# Patient Record
Sex: Female | Born: 1965 | Race: White | Hispanic: No | Marital: Married | Smoking: Current every day smoker
Health system: Southern US, Community
[De-identification: ages and names within clinical notes are randomized; demographics above are authoritative.]

## PROBLEM LIST (undated history)

## (undated) DIAGNOSIS — J45909 Unspecified asthma, uncomplicated: Secondary | ICD-10-CM

## (undated) DIAGNOSIS — M542 Cervicalgia: Secondary | ICD-10-CM

## (undated) DIAGNOSIS — Z8616 Personal history of COVID-19: Secondary | ICD-10-CM

## (undated) DIAGNOSIS — I10 Essential (primary) hypertension: Secondary | ICD-10-CM

## (undated) DIAGNOSIS — G4730 Sleep apnea, unspecified: Secondary | ICD-10-CM

## (undated) DIAGNOSIS — I509 Heart failure, unspecified: Secondary | ICD-10-CM

## (undated) DIAGNOSIS — G8929 Other chronic pain: Secondary | ICD-10-CM

## (undated) DIAGNOSIS — R519 Headache, unspecified: Secondary | ICD-10-CM

## (undated) DIAGNOSIS — K219 Gastro-esophageal reflux disease without esophagitis: Secondary | ICD-10-CM

## (undated) DIAGNOSIS — Z9981 Dependence on supplemental oxygen: Secondary | ICD-10-CM

## (undated) DIAGNOSIS — S71002A Unspecified open wound, left hip, initial encounter: Secondary | ICD-10-CM

## (undated) DIAGNOSIS — J449 Chronic obstructive pulmonary disease, unspecified: Secondary | ICD-10-CM

## (undated) DIAGNOSIS — R0602 Shortness of breath: Secondary | ICD-10-CM

## (undated) HISTORY — PX: COLONOSCOPY: SHX174

## (undated) HISTORY — DX: Headache, unspecified: R51.9

## (undated) HISTORY — DX: Personal history of COVID-19: Z86.16

## (undated) HISTORY — DX: Other chronic pain: G89.29

## (undated) HISTORY — DX: Cervicalgia: M54.2

---

## 1993-09-09 HISTORY — PX: TUBAL LIGATION: SHX77

## 2000-12-28 ENCOUNTER — Emergency Department (HOSPITAL_COMMUNITY): Admission: EM | Admit: 2000-12-28 | Discharge: 2000-12-28 | Payer: Self-pay | Admitting: Emergency Medicine

## 2000-12-28 ENCOUNTER — Encounter: Payer: Self-pay | Admitting: Emergency Medicine

## 2001-01-01 ENCOUNTER — Encounter: Payer: Self-pay | Admitting: Emergency Medicine

## 2001-01-01 ENCOUNTER — Emergency Department (HOSPITAL_COMMUNITY): Admission: EM | Admit: 2001-01-01 | Discharge: 2001-01-01 | Payer: Self-pay | Admitting: Emergency Medicine

## 2001-04-15 ENCOUNTER — Other Ambulatory Visit: Admission: RE | Admit: 2001-04-15 | Discharge: 2001-04-15 | Payer: Self-pay | Admitting: Obstetrics and Gynecology

## 2001-04-30 ENCOUNTER — Encounter: Payer: Self-pay | Admitting: General Surgery

## 2001-04-30 ENCOUNTER — Encounter: Admission: RE | Admit: 2001-04-30 | Discharge: 2001-04-30 | Payer: Self-pay | Admitting: General Surgery

## 2002-04-26 ENCOUNTER — Other Ambulatory Visit: Admission: RE | Admit: 2002-04-26 | Discharge: 2002-04-26 | Payer: Self-pay | Admitting: Obstetrics and Gynecology

## 2002-04-30 ENCOUNTER — Encounter: Payer: Self-pay | Admitting: Obstetrics and Gynecology

## 2002-04-30 ENCOUNTER — Encounter: Admission: RE | Admit: 2002-04-30 | Discharge: 2002-04-30 | Payer: Self-pay | Admitting: Obstetrics and Gynecology

## 2002-05-31 ENCOUNTER — Encounter (INDEPENDENT_AMBULATORY_CARE_PROVIDER_SITE_OTHER): Payer: Self-pay | Admitting: Specialist

## 2002-05-31 ENCOUNTER — Inpatient Hospital Stay (HOSPITAL_COMMUNITY): Admission: RE | Admit: 2002-05-31 | Discharge: 2002-06-02 | Payer: Self-pay | Admitting: Obstetrics and Gynecology

## 2002-06-09 HISTORY — PX: TOTAL ABDOMINAL HYSTERECTOMY: SHX209

## 2003-01-27 ENCOUNTER — Encounter: Admission: RE | Admit: 2003-01-27 | Discharge: 2003-01-27 | Payer: Self-pay | Admitting: Obstetrics and Gynecology

## 2003-01-27 ENCOUNTER — Encounter: Payer: Self-pay | Admitting: Obstetrics and Gynecology

## 2004-05-10 HISTORY — PX: NECK SURGERY: SHX720

## 2005-04-18 ENCOUNTER — Inpatient Hospital Stay (HOSPITAL_COMMUNITY): Admission: RE | Admit: 2005-04-18 | Discharge: 2005-04-20 | Payer: Self-pay | Admitting: Specialist

## 2005-07-18 ENCOUNTER — Encounter: Admission: RE | Admit: 2005-07-18 | Discharge: 2005-07-18 | Payer: Self-pay | Admitting: Specialist

## 2005-08-29 IMAGING — RF DG CERVICAL SPINE 2 OR 3 VIEWS
1 series · 3 of 3 positions shown · non-contrast
Comparison: none

CLINICAL DATA: Neck pain.
 CERVICAL [id number] VIEWS:
 Intraoperative lateral films reveal plate and screw fixation from C5 through C7.  Alignment appears satisfactory.  There is no gross adverse feature.  Recommend formal cervical spine series postoperatively.

[Series 1: run · 3 of 3 slices shown]
[im 1/3]
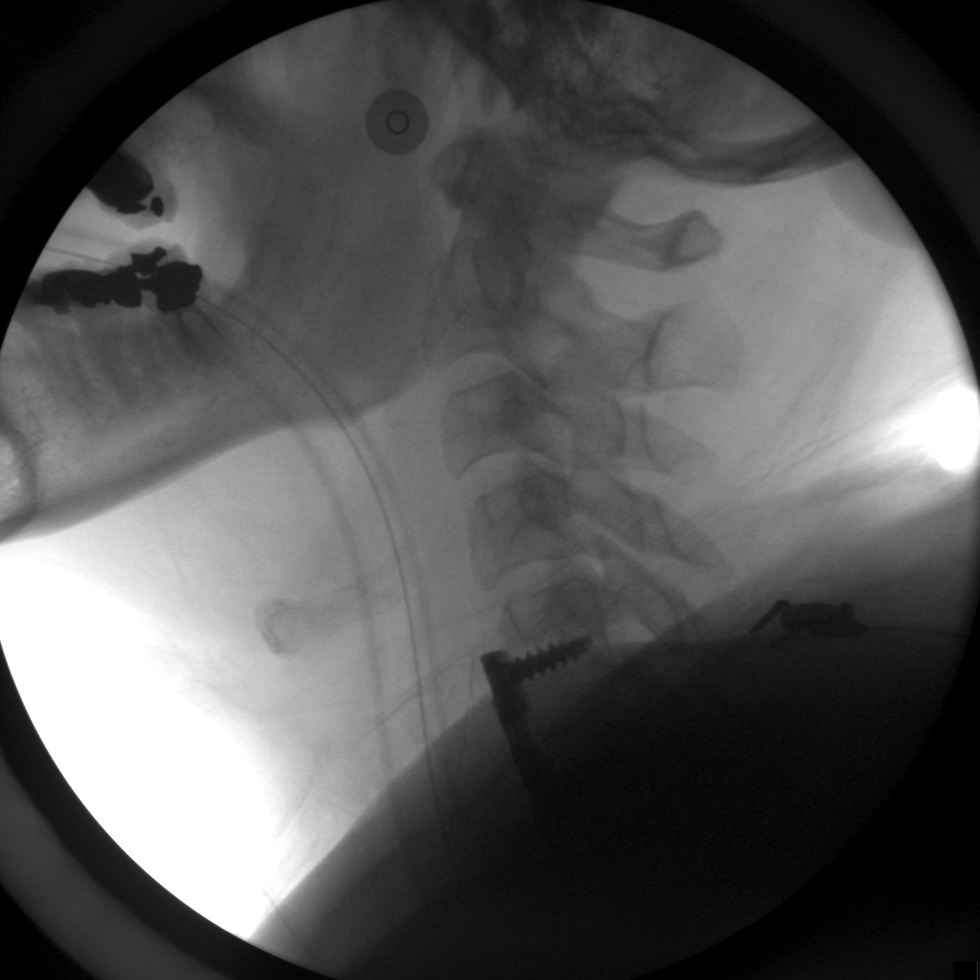
[im 2/3]
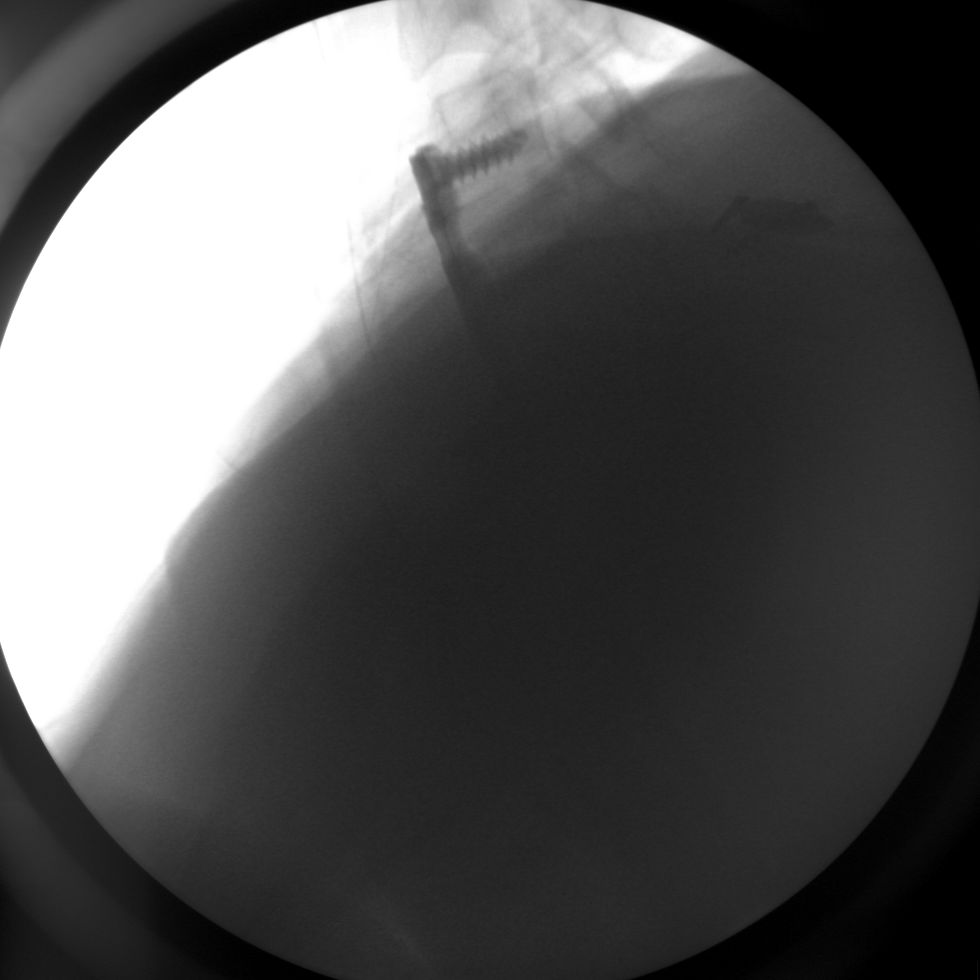
[im 3/3]
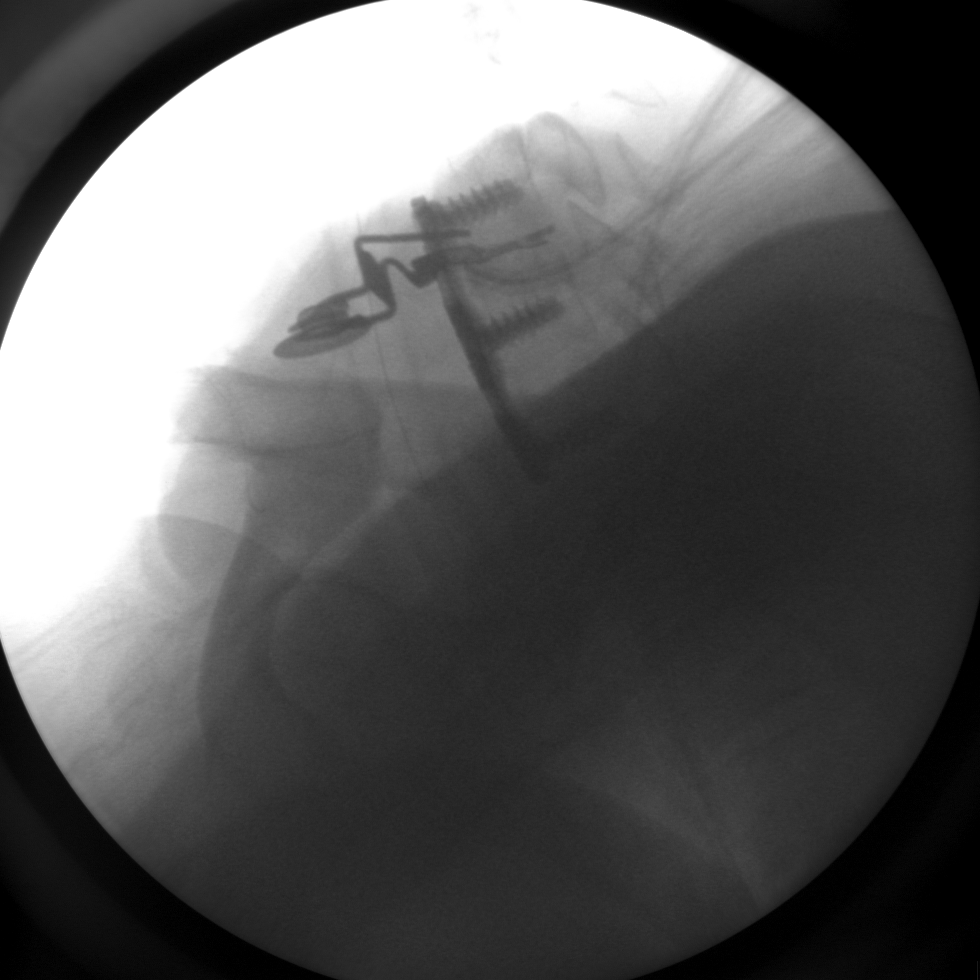

[3 of 3 positions shown; findings below may reference images not displayed]

IMPRESSION: C5-C7 fusion.

## 2006-06-06 ENCOUNTER — Encounter: Admission: RE | Admit: 2006-06-06 | Discharge: 2006-06-06 | Payer: Self-pay | Admitting: Family Medicine

## 2007-10-07 ENCOUNTER — Emergency Department (HOSPITAL_COMMUNITY): Admission: EM | Admit: 2007-10-07 | Discharge: 2007-10-07 | Payer: Self-pay | Admitting: Family Medicine

## 2010-04-30 ENCOUNTER — Encounter: Payer: Self-pay | Admitting: Pulmonary Disease

## 2010-05-09 ENCOUNTER — Ambulatory Visit: Payer: Self-pay | Admitting: Pulmonary Disease

## 2010-05-09 DIAGNOSIS — R0989 Other specified symptoms and signs involving the circulatory and respiratory systems: Secondary | ICD-10-CM

## 2010-05-09 DIAGNOSIS — R0609 Other forms of dyspnea: Secondary | ICD-10-CM

## 2010-05-09 DIAGNOSIS — J309 Allergic rhinitis, unspecified: Secondary | ICD-10-CM | POA: Insufficient documentation

## 2010-05-31 ENCOUNTER — Encounter: Payer: Self-pay | Admitting: Pulmonary Disease

## 2010-05-31 ENCOUNTER — Ambulatory Visit: Payer: Self-pay | Admitting: Pulmonary Disease

## 2010-05-31 DIAGNOSIS — J441 Chronic obstructive pulmonary disease with (acute) exacerbation: Secondary | ICD-10-CM

## 2010-10-09 NOTE — Miscellaneous (Signed)
Summary: Orders Update pft charges  Clinical Lists Changes  Orders: Added new Service order of Carbon Monoxide diffusing w/capacity (94720) - Signed Added new Service order of Lung Volumes (94240) - Signed Added new Service order of Spirometry (Pre & Post) (94060) - Signed 

## 2010-10-09 NOTE — Assessment & Plan Note (Signed)
Summary: f/u for emphysema   Copy to:  Antony Haste Primary Provider/Referring Provider:  Antony Haste  CC:  Pt here to discuss pft results and follow up on breathing. Pt states her breathing is the same. pt states she is till giving out of breath. pt c/o of productive cough in the mornings with clear phlem. Pt states she is currently smoking 1ppd. .  History of Present Illness: the pt comes in today for review of her pfts.  She was found to have moderate to severe airflow obstruction, but did have a 16%improvement with bronchodilators.  She had definite airtrapping, no restriction, and a moderate reduction in DLCO.  I have reviewed the results with her in detail, and answered all of her questions.  Unfortunately, she is still smoking.  Preventive Screening-Counseling & Management  Alcohol-Tobacco     Smoking Status: current     Smoking Cessation Counseling: yes     Packs/Day: 1.0     Tobacco Counseling: to quit use of tobacco products  Current Medications (verified): 1)  Hydrocodone-Acetaminophen 7.5-750 Mg Tabs (Hydrocodone-Acetaminophen) .Marland Kitchen.. 1 Tablet Every 4-6 Hrs As Needed 2)  Amoxicillin 500 Mg Caps (Amoxicillin) .... One Tablet Three Times A Day X7 Days  Allergies (verified): 1)  ! * Avelox 2)  ! Erythromycin  Review of Systems       The patient complains of shortness of breath with activity, productive cough, non-productive cough, acid heartburn, indigestion, and nasal congestion/difficulty breathing through nose.  The patient denies shortness of breath at rest, coughing up blood, chest pain, irregular heartbeats, loss of appetite, weight change, abdominal pain, difficulty swallowing, sore throat, tooth/dental problems, headaches, sneezing, itching, ear ache, anxiety, depression, hand/feet swelling, joint stiffness or pain, rash, change in color of mucus, and fever.    Vital Signs:  Patient profile:   45 year old female Height:      63 inches Weight:      202.38  pounds O2 Sat:      95 % on Room air Temp:     98.1 degrees F oral Pulse rate:   91 / minute BP sitting:   116 / 72  (left arm) Cuff size:   large  Vitals Entered By: Carver Fila (May 31, 2010 2:15 PM)  O2 Flow:  Room air CC: Pt here to discuss pft results and follow up on breathing. Pt states her breathing is the same. pt states she is till giving out of breath. pt c/o of productive cough in the mornings with clear phlem. Pt states she is currently smoking 1ppd.  Comments meds and allergies updated Phone number updated Carver Fila  May 31, 2010 2:17 PM    Physical Exam  General:  obese female in nad Lungs:  mildly decreased bs , no wheezing or rhonchi Heart:  rrr,no mrg Extremities:  no edema or cyanosis Neurologic:  alert and oriented, moves all 4.   Impression & Recommendations:  Problem # 1:  EMPHYSEMA (ICD-492.8) the pt has moderate to severe airflow obstruction by pfts, with a positive response to bronchodilators.  I suspect she has at least moderate emphysema, but would see significant improvement with smoking cessaton and a bronchodilator regimen.  I have had a long discussion with her about emphysema, and given her young age would like to check AAT level to be complete.  Will start with symbicort for treatment given her ongoing smoking and probable airway inflammation.  We have also discussed smoking cessation, and some of the various behavioral and  pharmacological treatments available.    Medications Added to Medication List This Visit: 1)  Hydrocodone-acetaminophen 7.5-750 Mg Tabs (Hydrocodone-acetaminophen) .Marland Kitchen.. 1 tablet every 4-6 hrs as needed 2)  Amoxicillin 500 Mg Caps (Amoxicillin) .... One tablet three times a day x7 days 3)  Symbicort 160-4.5 Mcg/act Aero (Budesonide-formoterol fumarate) .... Two puffs twice daily 4)  Proair Hfa 108 (90 Base) Mcg/act Aers (Albuterol sulfate) .... 2 puffs every 4-6 hours as needed  Other Orders: Est. Patient Level III  (54270) T- * Misc. Laboratory test [id number]) Tobacco use cessation intermediate 3-10 minutes (28315)  Patient Instructions: 1)  stop smoking!! 2)  start symbicort 160/4.5  2 inhalations am and pm...rinse mouth well. 3)  albuterol inhaler 2 inhalations up to every 6hrs if needed for worsening breathing...just use for rescue. 4)  will check bloodwork today for inherited form of emphysema. 5)  followup with me in 3 mos  Prescriptions: PROAIR HFA 108 (90 BASE) MCG/ACT  AERS (ALBUTEROL SULFATE) 2 puffs every 4-6 hours as needed  #1 x 6   Entered and Authorized by:   Barbaraann Share MD   Signed by:   Barbaraann Share MD on 05/31/2010   Method used:   Print then Give to Patient   RxID:   [id number] SYMBICORT 160-4.5 MCG/ACT  AERO (BUDESONIDE-FORMOTEROL FUMARATE) Two puffs twice daily  #1 x 6   Entered and Authorized by:   Barbaraann Share MD   Signed by:   Barbaraann Share MD on 05/31/2010   Method used:   Print then Give to Patient   RxID:   [id number]

## 2010-10-09 NOTE — Assessment & Plan Note (Signed)
Summary: consult for [name]   Copy to:  Antony Haste Primary Provider/Referring Provider:  Antony Haste  CC:  Pulmonary Consult.  History of Present Illness: The pt is a 45y/o female who I have been asked to see for dyspnea.  The pt c/o [name] for over a year, but feels that it has been stable and not progressive.  She will get winded walking up a flight of stairs, and also notes [name] at one block at moderate pace on flat ground.  She will also get winded bringing groceries in from the car, and doing light housework.  She has no issues with cough or congestion unless she gets "sick", but did recently have a URI for which she received abx and prednisone.  Unfortunately, she is a lifelong smoker, and currently smokes one ppd.  She does have the typical early am smoker's cough.  She also has gained 35 pounds over the last 2 years.  She has no known cardiac disease.  Preventive Screening-Counseling & Management  Alcohol-Tobacco     Smoking Status: current     Smoking Cessation Counseling: yes     Packs/Day: 1.0     Tobacco Counseling: to quit use of tobacco products  Comments: pt has tried patches, chantix (couldn't afford).  Current Medications (verified): 1)  Hydromet 5-1.5 Mg/27ml Syrp (Hydrocodone-Homatropine) .... Take By Mouth As Needed For Cough  Allergies (verified): 1)  ! * Avelox 2)  ! Erythromycin  Past History:  Past Medical History:   ALLERGIC RHINITIS (ICD-477.9)    Past Surgical History: neck surgery Sept 2005 hysterectomy Oct 2003 tubal ligation approx 1995   Family History: Reviewed history and no changes required. emphysema: father (COPD) mother (COPD) rheumatism: father cancer: father (lung)  Social History: Reviewed history and no changes required. Patient is a current smoker.  started at age 69.  1 ppd.  pt is married and lives with husband, dennis. pt has children. pt is a housewife.  Smoking Status:  current Packs/Day:  1.0  Review of Systems       The patient complains of shortness of breath with activity, productive cough, and non-productive cough.  The patient denies shortness of breath at rest, coughing up blood, chest pain, irregular heartbeats, acid heartburn, indigestion, loss of appetite, weight change, abdominal pain, difficulty swallowing, sore throat, tooth/dental problems, headaches, nasal congestion/difficulty breathing through nose, sneezing, itching, ear ache, anxiety, depression, hand/feet swelling, joint stiffness or pain, rash, change in color of mucus, and fever.    Vital Signs:  Patient profile:   45 year old female Height:      63 inches Weight:      202.50 pounds BMI:     36.00 O2 Sat:      95 % on Room air Temp:     98.3 degrees F oral Pulse rate:   97 / minute BP sitting:   114 / 80  (left arm) Cuff size:   regular  Vitals Entered By: Arman Filter LPN (May 09, 2010 11:14 AM)  O2 Flow:  Room air CC: Pulmonary Consult Comments Medications reviewed with patient Arman Filter LPN  May 09, 2010 11:14 AM    Physical Exam  General:  obese female in nad Eyes:  PERRLA and EOMI.   Nose:  patent without discharge Mouth:  clear, elongation of soft palate and uvula Neck:  no jvd, tmg, LN Lungs:  mildly decreased bs, no wheezing or rhonchi Heart:  rrr, no mrg Abdomen:  soft and nontender, bs+ Extremities:  no edema noted, no cyanosis  pulses intact distally Neurologic:  alert and oriented, moves all 4.   Impression & Recommendations:  Problem # 1:  DYSPNEA ON EXERTION (ICD-786.09) the pt is having [name] that I suspect is multifactorial.  She is obese and deconditioned, and has gained 35 pounds in the last 2 years.  She also has a long h/o tobacco abuse, and the question is raised whether she may have chronic obstructive disease that may benefit from treatment with bronchodilators.  I suspect she has ongoing airway inflammation related to her tobacco abuse, and not chronic obstructive disease.  But,   will not know until pfts are done.  I have counseled her on smoking cessation, and will see her back after her pfts.  I have asked her to consider chantix again, and reminded her that it is priced at exactly the cost of one pack a day of cigarettes.    Medications Added to Medication List This Visit: 1)  Hydromet 5-1.5 Mg/21ml Syrp (Hydrocodone-homatropine) .... Take by mouth as needed for cough  Other Orders: Consultation Level IV (16109) Pulmonary Referral (Pulmonary) Tobacco use cessation intermediate 3-10 minutes (60454)  Patient Instructions: 1)  stop smoking 2)  will schedule for breathing studies, and see me the same day to review.

## 2011-01-25 NOTE — Op Note (Signed)
NAME:  [name], [name]                          ACCOUNT NO.:  [id number]   MEDICAL RECORD NO.:  [id number]                   PATIENT TYPE:  INP   LOCATION:  9303                                 FACILITY:  [facility]   PHYSICIAN:  Malva Limes, MD                   DATE OF BIRTH:  [birth year]   DATE OF PROCEDURE:  05/31/2002  DATE OF DISCHARGE:                                 OPERATIVE REPORT   PREOPERATIVE DIAGNOSES:  1. Menometrorrhagia.  2. Dysmenorrhea.  3. Dyspareunia.  4. Pelvic pain.   POSTOPERATIVE DIAGNOSES:  1. Menometrorrhagia.  2. Dysmenorrhea.  3. Dyspareunia.  4. Pelvic pain.   PROCEDURE:  1. Diagnostic laparoscopy.  2. Total vaginal hysterectomy.   SURGEON:  Malva Limes, MD   ASSISTANT:  Luvenia Redden, M.D.   ANESTHESIA:  General endotracheal.   ANTIBIOTICS:  Ancef 1 g.   COMPLICATIONS:  None.   SPECIMENS:  Cervix and uterus sent to pathology.   ESTIMATED BLOOD LOSS:  150 cc.   DRAINS:  Foley to bed side drainage.   FINDINGS:  The patient had a normal appearing pelvis.  There was no evidence  of any endometriosis or pelvic adhesions.  The patient did have evidence of  past tubal ligation with the Falope ring.  Ovaries normal bilaterally.  There was one small adhesion to the left ovary which was taken down with  blunt dissection.   PROCEDURE:  The patient was taken to the operating room where she was placed  in the dorsal supine position.  A general anesthetic was administered  without complications.  She was then placed in the dorsal lithotomy  position.  She was prepped with Hibiclens and her bladder was drained with a  red rubber catheter.  She was then draped in the usual fashion for this  procedure.  A Hulka tenaculum was applied to the anterior cervical lip.  The  umbilicus was then injected with 0.25% Marcaine.  A vertical skin incision  was made.  This was carried down to the fascia.  The fascia was grasped with  Allis clamps, incised with  the scissors.  Parietoperitoneum was entered with  blunt dissection.  Sutures were placed in the fascia and the Hasson placed  in the abdominal cavity.  Carbon dioxide 3 L was insufflated.  The patient  was then placed in Trendelenburg.  A 5 mm port was placed in the suprapubic  region under direct visualization.  Examination of the abdominopelvic  contents appeared to have all normal findings.  Once the laparoscopy was  concluded the pneumoperitoneum was released and instruments removed.  Attention was then turned to the vagina where a weighted speculum was placed  in the vagina.  The cervix was injected with 1% lidocaine with epinephrine.  The posterior cul-de-sac was entered with sharp dissection.  The uterosacral  ligaments were bilaterally clamped, cut, and ligated with 2-0  Monocryl  suture.  The cervix was then circumscribed.  The bladder pillars were  bilaterally clamped, cut, and ligated with 0 Monocryl suture.  The cardinal  ligaments were bilaterally clamped, cut, and ligated with 0 Monocryl suture.  The anterior cul-de-sac was entered with sharp dissection.  The uterine  vessels were then bilaterally clamped, cut, and ligated with 0 Monocryl  suture.  Once the level of the round ligament was reached the uterus was  inverted and the triple pedicle which included the ovarian ligament,  fallopian tube, and round ligament were bilaterally clamped, cut, and the  specimen excised.  This pedicle was doubly ligated with 0 Monocryl suture.  Next, all pedicles were checked and felt to be hemostatic.  Following this  the uterosacral ligaments were reapproximated in the midline using 0  Monocryl suture.  The vaginal cuff and parietoperitoneum were then closed  using 2-0 Vicryl suture in the vertical running locking fashion.  Following  this, the abdomen was insufflated again with carbon dioxide.  The scope was  placed and pedicles all checked and felt to be hemostatic.  At this point  the  instruments were removed, pneumoperitoneum released.  The fascia was  closed with 0 Vicryl suture.  Skin was closed with 4-0 Vicryl suture.  The  patient tolerated procedure well.  She was taken to recovery room in stable  condition.  Instrument and lap counts were correct x2.                                               Malva Limes, MD    MA/MEDQ  D:  05/31/2002  T:  05/31/2002  Job:  [id number]

## 2011-01-25 NOTE — Op Note (Signed)
[name], [name] NO.:  [id number]   MEDICAL RECORD NO.:  [id number]          PATIENT TYPE:  INP   LOCATION:  2854                         FACILITY:  [facility]   PHYSICIAN:  Kerrin Champagne, M.D.   DATE OF BIRTH:  14-Jun-1966   DATE OF PROCEDURE:  04/18/2005  DATE OF DISCHARGE:                                 OPERATIVE REPORT   PREOPERATIVE DIAGNOSIS:  Herniated nucleus pulposus, central C5-6 and  herniated nucleus pulposus central left-sided C6-7.   POSTOPERATIVE DIAGNOSIS:  Degenerative disk disease C5-6 and C6-7,  spondylitic spur left side C6-7 with spurring off posterior lateral corner  of the disk space causing some left-sided C7 nerve root compression. At the  C5-6 level central disk protrusion. There was also evidence of left C6 nerve  root compression secondary to combination of disk material cartilage within  the left C6 neural foramen.   PROCEDURE:  Anterior cervical diskectomy and fusion of the C5-6 and C6-7  level using right iliac crest bone graft harvested through a separate  incision. 45 mm two level DePuy locking plate with 13 and 14 mm screws.   SURGEON:  Dr. Vira Browns.   ASSISTANT:  Maud Deed, PAC.   ANESTHESIA:  GOT Dr. Diamantina Monks.   ESTIMATED BLOOD LOSS:  50 mL.   COMPLICATIONS:  None.   DRAINS:  TLS drain anterior neck 10-French, Foley catheter straight drain  during the case was removed prior to the patient being discharged to the  recovery room.  The patient was returned to recovery room in good condition.   INTRAOPERATIVE FINDINGS:  As above.   BRIEF CLINICAL HISTORY:  This patient is a 45 year old female who has been  experiencing neck pain with radiation left arm since February of this year.  The pain is along the neck and the jaw area, then into the left shoulder.  She underwent initial attempts at conservative management eventually  underwent MRI studies which demonstrated a herniated nucleus pulposus left  side at  C6-7 and central at the C5-6 level. The cord was felt to be  performed of both levels.  Duriong attempts at management using McKenzie  exercise program, cervical traction over a period of nearly four months the  patient has persisted with arm pain, numbness, weakness in her left arm. She  is brought to the operating room to undergo a anterior diskectomy and fusion  at both C5-6 and C6-7. The patient is continuing to require narcotic  medications even as long as four months following her surgery.   DESCRIPTION OF PROCEDURE:  After adequate general anesthesia, the patient in  a supine position, small bump between the shoulder blades, neck in slight  extension with a five pounds cervical halter traction, the head on the  [place] horseshoe well padded, a bump under the right buttock, the right  iliac crest and anterior neck prepped with DuraPrep solution and draped in  the usual manner, iodine Vi-drape was used. All pressure points were well-  padded. The shoulders and arms held on the table with the skids provided  padding the arms appropriately.  The legs with TED stockings try and prevent  deep venous thrombosis. Also keeping the heels off the bed. Following  draping in the usual manner, the incision along the left side of the neck  about two fingerbreadths above the sternum in line with the patient's skin  creases were marked prior to prep with surgical marking pen. Incision  approximately 4.5 to 5 cm in length in line with the patient's skin creases  through skin and subcutaneous layers down to the platysma layer which was  incised in line with the skin incision. Small subcu vein medially was  preserved. Interval between the trachea and esophagus medially and carotid  sheath laterally was then developed. Omohyoid muscle identified was not  resected, instead retracted superiorly during procedure. Blunt dissection in  the interval between the trachea esophagus medially, carotid sheath  lateral  then developed to the anterior aspect of cervical spine. Handheld Cloward  was inserted.  All venous bleeder cauterized over the medial aspect of the  incision. The prevertebral fascia teased across the midline after incising  with electrocautery. Palpated carotid tuberosity then a spinal needle then  placed at the expected C5-6 level and below the C6-7 level. Intraoperative  plain radiograph demonstrating the C5-6 level with the needle in place C6-7  level directly beneath this felt to be well identified. Under direct  visualization using handheld Clowards then the small portion of disk was  excised from the anterior aspect of disk space at the C5-6 level and then  continuing distally removing the needle at C6-7 small portion of the disk  was excised here using a 15 blade scalpel pituitary. Handheld Cloward then  used carefully retract esophagus trachea. The medial border longus colli  muscle was carefully elevated off of the anterior aspect of cervical spine  anterior longitudinal ligament to the spine carefully debrided at the C5, C6  and C7 levels. A Boss McCullough retractor then inserted with the blades  beneath the medial border longus colli muscle at the C6-7 level. 14 mm screw  posts were then inserted the vertebral body of C6 and C7. Distraction  obtained across disk space. 15 blade scalpel used to further incise the  anterior annulus and pituitaries were then used debride disk material along  with two #24 straight micro curettes. End plates were debrided of  cartilaginous endplate material as well as degenerative disk material back  to the posterior annulus. Operating room microscope draped brought into  field, under direct visualization posterior annular fibers were resected as  was posterior longitudinal ligament.  Osteophytes noted over the left  posterior margin of the disk extending into the neural foramen affecting left C7 nerve were resected until the nerve root  was observed to be exiting  proceeding anteriorly. It was felt to be well decompressed. Endplates were  then carefully further decorticated using high-speed bur. Height of the  intervertebral disk space measured using a bone wax coated cottonoid string  at about 8 mm. The depth of Cloward depth gauge to 15 mm. Incision was made  over the right iliac crest while the radiographs were being obtained for  localization of the disk at the beginning of procedure.  Approximately 6 cm  incision was made directly over the right iliac crest about two  fingerbreadths back from the anterior superior iliac spine.  Incision  through skin and subcu layers directly down to the anterior lateral aspect  iliac crest. This was incised into the periosteum. Periosteal elevation  carried superiorly and  over the medial aspect of the crest and laterally as  well. Bleeders controlled using electrocautery. With Cobb over the medial  aspect of the crest and the [employer] laterally then 8 mm dual oscillating  saw was used to incise the crest biting its base with a curved quarter inch  osteotome, removing a horseshoe like tricortical graft. Graft 8 mm in  height, depth of nearly 13-14 mm was carefully smoothed and trimmed and  carefully curved to key into the disk space appropriately. The graft was  tapered dimensions intervertebral disk space.  When it was well shaped then  the intervertebral disk space hemostased using thrombin-soaked Gelfoam. The  spinal canal felt to be well decompressed both centrally and to the left  side right side. No further bone material or disk material remaining within  disk space could be retropulsed with graft insertion.  The graft was then  inserted impacted into place without difficulty subsetting it 1 mm beneath  the anterior cortex. The screw posts was then removed at C7 level. Bone wax  applied to bleeding screw post hole here. The [employer] retractor was  completely removed and  then replaced at C5-6 level superiorly the foot of  the blade beneath the medial border of longus colli muscle and care taken to  ensure that was not impinging on the esophagus medially. Distraction  obtained here. The screw post removed from C7 was then replaced at the C5  level using parallel guide provided. Distraction obtained across the disk  space and 15 blade scalpel used to incise the anterior annular fibers.  Disk  was then debrided  disk material as well as cartilaginous endplates inferior  aspect of C5 superior aspect of C6 using a #2 micro curette as well as  pituitary rongeurs.   The disk space was debrided back to the posterior longitudinal ligament.  This was resected using 1 and 2 mm Kerrisons debriding posterior lip  osteophytes continuing out to the left neural foramen at C6 where disk  material was noted be extruding into the neural foramen affecting the left C6 nerve root. This had a consistency of both cartilage and disk material.  It was excised using 2-3 mm Kerrisons to the left C6 nerve root was felt to  be completely free then the neural foramen. With this central portion of the  canal well debrided, of any disk material remaining the posterior  longitudinal ligament was resected to ensure that all disk material had been  completely removed. Height of the intervertebral disk space again measuring  about 80 mm, the depth of 15 mm. Additional graft was then obtained for the  right iliac crest carefully protecting medial soft tissue structures.  This  graft was obtained more posterior on the crest using a dual oscillating saw  to 8 mm. Cut across its base. Graft was then carefully tapered to dimensions  of intervertebral disk space height of 8 mm depth of approximately 13-14 mm.  Graft carefully contoured to the dimensions intervertebral disk space.  Irrigation obtained over the disk space and careful inspection demonstrated  no soft tissue remaining within the disk  space could be retropulsed with  insertion of the graft.  The endplates were carefully further decorticated  using high-speed bur and irrigation performed. Hemostasis with thrombin  soaked Gelfoam. Graft was then inserted in place and packed into place  without difficulty. Subset at 1 mm beneath the anterior cortex of the disk  space. Screw posts were then removed at  both the C5 and C6 level. Carefully  the anterior lip osteophytes at both C5-6 and C6-7 were carefully debrided  using a high-speed bur. The expected length of plate was carefully obtained  using bone wax coated cottonoid string measured at approximately 45-47 mm.  47 mm plate felt to be too long so 45 was chosen. A 5 mm longitudinal  traction was released.  Plate then carefully positioned over the anterior  left side of the cervical spine and pinned into place superiorly using  retaining pin. This was placed well beneath the expected C4-5 disk space  superiorly so that no impingement would be present. The plate was carefully  aligned. 13 mm screws were then placed at the C6 level initially, fixing the  plate on the left side using a 14 mm drill to drill hole and then place a 13  mm screw.  Two 14 mm screws were then placed at the C7 level drilling first  left side placing screw and then the right side. These obtained excellent  purchase. The 13 mm screw was then placed at the C5-C6 level on the right  side. This was done by first drilling 14 mm screw hole and then placing the  13 mm screw without difficulty. Retaining pin was then removed at the C5  level. And then 13 mm screws were placed at the right side and left side C5  level. Each of the locking screws were then carefully turned after first  ensuring that all screws were well seated within the screw holes within the  plate. Turning the screws locking the screws to the plate without difficulty each of the screws returned least 90 degrees. Intraoperative C-arm fluoro  then  used to ascertain position alignment plate and screws. No evidence  retropulsion of graft. Screws appeared to be correct length, no impingement  on the spinal canal. Overall position alignment of plates and screws and  fusion appeared to be quite good. Irrigation was then performed the incision  in the anterior neck. Careful inspection of the esophagus demonstrated no  abnormalities. There was no active bleeding present. A 10-French TLS drain  was then placed in the depth of the incision exiting over the anterior  aspect of cervical spine, right side of the incision.  Platysma layer  reapproximated with interrupted 3-0 Vicryl sutures.  Deep subcu layers  approximated interrupted 3-0 Vicryl sutures and skin closed with a running  subcu stitch of 4-0 Vicryl. TLS drain was sewn in place with a 4-0 nylon  suture.  TLS drain was then charged.  Right iliac crest bone graft harvest  site carefully hemostased using bone wax and Gelfoam. No bleeding was  apparent.  Then the periosteum reapproximated over the iliac crest #1 Vicryl  suture.  Deep subcu layers approximated with interrupted #1-0 Vicryl  sutures, more subcu layers of interrupted 3-0 Vicryl sutures and skin closed  with a running subcu stitch of 4-0 Vicryl.  Tincture benzoin Steri-Strips  applied, 4x4s fixed to the skin over the right iliac crest as well as over  the anterior neck, Hypafix tape to the skin. A Philadelphia collar applied.  The patient was then returned her bed following removal of Foley catheter  that had been placed the beginning of the case. The patient was then  reactivated, extubated, returned recovery room in satisfactory condition.  The C-arm images were obtained and were kept in the patient's chart. The  patient was then returned recovery room in satisfactory condition. Also  sponge counts were correct.      Kerrin Champagne, M.D.  Electronically Signed    JEN/MEDQ  D:  04/18/2005  T:  04/19/2005  Job:  [id number]

## 2011-01-25 NOTE — Discharge Summary (Signed)
NAMEMELLODY, MASRI NO.:  [id number]   MEDICAL RECORD NO.:  [id number]          PATIENT TYPE:  INP   LOCATION:  3031                         FACILITY:  [facility]   PHYSICIAN:  Kerrin Champagne, M.D.   DATE OF BIRTH:  [birth year]   DATE OF ADMISSION:  04/18/2005  DATE OF DISCHARGE:  04/20/2005                                 DISCHARGE SUMMARY   ADMISSION DIAGNOSES:  1.  Herniated nucleus pulposus cervical vertebrae-5-6 central and herniated      nucleus pulposus left-sided and central cervical vertebrae-6-7.  2.  Tobacco abuse.   DISCHARGE DIAGNOSIS:  1.  Degenerative disk disease cervical vertebrae-5-6 and cervical vertebrae-      6-7, spondylitic spur left side cervical vertebrae-6-7 with spurring off      posterolateral corner of the disk space causing left-sided cervical      vertebrae-7 nerve root compression. At the cervical vertebrae-5-6 level      central disk protrusion. Also evidence of left cervical vertebrae-6      nerve root compression secondary to combination of disk material and      cartilage within the left cervical vertebrae-6 neural foramen.  2.  Tobacco abuse.   PROCEDURE:  On April 18, 2005, the patient underwent anterior cervical  diskectomy and fusion C5-6 and C6-7 using right iliac crest bone graft  harvested through a separate fascial incision performed by Dr. Otelia Sergeant  assisted by Maud Deed P.A.C. under general anesthesia.   COMPLICATIONS:  None.   CONSULTATIONS:  None.   BRIEF HISTORY:  The patient is a 45 year old female with neck pain and left  arm pain for several months. Conservative management has not relieve her  discomfort. MRI studies demonstrated HNP left C6-7 and central at C5-6  level. It was felt she would require surgical intervention for definitive  treatment and was admitted for the procedure as stated above.   BRIEF HOSPITAL COURSE:  The patient tolerated the procedure under general  anesthesia without  complications. Postoperatively, neurovascular motor  function of the upper extremities was noted to be intact. The patient  required IV analgesics, weaning to p.o. analgesics for discomfort. Drain was  removed on the first postoperative day and dressing changes daily thereafter  showed no signs of infection. The patient was afebrile and vital signs were  stable through the hospital stay. She received physical therapy for  ambulation and gait training as well as occupational therapy for ADLs. Aspen  collar was used at all times with instructions to use Philadelphia collar  for showering. The patient was voiding well, had positive flatus and no  nausea or vomiting. She was discharged on April 20, 2005 under stable  condition.   PERTINENT LABORATORY VALUES:  On admission, CBC within normal limits.  Hemoglobin 15.4, hematocrit 45.1 on admission. Chemistry studies normal on  admission. Coagulation studies normal on admission. Urinalysis negative for  urinary tract infection on admission. No chest x-ray or EKG on the chart at  the time of this dictation.   PLAN:  The patient was discharged to her home. She was given prescriptions  for Percocet one to two every four to six hours as needed for pain; Robaxin  500 milligrams one every eight hours as needed for spasm; Protonix 40  milligrams once daily; Ambien 10 milligrams one p.o. q.h.s. p.r.n. sleep.  She was instructed to avoid aspirin, Advil or ibuprofen-type products. She  was also instructed to use over-the-counter stool softeners or laxatives as  needed. No overhead use of her arms. No driving or lifting. She was advised  to bathe five days from the day of surgery if there was no wound drainage.  She will utilize Production manager for showers and Aspen collar at all  other times. The patient was encouraged in smoking cessation. She will  follow up with Dr. Otelia Sergeant two weeks from the date of her discharge. She will  continue on a soft diet  until she has no sore throat and then advance diet  as tolerated. All questions encouraged and answered.   CONDITION ON DISCHARGE:  Stable.      Wende Neighbors, P.A.      Kerrin Champagne, M.D.  Electronically Signed    SMV/MEDQ  D:  07/31/2005  T:  07/31/2005  Job:  [id number]

## 2011-01-25 NOTE — Discharge Summary (Signed)
   NAME:  [name], [name]                          ACCOUNT NO.:  [id number]   MEDICAL RECORD NO.:  [id number]                   PATIENT TYPE:  INP   LOCATION:  9303                                 FACILITY:  [facility]   PHYSICIAN:  Malva Limes, M.D.                 DATE OF BIRTH:  1965-10-10   DATE OF ADMISSION:  05/31/2002  DATE OF DISCHARGE:  06/02/2002                                 DISCHARGE SUMMARY   PRINCIPAL DISCHARGE DIAGNOSES:  1. Menometrorrhagia.  2. Dyspareunia.  3. Pelvic pain.  4. Dysmenorrhea.   PRINCIPAL PROCEDURES:  1. Diagnostic laparoscopy.  2. Total vaginal hysterectomy.   HISTORY OF PRESENT ILLNESS:  The patient is a 45 year old white female G2,  P2 who presented to [facility] on May 31, 2002 for diagnostic  laparoscopy and total vaginal hysterectomy secondary to a several year  history of dysmenorrhea, menometrorrhagia, dyspareunia, and pelvic pain.  Complete description of the events which led up to this hospitalization can  be found in the dictated history and physical.  The patient underwent a  diagnostic laparoscopy followed by a total vaginal hysterectomy May 31, 2002.  The patient had no complications during the surgery.  A complete  description of the procedure can be found in the dictated operative note.  The patient's preoperative hemoglobin was 16.7, postoperative 13.7.  The  patient's pathology was pending at the time of this dictation.  The  patient's postoperative course was uncomplicated.  The patient remained  afebrile.  At the time of discharge she was ambulating without difficulty  __________  regular diet.  The patient had passed flatus prior to discharge.  The patient will be discharged to home.  She will be instructed to follow up  in the office in four weeks.  She was sent home with Tylox to take p.r.n.  She was also instructed to continue taking her home medications.                                               Malva Limes, M.D.    MA/MEDQ  D:  06/02/2002  T:  06/02/2002  Job:  [id number]

## 2011-01-25 NOTE — H&P (Signed)
NAME:  [name], [name]                          ACCOUNT NO.:  [id number]   MEDICAL RECORD NO.:  [id number]                   PATIENT TYPE:  INP   LOCATION:  9399                                 FACILITY:  [facility]   PHYSICIAN:  Malva Limes, MD                   DATE OF BIRTH:  01/28/1966   DATE OF ADMISSION:  05/31/2002  DATE OF DISCHARGE:                                HISTORY & PHYSICAL   HISTORY OF PRESENT ILLNESS:  The patient is a 45 year old white female G2 P2  who presents today for diagnostic laparoscopy followed by total vaginal  hysterectomy.  The patient is having this procedure secondary to a several-  year history of dysmenorrhea, dyspareunia, pelvic pain, and  menometrorrhagia.  The patient states that this has been worsening over the  last six months.  The patient has been unable to take oral contraceptive  pills in an effort to treat this problem secondary to her heavy smoking  history and age.  The patient has taken  Depo-Provera but refuses to take it secondary to side effects.  The patient  has also had an extensive GI workup for pelvic pain.  She has had CT scan,  ultrasound of her liver and gallbladder and pancreas, and colonoscopy by Dr.  Corinda Gubler; this has all been normal.  The patient was given the option of  laparoscopy with cryoablation of the uterus.  She asked for more definitive  therapy and therefore she will undergo a hysterectomy today.   PAST MEDICAL HISTORY:  The patient has had two vaginal births without  complications.  She has also had bilateral tubal ligation.   ALLERGIES:  She has an allergic reaction to ERYTHROMYCIN.   CURRENT MEDICATIONS:  1. Wellbutrin 150 mg p.o. b.i.d.  2. Trazodone 50 mg q.h.s. p.r.n.  3. Lexapro 10 mg q.d.  4. Dynex 90 mg.  5. Guaifenesin p.r.n.   SOCIAL HISTORY:  The patient smokes two packs a day.  She drinks alcohol  occasionally.  She denies any drug use.   FAMILY HISTORY:  Significant only for diabetes.   PHYSICAL EXAMINATION:  GENERAL:  The patient is an overweight white female  in no apparent distress.  HEENT:  Within normal limits.  LUNGS:  Clear to auscultation.  CARDIOVASCULAR:  Reveals a regular rate and rhythm without murmur.  LUNGS:  Reveal scattered wheezes.  BREASTS:  Without masses or tenderness.  There is no lymphadenopathy.  ABDOMEN:  Soft, nontender, nondistended.  There is no organomegaly.  She has  no rebound or guarding.  PELVIC:  Reveals normal external genitalia.  The vagina is without lesions  or discharge.  The cervix is parous.  The uterus is normal size and shape.  There are no adnexal masses.  EXTREMITIES:  Within normal limits.   IMPRESSION:  1. Menometrorrhagia.  2. Dysmenorrhea.  3. Pelvic pain.  4. Dyspareunia.   PLAN:  The patient is to undergo diagnostic laparoscopy followed by total  vaginal hysterectomy.                                               Malva Limes, MD    MA/MEDQ  D:  05/31/2002  T:  05/31/2002  Job:  [id number]

## 2011-03-21 ENCOUNTER — Encounter: Payer: Self-pay | Admitting: Pulmonary Disease

## 2011-03-22 ENCOUNTER — Ambulatory Visit (INDEPENDENT_AMBULATORY_CARE_PROVIDER_SITE_OTHER): Payer: BC Managed Care – PPO | Admitting: Pulmonary Disease

## 2011-03-22 ENCOUNTER — Encounter: Payer: Self-pay | Admitting: Pulmonary Disease

## 2011-03-22 VITALS — BP 140/90 | HR 106 | Temp 98.5°F | Ht 63.0 in | Wt 197.6 lb

## 2011-03-22 DIAGNOSIS — J449 Chronic obstructive pulmonary disease, unspecified: Secondary | ICD-10-CM

## 2011-03-22 MED ORDER — BUDESONIDE-FORMOTEROL FUMARATE 160-4.5 MCG/ACT IN AERO: 2.0000 | INHALATION_SPRAY | Freq: Two times a day (BID) | RESPIRATORY_TRACT | Status: DC

## 2011-03-22 MED ORDER — ALBUTEROL SULFATE HFA 108 (90 BASE) MCG/ACT IN AERS: 2.0000 | INHALATION_SPRAY | Freq: Four times a day (QID) | RESPIRATORY_TRACT | Status: DC | PRN

## 2011-03-22 MED ORDER — TIOTROPIUM BROMIDE MONOHYDRATE 18 MCG IN CAPS: 18.0000 ug | ORAL_CAPSULE | Freq: Every day | RESPIRATORY_TRACT | Status: DC

## 2011-03-22 NOTE — Progress Notes (Signed)
  Subjective:    Patient ID: [name], female    DOB: Dec 19, 1965, 45 y.o.   MRN: [id number]  HPI The pt comes in today for f/u of her known emphysema.  She has continued to have breathing issues, and has required rounds of abx and pred since her last visit.  She is continuing to smoke, and I have told her she will not improve until she stops. She has not seen a big improvement since starting on symbicort.     Review of Systems  Constitutional: Negative for fever and unexpected weight change.  HENT: Positive for postnasal drip. Negative for ear pain, nosebleeds, congestion, sore throat, rhinorrhea, sneezing, trouble swallowing, dental problem and sinus pressure.   Eyes: Negative for redness and itching.  Respiratory: Positive for cough, chest tightness, shortness of breath and wheezing.   Cardiovascular: Negative for palpitations and leg swelling.  Gastrointestinal: Negative for nausea and vomiting.  Genitourinary: Negative for dysuria.  Musculoskeletal: Negative for joint swelling.  Skin: Negative for rash.  Neurological: Positive for headaches.  Hematological: Does not bruise/bleed easily.  Psychiatric/Behavioral: Negative for dysphoric mood. The patient is not nervous/anxious.        Objective:   Physical Exam Obese female in nad Nares without discharge or purulence Chest with decreased bs, no wheezing Cor with rrr LE without edema, no cyanosis Alert, oriented, moves all 4        Assessment & Plan:

## 2011-03-22 NOTE — Patient Instructions (Signed)
Stay on symbicort Will add spiriva one inhalation each am for the next one month.  If you do not see a difference, can discontinue You must stop smoking if you want to get better. Work on weight reduction followup with me in 6mos, but call if having issues.

## 2011-03-26 ENCOUNTER — Encounter: Payer: Self-pay | Admitting: Pulmonary Disease

## 2011-03-26 NOTE — Assessment & Plan Note (Addendum)
The pt has known severe airflow obstruction, and unfortunately continues to smoke.  Will add spiriva to her symbicort, but explained to her again she will not see significant improvement until she stops smoking.  There is nothing more to do for her medically at this point, and her fate is truly in her own hands.

## 2011-07-15 ENCOUNTER — Other Ambulatory Visit: Payer: Self-pay | Admitting: Pulmonary Disease

## 2011-09-23 ENCOUNTER — Ambulatory Visit (INDEPENDENT_AMBULATORY_CARE_PROVIDER_SITE_OTHER): Payer: BC Managed Care – PPO | Admitting: Pulmonary Disease

## 2011-09-23 ENCOUNTER — Encounter: Payer: Self-pay | Admitting: Pulmonary Disease

## 2011-09-23 VITALS — BP 124/82 | HR 112 | Temp 98.2°F | Ht 63.0 in | Wt 207.6 lb

## 2011-09-23 DIAGNOSIS — J4489 Other specified chronic obstructive pulmonary disease: Secondary | ICD-10-CM

## 2011-09-23 DIAGNOSIS — J449 Chronic obstructive pulmonary disease, unspecified: Secondary | ICD-10-CM

## 2011-09-23 NOTE — Assessment & Plan Note (Signed)
The patient continues to have significant dyspnea on exertion that is secondary to her COPD and obesity with deconditioning.  Unfortunately, she continues to smoke, and I have told her again she will never improve until she stops.  She is having a hard time affording her symbicort, and wants to know about going on nebulized bronchodilators.  I have told her that we can put her on albuterol and budesonide, and she would like to check on the cost of this.  She is asking for a jury excuse letter today, but I explained we typically reserve this for patients who have chronic respiratory failure with chronic oxygen use and resting dyspnea.  She is to let me know what she decides about her bronchodilator regimen, and will followup with me in 6 months.

## 2011-09-23 NOTE — Patient Instructions (Signed)
Stay on symbicort for now, but can consider changing to albuterol and budesonide in neb treatments four times a day You need to quit smoking.  This is the key to everything for you.  There is no medicine that will do the same thing as smoking cessation  followup with me in 6mos

## 2011-09-23 NOTE — Progress Notes (Signed)
  Subjective:    Patient ID: [name], female    DOB: February 18, 1966, 46 y.o.   MRN: [id number]  HPI The patient comes in today for followup of her known COPD.  She was tried on Spiriva at the last visit, but feels it really made no difference to her breathing.  She is currently on symbicort, but is having difficulty affording this, and would like to consider going on nebulized bronchodilators.  She recently had an acute episode, and was started on albuterol as needed at home.  She currently is back to her usual baseline, but has chronic dyspnea on exertion.  Unfortunately, she continues to smoke, and I have told her this is her primary issue at this time.   Review of Systems  Constitutional: Negative for fever and unexpected weight change.  HENT: Positive for congestion, sneezing and sinus pressure. Negative for ear pain, nosebleeds, sore throat, rhinorrhea, trouble swallowing, dental problem and postnasal drip.   Eyes: Negative for redness and itching.  Respiratory: Positive for cough and shortness of breath. Negative for chest tightness and wheezing.   Cardiovascular: Negative for palpitations and leg swelling.  Gastrointestinal: Negative for nausea and vomiting.  Genitourinary: Negative for dysuria.  Musculoskeletal: Negative for joint swelling.  Skin: Negative for rash.  Neurological: Negative for headaches.  Hematological: Does not bruise/bleed easily.  Psychiatric/Behavioral: Negative for dysphoric mood. The patient is not nervous/anxious.        Objective:   Physical Exam Obese female in no acute distress Nose without purulence or discharge noted Chest with mild decrease in breath sounds, no wheezing or rhonchi Heart exam with regular rate and rhythm Lower extremities without significant edema, no cyanosis noted Alert and oriented, moves all 4 extremities.       Assessment & Plan:

## 2011-12-04 ENCOUNTER — Other Ambulatory Visit: Payer: Self-pay | Admitting: Pulmonary Disease

## 2011-12-04 MED ORDER — BUDESONIDE-FORMOTEROL FUMARATE 160-4.5 MCG/ACT IN AERO: 2.0000 | INHALATION_SPRAY | Freq: Two times a day (BID) | RESPIRATORY_TRACT | Status: AC

## 2012-02-06 ENCOUNTER — Telehealth: Payer: Self-pay | Admitting: Pulmonary Disease

## 2012-02-06 NOTE — Telephone Encounter (Signed)
KC are you okay with this? Please advise thanks

## 2012-02-06 NOTE — Telephone Encounter (Signed)
Ok with me 

## 2012-02-06 NOTE — Telephone Encounter (Signed)
ATC NA and no option to leave a msg, WCB.  

## 2012-02-07 NOTE — Telephone Encounter (Signed)
LMTCB x 1 

## 2012-02-11 NOTE — Telephone Encounter (Signed)
I spoke with pt and she is requesting to be seen by Dr. Vassie Loll since he has the first available. Please advise Dr. Vassie Loll thanks

## 2012-02-20 ENCOUNTER — Other Ambulatory Visit: Payer: Self-pay | Admitting: Specialist

## 2012-02-20 DIAGNOSIS — M549 Dorsalgia, unspecified: Secondary | ICD-10-CM

## 2012-02-24 NOTE — Telephone Encounter (Signed)
Dr. Alva please advise, thanks!  

## 2012-02-25 NOTE — Telephone Encounter (Signed)
I d/w Dr Shelle Iron - prefer if someone else sees her.

## 2012-02-25 NOTE — Telephone Encounter (Signed)
Dr. Sherene Sires, do you want to see this pt? Please advise, thanks

## 2012-02-26 NOTE — Telephone Encounter (Signed)
lmomtcb  

## 2012-02-26 NOTE — Telephone Encounter (Signed)
tammy or I can see her in meantime prn acute flare

## 2012-02-26 NOTE — Telephone Encounter (Signed)
i will be happy to see her as a second opinion and go from there.  She will need her pft's and cxr to complete the w/u and I strongly prefer we do all 3 (ov/pft's/cxr) at same ov with all meds in hand.  If not agreeable to this we should probably refer her somewhere else

## 2012-02-27 NOTE — Telephone Encounter (Signed)
LMTCB x 1 

## 2012-02-28 ENCOUNTER — Ambulatory Visit
Admission: RE | Admit: 2012-02-28 | Discharge: 2012-02-28 | Disposition: A | Discharge status: Home / Self Care | Payer: BC Managed Care – PPO | Source: Ambulatory Visit | Attending: Specialist | Admitting: Specialist

## 2012-02-28 VITALS — BP 109/66 | HR 91 | Ht 63.0 in | Wt 205.0 lb

## 2012-02-28 DIAGNOSIS — M549 Dorsalgia, unspecified: Secondary | ICD-10-CM

## 2012-02-28 MED ORDER — ONDANSETRON HCL 4 MG/2ML IJ SOLN
4.0000 mg | Freq: Once | INTRAMUSCULAR | Status: AC
  Administered 2012-02-28: 4 mg via INTRAMUSCULAR

## 2012-02-28 MED ORDER — MEPERIDINE HCL 100 MG/ML IJ SOLN
75.0000 mg | Freq: Once | INTRAMUSCULAR | Status: AC
  Administered 2012-02-28: 75 mg via INTRAMUSCULAR

## 2012-02-28 MED ORDER — DIAZEPAM 5 MG PO TABS
10.0000 mg | ORAL_TABLET | Freq: Once | ORAL | Status: AC
  Administered 2012-02-28: 5 mg via ORAL

## 2012-02-28 MED ORDER — IOHEXOL 180 MG/ML  SOLN
15.0000 mL | Freq: Once | INTRAMUSCULAR | Status: AC | PRN
  Administered 2012-02-28: 15 mL via INTRATHECAL

## 2012-02-28 NOTE — Discharge Instructions (Signed)

## 2012-02-28 NOTE — Telephone Encounter (Signed)
Lmomtcb. Per triage protocol, will sign off on msg. 

## 2012-03-06 ENCOUNTER — Telehealth: Payer: Self-pay | Admitting: Internal Medicine

## 2012-03-06 NOTE — Telephone Encounter (Signed)
Pls see phone msg from 02/06/12 for additional information -- pt had called regarding switching drs from [facility].  Dr. Vassie Loll had preferred she see someone else.  Dr. Sherene Sires ok'd with the switch.  Advised pt Dr. Sherene Sires ok with her switching to him.  Pt states she "would rather see Dr. Shelle Iron than Dr. Sherene Sires."  She would like to stay with Dr. Shelle Iron for now.    Also, pt states she needs to have lumbar/spinal surgery and will need pulmonary clearance.  She is requesting an OV as she did have one pending in July but this was cancelled as [facility] will be out of the office.  We have scheduled her to see [facility] on March 09, 2012 at 4:15 pm at which time she will discuss surgery clearance with KC.  She is aware of pending OV and verbalized understanding.

## 2012-03-09 ENCOUNTER — Ambulatory Visit (INDEPENDENT_AMBULATORY_CARE_PROVIDER_SITE_OTHER): Payer: BC Managed Care – PPO | Admitting: Pulmonary Disease

## 2012-03-09 ENCOUNTER — Encounter: Payer: Self-pay | Admitting: Pulmonary Disease

## 2012-03-09 VITALS — BP 128/80 | HR 106 | Temp 98.1°F | Ht 63.0 in | Wt 218.6 lb

## 2012-03-09 DIAGNOSIS — J449 Chronic obstructive pulmonary disease, unspecified: Secondary | ICD-10-CM

## 2012-03-09 MED ORDER — ALBUTEROL SULFATE HFA 108 (90 BASE) MCG/ACT IN AERS: 2.0000 | INHALATION_SPRAY | Freq: Four times a day (QID) | RESPIRATORY_TRACT | Status: DC | PRN

## 2012-03-09 NOTE — Progress Notes (Signed)
  Subjective:    Patient ID: [name], female    DOB: [birth year], 46 y.o.   MRN: [id number]  HPI The patient comes in today for followup of her known COPD, and also for preop pulmonary evaluation prior to back surgery.  The patient has known moderate to severe airflow obstruction by PFTs in the past, and unfortunately continues to smoke.  She is continuing on symbicort which she thinks has helped, but did not see improvement with Spiriva in the past.  She is scheduled to have back surgery upcoming.  She currently feels that her breathing is at her usual baseline, and has not had an acute exacerbation recently.  She has minimal cough, and no purulent mucus.   Review of Systems  Constitutional: Negative for fever and unexpected weight change.  HENT: Positive for postnasal drip and sinus pressure. Negative for ear pain, nosebleeds, congestion, sore throat, rhinorrhea, sneezing, trouble swallowing and dental problem.   Eyes: Negative for redness and itching.  Respiratory: Positive for cough, shortness of breath and wheezing. Negative for chest tightness.   Cardiovascular: Positive for leg swelling. Negative for palpitations.  Gastrointestinal: Negative for nausea and vomiting.  Genitourinary: Negative for dysuria.  Musculoskeletal: Negative for joint swelling.  Skin: Negative for rash.  Neurological: Negative for headaches.  Hematological: Does not bruise/bleed easily.  Psychiatric/Behavioral: Negative for dysphoric mood. The patient is not nervous/anxious.   All other systems reviewed and are negative.       Objective:   Physical Exam Obese female in no acute distress Nose without purulence or discharge noted Chest with mild decrease in breath sounds, no wheezes or rhonchi.  No crackles noted Cardiac exam with regular rate and rhythm Lower extremities without edema, no cyanosis Alert and oriented, moves all 4 extremities.        Assessment & Plan:

## 2012-03-09 NOTE — Patient Instructions (Addendum)
Continue on symbicort as you are doing  Will try tudorza one inhalation each am and pm for next 4weeks.  Can continue this is you feel it has helped you.  Quitting or decreasing smoking 2 weeks before general anesthesia greatly reduces your pulmonary risks for surgery. Get out of bed as soon as it is allowed by your surgeon, and use incentive spirometer every hour while awake.  This reduces your risk for postoperative pneumonia.  followup with me after your back surgery to make sure you are doing ok.

## 2012-03-09 NOTE — Assessment & Plan Note (Signed)
The patient has moderate to severe obstructive lung disease, but currently is near her usual baseline.  With regard to her upcoming surgery, I think that she is at moderate risk for postop pulmonary complications.  I also think her obesity is a significant risk for her as well.  I have asked her to consider stopping smoking or at least reducing her quantity 2 weeks prior to general anesthesia.  She is to stay on symbicort, but we'll add tudorza to see if this gets further improvement.  I have also discussed with her the importance of early ambulation, and the use of incentive spirometry.  She is to followup with [place] after the surgery to check and see how she is doing.

## 2012-03-17 ENCOUNTER — Telehealth: Payer: Self-pay | Admitting: Pulmonary Disease

## 2012-03-17 NOTE — Telephone Encounter (Signed)
I spoke with Cordelia Pen Dr. Barbaraann Faster nurse and they did receive the clearance. I made pt aware of this and needed nothing further

## 2012-03-17 NOTE — Telephone Encounter (Signed)
This has already been done.  Please check with ortho office to see if they have received my note.  If not, then print my last ov, fax to them, and then call to verify they received.  Thanks.

## 2012-03-17 NOTE — Telephone Encounter (Signed)
Spoke with pt. She states that [facility] cleared her for back surgery, but she is needing letter stating this sent to Dr. Otelia Sergeant. Please advise thanks!

## 2012-03-23 ENCOUNTER — Encounter (HOSPITAL_COMMUNITY): Payer: Self-pay | Admitting: Pharmacy Technician

## 2012-03-23 ENCOUNTER — Ambulatory Visit: Payer: BC Managed Care – PPO | Admitting: Pulmonary Disease

## 2012-03-31 ENCOUNTER — Encounter (HOSPITAL_COMMUNITY)
Admission: RE | Admit: 2012-03-31 | Discharge: 2012-03-31 | Disposition: A | Discharge status: Home / Self Care | Payer: BC Managed Care – PPO | Source: Ambulatory Visit | Attending: Physician Assistant | Admitting: Physician Assistant

## 2012-03-31 ENCOUNTER — Encounter (HOSPITAL_COMMUNITY): Payer: Self-pay

## 2012-03-31 ENCOUNTER — Encounter (HOSPITAL_COMMUNITY)
Admission: RE | Admit: 2012-03-31 | Discharge: 2012-03-31 | Disposition: A | Discharge status: Home / Self Care | Payer: BC Managed Care – PPO | Source: Ambulatory Visit | Attending: Specialist | Admitting: Specialist

## 2012-03-31 HISTORY — DX: Essential (primary) hypertension: I10

## 2012-03-31 HISTORY — DX: Gastro-esophageal reflux disease without esophagitis: K21.9

## 2012-03-31 HISTORY — DX: Chronic obstructive pulmonary disease, unspecified: J44.9

## 2012-03-31 HISTORY — DX: Shortness of breath: R06.02

## 2012-03-31 HISTORY — DX: Unspecified asthma, uncomplicated: J45.909

## 2012-03-31 LAB — URINALYSIS, ROUTINE W REFLEX MICROSCOPIC
Bilirubin Urine: NEGATIVE
Glucose, UA: NEGATIVE mg/dL
Hgb urine dipstick: NEGATIVE
Ketones, ur: NEGATIVE mg/dL
Leukocytes, UA: NEGATIVE
Protein, ur: NEGATIVE mg/dL
pH: 7 (ref 5.0–8.0)

## 2012-03-31 LAB — BASIC METABOLIC PANEL
BUN: 8 mg/dL (ref 6–23)
CO2: 26 mEq/L (ref 19–32)
GFR calc non Af Amer: >90 mL/min (ref >90)
Glucose, Bld: 89 mg/dL (ref 70–99)
Potassium: 3.7 mEq/L (ref 3.5–5.1)

## 2012-03-31 LAB — CBC
Hemoglobin: 16.8 g/dL — ABNORMAL HIGH (ref 12.0–15.0)
MCH: 31.4 pg (ref 26.0–34.0)
MCHC: 35.1 g/dL (ref 30.0–36.0)
MCV: 89.5 fL (ref 78.0–100.0)
RBC: 5.35 MIL/uL — ABNORMAL HIGH (ref 3.87–5.11)

## 2012-03-31 NOTE — Pre-Procedure Instructions (Signed)
19 [name]  03/31/2012   Your procedure is scheduled on:  04-06-2012  Report to [facility] at 5:30 AM.  Call this number if you have problems the morning of surgery: [phone]   Remember:   Do not eat food or drinkAfter Midnight.      Take these medicines the morning of surgery with A SIP OF WATER: inhaler as needed and instructed by MD,flonase,pain medication as needed,omeprazole(prilosec)   Do not wear jewelry, make-up or nail polish.  Do not wear lotions, powders, or perfumes. You may wear deodorant.  Do not shave 48 hours prior to surgery. Men may shave face and neck.  Do not bring valuables to the hospital.  Contacts, dentures or bridgework may not be worn into surgery.  Leave suitcase in the car. After surgery it may be brought to your room.  For patients admitted to the hospital, checkout time is 11:00 AM the day of discharge.  Marland Kitchen   Special Instructions: Incentive Spirometry - Practice and bring it with you on the day of surgery. and CHG Shower Use Special Wash: 1/2 bottle night before surgery and 1/2 bottle morning of surgery.   Please read over the following fact sheets that you were given: Pain Booklet, MRSA Information and Surgical Site Infection Prevention

## 2012-04-03 DIAGNOSIS — M48061 Spinal stenosis, lumbar region without neurogenic claudication: Secondary | ICD-10-CM | POA: Diagnosis present

## 2012-04-03 NOTE — H&P (Signed)
Patient examined and lab reviewed with Vernon PA-C. Patient was seen and examined in the preop holding area. There has been no interval  Change in this patient's exam preop  history and physical exam  Lab tests and images have been examined and reviewed.  The Risks benefits and alternative treatments have been discussed  extensively,questions answered.  The patient has elected to undergo the discussed surgical treatment.  

## 2012-04-03 NOTE — H&P (Signed)
[name] is an 46 y.o. female.   Chief Complaint: back and left leg pain HPI: no relief with conservative treatment including ESI.  Using narcotic analgesics regularly.  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    Past Medical History  Diagnosis Date  . Allergic rhinitis   . Hypertension   . COPD (chronic obstructive pulmonary disease)   . Shortness of breath   . Asthma   . GERD (gastroesophageal reflux disease)     Past Surgical History  Procedure Date  . Neck surgery Sept 2005  . Total abdominal hysterectomy Oct 2003  . Tubal ligation 1995    Family History  Problem Relation Age of Onset  . Emphysema Father   . Emphysema Mother   . Rheum arthritis Father   . Lung cancer Father    Social History:  reports that she has been smoking Cigarettes.  She has a 30 pack-year smoking history. She does not have any smokeless tobacco history on file. She reports that she drinks alcohol. She reports that she does not use illicit drugs.  Allergies:  Allergies  Allergen Reactions  . Erythromycin Rash       . Moxifloxacin Itching    Avelox     No prescriptions prior to admission    No results found for this or any previous visit (from the past 48 hour(s)). No results found.  Review of Systems  Constitutional: Negative.   Eyes: Negative.   Cardiovascular: Negative.   Gastrointestinal: Negative.   Genitourinary: Negative.   Musculoskeletal: Positive for back pain.  Skin: Negative.   Neurological: Negative.   Endo/Heme/Allergies: Negative.   Psychiatric/Behavioral: Negative.     There were no vitals taken for this visit. Physical Exam  Constitutional: She is oriented to person, place, and time. She appears well-developed and well-nourished.  HENT:  Head: Normocephalic and atraumatic.  Eyes: EOM are normal. Pupils are equal, round, and reactive to light.  Neck: Normal range of motion. Neck supple.  Cardiovascular: Normal rate and regular rhythm.     Respiratory: Effort normal and breath sounds normal.  GI: Soft. Bowel sounds are normal.  Musculoskeletal:       + SLR left.  No focal weakness left leg.  Distal pulses and sensation intact.    Neurological: She is alert and oriented to person, place, and time.  Skin: Skin is warm and dry.  Psychiatric: She has a normal mood and affect.     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

## 2012-04-05 MED ORDER — CEFAZOLIN SODIUM-DEXTROSE 2-3 GM-% IV SOLR
2.0000 g | INTRAVENOUS | Status: AC
  Administered 2012-04-06: 2 g via INTRAVENOUS
  Filled 2012-04-05: qty 50

## 2012-04-06 ENCOUNTER — Encounter (HOSPITAL_COMMUNITY): Payer: Self-pay | Admitting: Certified Registered"

## 2012-04-06 ENCOUNTER — Ambulatory Visit (HOSPITAL_COMMUNITY): Payer: BC Managed Care – PPO

## 2012-04-06 ENCOUNTER — Encounter (HOSPITAL_COMMUNITY)
Admission: RE | Disposition: A | Discharge status: Home / Self Care | Payer: Self-pay | Source: Ambulatory Visit | Attending: Specialist

## 2012-04-06 ENCOUNTER — Inpatient Hospital Stay (HOSPITAL_COMMUNITY)
Admission: RE | Admit: 2012-04-06 | Discharge: 2012-04-08 | DRG: 757 | Disposition: A | Discharge status: Home / Self Care | Payer: BC Managed Care – PPO | Source: Ambulatory Visit | Attending: Specialist | Admitting: Specialist

## 2012-04-06 ENCOUNTER — Encounter (HOSPITAL_COMMUNITY): Payer: Self-pay | Admitting: Specialist

## 2012-04-06 ENCOUNTER — Ambulatory Visit (HOSPITAL_COMMUNITY): Payer: BC Managed Care – PPO | Admitting: Certified Registered"

## 2012-04-06 DIAGNOSIS — Z01818 Encounter for other preprocedural examination: Secondary | ICD-10-CM

## 2012-04-06 DIAGNOSIS — J449 Chronic obstructive pulmonary disease, unspecified: Secondary | ICD-10-CM | POA: Diagnosis present

## 2012-04-06 DIAGNOSIS — J4489 Other specified chronic obstructive pulmonary disease: Secondary | ICD-10-CM | POA: Diagnosis present

## 2012-04-06 DIAGNOSIS — I10 Essential (primary) hypertension: Secondary | ICD-10-CM | POA: Diagnosis present

## 2012-04-06 DIAGNOSIS — M48061 Spinal stenosis, lumbar region without neurogenic claudication: Principal | ICD-10-CM | POA: Diagnosis present

## 2012-04-06 DIAGNOSIS — Z01810 Encounter for preprocedural cardiovascular examination: Secondary | ICD-10-CM

## 2012-04-06 DIAGNOSIS — F17200 Nicotine dependence, unspecified, uncomplicated: Secondary | ICD-10-CM | POA: Diagnosis present

## 2012-04-06 DIAGNOSIS — Z01812 Encounter for preprocedural laboratory examination: Secondary | ICD-10-CM

## 2012-04-06 DIAGNOSIS — K219 Gastro-esophageal reflux disease without esophagitis: Secondary | ICD-10-CM | POA: Diagnosis present

## 2012-04-06 HISTORY — PX: LUMBAR LAMINECTOMY/DECOMPRESSION MICRODISCECTOMY: SHX5026

## 2012-04-06 SURGERY — LUMBAR LAMINECTOMY/DECOMPRESSION MICRODISCECTOMY
Anesthesia: General | Site: Back | Wound class: Clean

## 2012-04-06 MED ORDER — METHOCARBAMOL 100 MG/ML IJ SOLN
500.0000 mg | Freq: Four times a day (QID) | INTRAVENOUS | Status: DC | PRN
  Administered 2012-04-06: 500 mg via INTRAVENOUS
  Filled 2012-04-06: qty 5

## 2012-04-06 MED ORDER — OXYCODONE-ACETAMINOPHEN 5-325 MG PO TABS
1.0000 | ORAL_TABLET | ORAL | Status: DC | PRN
  Administered 2012-04-06 – 2012-04-07 (×3): 2 via ORAL
  Filled 2012-04-06 (×3): qty 2

## 2012-04-06 MED ORDER — SODIUM CHLORIDE 0.9 % IV SOLN: 250.0000 mL | INTRAVENOUS | Status: DC

## 2012-04-06 MED ORDER — SORBITOL 70 % SOLN: 30.0000 mL | Freq: Every day | Status: DC | PRN

## 2012-04-06 MED ORDER — CEFAZOLIN SODIUM 1-5 GM-% IV SOLN
1.0000 g | Freq: Three times a day (TID) | INTRAVENOUS | Status: AC
  Administered 2012-04-06 (×2): 1 g via INTRAVENOUS
  Filled 2012-04-06 (×2): qty 50

## 2012-04-06 MED ORDER — PANTOPRAZOLE SODIUM 40 MG PO TBEC
40.0000 mg | DELAYED_RELEASE_TABLET | Freq: Every day | ORAL | Status: DC
  Administered 2012-04-07: 40 mg via ORAL
  Filled 2012-04-06: qty 1

## 2012-04-06 MED ORDER — FLEET ENEMA 7-19 GM/118ML RE ENEM: 1.0000 | ENEMA | Freq: Once | RECTAL | Status: AC | PRN

## 2012-04-06 MED ORDER — HYDROMORPHONE HCL PF 1 MG/ML IJ SOLN
INTRAMUSCULAR | Status: AC
  Filled 2012-04-06: qty 1

## 2012-04-06 MED ORDER — ONDANSETRON HCL 4 MG/2ML IJ SOLN
4.0000 mg | INTRAMUSCULAR | Status: DC | PRN
  Administered 2012-04-07 (×3): 4 mg via INTRAVENOUS
  Filled 2012-04-06 (×3): qty 2

## 2012-04-06 MED ORDER — LIDOCAINE HCL (CARDIAC) 20 MG/ML IV SOLN
INTRAVENOUS | Status: DC | PRN
  Administered 2012-04-06: 80 mg via INTRAVENOUS

## 2012-04-06 MED ORDER — ALBUTEROL SULFATE (5 MG/ML) 0.5% IN NEBU
2.5000 mg | INHALATION_SOLUTION | RESPIRATORY_TRACT | Status: DC
  Administered 2012-04-06 – 2012-04-07 (×7): 2.5 mg via RESPIRATORY_TRACT
  Filled 2012-04-06 (×7): qty 0.5

## 2012-04-06 MED ORDER — THROMBIN 20000 UNITS EX SOLR
CUTANEOUS | Status: AC
  Filled 2012-04-06: qty 20000

## 2012-04-06 MED ORDER — BUPIVACAINE-EPINEPHRINE (PF) 0.5% -1:200000 IJ SOLN
INTRAMUSCULAR | Status: AC
Start: 2012-04-06 — End: 2012-04-06
  Filled 2012-04-06: qty 10

## 2012-04-06 MED ORDER — LOSARTAN POTASSIUM 50 MG PO TABS
50.0000 mg | ORAL_TABLET | Freq: Every day | ORAL | Status: DC
  Administered 2012-04-07: 50 mg via ORAL
  Filled 2012-04-06 (×3): qty 1

## 2012-04-06 MED ORDER — THROMBIN 20000 UNITS EX KIT
PACK | CUTANEOUS | Status: DC | PRN
  Administered 2012-04-06: 20000 [IU] via TOPICAL

## 2012-04-06 MED ORDER — ACETAMINOPHEN 10 MG/ML IV SOLN
INTRAVENOUS | Status: DC | PRN
  Administered 2012-04-06: 1000 mg via INTRAVENOUS

## 2012-04-06 MED ORDER — ROCURONIUM BROMIDE 100 MG/10ML IV SOLN
INTRAVENOUS | Status: DC | PRN
  Administered 2012-04-06: 50 mg via INTRAVENOUS
  Administered 2012-04-06: 20 mg via INTRAVENOUS
  Administered 2012-04-06: 10 mg via INTRAVENOUS

## 2012-04-06 MED ORDER — ALBUTEROL SULFATE HFA 108 (90 BASE) MCG/ACT IN AERS
2.0000 | INHALATION_SPRAY | Freq: Four times a day (QID) | RESPIRATORY_TRACT | Status: DC | PRN
  Filled 2012-04-06: qty 6.7

## 2012-04-06 MED ORDER — HYDROMORPHONE HCL PF 1 MG/ML IJ SOLN
0.5000 mg | INTRAMUSCULAR | Status: DC | PRN
Start: 2012-04-06 — End: 2012-04-08
  Administered 2012-04-06 – 2012-04-08 (×11): 1 mg via INTRAVENOUS
  Filled 2012-04-06 (×10): qty 1

## 2012-04-06 MED ORDER — DOCUSATE SODIUM 100 MG PO CAPS
100.0000 mg | ORAL_CAPSULE | Freq: Two times a day (BID) | ORAL | Status: DC
  Administered 2012-04-06 – 2012-04-07 (×3): 100 mg via ORAL
  Filled 2012-04-06 (×5): qty 1

## 2012-04-06 MED ORDER — PROPOFOL 10 MG/ML IV EMUL
INTRAVENOUS | Status: DC | PRN
  Administered 2012-04-06: 120 mg via INTRAVENOUS

## 2012-04-06 MED ORDER — ACETAMINOPHEN 10 MG/ML IV SOLN
INTRAVENOUS | Status: AC
  Filled 2012-04-06: qty 100

## 2012-04-06 MED ORDER — FENTANYL CITRATE 0.05 MG/ML IJ SOLN
INTRAMUSCULAR | Status: DC | PRN
  Administered 2012-04-06: 50 ug via INTRAVENOUS
  Administered 2012-04-06: 150 ug via INTRAVENOUS
  Administered 2012-04-06 (×2): 50 ug via INTRAVENOUS

## 2012-04-06 MED ORDER — MIDAZOLAM HCL 5 MG/5ML IJ SOLN
INTRAMUSCULAR | Status: DC | PRN
  Administered 2012-04-06: 2 mg via INTRAVENOUS

## 2012-04-06 MED ORDER — BUDESONIDE-FORMOTEROL FUMARATE 160-4.5 MCG/ACT IN AERO
2.0000 | INHALATION_SPRAY | Freq: Two times a day (BID) | RESPIRATORY_TRACT | Status: DC
  Administered 2012-04-06 – 2012-04-08 (×4): 2 via RESPIRATORY_TRACT
  Filled 2012-04-06 (×2): qty 6

## 2012-04-06 MED ORDER — ACETAMINOPHEN 650 MG RE SUPP: 650.0000 mg | RECTAL | Status: DC | PRN

## 2012-04-06 MED ORDER — THROMBIN 20000 UNITS EX KIT
PACK | CUTANEOUS | Status: DC | PRN
  Administered 2012-04-06: 09:00:00 via TOPICAL

## 2012-04-06 MED ORDER — ONDANSETRON HCL 4 MG/2ML IJ SOLN
INTRAMUSCULAR | Status: DC | PRN
  Administered 2012-04-06: 4 mg via INTRAVENOUS

## 2012-04-06 MED ORDER — BUPIVACAINE-EPINEPHRINE 0.5% -1:200000 IJ SOLN
INTRAMUSCULAR | Status: DC | PRN
  Administered 2012-04-06: 10 mL

## 2012-04-06 MED ORDER — METHOCARBAMOL 500 MG PO TABS
500.0000 mg | ORAL_TABLET | Freq: Four times a day (QID) | ORAL | Status: DC | PRN
  Administered 2012-04-06 – 2012-04-07 (×4): 500 mg via ORAL
  Filled 2012-04-06 (×5): qty 1

## 2012-04-06 MED ORDER — HYDROMORPHONE HCL PF 1 MG/ML IJ SOLN
INTRAMUSCULAR | Status: DC | PRN
  Administered 2012-04-06: 0.5 mg via INTRAVENOUS
  Administered 2012-04-06 (×2): .25 mg via INTRAVENOUS

## 2012-04-06 MED ORDER — POLYETHYLENE GLYCOL 3350 17 G PO PACK: 17.0000 g | PACK | Freq: Every day | ORAL | Status: DC | PRN

## 2012-04-06 MED ORDER — ALUM & MAG HYDROXIDE-SIMETH 200-200-20 MG/5ML PO SUSP
30.0000 mL | Freq: Four times a day (QID) | ORAL | Status: DC | PRN
  Administered 2012-04-07: 30 mL via ORAL
  Filled 2012-04-06: qty 30

## 2012-04-06 MED ORDER — ACLIDINIUM BROMIDE 400 MCG/ACT IN AEPB: 1.0000 | INHALATION_SPRAY | Freq: Two times a day (BID) | RESPIRATORY_TRACT | Status: DC

## 2012-04-06 MED ORDER — HYDROCODONE-ACETAMINOPHEN 5-325 MG PO TABS
1.0000 | ORAL_TABLET | ORAL | Status: DC | PRN
  Administered 2012-04-07 – 2012-04-08 (×4): 2 via ORAL
  Filled 2012-04-06 (×4): qty 2

## 2012-04-06 MED ORDER — HYDROMORPHONE HCL PF 1 MG/ML IJ SOLN
0.2500 mg | INTRAMUSCULAR | Status: DC | PRN
  Administered 2012-04-06: 0.5 mg via INTRAVENOUS

## 2012-04-06 MED ORDER — SODIUM CHLORIDE 0.9 % IJ SOLN: 3.0000 mL | INTRAMUSCULAR | Status: DC | PRN

## 2012-04-06 MED ORDER — LACTATED RINGERS IV SOLN
INTRAVENOUS | Status: DC | PRN
  Administered 2012-04-06 (×2): via INTRAVENOUS

## 2012-04-06 MED ORDER — PHENYLEPHRINE HCL 10 MG/ML IJ SOLN
INTRAMUSCULAR | Status: DC | PRN
  Administered 2012-04-06 (×3): 40 ug via INTRAVENOUS

## 2012-04-06 MED ORDER — PHENOL 1.4 % MT LIQD: 1.0000 | OROMUCOSAL | Status: DC | PRN

## 2012-04-06 MED ORDER — FLUTICASONE PROPIONATE 50 MCG/ACT NA SUSP
2.0000 | Freq: Every day | NASAL | Status: DC
  Administered 2012-04-07 – 2012-04-08 (×2): 2 via NASAL
  Filled 2012-04-06: qty 16

## 2012-04-06 MED ORDER — ONDANSETRON HCL 4 MG/2ML IJ SOLN: 4.0000 mg | Freq: Once | INTRAMUSCULAR | Status: DC | PRN

## 2012-04-06 MED ORDER — MENTHOL 3 MG MT LOZG: 1.0000 | LOZENGE | OROMUCOSAL | Status: DC | PRN

## 2012-04-06 MED ORDER — NICOTINE 14 MG/24HR TD PT24
14.0000 mg | MEDICATED_PATCH | Freq: Every day | TRANSDERMAL | Status: DC | PRN
  Administered 2012-04-06: 14 mg via TRANSDERMAL
  Filled 2012-04-06 (×2): qty 1

## 2012-04-06 MED ORDER — ACETAMINOPHEN 10 MG/ML IV SOLN: 1000.0000 mg | Freq: Once | INTRAVENOUS | Status: DC

## 2012-04-06 MED ORDER — ZOLPIDEM TARTRATE 5 MG PO TABS: 5.0000 mg | ORAL_TABLET | Freq: Every evening | ORAL | Status: DC | PRN

## 2012-04-06 MED ORDER — ACETAMINOPHEN 325 MG PO TABS: 650.0000 mg | ORAL_TABLET | ORAL | Status: DC | PRN

## 2012-04-06 MED ORDER — GLYCOPYRROLATE 0.2 MG/ML IJ SOLN
INTRAMUSCULAR | Status: DC | PRN
  Administered 2012-04-06: .8 mg via INTRAVENOUS

## 2012-04-06 MED ORDER — NEOSTIGMINE METHYLSULFATE 1 MG/ML IJ SOLN
INTRAMUSCULAR | Status: DC | PRN
  Administered 2012-04-06: 5 mg via INTRAVENOUS

## 2012-04-06 MED ORDER — CHLORHEXIDINE GLUCONATE 4 % EX LIQD: 60.0000 mL | Freq: Once | CUTANEOUS | Status: DC

## 2012-04-06 MED ORDER — SODIUM CHLORIDE 0.9 % IJ SOLN
3.0000 mL | Freq: Two times a day (BID) | INTRAMUSCULAR | Status: DC
  Administered 2012-04-07: 3 mL via INTRAVENOUS

## 2012-04-06 SURGICAL SUPPLY — 57 items
ADH SKN CLS APL DERMABOND .7 (GAUZE/BANDAGES/DRESSINGS) ×1
AIRSTRIP 3X4 (GAUZE/BANDAGES/DRESSINGS) ×2 IMPLANT
BUR ROUND FLUTED 4 SOFT TCH (BURR) IMPLANT
CLOTH BEACON ORANGE TIMEOUT ST (SAFETY) ×2 IMPLANT
CORDS BIPOLAR (ELECTRODE) ×2 IMPLANT
DERMABOND ADVANCED (GAUZE/BANDAGES/DRESSINGS) ×1
DERMABOND ADVANCED .7 DNX12 (GAUZE/BANDAGES/DRESSINGS) ×1 IMPLANT
DRAPE INCISE IOBAN 66X45 STRL (DRAPES) ×1 IMPLANT
DRAPE MICROSCOPE LEICA (MISCELLANEOUS) ×2 IMPLANT
DRAPE POUCH INSTRU U-SHP 10X18 (DRAPES) ×2 IMPLANT
DRAPE PROXIMA HALF (DRAPES) ×1 IMPLANT
DRAPE SURG 17X23 STRL (DRAPES) ×8 IMPLANT
DRSG MEPILEX BORDER 4X4 (GAUZE/BANDAGES/DRESSINGS) ×1 IMPLANT
DRSG MEPILEX BORDER 4X8 (GAUZE/BANDAGES/DRESSINGS) IMPLANT
DURAPREP 26ML APPLICATOR (WOUND CARE) ×2 IMPLANT
ELECT CAUTERY BLADE 6.4 (BLADE) ×2 IMPLANT
ELECT REM PT RETURN 9FT ADLT (ELECTROSURGICAL) ×2
ELECTRODE REM PT RTRN 9FT ADLT (ELECTROSURGICAL) ×1 IMPLANT
EVACUATOR 1/8 PVC DRAIN (DRAIN) IMPLANT
GLOVE BIOGEL PI IND STRL 7.5 (GLOVE) ×1 IMPLANT
GLOVE BIOGEL PI INDICATOR 7.5 (GLOVE) ×1
GLOVE ECLIPSE 7.0 STRL STRAW (GLOVE) ×2 IMPLANT
GLOVE ECLIPSE 8.5 STRL (GLOVE) ×2 IMPLANT
GLOVE SURG 8.5 LATEX PF (GLOVE) ×2 IMPLANT
GOWN PREVENTION PLUS LG XLONG (DISPOSABLE) ×1 IMPLANT
GOWN PREVENTION PLUS XXLARGE (GOWN DISPOSABLE) ×2 IMPLANT
GOWN STRL NON-REIN LRG LVL3 (GOWN DISPOSABLE) ×4 IMPLANT
KIT BASIN OR (CUSTOM PROCEDURE TRAY) ×2 IMPLANT
KIT ROOM TURNOVER OR (KITS) ×2 IMPLANT
MANIFOLD NEPTUNE II (INSTRUMENTS) ×2 IMPLANT
NDL SPNL 18GX3.5 QUINCKE PK (NEEDLE) ×2 IMPLANT
NEEDLE 22X1 1/2 (OR ONLY) (NEEDLE) ×2 IMPLANT
NEEDLE SPNL 18GX3.5 QUINCKE PK (NEEDLE) ×4 IMPLANT
NS IRRIG 1000ML POUR BTL (IV SOLUTION) ×2 IMPLANT
PACK LAMINECTOMY ORTHO (CUSTOM PROCEDURE TRAY) ×2 IMPLANT
PAD ARMBOARD 7.5X6 YLW CONV (MISCELLANEOUS) ×4 IMPLANT
PATTIES SURGICAL .5 X.5 (GAUZE/BANDAGES/DRESSINGS) ×1 IMPLANT
PATTIES SURGICAL .75X.75 (GAUZE/BANDAGES/DRESSINGS) IMPLANT
PATTIES SURGICAL 1X1 (DISPOSABLE) IMPLANT
SET MIS RETRACTOR (KITS) ×1 IMPLANT
SPECIMEN JAR SMALL (MISCELLANEOUS) ×2 IMPLANT
SPONGE LAP 4X18 X RAY DECT (DISPOSABLE) IMPLANT
SPONGE SURGIFOAM ABS GEL 100 (HEMOSTASIS) ×1 IMPLANT
SUT VIC AB 0 CT1 27 (SUTURE) ×4
SUT VIC AB 0 CT1 27XBRD ANBCTR (SUTURE) ×2 IMPLANT
SUT VIC AB 1 CT1 27 (SUTURE) ×4
SUT VIC AB 1 CT1 27XBRD ANBCTR (SUTURE) ×2 IMPLANT
SUT VIC AB 2-0 CT1 27 (SUTURE) ×2
SUT VIC AB 2-0 CT1 TAPERPNT 27 (SUTURE) IMPLANT
SUT VICRYL 0 UR6 27IN ABS (SUTURE) IMPLANT
SUT VICRYL 4-0 PS2 18IN ABS (SUTURE) IMPLANT
SYR CONTROL 10ML LL (SYRINGE) ×2 IMPLANT
TOWEL OR 17X24 6PK STRL BLUE (TOWEL DISPOSABLE) ×2 IMPLANT
TOWEL OR 17X26 10 PK STRL BLUE (TOWEL DISPOSABLE) ×2 IMPLANT
TRAY FOLEY CATH 14FR (SET/KITS/TRAYS/PACK) ×1 IMPLANT
WATER STERILE IRR 1000ML POUR (IV SOLUTION) ×2 IMPLANT
YANKAUER SUCT BULB TIP NO VENT (SUCTIONS) ×2 IMPLANT

## 2012-04-06 NOTE — Anesthesia Preprocedure Evaluation (Addendum)
Anesthesia Evaluation  Patient identified by MRN, date of birth, ID band Patient awake    Reviewed: Allergy & Precautions, H&P , NPO status , Patient's Chart, lab work & pertinent test results  Airway Mallampati: II TM Distance: <3 FB Neck ROM: Full    Dental  (+) Teeth Intact   Pulmonary shortness of breath and with exertion, asthma , COPD COPD inhaler,  breath sounds clear to auscultation        Cardiovascular hypertension, Pt. on medications Rhythm:Regular Rate:Normal     Neuro/Psych    GI/Hepatic GERD-  Medicated and Controlled,  Endo/Other    Renal/GU      Musculoskeletal   Abdominal (+)  Abdomen: soft. Bowel sounds: normal.  Peds  Hematology   Anesthesia Other Findings   Reproductive/Obstetrics                         Anesthesia Physical Anesthesia Plan  ASA: III  Anesthesia Plan: General   Post-op Pain Management:    Induction: Intravenous  Airway Management Planned: Oral ETT  Additional Equipment:   Intra-op Plan:   Post-operative Plan: Extubation in OR  Informed Consent: I have reviewed the patients History and Physical, chart, labs and discussed the procedure including the risks, benefits and alternatives for the proposed anesthesia with the patient or authorized representative who has indicated his/her understanding and acceptance.     Plan Discussed with: CRNA and Surgeon  Anesthesia Plan Comments:         Anesthesia Quick Evaluation

## 2012-04-06 NOTE — Anesthesia Procedure Notes (Signed)
Procedure Name: Intubation Date/Time: 04/06/2012 7:14 AM Performed by: Ellin Goodie Pre-anesthesia Checklist: Patient identified, Emergency Drugs available, Suction available, Patient being monitored and Timeout performed Patient Re-evaluated:Patient Re-evaluated prior to inductionOxygen Delivery Method: Circle system utilized Preoxygenation: Pre-oxygenation with 100% oxygen Intubation Type: IV induction Ventilation: Mask ventilation without difficulty Laryngoscope Size: Mac and 4 Grade View: Grade I Tube type: Oral Tube size: 7.5 mm Number of attempts: 1 Airway Equipment and Method: Stylet Placement Confirmation: ETT inserted through vocal cords under direct vision,  positive ETCO2 and breath sounds checked- equal and bilateral Secured at: 23 cm Tube secured with: Tape Dental Injury: Teeth and Oropharynx as per pre-operative assessment

## 2012-04-06 NOTE — Brief Op Note (Addendum)
04/06/2012  9:40 AM  PATIENT:  [name]  46 y.o. 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  SURGEON:  Surgeon(s) and Role: Kerrin Champagne, MD - Primary  PHYSICIAN ASSISTANT: Maud Deed PA-C  ANESTHESIA:   local and general, Dr. Arta Bruce EBL:  Total I/O In: 1000 [I.V.:1000] Out: 200 [Urine:200]  BLOOD ADMINISTERED:none  DRAINS: none   LOCAL MEDICATIONS USED:  MARCAINE    and Amount: 10 ml  SPECIMEN:  No Specimen  DISPOSITION OF SPECIMEN:  N/A  COUNTS:  YES  TOURNIQUET:  * No tourniquets in log *  DICTATION: .Dragon Dictation  PLAN OF CARE: Admit to inpatient   PATIENT DISPOSITION:  PACU - hemodynamically stable.   Delay start of Pharmacological VTE agent (>24hrs) due to surgical blood loss or risk of bleeding: yes

## 2012-04-06 NOTE — Anesthesia Postprocedure Evaluation (Signed)
Anesthesia Post Note  Patient: [name]  Procedure(s) Performed: Procedure(s) (LRB): LUMBAR LAMINECTOMY/DECOMPRESSION MICRODISCECTOMY (N/A)  Anesthesia type: general  Patient location: PACU  Post pain: Pain level controlled  Post assessment: Patient's Cardiovascular Status Stable  Last Vitals:  Filed Vitals:   04/06/12 1045  BP: 106/73  Pulse: 89  Temp: 36.2 C  Resp: 18    Post vital signs: Reviewed and stable  Level of consciousness: sedated  Complications: No apparent anesthesia complications

## 2012-04-06 NOTE — Op Note (Addendum)
04/06/2012  9:47 AM  PATIENT:  [name]  46 y.o. 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     SURGEON:  Kerrin Champagne, MD     ASSISTANT:  Maud Deed, PA-C  (Present throughout the entire procedure and necessary for completion of procedure in a timely manner)     ANESTHESIA:  General,Dr. Arta Bruce. Supplemented with local anesthetic Marcaine 1/2% with 1-200,000 epinephrine 10 cc.  COMPLICATIONS:  None.      PROCEDURE:The patient was met in the holding area, and the appropriate left Lumbar level L3-4 and L4-5 identified and marked with "x" and my initials.The patient was then transported to OR OPERATING room #5 [facility] and was placed under general anesthesia without difficulty. The patient received appropriate preoperative antibiotic prophylaxis. Foley catheter was placed sterilely. The patient after intubation atraumatically was transferred to the operating room table, prone position, Wilson frame, sliding OR table. All pressure points were well padded. The arms in 90-90 well-padded at the elbows. Standard prep with DuraPrep solution lower dorsal spine to the mid sacral segment. Draped in the usual manner iodine Vi-Drape was used. Time-out procedure was called and correct. 2x 18-gauge spinal needle was then inserted at the expected L4 level. C-arm was draped sterilely to the field and used to identify the spinal needles positions. The needle was at the lower aspect of the lamina of L4 Skin superior to this was then infiltrated with Marcaine half percent with 1-200,000 epinephrine total of 10 cc used. An incision approximately an inch inch and a half in  length was then made through skin and subcutaneous layers in line with the left side of the expected midline just superior to the spinal needle entry point. An incision made into the left lumbosacral fascia approximately an inch in length .  Smallest dilator was then introduced into the incision site and used to carefully perform subperiosteal stripping of paralumbar muscles off of the posterior lamina of the expected L4-5 level. Successive dilators were then carried up to the 11 mm size. The depth measured off of the dilators at about 80 mm and 80 mm retractors and placed on the scaffolding for the MIS equipment and guided over dilators down to and docking on the posterior aspect of the lamina at the expected L4-5 level. This was sterilely attached to the articulating arm and upright bar which had been attached the OR table sterilely. C-arm fluoroscopy was identified the dilators and the retractors at the appropriate level L4-5. The operating room microscope sterilely draped brought into the field. Under the operating room microscope, the L4-5 interspace carefully debrided the small amount of muscle attachment here and high-speed bur used to drill the medial aspect of the inferior articular process of L4 approximately 10%. A localization lateral C-arm view was obtained with Penfield 4 in the L4-5 facet. 2 mm Kerrison then used to enter the spinal canal over the superior aspect of the L5 lamina carefully using the Kerrison to debris the attachment as a curet. Foraminotomy was then performed over the L5 nerve root. The medial 10% superior articular process of L5 and then resected using an osteotome and 2 mm Kerrison. This allowed for identification of the thecal sac. Penfield 4  was then used to carefully mobilize the thecal sac medially and the L5 nerve root identified within the lateral recess flattened over the medial aspect of the hypertrophic left L4-5 ligamentum flavum and left L4-5 facet. Carefully the lateral  aspect of the L5 nerve root was identified and a Penfield 4 was used to mobilize the nerve medially such that the herniated disc was visible with microscope.  Foraminotomies was performed over the L4 nerve root the nerve root was noted to be decompressed. The nerve root able to be retracted along the medial aspect of the L5 pedicle  at this level and bone overlying the left L5 foramen was further resected with Kerrisons..  Ligamentum flavum was further debrided superiorly to the level L4-5 disc. Had a moderate amount of further resection of the L5 lamina inferiorly was performed. With this then the disc space at L4-5 was easily visualized. Ball tip nerve probe was then able to carefully palpate the neuroforamen for L4 and L5 finding these to be well decompressed. Attention then turned to the left L3-4 level which was easily visualized with the microscope. Soft tissues debrided about the posterior aspect of the L3-4 interspace. High-speed bur and then used to carefully drill inferior 3 or 4 mm of the left side L3 inferior  lamina and on the medial aspect of the left L3 inferior articular process of 3 mm. The superior margin of the L4 lamina then carefully debrided with curette and a 2 mm kerrison then used to enter the spinal canal over the superior aspect of the L4 lamina resecting bone over the superior aspect and freeing up the attachment of ligamentum flavum here. Ligamentum flavum then debrided with the 2 mm and 3 mm Kerrisons we decompressed the L4 nerve root and the lateral recess left L3-4 decompressed using 2 and 3 mm Kerrisons sizing hypertrophic reflected ligamentum flavum extending superiorly. From was resected off the ventral aspect of the inferior margin of the L3 lamina. Hockey-stick nerve probe could then be passed out the L3 neuroforamen and the L4 neuroforamen. Bone bleeding encountered. Bone wax used to control this following this then the sac and the L5 nerve root were mobilized medially and the  L3-4 disc examined and found not to be herniated. Irrigation was carried out any bleeding controlled .  Irrigation carried careful examination demonstrated no active bleeding present. Retractors were then carefully removed  Small amount of bleeding within the soft tissue mass the laminotomy area was controlled using bipolar electrocautery. Irrigation was carried out using copious amounts of irrigant solution. All instrument were then removed. No significant active bleeding present at the time of removal. All instruments sponge counts were correct traction system was then carefully removed carefully rotating retractors with this withdrawal and only bipolar electrocautery of any small bleeders. Lumbodorsal fascia was then carefully approximated with interrupted 0 Vicryl sutures, UR 6 needle deep subcutaneous layers were approximated with interrupted 0 Vicryl sutures on UR 6 the appear subcutaneous layers approximated with interrupted 2-0 Vicryl sutures and the skin closed with a running subcutaneous stitch of 4-0 Vicryl. Dermabond was applied allowed to dry and then Mepilex bandage applied. Patient was then carefully returned to supine position on a stretcher, reactivated and extubated. He was then returned to recovery room in satisfactory condition.  Maud Deed PA-C perform the duties of assistant surgeon during this case. She was present from the beginning of the case to the end of the case assisting in transfer the patient from his stretcher to the  OR table and back to the stretcher at the end of the case. Assisted in careful retraction and suction of the laminectomy site delicate neural structures operating under the operating room microscope. She performed closure of the incision from the fascia to the skin applying the dressing.     Keondria Siever E  04/06/2012, 9:47 AM

## 2012-04-06 NOTE — Transfer of Care (Signed)
Immediate Anesthesia Transfer of Care Note  Patient: [name]  Procedure(s) Performed: Procedure(s) (LRB): LUMBAR LAMINECTOMY/DECOMPRESSION MICRODISCECTOMY (N/A)  Patient Location: PACU  Anesthesia Type: General  Level of Consciousness: awake and alert   Airway & Oxygen Therapy: Patient Spontanous Breathing  Post-op Assessment: Report given to PACU RN  Post vital signs: stable  Complications: No apparent anesthesia complications

## 2012-04-06 NOTE — H&P (Signed)
Patient was seen and examined in the preop holding area. There has been no interval  Change in this patient's exam preop  history and physical exam  Lab tests and images have been examined and reviewed.  The Risks benefits and alternative treatments have been discussed  extensively,questions answered.  The patient has elected to undergo the discussed surgical treatment. 

## 2012-04-06 NOTE — Preoperative (Signed)
Beta Blockers   Reason not to administer Beta Blockers:Not Applicable 

## 2012-04-07 ENCOUNTER — Encounter (HOSPITAL_COMMUNITY): Payer: Self-pay | Admitting: Specialist

## 2012-04-07 MED ORDER — GABAPENTIN 300 MG PO CAPS
300.0000 mg | ORAL_CAPSULE | Freq: Every day | ORAL | Status: DC
  Administered 2012-04-07: 300 mg via ORAL
  Filled 2012-04-07 (×2): qty 1

## 2012-04-07 MED ORDER — HYDROMORPHONE HCL 2 MG PO TABS
4.0000 mg | ORAL_TABLET | ORAL | Status: DC | PRN
  Administered 2012-04-07 – 2012-04-08 (×4): 4 mg via ORAL
  Filled 2012-04-07 (×4): qty 2

## 2012-04-07 MED ORDER — METHYLPREDNISOLONE 4 MG PO KIT
4.0000 mg | PACK | Freq: Three times a day (TID) | ORAL | Status: DC
  Administered 2012-04-08: 4 mg via ORAL

## 2012-04-07 MED ORDER — ALBUTEROL SULFATE (5 MG/ML) 0.5% IN NEBU
2.5000 mg | INHALATION_SOLUTION | Freq: Four times a day (QID) | RESPIRATORY_TRACT | Status: DC
  Administered 2012-04-08: 2.5 mg via RESPIRATORY_TRACT
  Filled 2012-04-07: qty 0.5

## 2012-04-07 MED ORDER — METHYLPREDNISOLONE 4 MG PO KIT: 4.0000 mg | PACK | Freq: Four times a day (QID) | ORAL | Status: DC

## 2012-04-07 MED ORDER — METHYLPREDNISOLONE 4 MG PO KIT
8.0000 mg | PACK | Freq: Every evening | ORAL | Status: AC
  Administered 2012-04-07: 8 mg via ORAL

## 2012-04-07 MED ORDER — METHYLPREDNISOLONE 4 MG PO KIT: 8.0000 mg | PACK | Freq: Every evening | ORAL | Status: DC

## 2012-04-07 MED ORDER — METHYLPREDNISOLONE 4 MG PO KIT
8.0000 mg | PACK | Freq: Every morning | ORAL | Status: AC
  Administered 2012-04-07: 8 mg via ORAL
  Filled 2012-04-07 (×2): qty 21

## 2012-04-07 MED ORDER — METHYLPREDNISOLONE 4 MG PO KIT
4.0000 mg | PACK | ORAL | Status: AC
  Administered 2012-04-07: 4 mg via ORAL

## 2012-04-07 MED ORDER — SODIUM CHLORIDE 0.9 % IV SOLN
INTRAVENOUS | Status: DC
  Administered 2012-04-07: 14:00:00 via INTRAVENOUS

## 2012-04-07 NOTE — Progress Notes (Signed)
Subjective: 1 Day Post-Op Procedure(s) (LRB): LUMBAR LAMINECTOMY/DECOMPRESSION MICRODISCECTOMY (N/A)Awake alert O x 4 c/o nausea and HAs. Wants to get out of bed.  Patient reports pain as mild.    Objective:   VITALS:  Temp:  [96.6 F (35.9 C)-99.1 F (37.3 C)] 99.1 F (37.3 C) (07/30 0531) Pulse Rate:  [86-102] 99  (07/30 0531) Resp:  [15-19] 19  (07/30 0531) BP: (92-125)/(50-97) 92/50 mmHg (07/30 0531) SpO2:  [91 %-100 %] 91 % (07/30 0531)  Neurologically intact ABD soft Sensation intact distally Intact pulses distally Dorsiflexion/Plantar flexion intact Incision: dressing C/D/I   LABS No results found for this basename: HGB:5,WBC:2,PLT:2 in the last 72 hours No results found for this basename: NA:2,K:2,CL:2,CO2:2,BUN:2,CREATININE:2,GLUCOSE:2, in the last 72 hours No results found for this basename: LABPT:2,INR:2 in the last 72 hours   Assessment/Plan: 1 Day Post-Op Procedure(s) (LRB): LUMBAR LAMINECTOMY/DECOMPRESSION MICRODISCECTOMY (N/A)  Advance diet Up with therapy Recheck later this AM after PT/OT and OOB to chair adjust narcotics, IS.  Lamarr Feenstra E 04/07/2012, 7:49 AM

## 2012-04-07 NOTE — Evaluation (Addendum)
Physical Therapy Evaluation Patient Details Name: [name] MRN: [id number] DOB: 05-May-1966 Today's Date: 04/07/2012 Time: 6578-4696 PT Time Calculation (min): 33 min  PT Assessment / Plan / Recommendation Clinical Impression  Pt is 46 y/o female admitted for s/p lumbar fusion.  Pt limited due to mild nausea this morning.  Pt will need to perform stair negotiation prior to d/c home.  Pt may need RW depending on pt progression.  Pt will benefit from acute PT services to improve overall mobility and prepare for safe d/c home with family.    PT Assessment  Patient needs continued PT services    Follow Up Recommendations  No PT follow up    Barriers to Discharge        Equipment Recommendations  None recommended by PT    Recommendations for Other Services     Frequency Min 5X/week    Precautions / Restrictions Precautions Precautions: Back Precaution Booklet Issued: Yes (comment) Precaution Comments: Reviewed 3/3 back precautions and handout given.   Pertinent Vitals/Pain 5/10 back pain      Mobility  Bed Mobility Bed Mobility: Rolling Right;Right Sidelying to Sit Rolling Right: 4: Min guard;With rail Right Sidelying to Sit: 4: Min guard;With rails Details for Bed Mobility Assistance: Minguard for safety with cues for proper technique and hand placement Transfers Transfers: Sit to Stand;Stand to Sit Sit to Stand: 4: Min guard;From bed Stand to Sit: 4: Min guard;To chair/3-in-1 Details for Transfer Assistance: Minguard for safety with cues for hand placement Ambulation/Gait Ambulation/Gait Assistance: 4: Min guard Ambulation Distance (Feet): 100 Feet Assistive device: Rolling walker Ambulation/Gait Assistance Details: Minguard for safety with cues for RW placement and upright posture Gait Pattern: Shuffle;Decreased trunk rotation Gait velocity: decrease gait speed due to pain    Exercises     PT Diagnosis: Difficulty walking;Abnormality of gait;Acute pain  PT  Problem List: Decreased activity tolerance;Decreased mobility;Decreased knowledge of use of DME;Decreased knowledge of precautions;Pain PT Treatment Interventions: DME instruction;Gait training;Stair training;Functional mobility training;Therapeutic activities;Therapeutic exercise;Patient/family education   PT Goals Acute Rehab PT Goals PT Goal Formulation: With patient Time For Goal Achievement: 04/14/12 Potential to Achieve Goals: Good Pt will Roll Supine to Right Side: with modified independence PT Goal: Rolling Supine to Right Side - Progress: Goal set today Pt will go Supine/Side to Sit: with modified independence PT Goal: Supine/Side to Sit - Progress: Goal set today Pt will go Sit to Supine/Side: with modified independence PT Goal: Sit to Supine/Side - Progress: Goal set today Pt will go Sit to Stand: with modified independence PT Goal: Sit to Stand - Progress: Goal set today Pt will go Stand to Sit: with modified independence PT Goal: Stand to Sit - Progress: Goal set today Pt will Ambulate: >150 feet;with modified independence;with least restrictive assistive device PT Goal: Ambulate - Progress: Goal set today Pt will Go Up / Down Stairs: with least restrictive assistive device;with supervision;6-9 stairs PT Goal: Up/Down Stairs - Progress: Goal set today Additional Goals Additional Goal #1: Pt able to recall and adhere to 3/3 back precautions. PT Goal: Additional Goal #1 - Progress: Goal set today  Visit Information  Last PT Received On: 04/07/12 Assistance Needed: +1    Subjective Data  Subjective: "I'm ready to get up."   Prior Functioning  Home Living Lives With: Spouse;Family Available Help at Discharge: Family Type of Home: House Home Access: Stairs to enter [employer] of Steps: 1 Entrance Stairs-Rails: Can reach both Home Layout: Two level Alternate Level Stairs-Number of Steps:  10 Alternate Level Stairs-Rails: Left Bathroom Shower/Tub: Walk-in  shower;Door [employer] Toilet: Standard Bathroom Accessibility: Yes How Accessible: Accessible via walker Home Adaptive Equipment: Grab bars in shower;Hand-held shower hose;Walker - rolling (seat in shower) Prior Function Level of Independence: Independent Able to Take Stairs?: Yes Driving: Yes Vocation: Unemployed Communication Communication: No difficulties Dominant Hand: Right    Cognition  Overall Cognitive Status: Appears within functional limits for tasks assessed/performed Arousal/Alertness: Awake/alert Orientation Level: Appears intact for tasks assessed Behavior During Session: [facility] for tasks performed    Extremity/Trunk Assessment Right Upper Extremity Assessment RUE ROM/Strength/Tone: Within functional levels Left Upper Extremity Assessment LUE ROM/Strength/Tone: Within functional levels Right Lower Extremity Assessment RLE ROM/Strength/Tone: Within functional levels RLE Sensation: WFL - Light Touch Left Lower Extremity Assessment LLE ROM/Strength/Tone: Within functional levels LLE Sensation: WFL - Light Touch   Balance    End of Session PT - End of Session Equipment Utilized During Treatment: Gait belt Activity Tolerance: Patient tolerated treatment well Patient left: in chair;with call bell/phone within reach;with family/visitor present Nurse Communication: Mobility status  GP   Mobility: Walking and Moving Around Current Status ([id number]): At least 1 percent but less than 20 percent impaired, limited or restricted Mobility: Walking and Moving Around Goal Status [phone]): 0 percent impaired, limited or restricted    Avery Eustice 04/07/2012, 8:41 AM Jake Shark, PT DPT [phone]

## 2012-04-07 NOTE — Progress Notes (Signed)
Occupational Therapy Evaluation Patient Details Name: [name] MRN: [id number] DOB: [birth year] Today's Date: 04/07/2012 Time: 5621-3086 OT Time Calculation (min): 25 min  OT Assessment / Plan / Recommendation Clinical Impression  Pt is 46 y/o female admitted for s/p lumbar fusion.  Pt limited due to mild nausea this morning. Pt. will benefit from OT services to maximize independence and safety in ADLs prior to d/c. OT to follow acutely.    OT Assessment  Patient needs continued OT Services    Follow Up Recommendations  No OT follow up    Barriers to Discharge      Equipment Recommendations  Other (comment) (TBD)    Recommendations for Other Services    Frequency  Min 2X/week    Precautions / Restrictions Precautions Precautions: Back Precaution Booklet Issued: Yes (comment) Precaution Comments: Reviewed 3/3 back precautions   Pertinent Vitals/Pain Pain 5/10 in back. Pt. Reported nausea and headache during session. Nursing notified for nausea meds.     ADL  Grooming: Performed;Wash/dry face;Set up Where Assessed - Grooming: Unsupported sitting Upper Body Bathing: Simulated;Set up Where Assessed - Upper Body Bathing: Unsupported sitting Upper Body Dressing: Simulated;Set up Where Assessed - Upper Body Dressing: Unsupported sitting Lower Body Dressing: Performed;+1 Total assistance Where Assessed - Lower Body Dressing: Unsupported sitting Toilet Transfer: Simulated;Min guard Toilet Transfer Method: Sit to stand Toilet Transfer Equipment: Regular height toilet Equipment Used: Gait belt;Rolling walker Transfers/Ambulation Related to ADLs: Min G assist for ambulation/transfers ADL Comments: Pt. reported she was nauseas and headache when sitting EOB. Pt. unable to don socks and required Total +1 assist. Pt. able to stand from bed with Min G assist. She reported pain in Rt. leg. Pt. able to wash face with Setup A sitting EOB. OT educated pt. on 3/3 back precautions and pt.  was able to verbalize them at end of session. OT demonstrated and explained AE.    OT Diagnosis: Acute pain  OT Problem List: Decreased strength;Decreased activity tolerance;Impaired balance (sitting and/or standing);Decreased knowledge of use of DME or AE;Decreased knowledge of precautions;Pain;Decreased range of motion OT Treatment Interventions: Self-care/ADL training;DME and/or AE instruction;Therapeutic activities;Patient/family education;Balance training   OT Goals Acute Rehab OT Goals OT Goal Formulation: With patient Time For Goal Achievement: 04/21/12 Potential to Achieve Goals: Good ADL Goals Pt Will Perform Grooming: with modified independence;Standing at sink ADL Goal: Grooming - Progress: Goal set today Pt Will Perform Lower Body Bathing: with modified independence;with adaptive equipment;Sit to stand from chair ADL Goal: Lower Body Bathing - Progress: Goal set today Pt Will Perform Lower Body Dressing: with modified independence;with adaptive equipment;Sit to stand from chair ADL Goal: Lower Body Dressing - Progress: Goal set today Pt Will Transfer to Toilet: with modified independence;Ambulation;Regular height toilet ADL Goal: Toilet Transfer - Progress: Goal set today Pt Will Perform Toileting - Clothing Manipulation: with modified independence;Standing ADL Goal: Toileting - Clothing Manipulation - Progress: Goal set today Pt Will Perform Toileting - Hygiene: with modified independence;Sit to stand from 3-in-1/toilet ADL Goal: Toileting - Hygiene - Progress: Goal set today Miscellaneous OT Goals Miscellaneous OT Goal #1: Pt. will verbalize and generalize 3/3 back precautions.  OT Goal: Miscellaneous Goal #1 - Progress: Goal set today  Visit Information  Last OT Received On: 04/07/12 Assistance Needed: +1    Subjective Data  Subjective: "I feel sick."   Prior Functioning  Vision/Perception  Home Living Lives With: Spouse;Family Available Help at Discharge:  Family Type of Home: House Home Access: Stairs to enter [employer] of  Steps: 1 Entrance Stairs-Rails: Can reach both Home Layout: Two level Alternate Level Stairs-Number of Steps: 10 Alternate Level Stairs-Rails: Left Bathroom Shower/Tub: Walk-in shower;Door [employer] Toilet: Standard Bathroom Accessibility: Yes How Accessible: Accessible via walker Home Adaptive Equipment: Grab bars in shower;Hand-held shower hose;Walker - rolling Prior Function Level of Independence: Independent Able to Take Stairs?: Yes Driving: Yes Vocation: Unemployed Communication Communication: No difficulties Dominant Hand: Right      Cognition  Overall Cognitive Status: Appears within functional limits for tasks assessed/performed Arousal/Alertness: Awake/alert Orientation Level: Appears intact for tasks assessed Behavior During Session: [facility] for tasks performed    Extremity/Trunk Assessment Right Upper Extremity Assessment RUE ROM/Strength/Tone: Within functional levels RUE Sensation: WFL - Light Touch Left Upper Extremity Assessment LUE ROM/Strength/Tone: Within functional levels LUE Sensation: WFL - Light Touch Right Lower Extremity Assessment RLE ROM/Strength/Tone: Within functional levels RLE Sensation: WFL - Light Touch Left Lower Extremity Assessment LLE ROM/Strength/Tone: Within functional levels LLE Sensation: WFL - Light Touch   Mobility Bed Mobility Bed Mobility: Sitting - Scoot to Edge of Bed;Sit to Sidelying Left;Left Sidelying to Sit Left Sidelying to Sit: 4: Min guard;With rails Sitting - Scoot to Edge of Bed: 4: Min guard Sit to Sidelying Left: 4: Min guard Details for Bed Mobility Assistance: Min G and vc's for proper technique Transfers Transfers: Sit to Stand;Stand to Sit Sit to Stand: 4: Min guard;From bed Stand to Sit: 4: Min guard;To bed          End of Session OT - End of Session Activity Tolerance: Patient tolerated treatment well Patient left: in  bed;with call bell/phone within reach;with family/visitor present Nurse Communication: Other (comment) (nausea meds)  GO     Jenell Milliner 04/07/2012, 10:24 AM

## 2012-04-07 NOTE — Progress Notes (Signed)
Read and agree with above.  Chino Sardo, OTR/L Pager: [phone] 04/07/2012   

## 2012-04-07 NOTE — Progress Notes (Signed)
CARE MANAGEMENT NOTE 04/07/2012  Patient:  Nichole, Morrison   Account Number:  [id number]  Date Initiated:  04/07/2012  Documentation initiated by:  Vance Peper  Subjective/Objective Assessment:   46 yr old female s/p L3-4, L4-5 lumbar laminectomy decompression     Action/Plan:   CM spoke with patient regarding DME needs,No home health therapy needs per PT/OT. Family to assist at discharge.   Anticipated DC Date:  04/08/2012   Anticipated DC Plan:  HOME/SELF CARE      DC Planning Services  CM consult      PAC Choice  DURABLE MEDICAL EQUIPMENT   Choice offered to / List presented to:     DME arranged  Levan Hurst      DME agency  [facility]        Status of service:  Completed, signed off Medicare Important Message given?   (If response is "NO", the following Medicare IM given date fields will be blank) Date Medicare IM given:   Date Additional Medicare IM given:    Discharge Disposition:  HOME/SELF CARE  Per UR Regulation:    If discussed at Long Length of Stay Meetings, dates discussed:    Comments:

## 2012-04-07 NOTE — Progress Notes (Addendum)
Patient ID: [name], female   DOB: 1965-11-18, 46 y.o.   MRN: [id number] When changed to Dilaudid patient's headache is better back pain is somewhat better. She is complaining of right leg pain side opposite her surgery. Pain that she describes of a nervelike. With standing and ambulation. Will place her on a steriod dose pak and gabapentin and provide her with decongestant. She was only able to ambulate a few steps this a.m. Physical therapy will see her again this afternoon and help [place] decide whether she'll be ready today or if she requires additional day in the hospital. Presently she is not capable of ambulating enough distance in order to be discharged and is describing discomfort at this time greater than I would feel comfortable signing out with dyspnea.

## 2012-04-08 MED ORDER — HYDROCODONE-ACETAMINOPHEN 5-325 MG PO TABS: 1.0000 | ORAL_TABLET | ORAL | Status: DC | PRN

## 2012-04-08 MED ORDER — GABAPENTIN 300 MG PO CAPS: 300.0000 mg | ORAL_CAPSULE | Freq: Every day | ORAL | Status: DC

## 2012-04-08 MED ORDER — HYDROMORPHONE HCL 4 MG PO TABS: 4.0000 mg | ORAL_TABLET | ORAL | Status: DC | PRN

## 2012-04-08 MED ORDER — HYDROMORPHONE HCL 4 MG PO TABS: 4.0000 mg | ORAL_TABLET | ORAL | Status: AC | PRN

## 2012-04-08 MED ORDER — METHOCARBAMOL 500 MG PO TABS: 500.0000 mg | ORAL_TABLET | Freq: Four times a day (QID) | ORAL | Status: AC | PRN

## 2012-04-08 MED ORDER — HYDROCODONE-ACETAMINOPHEN 5-325 MG PO TABS: 1.0000 | ORAL_TABLET | ORAL | Status: AC | PRN

## 2012-04-08 MED ORDER — METHOCARBAMOL 500 MG PO TABS: 500.0000 mg | ORAL_TABLET | Freq: Four times a day (QID) | ORAL | Status: DC | PRN

## 2012-04-08 NOTE — Progress Notes (Signed)
Occupational Therapy Treatment Patient Details Name: [name] MRN: [id number] DOB: 11-Jul-1966 Today's Date: 04/08/2012 Time: 5621-3086 OT Time Calculation (min): 17 min  OT Assessment / Plan / Recommendation Comments on Treatment Session Pt progressing very well. All education completed    Follow Up Recommendations  No OT follow up    Barriers to Discharge       Equipment Recommendations  None recommended by PT;None recommended by OT    Recommendations for Other Services    Frequency Min 2X/week   Plan Discharge plan remains appropriate    Precautions / Restrictions Precautions Precautions: Back Precaution Booklet Issued: Yes (comment) Precaution Comments: Pt able to recall 3/3 back precautions Restrictions Weight Bearing Restrictions:  (back precautions)   Pertinent Vitals/Pain Pt denies any pain at this time    ADL  Grooming: Simulated;Teeth care;Supervision/safety Where Assessed - Grooming: Unsupported standing (VC for maintain back precautions) Upper Body Bathing: Simulated;Supervision/safety Where Assessed - Upper Body Bathing: Unsupported sit to stand Lower Body Bathing: Simulated;Minimal assistance Where Assessed - Lower Body Bathing: Unsupported sit to stand Upper Body Dressing: Simulated;Set up Where Assessed - Upper Body Dressing: Unsupported sitting Lower Body Dressing: Simulated;Moderate assistance (educated pt on use of AE for LB dsg; declined practice) Where Assessed - Lower Body Dressing: Unsupported sit to stand Toilet Transfer: Performed;Modified independent Toilet Transfer Method: Sit to Barista: Materials engineer and Hygiene: Simulated;Minimal assistance ((hygiene)) Where Assessed - Engineer, mining and Hygiene: Sit on 3-in-1 or toilet Tub/Shower Transfer: Engineer, manufacturing Method: Science writer: Walk in shower;Grab  bars;Shower seat with back Equipment Used: Gait belt;Rolling walker Transfers/Ambulation Related to ADLs: Mod I with ambulation in room    OT Diagnosis:    OT Problem List:   OT Treatment Interventions:     OT Goals ADL Goals ADL Goal: Grooming - Progress: Progressing toward goals ADL Goal: Lower Body Bathing - Progress: Progressing toward goals ADL Goal: Lower Body Dressing - Progress: Progressing toward goals ADL Goal: Toilet Transfer - Progress: Met ADL Goal: Toileting - Clothing Manipulation - Progress: Progressing toward goals ADL Goal: Toileting - Hygiene - Progress: Progressing toward goals Miscellaneous OT Goals OT Goal: Miscellaneous Goal #1 - Progress: Met  Visit Information  Last OT Received On: 04/08/12 Assistance Needed: +1    Subjective Data      Prior Functioning  Home Living Lives With: Spouse;Family Available Help at Discharge: Family Type of Home: House Home Access: Stairs to enter Secretary/administrator of Steps: 1 Entrance Stairs-Rails: Can reach both Home Layout: Two level Alternate Level Stairs-Number of Steps: 10 Alternate Level Stairs-Rails: Left Bathroom Shower/Tub: Walk-in shower;Door [employer] Toilet: Standard Bathroom Accessibility: Yes How Accessible: Accessible via walker Home Adaptive Equipment: Grab bars in shower;Hand-held shower hose;Walker - rolling (seat in shower) Prior Function Level of Independence: Independent Able to Take Stairs?: Yes Driving: Yes Vocation: Unemployed Communication Communication: No difficulties Dominant Hand: Right    Cognition  Overall Cognitive Status: Appears within functional limits for tasks assessed/performed Arousal/Alertness: Awake/alert Orientation Level: Appears intact for tasks assessed Behavior During Session: [facility] for tasks performed    Mobility Bed Mobility Bed Mobility: Not assessed Rolling Right: 4: Min guard;With rail Right Sidelying to Sit: 4: Min guard;With rails Details for Bed  Mobility Assistance: Minguard for safety with cues for proper technique and hand placement Transfers Sit to Stand: 6: Modified independent (Device/Increase time);From chair/3-in-1;With armrests Stand to Sit: 6: Modified independent (Device/Increase time);To chair/3-in-1;With armrests Details for Transfer Assistance:  good hand placement   Exercises    Balance    End of Session OT - End of Session Equipment Utilized During Treatment: Gait belt Activity Tolerance: Patient tolerated treatment well Patient left: in chair;with call bell/phone within reach;with family/visitor present Nurse Communication: Mobility status  GO     Lewellyn Fultz 04/08/2012, 10:34 AM

## 2012-04-08 NOTE — Progress Notes (Signed)
Physical Therapy Progress Note  04/08/12 0826  PT Visit Information  Last PT Received On 04/08/12  Assistance Needed +1  PT Time Calculation  PT Start Time 0804  PT Stop Time 0825  PT Time Calculation (min) 21 min  Subjective Data  Subjective "I'm ready to go home today."  Patient Stated Goal To go home  Precautions  Precautions Back  Precaution Booklet Issued Yes (comment)  Precaution Comments Pt able to recall 3/3 back precautions  Cognition  Overall Cognitive Status Appears within functional limits for tasks assessed/performed  Arousal/Alertness Awake/alert  Orientation Level Appears intact for tasks assessed  Behavior During Session [facility] for tasks performed  Transfers  Transfers Sit to Stand;Stand to Sit  Sit to Stand 6: Modified independent (Device/Increase time)  Stand to Sit 6: Modified independent (Device/Increase time)  Ambulation/Gait  Ambulation/Gait Assistance 6: Modified independent (Device/Increase time)  Ambulation Distance (Feet) 150 Feet  Assistive device Rolling walker  Stairs Yes  Stairs Assistance 4: Min assist  Stairs Assistance Details (indicate cue type and reason) HHA to maintain balance   Stair Management Technique One rail Left;Forwards  Number of Stairs 10   PT - End of Session  Equipment Utilized During Treatment Gait belt  Activity Tolerance Patient tolerated treatment well  Patient left in chair;with call bell/phone within reach;with family/visitor present  Nurse Communication Mobility status  PT - Assessment/Plan  Comments on Treatment Session Pt making great progress and ready to d/c home today.  Will continue to follow pt if unable to d/c today.  Pt able to increase ambulation and perform stair negotiation.  Encouraged mobility at home and to continue to ambulate for main source of exercise.  PT Plan Discharge plan remains appropriate;Frequency remains appropriate  PT Frequency Min 5X/week  Follow Up Recommendations No PT follow up    Equipment Recommended None recommended by PT  Acute Rehab PT Goals  PT Goal Formulation With patient  Time For Goal Achievement 04/14/12  Potential to Achieve Goals Good  Pt will Roll Supine to Right Side with modified independence  PT Goal: Rolling Supine to Right Side - Progress Met  Pt will go Supine/Side to Sit with modified independence  PT Goal: Supine/Side to Sit - Progress Met  Pt will go Sit to Supine/Side with modified independence  PT Goal: Sit to Supine/Side - Progress Met  Pt will go Sit to Stand with modified independence  PT Goal: Sit to Stand - Progress Met  Pt will go Stand to Sit with modified independence  PT Goal: Stand to Sit - Progress Met  Pt will Ambulate >150 feet;with modified independence;with least restrictive assistive device  PT Goal: Ambulate - Progress Met  Pt will Go Up / Down Stairs with least restrictive assistive device;with supervision;6-9 stairs  PT Goal: Up/Down Stairs - Progress Partly met  Additional Goals  Additional Goal #1 Pt able to recall and adhere to 3/3 back precautions.  PT Goal: Additional Goal #1 - Progress Met   [place], [place] DPT [phone]

## 2012-04-08 NOTE — Progress Notes (Signed)
Patient examined and note reviewed. 

## 2012-04-08 NOTE — Progress Notes (Signed)
Subjective: 2 Days Post-Op Procedure(s) (LRB): LUMBAR LAMINECTOMY/DECOMPRESSION MICRODISCECTOMY (N/A) Patient reports pain as mild.  Leg pain and headache improved. Improved with Dilaudid for pain.  Using vicodin at home prior to surgery. Oral steroids and neurontin helpful for pain in legs. Voiding well.  Eating well.  No bm but having flatus.  Objective: Vital signs in last 24 hours: Temp:  [97.6 F (36.4 C)-98.9 F (37.2 C)] 98.3 F (36.8 C) (07/31 0529) Pulse Rate:  [88-106] 88  (07/31 0529) Resp:  [16-18] 18  (07/31 0529) BP: (96-118)/(58-75) 96/62 mmHg (07/31 0529) SpO2:  [93 %-99 %] 94 % (07/31 0829)  Intake/Output from previous day: 07/30 0701 - 07/31 0700 In: 270 [P.O.:270] Out: 1700 [Urine:1700] Intake/Output this shift:    No results found for this basename: HGB:5 in the last 72 hours No results found for this basename: WBC:2,RBC:2,HCT:2,PLT:2 in the last 72 hours No results found for this basename: NA:2,K:2,CL:2,CO2:2,BUN:2,CREATININE:2,GLUCOSE:2,CALCIUM:2 in the last 72 hours No results found for this basename: LABPT:2,INR:2 in the last 72 hours  Neurovascular intact Sensation intact distally Intact pulses distally Dorsiflexion/Plantar flexion intact Incision: no drainage  Assessment/Plan: 2 Days Post-Op Procedure(s) (LRB): LUMBAR LAMINECTOMY/DECOMPRESSION MICRODISCECTOMY (N/A) Up with therapy for ambulation and stairs. Discharge home. RX dilaudid for 2 days, then use hydrocodone.  Robaxin, Neurontin and remainder of steroid dose pak Ov 2 weeks Walk as tolerated. COD stable  Maegan Buller M 04/08/2012, 8:31 AM 2

## 2012-04-17 NOTE — Discharge Summary (Signed)
Physician Discharge Summary  Patient ID: [name] MRN: [id number] DOB/AGE: [birth year] 46 y.o.  Admit date: 04/06/2012 Discharge date: 04/17/2012  Admission Diagnoses:  Lumbar spinal stenosis and lateral recess stenosis left L3-4 and left L4-5 Discharge Diagnoses:  Principal Problem:  *Lumbar spinal stenosis Lateral recess stenosis Left L3-4 and Left L4-5  Past Medical History  Diagnosis Date  . Allergic rhinitis   . Hypertension   . COPD (chronic obstructive pulmonary disease)   . Shortness of breath   . Asthma   . GERD (gastroesophageal reflux disease)     Surgeries: Procedure(s): LUMBAR LAMINECTOMY/DECOMPRESSION MICRODISCECTOMY on 04/06/2012  Lateral recess decompression left L3-4 and left L4-5 with MIS equipment and microscope Consultants (if any):    Discharged Condition: Improved  Hospital Course: [name] is an 46 y.o. female who was admitted 04/06/2012 with a diagnosis of Lumbar spinal stenosis and went to the operating room on 04/06/2012 and underwent the above named procedures.    She was given perioperative antibiotics:  Anti-infectives     Start     Dose/Rate Route Frequency Ordered Stop   04/06/12 1600   ceFAZolin (ANCEF) IVPB 1 g/50 mL premix        1 g 100 mL/hr over 30 Minutes Intravenous Every 8 hours 04/06/12 1215 04/07/12 0001   04/05/12 1428   ceFAZolin (ANCEF) IVPB 2 g/50 mL premix        2 g 100 mL/hr over 30 Minutes Intravenous 60 min pre-op 04/05/12 1428 04/06/12 0814        .  She was given sequential compression devices, early ambulation.  She benefited maximally from the hospital stay and there were no complications.    Recent vital signs:  Filed Vitals:   04/08/12 0529  BP: 96/62  Pulse: 88  Temp: 98.3 F (36.8 C)  Resp: 18    Recent laboratory studies:  Lab Results  Component Value Date   HGB 16.8* 03/31/2012   Lab Results  Component Value Date   WBC 12.4* 03/31/2012   PLT 263 03/31/2012   No results found for  this basename: INR   Lab Results  Component Value Date   NA 137 03/31/2012   K 3.7 03/31/2012   CL 98 03/31/2012   CO2 26 03/31/2012   BUN 8 03/31/2012   CREATININE 0.66 03/31/2012   GLUCOSE 89 03/31/2012    Discharge Medications:   Medication List  As of 04/17/2012 10:59 AM   TAKE these medications         albuterol (2.5 MG/3ML) 0.083% nebulizer solution   Commonly known as: PROVENTIL   Take 2.5 mg by nebulization every 4 (four) hours as needed. For wheezing      albuterol 108 (90 BASE) MCG/ACT inhaler   Commonly known as: PROVENTIL HFA;VENTOLIN HFA   Inhale 2 puffs into the lungs every 6 (six) hours as needed.      budesonide-formoterol 160-4.5 MCG/ACT inhaler   Commonly known as: SYMBICORT   Inhale 2 puffs into the lungs 2 (two) times daily.      ergocalciferol 50000 UNITS capsule   Commonly known as: VITAMIN D2   Take 50,000 Units by mouth once a week. Wednesdays      fluticasone 50 MCG/ACT nasal spray   Commonly known as: FLONASE   Place 2 sprays into the nose daily.      gabapentin 300 MG capsule   Commonly known as: NEURONTIN   Take 1 capsule (300 mg total) by mouth at bedtime.  HYDROcodone-acetaminophen 5-325 MG per tablet   Commonly known as: NORCO/VICODIN   Take 1-2 tablets by mouth every 4 (four) hours as needed.      HYDROcodone-acetaminophen 5-325 MG per tablet   Commonly known as: NORCO/VICODIN   Take 1 tablet by mouth every 4 (four) hours as needed. For pain      HYDROmorphone 4 MG tablet   Commonly known as: DILAUDID   Take 1 tablet (4 mg total) by mouth every 4 (four) hours as needed for pain.      losartan 50 MG tablet   Commonly known as: COZAAR   Take 50 mg by mouth daily.      methocarbamol 500 MG tablet   Commonly known as: ROBAXIN   Take 1 tablet (500 mg total) by mouth every 6 (six) hours as needed.      omeprazole 20 MG capsule   Commonly known as: PRILOSEC   Take 20 mg by mouth daily.      TUDORZA PRESSAIR 400 MCG/ACT Aepb    Generic drug: Aclidinium Bromide   Inhale 1 puff into the lungs 2 (two) times daily.            Diagnostic Studies: Dg Chest 2 View  03/31/2012  *RADIOLOGY REPORT*  Clinical Data: Preoperative evaluation.  CHEST - 2 VIEW  Comparison: Chest x-ray 04/15/2005.  Findings: Lung volumes are normal.  No consolidative airspace disease.  No pleural effusions.  No pneumothorax.  No pulmonary nodule or mass noted.  Pulmonary vasculature and the cardiomediastinal silhouette are within normal limits.  Orthopedic fixation hardware in the lower cervical spine is incompletely visualized.  IMPRESSION: 1. No radiographic evidence of acute cardiopulmonary disease.  Original Report Authenticated By: Florencia Reasons, M.D.   Dg Lumbar Spine 1 View  04/06/2012  *RADIOLOGY REPORT*  Clinical Data: Decompression L3-4 and L4-5.  LUMBAR SPINE - 1 VIEW  Comparison: Myelography 02/28/2012  Findings: Single C-arm image shows tissue spreaders posteriorly with a probe between the spinous processes of L3 and L4 directed towards the L4-5 disc level.  IMPRESSION: L4-5 disc level localized.  Original Report Authenticated By: Thomasenia Sales, M.D.   Dg C-arm 1-60 Min  04/10/2012  *RADIOLOGY REPORT*  Clinical Data: Decompression L3-4 and L4-5.  LUMBAR SPINE - 1 VIEW  Comparison: Myelography 02/28/2012  Findings: Single C-arm image shows tissue spreaders posteriorly with a probe between the spinous processes of L3 and L4 directed towards the L4-5 disc level.  IMPRESSION: L4-5 disc level localized.  Original Report Authenticated By: Thomasenia Sales, M.D.    Disposition: 01-Home or Self Care  Discharge Orders    Future Orders Please Complete By Expires   Diet - low sodium heart healthy      Call MD / Call 911      Comments:   If you experience chest pain or shortness of breath, CALL 911 and be transported to the hospital emergency room.  If you develope a fever above 101 F, pus (white drainage) or increased drainage or redness at  the wound, or calf pain, call your surgeon's office.   Constipation Prevention      Comments:   Drink plenty of fluids.  Prune juice may be helpful.  You may use a stool softener, such as Colace (over the counter) 100 mg twice a day.  Use MiraLax (over the counter) for constipation as needed.   Increase activity slowly as tolerated      Discharge instructions      Comments:  Ambulate as tolerated.  Ice to back as needed.  Change dressing daily or as needed.  May shower with wound covered.   Lifting restrictions      Comments:   No lifting   Driving restrictions      Comments:   No driving      Follow-up Information    Follow up with NITKA,JAMES E, MD. Schedule an appointment as soon as possible for a visit in 2 weeks.   Contact information:   [employer] [address] [place] [address] [phone]           Signed: Wende Neighbors 04/17/2012, 10:59 AM

## 2012-04-17 NOTE — Discharge Summary (Signed)
Patient examined and d/c smmary reviewed with Dondra Spry.

## 2012-04-29 ENCOUNTER — Telehealth: Payer: Self-pay | Admitting: Pulmonary Disease

## 2012-04-29 MED ORDER — ACLIDINIUM BROMIDE 400 MCG/ACT IN AEPB: 1.0000 | INHALATION_SPRAY | Freq: Two times a day (BID) | RESPIRATORY_TRACT | Status: DC

## 2012-04-29 NOTE — Telephone Encounter (Addendum)
Per 7.1.13 patient instructions:  Patient Instructions     Continue on symbicort as you are doing  Will try tudorza one inhalation each am and pm for next 4weeks. Can continue this is you feel it has helped you.  Quitting or decreasing smoking 2 weeks before general anesthesia greatly reduces your pulmonary risks for surgery.  Get out of bed as soon as it is allowed by your surgeon, and use incentive spirometer every hour while awake. This reduces your risk for postoperative pneumonia.  followup with me after your back surgery to make sure you are doing ok.     Called spoke with patient who reports that her DOE, wheezing, and cough have improved with the tudorza and would [facility].  Asked patient if she needs a short-term rx sent to her local pharmacy, and pt declined.  Advised to please call if she decides she would a script sent.  Rx sent to [facility].

## 2012-04-29 NOTE — Telephone Encounter (Signed)
Will sign and forward to KC as FYI 

## 2012-08-05 ENCOUNTER — Other Ambulatory Visit: Payer: Self-pay | Admitting: Specialist

## 2012-08-05 DIAGNOSIS — M79604 Pain in right leg: Secondary | ICD-10-CM

## 2012-08-05 DIAGNOSIS — M545 Low back pain: Secondary | ICD-10-CM

## 2012-08-05 DIAGNOSIS — R531 Weakness: Secondary | ICD-10-CM

## 2012-08-05 DIAGNOSIS — M79605 Pain in left leg: Secondary | ICD-10-CM

## 2012-08-11 ENCOUNTER — Inpatient Hospital Stay: Admission: RE | Admit: 2012-08-11 | Payer: BC Managed Care – PPO | Source: Ambulatory Visit

## 2012-08-11 ENCOUNTER — Other Ambulatory Visit: Payer: BC Managed Care – PPO

## 2012-08-17 IMAGING — RF DG LUMBAR SPINE 1V
1 series · 1 of 1 positions shown · non-contrast
Comparison: Myelography 02/28/2012

CLINICAL DATA: Decompression L3-4 and L4-5.

LUMBAR SPINE - 1 VIEW

[Series 1: run · 1 of 1 slices shown]
[im 1/1]
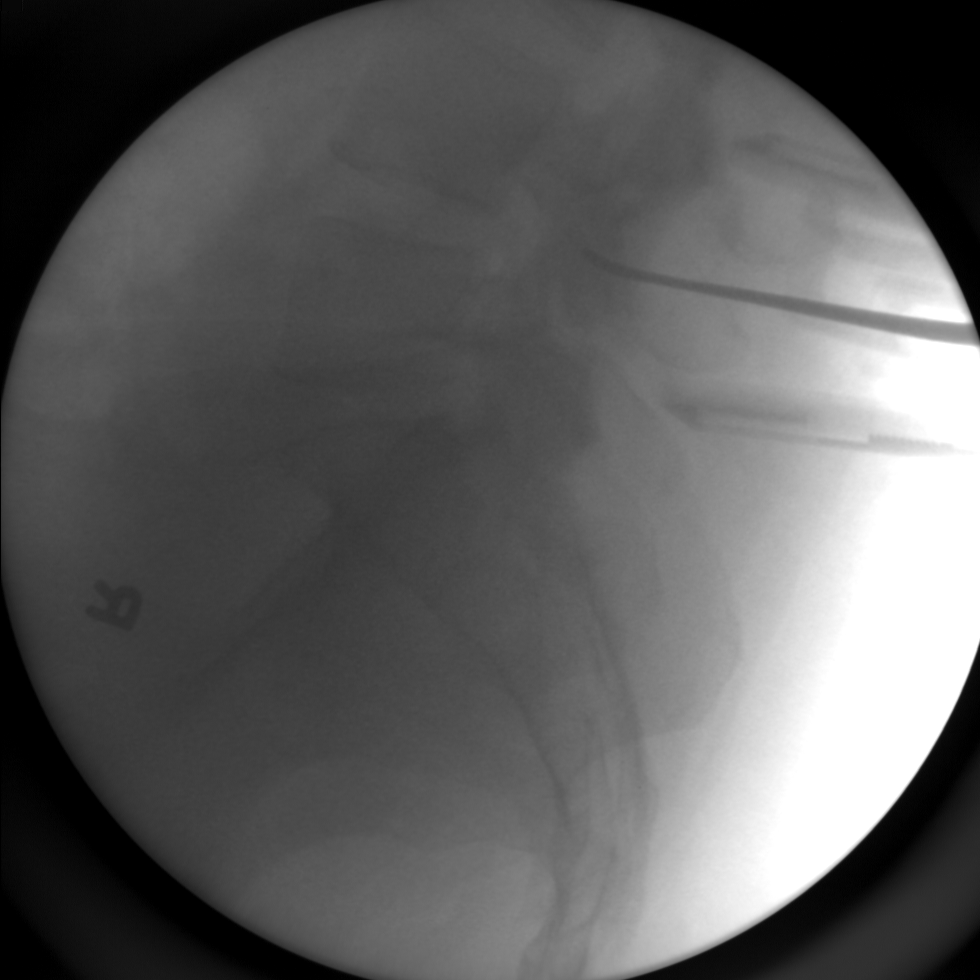

[1 of 1 positions shown; findings below may reference images not displayed]

FINDINGS: Single C-arm image shows tissue spreaders posteriorly
with a probe between the spinous processes of L3 and L4 directed
towards the L4-5 disc level.
IMPRESSION: L4-5 disc level localized.

## 2012-08-18 ENCOUNTER — Ambulatory Visit
Admission: RE | Admit: 2012-08-18 | Discharge: 2012-08-18 | Disposition: A | Discharge status: Home / Self Care | Payer: BC Managed Care – PPO | Source: Ambulatory Visit | Attending: Specialist | Admitting: Specialist

## 2012-08-18 DIAGNOSIS — M79604 Pain in right leg: Secondary | ICD-10-CM

## 2012-08-18 DIAGNOSIS — M79605 Pain in left leg: Secondary | ICD-10-CM

## 2012-08-18 DIAGNOSIS — R531 Weakness: Secondary | ICD-10-CM

## 2012-08-18 DIAGNOSIS — M545 Low back pain: Secondary | ICD-10-CM

## 2012-08-18 MED ORDER — GADOBENATE DIMEGLUMINE 529 MG/ML IV SOLN
7.0000 mL | Freq: Once | INTRAVENOUS | Status: AC | PRN
  Administered 2012-08-18: 7 mL via INTRAVENOUS

## 2013-04-30 ENCOUNTER — Other Ambulatory Visit: Payer: Self-pay | Admitting: Pulmonary Disease

## 2013-11-23 ENCOUNTER — Other Ambulatory Visit (HOSPITAL_COMMUNITY): Payer: Self-pay | Admitting: Family Medicine

## 2013-11-23 DIAGNOSIS — I10 Essential (primary) hypertension: Secondary | ICD-10-CM

## 2013-11-25 ENCOUNTER — Ambulatory Visit (HOSPITAL_COMMUNITY)
Admission: RE | Admit: 2013-11-25 | Discharge: 2013-11-25 | Disposition: A | Discharge status: Home / Self Care | Payer: BC Managed Care – PPO | Source: Ambulatory Visit | Attending: Cardiovascular Disease | Admitting: Cardiovascular Disease

## 2013-11-25 DIAGNOSIS — R0989 Other specified symptoms and signs involving the circulatory and respiratory systems: Principal | ICD-10-CM | POA: Insufficient documentation

## 2013-11-25 DIAGNOSIS — I10 Essential (primary) hypertension: Secondary | ICD-10-CM | POA: Insufficient documentation

## 2013-11-25 DIAGNOSIS — R0602 Shortness of breath: Secondary | ICD-10-CM

## 2013-11-25 DIAGNOSIS — R0609 Other forms of dyspnea: Secondary | ICD-10-CM | POA: Insufficient documentation

## 2013-11-25 NOTE — Progress Notes (Signed)
2D Echo Performed 11/25/2013    Orvilla Truett, RCS  

## 2014-02-14 ENCOUNTER — Other Ambulatory Visit: Payer: Self-pay | Admitting: Orthopedic Surgery

## 2014-07-12 ENCOUNTER — Other Ambulatory Visit: Payer: Self-pay | Admitting: Orthopedic Surgery

## 2014-08-17 ENCOUNTER — Ambulatory Visit (HOSPITAL_COMMUNITY)
Admission: RE | Admit: 2014-08-17 | Payer: BC Managed Care – PPO | Source: Ambulatory Visit | Admitting: Orthopedic Surgery

## 2014-08-17 ENCOUNTER — Encounter (HOSPITAL_COMMUNITY): Admission: RE | Payer: Self-pay | Source: Ambulatory Visit

## 2014-08-17 SURGERY — ANTERIOR CERVICAL DECOMPRESSION/DISCECTOMY FUSION 1 LEVEL
Anesthesia: General

## 2015-04-17 ENCOUNTER — Other Ambulatory Visit: Payer: Self-pay | Admitting: Pain Medicine

## 2015-04-17 DIAGNOSIS — M542 Cervicalgia: Secondary | ICD-10-CM

## 2015-04-19 ENCOUNTER — Ambulatory Visit
Admission: RE | Admit: 2015-04-19 | Discharge: 2015-04-19 | Disposition: A | Discharge status: Home / Self Care | Payer: BLUE CROSS/BLUE SHIELD | Source: Ambulatory Visit | Attending: Pain Medicine | Admitting: Pain Medicine

## 2015-04-19 ENCOUNTER — Other Ambulatory Visit: Payer: Self-pay | Admitting: Pain Medicine

## 2015-04-19 DIAGNOSIS — M545 Low back pain: Secondary | ICD-10-CM

## 2015-04-19 DIAGNOSIS — M542 Cervicalgia: Secondary | ICD-10-CM

## 2015-07-01 ENCOUNTER — Ambulatory Visit
Admission: RE | Admit: 2015-07-01 | Discharge: 2015-07-01 | Disposition: A | Discharge status: Home / Self Care | Payer: BLUE CROSS/BLUE SHIELD | Source: Ambulatory Visit | Attending: Pain Medicine | Admitting: Pain Medicine

## 2015-07-01 DIAGNOSIS — M542 Cervicalgia: Secondary | ICD-10-CM

## 2015-07-01 DIAGNOSIS — M545 Low back pain: Secondary | ICD-10-CM

## 2016-05-20 ENCOUNTER — Telehealth (HOSPITAL_COMMUNITY): Payer: Self-pay

## 2016-05-20 NOTE — Telephone Encounter (Signed)
Called patient in regards to Pulmonary Rehab referral from Dr. Cyndia BentBadger. Left message. Will follow up again.

## 2016-06-03 ENCOUNTER — Telehealth (HOSPITAL_COMMUNITY): Payer: Self-pay

## 2016-06-03 NOTE — Telephone Encounter (Signed)
LMTCB regarding Pulmonary Rehab

## 2016-06-07 ENCOUNTER — Telehealth (HOSPITAL_COMMUNITY): Payer: Self-pay

## 2016-06-07 NOTE — Telephone Encounter (Signed)
Called and left message with patient regarding Pulmonary Rehab referral. This was the third message left with no response.

## 2016-12-25 ENCOUNTER — Other Ambulatory Visit: Payer: Self-pay | Admitting: Pain Medicine

## 2016-12-25 DIAGNOSIS — M545 Low back pain: Secondary | ICD-10-CM

## 2017-01-11 ENCOUNTER — Ambulatory Visit
Admission: RE | Admit: 2017-01-11 | Discharge: 2017-01-11 | Disposition: A | Discharge status: Home / Self Care | Payer: BLUE CROSS/BLUE SHIELD | Source: Ambulatory Visit | Attending: Pain Medicine | Admitting: Pain Medicine

## 2017-01-11 DIAGNOSIS — M545 Low back pain: Secondary | ICD-10-CM

## 2017-07-20 ENCOUNTER — Encounter (HOSPITAL_COMMUNITY): Payer: Self-pay | Admitting: Anesthesiology

## 2017-07-20 ENCOUNTER — Emergency Department (HOSPITAL_COMMUNITY): Payer: BLUE CROSS/BLUE SHIELD

## 2017-07-20 ENCOUNTER — Inpatient Hospital Stay (HOSPITAL_COMMUNITY)
Admission: EM | Admit: 2017-07-20 | Discharge: 2017-07-25 | DRG: 493 | Disposition: A | Discharge status: Home Health | Payer: BLUE CROSS/BLUE SHIELD | Attending: Family Medicine | Admitting: Family Medicine

## 2017-07-20 ENCOUNTER — Encounter (HOSPITAL_COMMUNITY): Payer: Self-pay

## 2017-07-20 ENCOUNTER — Encounter (HOSPITAL_COMMUNITY)
Admission: EM | Disposition: A | Discharge status: Home Health | Payer: Self-pay | Source: Home / Self Care | Attending: Family Medicine

## 2017-07-20 DIAGNOSIS — Z881 Allergy status to other antibiotic agents status: Secondary | ICD-10-CM

## 2017-07-20 DIAGNOSIS — J449 Chronic obstructive pulmonary disease, unspecified: Secondary | ICD-10-CM

## 2017-07-20 DIAGNOSIS — W19XXXD Unspecified fall, subsequent encounter: Secondary | ICD-10-CM | POA: Diagnosis not present

## 2017-07-20 DIAGNOSIS — Z825 Family history of asthma and other chronic lower respiratory diseases: Secondary | ICD-10-CM

## 2017-07-20 DIAGNOSIS — Z7189 Other specified counseling: Secondary | ICD-10-CM | POA: Diagnosis not present

## 2017-07-20 DIAGNOSIS — R0902 Hypoxemia: Secondary | ICD-10-CM | POA: Diagnosis not present

## 2017-07-20 DIAGNOSIS — S42342A Displaced spiral fracture of shaft of humerus, left arm, initial encounter for closed fracture: Principal | ICD-10-CM | POA: Diagnosis present

## 2017-07-20 DIAGNOSIS — M25512 Pain in left shoulder: Secondary | ICD-10-CM | POA: Diagnosis not present

## 2017-07-20 DIAGNOSIS — I2510 Atherosclerotic heart disease of native coronary artery without angina pectoris: Secondary | ICD-10-CM | POA: Diagnosis present

## 2017-07-20 DIAGNOSIS — Z8261 Family history of arthritis: Secondary | ICD-10-CM | POA: Diagnosis not present

## 2017-07-20 DIAGNOSIS — Z419 Encounter for procedure for purposes other than remedying health state, unspecified: Secondary | ICD-10-CM | POA: Diagnosis not present

## 2017-07-20 DIAGNOSIS — M549 Dorsalgia, unspecified: Secondary | ICD-10-CM

## 2017-07-20 DIAGNOSIS — J441 Chronic obstructive pulmonary disease with (acute) exacerbation: Secondary | ICD-10-CM | POA: Diagnosis present

## 2017-07-20 DIAGNOSIS — Z9981 Dependence on supplemental oxygen: Secondary | ICD-10-CM

## 2017-07-20 DIAGNOSIS — S42332K Displaced oblique fracture of shaft of humerus, left arm, subsequent encounter for fracture with nonunion: Secondary | ICD-10-CM | POA: Diagnosis not present

## 2017-07-20 DIAGNOSIS — S42301K Unspecified fracture of shaft of humerus, right arm, subsequent encounter for fracture with nonunion: Secondary | ICD-10-CM

## 2017-07-20 DIAGNOSIS — Z7951 Long term (current) use of inhaled steroids: Secondary | ICD-10-CM | POA: Diagnosis not present

## 2017-07-20 DIAGNOSIS — E6609 Other obesity due to excess calories: Secondary | ICD-10-CM

## 2017-07-20 DIAGNOSIS — I10 Essential (primary) hypertension: Secondary | ICD-10-CM | POA: Diagnosis present

## 2017-07-20 DIAGNOSIS — W19XXXA Unspecified fall, initial encounter: Secondary | ICD-10-CM | POA: Diagnosis not present

## 2017-07-20 DIAGNOSIS — M48061 Spinal stenosis, lumbar region without neurogenic claudication: Secondary | ICD-10-CM | POA: Diagnosis present

## 2017-07-20 DIAGNOSIS — S42332A Displaced oblique fracture of shaft of humerus, left arm, initial encounter for closed fracture: Secondary | ICD-10-CM | POA: Diagnosis present

## 2017-07-20 DIAGNOSIS — R55 Syncope and collapse: Secondary | ICD-10-CM | POA: Diagnosis present

## 2017-07-20 DIAGNOSIS — W1830XA Fall on same level, unspecified, initial encounter: Secondary | ICD-10-CM | POA: Diagnosis present

## 2017-07-20 DIAGNOSIS — S42202A Unspecified fracture of upper end of left humerus, initial encounter for closed fracture: Secondary | ICD-10-CM | POA: Diagnosis not present

## 2017-07-20 DIAGNOSIS — F17210 Nicotine dependence, cigarettes, uncomplicated: Secondary | ICD-10-CM | POA: Diagnosis present

## 2017-07-20 DIAGNOSIS — J309 Allergic rhinitis, unspecified: Secondary | ICD-10-CM | POA: Diagnosis present

## 2017-07-20 DIAGNOSIS — Z6837 Body mass index (BMI) 37.0-37.9, adult: Secondary | ICD-10-CM

## 2017-07-20 DIAGNOSIS — K219 Gastro-esophageal reflux disease without esophagitis: Secondary | ICD-10-CM | POA: Diagnosis present

## 2017-07-20 DIAGNOSIS — G8929 Other chronic pain: Secondary | ICD-10-CM | POA: Diagnosis present

## 2017-07-20 DIAGNOSIS — G4733 Obstructive sleep apnea (adult) (pediatric): Secondary | ICD-10-CM | POA: Diagnosis present

## 2017-07-20 DIAGNOSIS — Z801 Family history of malignant neoplasm of trachea, bronchus and lung: Secondary | ICD-10-CM

## 2017-07-20 DIAGNOSIS — G894 Chronic pain syndrome: Secondary | ICD-10-CM | POA: Diagnosis not present

## 2017-07-20 LAB — COMPREHENSIVE METABOLIC PANEL
ALBUMIN: 3.9 g/dL (ref 3.5–5.0)
ALK PHOS: 75 U/L (ref 38–126)
ALT: 23 U/L (ref 14–54)
AST: 27 U/L (ref 15–41)
Anion gap: 9 (ref 5–15)
CALCIUM: 8.8 mg/dL — AB (ref 8.9–10.3)
CO2: 28 mmol/L (ref 22–32)
Chloride: 95 mmol/L — ABNORMAL LOW (ref 101–111)
Creatinine, Ser: 0.65 mg/dL (ref 0.44–1.00)
GFR calc Af Amer: >60 mL/min (ref >60)
GFR calc non Af Amer: >60 mL/min (ref >60)
GLUCOSE: 107 mg/dL — AB (ref 65–99)
POTASSIUM: 4.5 mmol/L (ref 3.5–5.1)
SODIUM: 132 mmol/L — AB (ref 135–145)
Total Bilirubin: 0.6 mg/dL (ref 0.3–1.2)
Total Protein: 6.3 g/dL — ABNORMAL LOW (ref 6.5–8.1)

## 2017-07-20 LAB — CBC WITH DIFFERENTIAL/PLATELET
BASOS ABS: 0 10*3/uL (ref 0.0–0.1)
BASOS PCT: 0 %
EOS ABS: 0 10*3/uL (ref 0.0–0.7)
Eosinophils Relative: 0 %
HEMATOCRIT: 45.8 % (ref 36.0–46.0)
HEMOGLOBIN: 15.1 g/dL — AB (ref 12.0–15.0)
Lymphocytes Relative: 5 %
Lymphs Abs: 1.2 10*3/uL (ref 0.7–4.0)
MCH: 29.8 pg (ref 26.0–34.0)
MCHC: 33 g/dL (ref 30.0–36.0)
MCV: 90.3 fL (ref 78.0–100.0)
Monocytes Absolute: 1.3 10*3/uL — ABNORMAL HIGH (ref 0.1–1.0)
Monocytes Relative: 6 %
NEUTROS PCT: 89 %
Neutro Abs: 19.1 10*3/uL — ABNORMAL HIGH (ref 1.7–7.7)
Platelets: 231 10*3/uL (ref 150–400)
RBC: 5.07 MIL/uL (ref 3.87–5.11)
RDW: 14 % (ref 11.5–15.5)
WBC: 21.7 10*3/uL — AB (ref 4.0–10.5)

## 2017-07-20 LAB — I-STAT TROPONIN, ED: Troponin i, poc: 0.01 ng/mL (ref 0.00–0.08)

## 2017-07-20 LAB — BRAIN NATRIURETIC PEPTIDE: B Natriuretic Peptide: 27.1 pg/mL (ref 0.0–100.0)

## 2017-07-20 SURGERY — INSERTION, INTRAMEDULLARY ROD, HUMERUS
Anesthesia: General | Laterality: Left

## 2017-07-20 MED ORDER — FLUTICASONE PROPIONATE 50 MCG/ACT NA SUSP
1.0000 | Freq: Every day | NASAL | Status: DC
  Administered 2017-07-21 – 2017-07-25 (×4): 1 via NASAL
  Filled 2017-07-20: qty 16

## 2017-07-20 MED ORDER — IPRATROPIUM-ALBUTEROL 0.5-2.5 (3) MG/3ML IN SOLN
3.0000 mL | Freq: Four times a day (QID) | RESPIRATORY_TRACT | Status: DC
  Administered 2017-07-20 – 2017-07-21 (×2): 3 mL via RESPIRATORY_TRACT
  Filled 2017-07-20 (×3): qty 3

## 2017-07-20 MED ORDER — AZITHROMYCIN 250 MG PO TABS
250.0000 mg | ORAL_TABLET | Freq: Every day | ORAL | Status: DC
  Administered 2017-07-21 – 2017-07-25 (×4): 250 mg via ORAL
  Filled 2017-07-20 (×5): qty 1

## 2017-07-20 MED ORDER — LORATADINE 10 MG PO TABS
10.0000 mg | ORAL_TABLET | Freq: Every day | ORAL | Status: DC
  Administered 2017-07-21 – 2017-07-25 (×5): 10 mg via ORAL
  Filled 2017-07-20 (×6): qty 1

## 2017-07-20 MED ORDER — PREDNISONE 10 MG PO TABS
10.0000 mg | ORAL_TABLET | Freq: Every day | ORAL | Status: DC
Start: 2017-07-21 — End: 2017-07-25
  Administered 2017-07-21 – 2017-07-25 (×5): 10 mg via ORAL
  Filled 2017-07-20 (×6): qty 1

## 2017-07-20 MED ORDER — PANTOPRAZOLE SODIUM 40 MG PO TBEC
40.0000 mg | DELAYED_RELEASE_TABLET | Freq: Every day | ORAL | Status: DC
  Administered 2017-07-21 – 2017-07-25 (×5): 40 mg via ORAL
  Filled 2017-07-20 (×6): qty 1

## 2017-07-20 MED ORDER — LOSARTAN POTASSIUM 50 MG PO TABS
100.0000 mg | ORAL_TABLET | Freq: Every day | ORAL | Status: DC
  Administered 2017-07-21 – 2017-07-23 (×3): 100 mg via ORAL
  Filled 2017-07-20 (×6): qty 2

## 2017-07-20 MED ORDER — ALBUTEROL SULFATE (2.5 MG/3ML) 0.083% IN NEBU
2.5000 mg | INHALATION_SOLUTION | RESPIRATORY_TRACT | Status: DC | PRN
  Administered 2017-07-21 – 2017-07-25 (×5): 2.5 mg via RESPIRATORY_TRACT
  Filled 2017-07-20 (×5): qty 3

## 2017-07-20 MED ORDER — HYDROMORPHONE HCL 1 MG/ML IJ SOLN
0.5000 mg | Freq: Once | INTRAMUSCULAR | Status: AC
  Administered 2017-07-20: 0.5 mg via INTRAVENOUS
  Filled 2017-07-20: qty 1

## 2017-07-20 MED ORDER — ONDANSETRON HCL 4 MG/2ML IJ SOLN
4.0000 mg | Freq: Once | INTRAMUSCULAR | Status: AC
  Administered 2017-07-20: 4 mg via INTRAVENOUS
  Filled 2017-07-20: qty 2

## 2017-07-20 MED ORDER — GUAIFENESIN ER 600 MG PO TB12
1200.0000 mg | ORAL_TABLET | Freq: Every day | ORAL | Status: DC | PRN
  Administered 2017-07-24: 1200 mg via ORAL
  Filled 2017-07-20: qty 2

## 2017-07-20 MED ORDER — TIOTROPIUM BROMIDE MONOHYDRATE 2.5 MCG/ACT IN AERS: 1.0000 | INHALATION_SPRAY | Freq: Every day | RESPIRATORY_TRACT | Status: DC

## 2017-07-20 MED ORDER — MOMETASONE FURO-FORMOTEROL FUM 200-5 MCG/ACT IN AERO
2.0000 | INHALATION_SPRAY | Freq: Two times a day (BID) | RESPIRATORY_TRACT | Status: DC
  Administered 2017-07-21 – 2017-07-25 (×9): 2 via RESPIRATORY_TRACT
  Filled 2017-07-20: qty 8.8

## 2017-07-20 MED ORDER — HYDROCODONE-ACETAMINOPHEN 10-325 MG PO TABS
1.0000 | ORAL_TABLET | Freq: Three times a day (TID) | ORAL | Status: DC | PRN
  Administered 2017-07-20 – 2017-07-21 (×2): 1 via ORAL
  Filled 2017-07-20 (×2): qty 1

## 2017-07-20 MED ORDER — FUROSEMIDE 40 MG PO TABS: 40.0000 mg | ORAL_TABLET | Freq: Every day | ORAL | Status: DC | PRN

## 2017-07-20 MED ORDER — MONTELUKAST SODIUM 10 MG PO TABS
10.0000 mg | ORAL_TABLET | Freq: Every day | ORAL | Status: DC
  Administered 2017-07-20 – 2017-07-24 (×5): 10 mg via ORAL
  Filled 2017-07-20 (×5): qty 1

## 2017-07-20 MED ORDER — MORPHINE SULFATE (PF) 4 MG/ML IV SOLN
2.0000 mg | INTRAVENOUS | Status: DC | PRN
  Administered 2017-07-20 – 2017-07-21 (×3): 2 mg via INTRAVENOUS
  Filled 2017-07-20 (×3): qty 1

## 2017-07-20 MED ORDER — FENTANYL CITRATE (PF) 100 MCG/2ML IJ SOLN
100.0000 ug | Freq: Once | INTRAMUSCULAR | Status: AC
  Administered 2017-07-20: 100 ug via INTRAVENOUS
  Filled 2017-07-20: qty 2

## 2017-07-20 MED ORDER — FENTANYL CITRATE (PF) 100 MCG/2ML IJ SOLN
100.0000 ug | INTRAMUSCULAR | Status: AC | PRN
  Administered 2017-07-20: 100 ug via INTRAVENOUS
  Filled 2017-07-20: qty 2

## 2017-07-20 NOTE — ED Notes (Signed)
Patient transported to X-ray 

## 2017-07-20 NOTE — Anesthesia Preprocedure Evaluation (Deleted)
Anesthesia Evaluation  Patient identified by MRN, date of birth, ID band Patient awake    Reviewed: Allergy & Precautions, H&P , NPO status , Patient's Chart, lab work & pertinent test results  Airway Mallampati: II  TM Distance: <3 FB Neck ROM: Full    Dental  (+) Teeth Intact   Pulmonary shortness of breath and with exertion, asthma , COPD,  COPD inhaler and oxygen dependent, Current Smoker,    breath sounds clear to auscultation       Cardiovascular hypertension, Pt. on medications  Rhythm:Regular Rate:Normal  TTE 2015 - grade 1 diastolic dysfx, otherwise wnl   Neuro/Psych    GI/Hepatic Neg liver ROS, GERD  Medicated and Controlled,  Endo/Other  Obesity  Renal/GU negative Renal ROS     Musculoskeletal   Abdominal (+)  Abdomen: soft. Bowel sounds: normal.  Peds  Hematology negative hematology ROS (+)   Anesthesia Other Findings   Reproductive/Obstetrics                            Anesthesia Physical  Anesthesia Plan  ASA: III  Anesthesia Plan: General   Post-op Pain Management:    Induction: Intravenous  PONV Risk Score and Plan: 3 and Treatment may vary due to age or medical condition, Dexamethasone, Ondansetron and Midazolam  Airway Management Planned: Oral ETT  Additional Equipment: None  Intra-op Plan:   Post-operative Plan: Extubation in OR  Informed Consent: I have reviewed the patients History and Physical, chart, labs and discussed the procedure including the risks, benefits and alternatives for the proposed anesthesia with the patient or authorized representative who has indicated his/her understanding and acceptance.     Plan Discussed with: CRNA and Surgeon  Anesthesia Plan Comments: (No regional block per surgeon request)       Anesthesia Quick Evaluation

## 2017-07-20 NOTE — ED Provider Notes (Signed)
Patient presented to the emergency room for evaluation of a shoulder injury.  Patient states she was walking when she thinks her knee may have given out.  She fell landing on her left shoulder.  She also complains of some soreness in her left hip.  She denies any headache or head injury.  No chest pain or shortness of breath.  On exam patient does have tenderness palpation of the left shoulder joint.  No gross deformity. Mild tenderness palpation left elbow.  Mild tenderness palpation left hip.   Will proceed with evaluation including labs and xrays.    EKG Interpretation  Date/Time:  Sunday July 20 2017 15:46:11 EST Ventricular Rate:  100 PR Interval:    QRS Duration: 79 QT Interval:  331 QTC Calculation: 427 R Axis:   70 Text Interpretation:  Sinus tachycardia Low voltage, precordial leads Since last tracing rate faster Confirmed by Linwood DibblesKnapp, Vicky Schleich [phone]([id number]) on 07/20/2017 3:55:36 PM      I saw and evaluated the patient, reviewed the resident's note and I agree with the findings and plan.   Linwood DibblesKnapp, Tobyn Osgood, MD 07/20/17 [phone]

## 2017-07-20 NOTE — H&P (Signed)
Family Medicine Teaching [facility] Admission History and Physical Service Pager: [phone]  Patient name: [name] Medical record number: [id number] Date of birth: May 03, 1966 Age: 51 y.o. Gender: female  Primary Care Provider: [name], MD Consultants: Orthopedic surgery Code Status: Full code  Chief Complaint: Fall, shoulder pain  Assessment and Plan: [name] is a 51 y.o. female presenting after a ground-level fall resulting in left displaced spiral proximal humeral fracture. PMH is significant for severe COPD on 3-4 L at home, azithromycin and prednisone, morbid obesity, hypertension, chronic pain  Ground-level fall resulting left displaced spiral proximal humeral fracture Non mechanical fall. Denied any chest pain or increased shortness of breath, lightheadedness, palpitations or auras prior to the fall. No personal or FH seizures.  EKG within normal limits, point-of-care troponin 0.01 and patient denied CP. Has history of severe COPD on 3-4 L at home, azithromycin and prednisone daily.  She follows with a pulmonologist at [facility]. No LOC. No urinary and fecal incontinence and did not bite her tongue. Was not wearing oxygen at the time of fall. Fall on shoulder and did not hit her head. Decreased ROM of LUE 2/2 pain.  Left arm in sling. Sensation and pulses intact. Grip strength 5/5 bilaterally. Evaluated by orthopedics who plan to operate. Needs medical clearance prior to surgery. Patient is comfortable and in NAD.  Fall is most likely vasovagal. -admit to med-surg under attending [place] -Continue Norco 10 325 mg 3 times daily as needed; morphine 2 mg every 4 hours as needed severe pain -Orthopedics following; plan to operate tomorrow pending medical clearance -Pulmonary consult for medical clearance -Vitals per floor protocol -Follow-up echo -orthostatic vital signs  COPD Uses 3-4 L of oxygen at home.  Home medications include Symbicort, albuterol,  azithromycin daily, Zyrtec, Flonase, Mucinex, Singulair, prednisone 10 mg.  Reports that she takes nebulizers 3-4 times daily at baseline.  No report of increased sputum production fevers or increased shortness of breath.  Poor air movement on exam minimal scattered end expiratory wheezing.  -Continue 3-4 L oxygen for O2 greater than 90% -Pulmonary consult for medical clearance for surgery -Duonebs q6hrs scheduled, albuterol every 4 hours as needed, Dulera 2 puffs 2 times daily -azithromycin 250mg  daily -prednisone 10mg  daily  HTN Normotensive upon presentation.  -continue losartan 100mg  daily  Chronic pain Patient reports chronic leg and back pain. Takes norco 10-325mg  TID PRN daily.  - continue norco - morphine 2mg  q4hrs PRN severe pain  GERD Continue protonix 40mg  daily  FEN/GI: Cardiac diet; NPO at midnight, famotidine Prophylaxis: SCDs  Disposition: admit to med-surg; ortho following  History of Present Illness:  [name] is a 51 y.o. female presenting with left shoulder pain after a ground-level fall earlier today.  She reports that she was in her usual state of health and walks to the bathroom without her home oxygen went to the bathroom and stood up and walked about 10 feet and fell.  She is not on any blood thinners at this time.  She denied any chest pain, palpitations, worsening shortness of breath, lightheadedness, dizziness or auras.  She has no known coronary artery disease or heart failure.  She has no personal history of seizures or family history.  She reports that she has had good p.o. intake over the last 48 hours and denies any recent vomiting or diarrhea.  She has no family history of sudden cardiac death.  She currently denies any chest pain, shortness of breath, nausea, vomiting or  diarrhea.   In the ED she had x-ray of the shoulder showing a displaced spiral type fracture of the proximal humeral shaft on the right side.  X-ray of the forearm showed no fractures  and x-ray of the hip showed no fractures.  She was given Dilaudid for pain control and was evaluated by orthopedic surgery.  Family medicine was consulted by orthopedic surgery for admission for workup of syncope and they plan to operate on her displaced fracture.   Review Of Systems: Per HPI   ROS  Patient Active Problem List   Diagnosis Date Noted  . Lumbar spinal stenosis 04/03/2012    Class: Diagnosis of  . COPD (chronic obstructive pulmonary disease) (HCC) 05/31/2010  . ALLERGIC RHINITIS 05/09/2010  . DYSPNEA ON EXERTION 05/09/2010    Past Medical History: Past Medical History:  Diagnosis Date  . Allergic rhinitis   . Asthma   . COPD (chronic obstructive pulmonary disease) (HCC)   . GERD (gastroesophageal reflux disease)   . Hypertension   . Shortness of breath     Past Surgical History: Past Surgical History:  Procedure Laterality Date  . NECK SURGERY  Sept 2005  . TOTAL ABDOMINAL HYSTERECTOMY  Oct 2003  . TUBAL LIGATION  1995    Social History: Social History   Tobacco Use  . Smoking status: Current Every Day Smoker    Packs/day: 1.00    Years: 30.00    Pack years: 30.00    Types: Cigarettes  . Smokeless tobacco: Never Used  . Tobacco comment: currently smoking 1/2 ppd.   Substance Use Topics  . Alcohol use: Yes    Comment: occassionally  . Drug use: No    Family History: Family History  Problem Relation Age of Onset  . Emphysema Father   . Rheum arthritis Father   . Lung cancer Father   . Emphysema Mother     Allergies and Medications: Allergies  Allergen Reactions  . Amoxicillin-Pot Clavulanate Itching    Ok with benadryl  . Erythromycin Rash    Ok with benadryl   . Levofloxacin Itching    Ok with benadryl  . Moxifloxacin Itching    Reaction to Avelox - ok with benadryl  . Sulfamethoxazole-Trimethoprim Itching    Ok with benadryl   No current facility-administered medications on file prior to encounter.    Current Outpatient  Medications on File Prior to Encounter  Medication Sig Dispense Refill  . albuterol (PROVENTIL) (2.5 MG/3ML) 0.083% nebulizer solution Take 2.5 mg every 4 (four) hours as needed by nebulization for wheezing or shortness of breath.     Marland Kitchen. azithromycin (ZITHROMAX) 250 MG tablet Take 250 mg daily by mouth. Continuous course    . budesonide-formoterol (SYMBICORT) 160-4.5 MCG/ACT inhaler Inhale 2 puffs 2 (two) times daily into the lungs.    . cetirizine (ZYRTEC) 10 MG tablet Take 10 mg daily by mouth.    . fluticasone (FLONASE) 50 MCG/ACT nasal spray Place 1 spray daily into both nostrils.     . furosemide (LASIX) 40 MG tablet Take 40 mg daily as needed by mouth for fluid or edema (ankle swelling).    . Guaifenesin (MUCINEX MAXIMUM STRENGTH) 1200 MG TB12 Take 1,200 mg daily as needed by mouth (to thin phlegm/cough).    Marland Kitchen. HYDROcodone-acetaminophen (NORCO) 10-325 MG tablet Take 1 tablet 3 (three) times daily as needed by mouth (pain).    Marland Kitchen. losartan (COZAAR) 100 MG tablet Take 100 mg daily by mouth.     . montelukast (  SINGULAIR) 10 MG tablet Take 10 mg at bedtime by mouth.    . Morphine-Naltrexone (EMBEDA) 30-1.2 MG CPCR Take 1 capsule 2 (two) times daily by mouth. Embeda    . naproxen sodium (ALEVE) 220 MG tablet Take 440 mg 2 (two) times daily as needed by mouth (back & leg pain/headache).    . nystatin-triamcinolone ointment (MYCOLOG) Apply 1 application 2 (two) times daily as needed topically (rash).   0  . omeprazole (PRILOSEC) 40 MG capsule Take 40 mg daily by mouth.     . OXYGEN Inhale 3-4 L continuous into the lungs.    . predniSONE (DELTASONE) 10 MG tablet Take 10 mg daily by mouth.    . Tiotropium Bromide Monohydrate (SPIRIVA RESPIMAT) 2.5 MCG/ACT AERS Inhale 1 puff daily into the lungs.    . Aclidinium Bromide (TUDORZA PRESSAIR) 400 MCG/ACT AEPB Inhale 1 puff into the lungs 2 (two) times daily. (Patient not taking: Reported on 07/20/2017) 3 each 3  . Morphine Sulfate ER (MORPHABOND ER) 30 MG  T12A Take 30 mg 2 (two) times daily by mouth.      Objective: BP 120/67   Pulse 93   Temp 98.6 F (37 C) (Oral)   Resp 19   Ht 5\' 3"  (1.6 m)   Wt 210 lb (95.3 kg)   SpO2 95%   BMI 37.20 kg/m  Exam: General: 51 year old obese female sitting up in bed with nasal cannula appearing uncomfortable but in no acute distress Eyes: Extraocular muscles intact, pupils equal round reactive to light bilaterally ENTM: Moist mucous membranes, oropharynx clear Neck: Supple, no lymphadenopathy Cardiovascular: Regular rate and rhythm with no apparent murmurs, distant heart sounds Respiratory: Able to speak in full sentences, normal work of breathing, poor air movement throughout with some scattered end expiratory wheezes throughout Gastrointestinal: Obese abdomen, soft, nontender nondistended no organomegaly appreciated MSK: No gross deformities or edema.  Left arm is flexed and adducted in sling and there is generalized tenderness over the left shoulder, humerus and elbow.  Derm: Warm and dry, no new rashes Neuro: Alert and oriented x3, grip strength 5 out of 5 bilaterally, radial pulse 2+ bilaterally, sensation intact throughout. Psych: Normal mood and affect  Labs and Imaging: CBC BMET  Recent Labs  Lab 07/20/17 1547  WBC 21.7*  HGB 15.1*  HCT 45.8  PLT 231   Recent Labs  Lab 07/20/17 1547  NA 132*  K 4.5  CL 95*  CO2 28  BUN <5*  CREATININE 0.65  GLUCOSE 107*  CALCIUM 8.8*     Dg Chest 1 View  Result Date: 07/20/2017 CLINICAL DATA:  Larey Seat this morning and injured left arm. EXAM: CHEST 1 VIEW COMPARISON:  Chest x-ray 03/31/2012 FINDINGS: The cardiac silhouette, mediastinal and hilar contours are within normal limits and stable. Mild tortuosity of the thoracic aorta. No acute pulmonary findings. No pleural effusion or pneumothorax. No obvious rib fractures. Proximal left humeral shaft fracture is noted. IMPRESSION: No acute cardiopulmonary findings. Electronically Signed   By: Rudie Meyer M.D.   On: 07/20/2017 17:13   Dg Forearm Left  Result Date: 07/20/2017 CLINICAL DATA:  Larey Seat today and injured left arm. EXAM: LEFT FOREARM - 2 VIEW COMPARISON:  None. FINDINGS: The wrist and elbow joints are maintained.  No forearm fractures. IMPRESSION: No acute forearm fracture. Electronically Signed   By: Rudie Meyer M.D.   On: 07/20/2017 17:07   Dg Shoulder Left  Result Date: 07/20/2017 CLINICAL DATA:  Larey Seat this morning and injured left  arm. EXAM: LEFT SHOULDER - 2+ VIEW COMPARISON:  None. FINDINGS: Spiral type fracture involving the upper humeral shaft. The humeral head and neck are intact. [facility] is intact and the glenohumeral joint is intact IMPRESSION: No shoulder fracture or dislocation. Displaced spiral type fracture of the proximal humeral shaft. Electronically Signed   By: Rudie MeyerP.  Gallerani M.D.   On: 07/20/2017 17:06   Dg Humerus Left  Result Date: 07/20/2017 CLINICAL DATA:  Larey SeatFell today and injured left arm. EXAM: LEFT HUMERUS - 2+ VIEW COMPARISON:  None. FINDINGS: There is a displaced spiral type fracture of the proximal humeral shaft with anterior displacement. No involvement of the shoulder or elbow joints. IMPRESSION: Displaced proximal humeral shaft fracture. Electronically Signed   By: Rudie MeyerP.  Gallerani M.D.   On: 07/20/2017 17:07   Dg Hip Unilat With Pelvis 2-3 Views Left  Result Date: 07/20/2017 CLINICAL DATA:  Larey SeatFell today and injured left hip. EXAM: DG HIP (WITH OR WITHOUT PELVIS) 2-3V LEFT COMPARISON:  None. FINDINGS: Both hips are normally located. Moderate degenerative changes bilaterally, left greater than right. No definite hip fracture. The pubic symphysis and SI joints are intact. No definite pelvic fractures. IMPRESSION: Left hip joint degenerative changes but no acute hip or pelvic fracture. Electronically Signed   By: Rudie MeyerP.  Gallerani M.D.   On: 07/20/2017 17:08    Renne MuscaWarden, Audrielle Vankuren L, MD 07/20/2017, 8:59 PM PGY-2, [facility] FPTS Intern  pager: [phone], text pages welcome

## 2017-07-20 NOTE — Consult Note (Signed)
Ortho Consult  Subjective: Patient complains of left arm pain after a fall today at home. ? Syncopal episode.  Does not remember why she fell.  No other complaints.    Objective: Vital signs in last 24 hours: Temp:  [98.6 F (37 C)] 98.6 F (37 C) (11/11 1519) Pulse Rate:  [80-102] 93 (11/11 2000) Resp:  [13-22] 19 (11/11 2000) BP: (114-144)/(67-85) 120/67 (11/11 2000) SpO2:  [94 %-99 %] 95 % (11/11 2000) Weight:  [95.3 kg (210 lb)] 95.3 kg (210 lb) (11/11 1516)  Intake/Output from previous day: No intake/output data recorded. Intake/Output this shift: No intake/output data recorded.  Recent Labs    07/20/17 1547  HGB 15.1*   Recent Labs    07/20/17 1547  WBC 21.7*  RBC 5.07  HCT 45.8  PLT 231   Recent Labs    07/20/17 1547  NA 132*  K 4.5  CL 95*  CO2 28  BUN <5*  CREATININE 0.65  GLUCOSE 107*  CALCIUM 8.8*   No results for input(s): LABPT, INR in the last 72 hours.  Exam:  HEENT : NCAT no bruising and no tenderness, neck non tender, left shoulder not tender, arm is tender, skin intact, NVI distally including her Axillary and Radial Nerves Right UE and bilateral LEs with normal AROM and no pain  XRAYS: AP and LAT of the shoulder and humerus show a displaced, spiral proximal 1/3 humerus fracture.   Assessment/Plan: Displaced proximal 1/3 humeral shaft fracture.  Closed and NVI.   Discussed options with the patient to include coaptation splinting and possible fracture bracing vs surgical management.  Based upon the proximal location of the shaft fracture I do not believe we will get any real control of the fracture with either splint - the sling may do just as well.  A closed IM nail will stabilize and align the fractured humerus which would allow for improved mobility and with the home O2 would be safer from a mobility and fall risk standpoint to have that hand for light ADLs and balance.  The patient has pulmonary issues which have precluded outpatient  elective back surgery.  Will need medical/pulmonary clearance prior to any surgery.  The patient understands our concerns. We will follow along with medicine.  Would recommend NPO after breakfast in case cleared for surgery tomorrow. Thank you for the consult   Ajwa Kimberley,STEVEN R 07/20/2017, 8:59 PM  [phone] cell

## 2017-07-20 NOTE — ED Provider Notes (Signed)
[name] EMERGENCY DEPARTMENT Provider Note   CSN: [id number] Arrival date & time: 07/20/17  1510  History   Chief Complaint Chief Complaint  Patient presents with  . Fall  . Shoulder Injury   HPI [name] is a 51 y.o. female with PMH of COPD and HTN who presents after a fall from standing at home. The incident happened about 4 hours ago. The patient states she walked out of the bathroom and became lightheaded and fell to the ground landing on her left hip and arm. She did not hit her head or lose consciousness. She is not currently on any blood thinners. She states family help her to her feet and she has been able to walk around since the accident but the pain in her left shoulder and arm are the reason she came into the hospital. She did not have any associated chest pain or shortness of breath.   HPI  Past Medical History:  Diagnosis Date  . Allergic rhinitis   . Asthma   . COPD (chronic obstructive pulmonary disease) (HCC)   . GERD (gastroesophageal reflux disease)   . Hypertension   . Shortness of breath     Patient Active Problem List   Diagnosis Date Noted  . Lumbar spinal stenosis 04/03/2012    Class: Diagnosis of  . COPD (chronic obstructive pulmonary disease) (HCC) 05/31/2010  . ALLERGIC RHINITIS 05/09/2010  . DYSPNEA ON EXERTION 05/09/2010   Past Surgical History:  Procedure Laterality Date  . NECK SURGERY  Sept 2005  . TOTAL ABDOMINAL HYSTERECTOMY  Oct 2003  . TUBAL LIGATION  1995   OB History    No data available     Home Medications    Prior to Admission medications   Medication Sig Start Date End Date Taking? Authorizing Provider  Aclidinium Bromide (TUDORZA PRESSAIR) 400 MCG/ACT AEPB Inhale 1 puff into the lungs 2 (two) times daily. 04/29/12   Barbaraann Share, MD  albuterol (PROAIR HFA) 108 (90 BASE) MCG/ACT inhaler Inhale 2 puffs into the lungs every 6 (six) hours as needed. 03/09/12 03/09/13  Barbaraann Share, MD  albuterol  (PROVENTIL) (2.5 MG/3ML) 0.083% nebulizer solution Take 2.5 mg by nebulization every 4 (four) hours as needed. For wheezing    [provider]  ergocalciferol (VITAMIN D2) 50000 UNITS capsule Take 50,000 Units by mouth once a week. Wednesdays    [provider]  fluticasone (FLONASE) 50 MCG/ACT nasal spray Place 2 sprays into the nose daily.     [provider]  gabapentin (NEURONTIN) 300 MG capsule Take 1 capsule (300 mg total) by mouth at bedtime. 04/08/12 04/08/13  Maud Deed, PA-C  HYDROcodone-acetaminophen (NORCO) 5-325 MG per tablet Take 1 tablet by mouth every 4 (four) hours as needed. For pain    [provider]  losartan (COZAAR) 50 MG tablet Take 50 mg by mouth daily.  12/04/11   [provider]  omeprazole (PRILOSEC) 20 MG capsule Take 20 mg by mouth daily.     [provider]   Family History Family History  Problem Relation Age of Onset  . Emphysema Father   . Rheum arthritis Father   . Lung cancer Father   . Emphysema Mother    Social History Social History   Tobacco Use  . Smoking status: Current Every Day Smoker    Packs/day: 1.00    Years: 30.00    Pack years: 30.00    Types: Cigarettes  . Smokeless tobacco:  Never Used  . Tobacco comment: currently smoking 1/2 ppd.   Substance Use Topics  . Alcohol use: Yes    Comment: occassionally  . Drug use: No   Allergies   Erythromycin and Moxifloxacin  Review of Systems Review of Systems  Constitutional: Negative for chills and fever.  HENT: Negative for ear pain and sore throat.   Eyes: Negative for pain and visual disturbance.  Respiratory: Negative for cough and shortness of breath.   Cardiovascular: Negative for chest pain and palpitations.  Gastrointestinal: Negative for abdominal pain and vomiting.  Genitourinary: Negative for dysuria and hematuria.  Musculoskeletal: Positive for arthralgias. Negative for back pain.  Skin: Negative for color change and  rash.  Neurological: Positive for light-headedness. Negative for seizures and syncope.  All other systems reviewed and are negative.  Physical Exam Updated Vital Signs Temp 98.6 F (37 C) (Oral)   Ht 5\' 3"  (1.6 m)   Wt 95.3 kg (210 lb)   SpO2 95%   BMI 37.20 kg/m   Physical Exam  Constitutional: She appears well-developed and well-nourished. No distress.  HENT:  Head: Normocephalic and atraumatic.  Eyes: Conjunctivae are normal.  Neck: Neck supple.  Cardiovascular: Normal rate and regular rhythm.  No murmur heard. Pulmonary/Chest: Effort normal and breath sounds normal. No respiratory distress.  Abdominal: Soft. There is no tenderness.  Musculoskeletal: She exhibits tenderness (over the left shoulder, humerus, and elbow). She exhibits no edema.  Neurological: She is alert.  Skin: Skin is warm and dry.  Psychiatric: She has a normal mood and affect.  Nursing note and vitals reviewed.  ED Treatments / Results  Labs (all labs ordered are listed, but only abnormal results are displayed) Labs Reviewed  CBC WITH DIFFERENTIAL/PLATELET  COMPREHENSIVE METABOLIC PANEL  I-STAT TROPONIN, ED   EKG  EKG Interpretation None      Radiology No results found.  Procedures Procedures (including critical care time)  Medications Ordered in ED Medications  ondansetron (ZOFRAN) injection 4 mg (not administered)  fentaNYL (SUBLIMAZE) injection 100 mcg (not administered)   Initial Impression / Assessment and Plan / ED Course  I have reviewed the triage vital signs and the nursing notes.  Pertinent labs & imaging results that were available during my care of the patient were reviewed by me and considered in my medical decision making (see chart for details).  [name] is a 51 y.o. female with PMH of HTN and COPD who presented with pain in her left shoulder and arm after a near syncopal episode at home earlier today.   Labs and imaging ordered  Abnormal results  above Pertinent imaging result revealed: left humerus fracutre  Likely vasovagal syncope.   Patient has no concerning significant signs or symptoms of syncope including exertional chest pain or exertional syncope. No family history of sudden cardiac death or unexplained death.   Patient had no chest pain, no concerning EKG findings. Specifically no signs of Brugada, HOCM, pre-excitation, or prolonged QT.  Patient denies tearing chest pain or back pain, therefore I doubt Aortic dissection.   No postictal period, loss of incontinence, tongue biting  or witnessed tonic clonic movements. Doubt Seizure.   Patient denies headache prior to or currently. No focal deficits found on neuro exam. Doubt SAH or ICH.   Patient denies any rectal bleeding, melena, no pallor on exam. No diplopia , dysarthria, ataxia, or vertigo prior to or after the event, doubt posterior circulation stroke.  Patient denies any alcohol or illicit drug use.  Consulted orthopedics who will saw and evaluated the patient. Will take the patient to the OR later this evening. Discussed patient's care with family medicine who will admit the patient.   Final Clinical Impressions(s) / ED Diagnoses   Final diagnoses:  Closed fracture of proximal end of left humerus, unspecified fracture morphology, initial encounter  Fall, initial encounter  Acute pain of left shoulder  Near syncope    ED Discharge Orders    None       Lamont SnowballGarza, Mirta Mally, MD 07/21/17 1455    Linwood DibblesKnapp, Jon, MD 07/21/17 Serena [id number]

## 2017-07-20 NOTE — ED Triage Notes (Signed)
Pt arrived via [facility] from home c/o near syncope with fall with left should and arm pain.  Pt arrived with sling to left arm EMS gave Fentanyl.

## 2017-07-21 ENCOUNTER — Encounter (HOSPITAL_COMMUNITY): Payer: Self-pay | Admitting: Physician Assistant

## 2017-07-21 ENCOUNTER — Inpatient Hospital Stay (HOSPITAL_COMMUNITY): Payer: BLUE CROSS/BLUE SHIELD

## 2017-07-21 ENCOUNTER — Telehealth: Payer: Self-pay | Admitting: Cardiology

## 2017-07-21 ENCOUNTER — Other Ambulatory Visit: Payer: Self-pay | Admitting: Physician Assistant

## 2017-07-21 DIAGNOSIS — W19XXXA Unspecified fall, initial encounter: Secondary | ICD-10-CM

## 2017-07-21 DIAGNOSIS — R55 Syncope and collapse: Secondary | ICD-10-CM

## 2017-07-21 DIAGNOSIS — J441 Chronic obstructive pulmonary disease with (acute) exacerbation: Secondary | ICD-10-CM

## 2017-07-21 DIAGNOSIS — M25512 Pain in left shoulder: Secondary | ICD-10-CM

## 2017-07-21 LAB — COMPREHENSIVE METABOLIC PANEL
ALT: 21 U/L (ref 14–54)
AST: 21 U/L (ref 15–41)
Albumin: 3.3 g/dL — ABNORMAL LOW (ref 3.5–5.0)
Alkaline Phosphatase: 71 U/L (ref 38–126)
Anion gap: 7 (ref 5–15)
BILIRUBIN TOTAL: 0.6 mg/dL (ref 0.3–1.2)
CO2: 35 mmol/L — ABNORMAL HIGH (ref 22–32)
CREATININE: 0.77 mg/dL (ref 0.44–1.00)
Calcium: 8.6 mg/dL — ABNORMAL LOW (ref 8.9–10.3)
Chloride: 98 mmol/L — ABNORMAL LOW (ref 101–111)
Glucose, Bld: 80 mg/dL (ref 65–99)
POTASSIUM: 4.5 mmol/L (ref 3.5–5.1)
Sodium: 140 mmol/L (ref 135–145)
TOTAL PROTEIN: 5.7 g/dL — AB (ref 6.5–8.1)

## 2017-07-21 LAB — CBC
HCT: 46.4 % — ABNORMAL HIGH (ref 36.0–46.0)
HEMOGLOBIN: 14.7 g/dL (ref 12.0–15.0)
MCH: 29.6 pg (ref 26.0–34.0)
MCHC: 31.7 g/dL (ref 30.0–36.0)
MCV: 93.5 fL (ref 78.0–100.0)
PLATELETS: 250 10*3/uL (ref 150–400)
RBC: 4.96 MIL/uL (ref 3.87–5.11)
RDW: 14.3 % (ref 11.5–15.5)
WBC: 12.3 10*3/uL — AB (ref 4.0–10.5)

## 2017-07-21 LAB — TROPONIN I: Troponin I: <0.03 ng/mL (ref <0.03)

## 2017-07-21 LAB — ECHOCARDIOGRAM COMPLETE
HEIGHTINCHES: 63 in
WEIGHTICAEL: 3360 [oz_av]

## 2017-07-21 LAB — HIV ANTIBODY (ROUTINE TESTING W REFLEX): HIV Screen 4th Generation wRfx: NONREACTIVE

## 2017-07-21 MED ORDER — SODIUM CHLORIDE 0.9 % IV SOLN: INTRAVENOUS | Status: DC

## 2017-07-21 MED ORDER — POLYETHYLENE GLYCOL 3350 17 G PO PACK
17.0000 g | PACK | Freq: Every day | ORAL | Status: DC
  Administered 2017-07-23: 17 g via ORAL
  Filled 2017-07-21 (×4): qty 1

## 2017-07-21 MED ORDER — HYDROMORPHONE HCL 1 MG/ML IJ SOLN
1.0000 mg | INTRAMUSCULAR | Status: DC | PRN
  Administered 2017-07-21 – 2017-07-25 (×30): 1 mg via INTRAVENOUS
  Filled 2017-07-21 (×32): qty 1

## 2017-07-21 MED ORDER — NICOTINE 21 MG/24HR TD PT24
21.0000 mg | MEDICATED_PATCH | TRANSDERMAL | Status: DC
  Administered 2017-07-21 – 2017-07-24 (×4): 21 mg via TRANSDERMAL
  Filled 2017-07-21 (×4): qty 1

## 2017-07-21 MED ORDER — SENNA 8.6 MG PO TABS
2.0000 | ORAL_TABLET | Freq: Every day | ORAL | Status: DC
  Administered 2017-07-22 – 2017-07-25 (×4): 17.2 mg via ORAL
  Filled 2017-07-21 (×4): qty 2

## 2017-07-21 MED ORDER — HYDROCODONE-ACETAMINOPHEN 10-325 MG PO TABS
1.0000 | ORAL_TABLET | Freq: Three times a day (TID) | ORAL | Status: DC
  Administered 2017-07-21 – 2017-07-22 (×5): 1 via ORAL
  Filled 2017-07-21 (×5): qty 1

## 2017-07-21 MED ORDER — IPRATROPIUM-ALBUTEROL 0.5-2.5 (3) MG/3ML IN SOLN
3.0000 mL | Freq: Three times a day (TID) | RESPIRATORY_TRACT | Status: DC
  Administered 2017-07-21: 3 mL via RESPIRATORY_TRACT
  Filled 2017-07-21: qty 3

## 2017-07-21 MED ORDER — IPRATROPIUM-ALBUTEROL 0.5-2.5 (3) MG/3ML IN SOLN
3.0000 mL | Freq: Four times a day (QID) | RESPIRATORY_TRACT | Status: DC
  Administered 2017-07-21 – 2017-07-22 (×5): 3 mL via RESPIRATORY_TRACT
  Filled 2017-07-21 (×5): qty 3

## 2017-07-21 NOTE — Progress Notes (Signed)
   Subjective: Left humerus fracture Still c/o moderate to severe pain Just underwent echo to work with clearing pt for surgery Continues on O2 and a productive cough Patient reports pain as severe.  Objective:   VITALS:   Vitals:   07/21/17 0241 07/21/17 0650  BP:  120/68  Pulse:  100  Resp:  18  Temp:  99.7 F (37.6 C)  SpO2: 99% 94%    Left shoulder/arm currently in a sling Not taken thru any rom Mild edema to left shoulder Nv intact distally  LABS Recent Labs    07/20/17 1547 07/21/17 0609  HGB 15.1* 14.7  HCT 45.8 46.4*  WBC 21.7* 12.3*  PLT 231 250    Recent Labs    07/20/17 1547 07/21/17 0609  NA 132* 140  K 4.5 4.5  BUN <5* <5*  CREATININE 0.65 0.77  GLUCOSE 107* 80     Assessment/Plan: Left humerus fracture awaiting surgery Keep NPO in case she is cleared from pulmonary standpoint for surgery Will try dilaudid for pain relief for now Will continue to monitor her progress    Alphonsa OverallBrad Muhsin Doris, MPAS, PA-C  07/21/2017, 10:48 AM

## 2017-07-21 NOTE — Progress Notes (Signed)
  Echocardiogram 2D Echocardiogram has been performed.  Ramisa Duman T Mahesh Sizemore 07/21/2017, 10:51 AM

## 2017-07-21 NOTE — Progress Notes (Signed)
Family Medicine Teaching Service Daily Progress Note Intern Pager: [phone]  Patient name: Nichole RanchDrinda S Soth Medical record number: [id number] Date of birth: 05-30-1966 Age: 51 y.o. Gender: female  Primary Care Provider: [name], Michael C, MD Consultants: Orthopedics surgery Code Status: Full  Pt Overview and Major Events to Date:  [name] is a 51 y.o. female presenting after a ground-level fall resulting in left displaced spiral proximal humeral fracture. PMH is significant for severe COPD on 3-4 L at home, azithromycin and prednisone, morbid obesity, hypertension, chronic pain  Assessment and Plan: left displaced spiral proximal humeral fracture Occurred after fall  -Continue Norco 10 325 mg 3 times daily as needed; morphine 2 mg every 4 hours as needed severe pain -Orthopedics following; plan to operate today pending medical clearance -Pulmonary consult appreciated for medical clearance -Vitals per floor protocol -Follow-up echo -orthostatic vital signs  Ground-level fall resulting  Fall occurred when patient was off of home O2 while using the restroom. Likely due to oxygen desaturation from severe COPD. Not likely neurologic etiology or siezure given no LOC, postictal state, AxO3, no neurologic deficits. No obvious signs of cardiac abnormality (no CP, negative troponins x2, no change in EKG). However, given significant smoking history, warrants full evaluation by cardiac eval. Echo pending. Orthostatics borderline positive orthostatic w/ 19 SBP change from 103 to 122. Orthostatic change  - appreciate cardiology consult for work up of syncopy  Chronic pain TakesNorco 10 325 mg 3 times, PO morphine-naltrexone (30-1.2) BID, Morphine Sulfate ER (30 mg) 2 pills BID.  - unclear home regimen. Per med-rec currently transitioning to morphine sulphate from morphine-naltrexone for chronic pain. Will likely need more pain management given recent fracture - continue norco 10-325 mg TID  scheduled - morphine 2 mg q4h prn - observe morphine usage over the next 24 hrs,  - consider transition to PO morphine sulphate based on morphine usage   COPD Pt reports breathing is at baseline. Uses 3-4 L of oxygen at home.  Home medications include Symbicort, albuterol, azithromycin daily, Zyrtec, Flonase, Mucinex, Singulair, prednisone 10 mg.  Reports that she takes nebulizers 3-4 times daily at baseline.  No report of increased sputum production fevers or increased shortness of breath.  -Continue 3-4 L oxygen for O2 greater than 90% -Pulmonary consult for medical clearance for surgery -Duonebs q6hrs scheduled, albuterol every 4 hours as needed, Dulera 2 puffs 2 times daily -azithromycin 250mg  daily -prednisone 10mg  daily - pulmonology consult appreciated for surgical clearance  HTN Normotensive upon presentation.  -continue losartan 100mg  daily  Chronic pain Patient reports chronic leg and back pain. Takes norco 10-325mg  TID PRN daily.  - continue norco - morphine 2mg  q4hrs PRN severe pain - bowel regimen w/ miralax and senna  GERD Continue protonix 40mg  daily  FEN/GI:  NPO, famotidine Prophylaxis: SCDs  Disposition: admit to med-surg; ortho following  Subjective:  Pt pain is 10/10. Reports her breathing is back to baseline. No other complaints.   Objective: Temp:  [98.6 F (37 C)-99.7 F (37.6 C)] 99.7 F (37.6 C) (11/12 0650) Pulse Rate:  [80-102] 100 (11/12 0650) Resp:  [13-22] 18 (11/12 0650) BP: (114-144)/(63-87) 120/68 (11/12 0650) SpO2:  [94 %-99 %] 94 % (11/12 0650) Weight:  [210 lb (95.3 kg)] 210 lb (95.3 kg) (11/11 1516) Physical Exam: General: NAD, resting in bed, obese Cardiovascular: rrr, no mrg Respiratory: satting well on 4L, no increased work of breathing, decreased air movement throughout, expiratory wheezing Abdomen: soft, nontender, nondistended Extremities: left arm in sling,  no lower extremity edema  Laboratory: Recent Labs  Lab  07/20/17 1547 07/21/17 0609  WBC 21.7* 12.3*  HGB 15.1* 14.7  HCT 45.8 46.4*  PLT 231 250   Recent Labs  Lab 07/20/17 1547 07/21/17 0609  NA 132* 140  K 4.5 4.5  CL 95* 98*  CO2 28 35*  BUN <5* <5*  CREATININE 0.65 0.77  CALCIUM 8.8* 8.6*  PROT 6.3* 5.7*  BILITOT 0.6 0.6  ALKPHOS 75 71  ALT 23 21  AST 27 21  GLUCOSE 107* 80      Imaging/Diagnostic Tests: Dg Chest 1 View  Result Date: 07/20/2017 CLINICAL DATA:  Larey SeatFell this morning and injured left arm. EXAM: CHEST 1 VIEW COMPARISON:  Chest x-ray 03/31/2012 FINDINGS: The cardiac silhouette, mediastinal and hilar contours are within normal limits and stable. Mild tortuosity of the thoracic aorta. No acute pulmonary findings. No pleural effusion or pneumothorax. No obvious rib fractures. Proximal left humeral shaft fracture is noted. IMPRESSION: No acute cardiopulmonary findings. Electronically Signed   By: Rudie MeyerP.  Gallerani M.D.   On: 07/20/2017 17:13   Dg Forearm Left  Result Date: 07/20/2017 CLINICAL DATA:  Larey SeatFell today and injured left arm. EXAM: LEFT FOREARM - 2 VIEW COMPARISON:  None. FINDINGS: The wrist and elbow joints are maintained.  No forearm fractures. IMPRESSION: No acute forearm fracture. Electronically Signed   By: Rudie MeyerP.  Gallerani M.D.   On: 07/20/2017 17:07   Dg Shoulder Left  Result Date: 07/20/2017 CLINICAL DATA:  Larey SeatFell this morning and injured left arm. EXAM: LEFT SHOULDER - 2+ VIEW COMPARISON:  None. FINDINGS: Spiral type fracture involving the upper humeral shaft. The humeral head and neck are intact. The [facility] joint is intact and the glenohumeral joint is intact IMPRESSION: No shoulder fracture or dislocation. Displaced spiral type fracture of the proximal humeral shaft. Electronically Signed   By: Rudie MeyerP.  Gallerani M.D.   On: 07/20/2017 17:06   Dg Humerus Left  Result Date: 07/20/2017 CLINICAL DATA:  Larey SeatFell today and injured left arm. EXAM: LEFT HUMERUS - 2+ VIEW COMPARISON:  None. FINDINGS: There is a displaced spiral  type fracture of the proximal humeral shaft with anterior displacement. No involvement of the shoulder or elbow joints. IMPRESSION: Displaced proximal humeral shaft fracture. Electronically Signed   By: Rudie MeyerP.  Gallerani M.D.   On: 07/20/2017 17:07   Dg Hip Unilat With Pelvis 2-3 Views Left  Result Date: 07/20/2017 CLINICAL DATA:  Larey SeatFell today and injured left hip. EXAM: DG HIP (WITH OR WITHOUT PELVIS) 2-3V LEFT COMPARISON:  None. FINDINGS: Both hips are normally located. Moderate degenerative changes bilaterally, left greater than right. No definite hip fracture. The pubic symphysis and SI joints are intact. No definite pelvic fractures. IMPRESSION: Left hip joint degenerative changes but no acute hip or pelvic fracture. Electronically Signed   By: Rudie MeyerP.  Gallerani M.D.   On: 07/20/2017 17:08    Garnette Gunnerhompson, Aaron B, MD 07/21/2017, 9:39 AM PGY-1, [facility] FPTS Intern pager: [phone], text pages welcome

## 2017-07-21 NOTE — Consult Note (Signed)
Cardiology Consultation:   Patient ID: Nichole RanchDrinda S Morrison; [id number]; 10/01/1965   Admit date: 07/20/2017 Date of Consult: 07/21/2017  Primary Care Provider: Eartha Morrison, Nichole C, MD Primary Cardiologist: new Primary Electrophysiologist:  n/a   Patient Profile:   [name] is a 51 y.o. female with a hx of tob use, COPD on home O2 and prednisone, GERD, HTN, OSA not on CPAP, chronic pain, who is being seen today for the evaluation of syncope at the request of Dr Jennette KettleNeal.  History of Present Illness:   [name] fell and broke her L arm. She needs surgery. Pulm/CCM has seen her and recommended keeping O2 sats 88-92%, limiting narcotics and using nebs and inhalers. She is at increased risk from a pulm standpoint, may be hard to get off the ventilator.  Cardiology was asked to see her for syncope.  [name] has had one episode of syncope in her life, back when she was pregnant.  About a week ago, she was coming home from the store and when she got out of the car, she had to stand still for several seconds waiting for her husband.  She became lightheaded and had to sit back down fairly suddenly.  She feels that she would have fallen if she had not been able to sit down.  She waited a minute or so and felt better, was able to go in the house with no further problems.  Yesterday, she had just been to the bathroom to urinate.  She admits that she is bad about waiting until her bladder is very full to go to the bathroom.  She also admits that she is bad about not eating until suppertime every day.  She was not wearing her oxygen.  She walked out of the bathroom and was heading toward the kitchen when she became lightheaded.  She remembers being lightheaded.  She does not remember tripping.  As she started to fall, she tried to catch herself with her hands, but was unable to.  She does not think she lost consciousness.  She remembers falling and she remembers hitting the ground.  The pain in her arm  was immediate. Her husband got her back to bed and then checked her blood pressure.  He does not remember a heart rate, but her systolic blood pressure was approximately 90, about 30 points lower than usual for her.  She never has palpitations.  Other than the 2 episodes which seemed orthostatic in nature, she has not had any presyncope or syncope in over 20 years.  She never gets chest pain.  She has chronic dyspnea on exertion.  If she is not doing much, she does not wear her oxygen.  If she is going up stairs or doing anything strenuous, leaving the house, she will wear her oxygen.  If she has her oxygen on, she can go from the laundry room up to the bedroom slowly, but without stopping.  She takes the stairs one at a time, at least partly because of back problems.  However, she does not have to stop until she gets up to the bedroom.  At that point, she does need to sit down and rest and catch her breath.  She is starting to have trouble lifting her grandchildren, who are a year and a half old and weigh about 25 pounds each (a set of twins).  This is mostly because of her back, but also because of her breathing.  She does not ambulate very much  at a time and does not ever carry anything or exercise because of her breathing.  However, she feels that her respiratory status was at baseline prior to admission.  She denies lower extremity edema, orthopnea, or PND.  However, she does sleep with O2 every night.  She states she has tried CPAP several times, but is never able to wear it.  She always feels like she is smothering.  She has tried multiple masks and pressures, but it always feels like she is smothering.   Past Medical History:  Diagnosis Date  . Allergic rhinitis   . Asthma   . COPD (chronic obstructive pulmonary disease) (HCC)    on home O2  . GERD (gastroesophageal reflux disease)   . Hypertension   . Shortness of breath    Past Surgical History:  Procedure Laterality Date  . NECK  SURGERY  Sept 2005  . TOTAL ABDOMINAL HYSTERECTOMY  Oct 2003  . TUBAL LIGATION  1995     Inpatient Medications: Scheduled Meds: . azithromycin  250 mg Oral Daily  . fluticasone  1 spray Each Nare Daily  . HYDROcodone-acetaminophen  1 tablet Oral TID  . ipratropium-albuterol  3 mL Nebulization Q6H  . loratadine  10 mg Oral Daily  . losartan  100 mg Oral Daily  . mometasone-formoterol  2 puff Inhalation BID  . montelukast  10 mg Oral QHS  . pantoprazole  40 mg Oral Daily  . polyethylene glycol  17 g Oral Daily  . predniSONE  10 mg Oral Daily  . senna  2 tablet Oral Daily   Continuous Infusions:  PRN Meds: albuterol, furosemide, guaiFENesin, HYDROmorphone (DILAUDID) injection, morphine injection Medication Sig  albuterol (PROVENTIL) (2.5 MG/3ML) 0.083% nebulizer solution Take 2.5 mg every 4 (four) hours as needed by nebulization for wheezing or shortness of breath.   azithromycin (ZITHROMAX) 250 MG tablet Take 250 mg daily by mouth. Continuous course  budesonide-formoterol (SYMBICORT) 160-4.5 MCG/ACT inhaler Inhale 2 puffs 2 (two) times daily into the lungs.  cetirizine (ZYRTEC) 10 MG tablet Take 10 mg daily by mouth.  fluticasone (FLONASE) 50 MCG/ACT nasal spray Place 1 spray daily into both nostrils.   furosemide (LASIX) 40 MG tablet Take 40 mg daily as needed by mouth for fluid or edema (ankle swelling).  Guaifenesin (MUCINEX MAXIMUM STRENGTH) 1200 MG TB12 Take 1,200 mg daily as needed by mouth (to thin phlegm/cough).  HYDROcodone-acetaminophen (NORCO) 10-325 MG tablet Take 1 tablet 3 (three) times daily as needed by mouth (pain).  losartan (COZAAR) 100 MG tablet Take 100 mg daily by mouth.   montelukast (SINGULAIR) 10 MG tablet Take 10 mg at bedtime by mouth.  Morphine-Naltrexone (EMBEDA) 30-1.2 MG CPCR Take 1 capsule 2 (two) times daily by mouth. Embeda  naproxen sodium (ALEVE) 220 MG tablet Take 440 mg 2 (two) times daily as needed by mouth (back & leg pain/headache).    nystatin-triamcinolone ointment (MYCOLOG) Apply 1 application 2 (two) times daily as needed topically (rash).   omeprazole (PRILOSEC) 40 MG capsule Take 40 mg daily by mouth.   OXYGEN Inhale 3-4 L continuous into the lungs.  predniSONE (DELTASONE) 10 MG tablet Take 10 mg daily by mouth.  Tiotropium Bromide Monohydrate (SPIRIVA RESPIMAT) 2.5 MCG/ACT AERS Inhale 1 puff daily into the lungs.  Aclidinium Bromide (TUDORZA PRESSAIR) 400 MCG/ACT AEPB Inhale 1 puff into the lungs 2 (two) times daily. Patient not taking: Reported on 07/20/2017  Morphine Sulfate ER (MORPHABOND ER) 30 MG T12A Take 30 mg 2 (two) times daily  by mouth.    Allergies:    Allergies  Allergen Reactions  . Amoxicillin-Pot Clavulanate Itching    Ok with benadryl  . Erythromycin Rash    Ok with benadryl   . Levofloxacin Itching    Ok with benadryl  . Moxifloxacin Itching    Reaction to Avelox - ok with benadryl  . Sulfamethoxazole-Trimethoprim Itching    Ok with benadryl    Social History:   Social History   Socioeconomic History  . Marital status: Married    Spouse name: Not on file  . Number of children: Y  . Years of education: Not on file  . Highest education level: Not on file  Social Needs  . Financial resource strain: Not on file  . Food insecurity - worry: Not on file  . Food insecurity - inability: Not on file  . Transportation needs - medical: Not on file  . Transportation needs - non-medical: Not on file  Occupational History  . Occupation: house wife  Tobacco Use  . Smoking status: Current Every Day Smoker    Packs/day: 1.00    Years: 30.00    Pack years: 30.00    Types: Cigarettes  . Smokeless tobacco: Never Used  . Tobacco comment: currently smoking 1/2 ppd.   Substance and Sexual Activity  . Alcohol use: Yes    Comment: occassionally  . Drug use: No  . Sexual activity: Not on file  Other Topics Concern  . Not on file  Social History Narrative   Pt lives in [place], with her  husband.    Family History:   The patient's family history includes Emphysema in her father and mother; Lung cancer in her father; Rheum arthritis in her father. Pt indicated that her mother is deceased. She indicated that her father is deceased.   ROS:  Please see the history of present illness.  All other ROS reviewed and negative.      Physical Exam/Data:   Vitals:   07/20/17 2223 07/21/17 0241 07/21/17 0650 07/21/17 1339  BP: 124/63  120/68   Pulse: 98  100   Resp: 20  18   Temp: 99.3 F (37.4 Morrison)  99.7 F (37.6 Morrison)   TempSrc: Oral  Oral   SpO2: 98% 99% 94% 94%  Weight:      Height:        Intake/Output Summary (Last 24 hours) at 07/21/2017 1423 Last data filed at 07/21/2017 0900 Gross per 24 hour  Intake 0 ml  Output -  Net 0 ml   Filed Weights   07/20/17 1516  Weight: 210 lb (95.3 kg)   Body mass index is 37.2 kg/m.  General:  Well nourished, well developed, in no acute distress on O2 HEENT: normal Lymph: no adenopathy Neck: no JVD seen, difficult to assess secondary to body habitus Endocrine:  No thryomegaly Vascular: No carotid bruits; 4/4 extremity pulses 2+   Cardiac:  normal S1, S2; RRR; no murmur  Lungs:  Diffuse wheezing bilaterally, no rhonchi, few rales  Abd: soft, nontender, no hepatomegaly  Ext: no edema Musculoskeletal:  No deformities, BUE and BLE strength normal and equal Skin: warm and dry, areas of erythema noted. Neuro:  CNs 2-12 intact, no focal abnormalities noted Psych:  Normal affect   EKG:  The EKG was personally reviewed and demonstrates: SR, no acute ischemic changes Telemetry:  Telemetry was personally reviewed and demonstrates:  n/a  Relevant CV Studies:  ECHO: 07/21/2017 - Left ventricle: The cavity size was  normal. Systolic function was   normal. The estimated ejection fraction was in the range of 60%   to 65%. Wall motion was normal; there were no regional wall   motion abnormalities. Left ventricular diastolic  function   parameters were normal. - Aortic valve: Poorly visualized. Trileaflet; normal thickness,   mildly calcified leaflets. There was mild stenosis. Mean gradient   (S): 18 mm Hg. - Pulmonary arteries: Systolic pressure could not be accurately   estimated. - Pericardium, extracardiac: A prominent pericardial fat pad was   present.  Laboratory Data:  Chemistry Recent Labs  Lab 07/20/17 1547 07/21/17 0609  NA 132* 140  K 4.5 4.5  CL 95* 98*  CO2 28 35*  GLUCOSE 107* 80  BUN <5* <5*  CREATININE 0.65 0.77  CALCIUM 8.8* 8.6*  GFRNONAA >60 >60  GFRAA >60 >60  ANIONGAP 9 7    Recent Labs  Lab 07/20/17 1547 07/21/17 0609  PROT 6.3* 5.7*  ALBUMIN 3.9 3.3*  AST 27 21  ALT 23 21  ALKPHOS 75 71  BILITOT 0.6 0.6   Hematology Recent Labs  Lab 07/20/17 1547 07/21/17 0609  WBC 21.7* 12.3*  RBC 5.07 4.96  HGB 15.1* 14.7  HCT 45.8 46.4*  MCV 90.3 93.5  MCH 29.8 29.6  MCHC 33.0 31.7  RDW 14.0 14.3  PLT 231 250   Cardiac Enzymes Recent Labs  Lab 07/21/17 0609 07/21/17 1108  TROPONINI <0.03 <0.03    Recent Labs  Lab 07/20/17 1605  TROPIPOC 0.01    BNP Recent Labs  Lab 07/20/17 2229  BNP 27.1     Radiology/Studies:  Dg Chest 1 View  Result Date: 07/20/2017 CLINICAL DATA:  Larey Seat this morning and injured left arm. EXAM: CHEST 1 VIEW COMPARISON:  Chest x-ray 03/31/2012 FINDINGS: The cardiac silhouette, mediastinal and hilar contours are within normal limits and stable. Mild tortuosity of the thoracic aorta. No acute pulmonary findings. No pleural effusion or pneumothorax. No obvious rib fractures. Proximal left humeral shaft fracture is noted. IMPRESSION: No acute cardiopulmonary findings. Electronically Signed   By: Rudie Meyer M.D.   On: 07/20/2017 17:13   Dg Forearm Left  Result Date: 07/20/2017 CLINICAL DATA:  Larey Seat today and injured left arm. EXAM: LEFT FOREARM - 2 VIEW COMPARISON:  None. FINDINGS: The wrist and elbow joints are maintained.  No  forearm fractures. IMPRESSION: No acute forearm fracture. Electronically Signed   By: Rudie Meyer M.D.   On: 07/20/2017 17:07   Dg Shoulder Left  Result Date: 07/20/2017 CLINICAL DATA:  Larey Seat this morning and injured left arm. EXAM: LEFT SHOULDER - 2+ VIEW COMPARISON:  None. FINDINGS: Spiral type fracture involving the upper humeral shaft. The humeral head and neck are intact. The [facility] joint is intact and the glenohumeral joint is intact IMPRESSION: No shoulder fracture or dislocation. Displaced spiral type fracture of the proximal humeral shaft. Electronically Signed   By: Rudie Meyer M.D.   On: 07/20/2017 17:06   Dg Humerus Left  Result Date: 07/20/2017 CLINICAL DATA:  Larey Seat today and injured left arm. EXAM: LEFT HUMERUS - 2+ VIEW COMPARISON:  None. FINDINGS: There is a displaced spiral type fracture of the proximal humeral shaft with anterior displacement. No involvement of the shoulder or elbow joints. IMPRESSION: Displaced proximal humeral shaft fracture. Electronically Signed   By: Rudie Meyer M.D.   On: 07/20/2017 17:07   Dg Hip Unilat With Pelvis 2-3 Views Left  Result Date: 07/20/2017 CLINICAL DATA:  Larey Seat today and injured  left hip. EXAM: DG HIP (WITH OR WITHOUT PELVIS) 2-3V LEFT COMPARISON:  None. FINDINGS: Both hips are normally located. Moderate degenerative changes bilaterally, left greater than right. No definite hip fracture. The pubic symphysis and SI joints are intact. No definite pelvic fractures. IMPRESSION: Left hip joint degenerative changes but no acute hip or pelvic fracture. Electronically Signed   By: Rudie Meyer M.D.   On: 07/20/2017 17:08    Assessment and Plan:   1. Near-syncope - Pt has had 2 episodes recently that were orthostatic in nature. -Orthostatic vital signs were not checked on admission.  When they were checked today, there was a 10 point drop in her systolic blood pressure going from lying or sitting to standing.  Her blood pressure improved by 3  minutes. -She had not eaten all day at that point, but her glucose was 107 when it was checked in the ER.  - She had just come back from urinating.  - She was not wearing her oxygen.  O2 sats of the house are unknown but her O2 saturation has been in the 90s here.  However, it was not checked with exertion. -Her echo is normal. -She will need an event monitor after discharge and then follow-up with cardiology. -She will be encouraged to make sure that she has scheduled p.o. intake throughout the day and does not wait until suppertime to have anything to eat or much of anything to drink. -She should be on telemetry while she is in the hospital  2.  Preoperative evaluation: -She has no history of ischemic symptoms. -Her activity level is poor, but this is largely related to back problems and respiratory issues -Her EF is normal with no wall motion abnormalities -From a cardiac standpoint, no further workup is indicated and she is at acceptable risk for the surgery. -Agree with Pulmonary that her pulmonary risk of general anesthesia is increased.  Active Problems:   COPD exacerbation (HCC)   Arm fracture, right   Fall   Signed, Leanna Battles  07/21/2017 2:23 PM  History and all data above reviewed.  Patient examined.  I agree with the findings as above.   The patient denies chest pain.  She denies any new cardiovascular symptoms.  She has chronic lung disease as above and she had a fall with questionable near syncope with trauma to her arm.  She did not have frank LOC.   She has had a negative cardiac work up to date.  Echo was normal.  Cardiac enzymes normal, EKG normal.  The patient exam reveals COR:RRR  ,  Lungs: Clear  ,  Abd: Positive bowel sounds, no rebound no guarding, Ext No edema  .  All available labs, radiology testing, previous records reviewed. Agree with documented assessment and plan. Near syncope:  Not clear if this was a mechanical fall or near syncope.  Regardless  the work up thus far demonstrates no acute cardiac abnormalities.  It would be prudent to have her on telemetry while she is here.  I would also suggest an out patient event monitor.  Preop:  Low risk.  No further cardiovascular testing.    Fayrene Fearing Dejha King  3:05 PM  07/21/2017

## 2017-07-21 NOTE — Evaluation (Signed)
Physical Therapy Evaluation Patient Details Name: Nichole RanchDrinda S Morrison MRN: [id number] DOB: 11/08/1965 Today's Date: 07/21/2017   History of Present Illness  51 y.o. female admitted on 07/20/17 for fall with left displaced proximal humeral shaft fx. Pt with significant PMH of SOB on 2-4 L O2 [place] at home at baseline, HTN, COPD, lumbar laminectomy and decompression, and neck surgery.  Clinical Impression  Pt was able to sit EOB and stand EOB with heavy reliance on railing for mobility. Focus of this eval was to prepare her for what to expect post op and to get orthostic vitals.  PT to follow acutely for deficits listed below.   Possible OR later today.     Follow Up Recommendations No PT follow up;Other (comment);Supervision for mobility/OOB(as long as she does well post op)    Equipment Recommendations  None recommended by PT    Recommendations for Other Services   NA    Precautions / Restrictions Precautions Precautions: Fall Restrictions Other Position/Activity Restrictions: presumed NWB left arm in sling      Mobility  Bed Mobility Overal bed mobility: Needs Assistance Bed Mobility: Supine to Sit;Sit to Supine     Supine to sit: Min assist;HOB elevated Sit to supine: Mod assist;HOB elevated   General bed mobility comments: Min assist to help progress bil legs to EOB.  HOB maximally elevated to get up and down.  Pt using bedrails for leverage on the right side.  Per pt report, she sleeps most nights in a recliner chair due to her breathing issues.   Transfers Overall transfer level: Needs assistance Equipment used: None Transfers: Sit to/from Stand Sit to Stand: Min guard         General transfer comment: Min guard assist for safety from her right side.  Pt using bed rail for transitions.  [facility]    Ambulation/Gait             General Gait Details: deferred due to pain until shoulder is stabilized.           Balance  Overall balance assessment: Needs assistance Sitting-balance support: Feet supported;Single extremity supported Sitting balance-Leahy Scale: Fair     Standing balance support: Single extremity supported Standing balance-Leahy Scale: Fair                               Pertinent Vitals/Pain Pain Assessment: 0-10 Pain Score: 10-Worst pain ever Pain Location: left arm Pain Descriptors / Indicators: Grimacing;Guarding Pain Intervention(s): Limited activity within patient's tolerance;Monitored during session;Repositioned;RN gave pain meds during session    Home Living Family/patient expects to be discharged to:: Private residence Living Arrangements: Spouse/significant other Available Help at Discharge: Family                  Prior Function Level of Independence: Independent               Hand Dominance   Dominant Hand: Right    Extremity/Trunk Assessment   Upper Extremity Assessment Upper Extremity Assessment: Defer to OT evaluation    Lower Extremity Assessment Lower Extremity Assessment: Overall WFL for tasks assessed    Cervical / Trunk Assessment Cervical / Trunk Assessment: Other exceptions Cervical / Trunk Exceptions: h/o low back and neck surgery  Communication   Communication: No difficulties  Cognition Arousal/Alertness: Awake/alert Behavior During Therapy: WFL for tasks assessed/performed Overall Cognitive Status: Within Functional Limits for tasks  assessed                                        General Comments General comments (skin integrity, edema, etc.): Orthostatic vitals taken.         Assessment/Plan    PT Assessment Patient needs continued PT services  PT Problem List Decreased strength;Decreased range of motion;Decreased activity tolerance;Decreased balance;Decreased mobility;Decreased knowledge of use of DME;Decreased knowledge of precautions;Pain       PT Treatment Interventions DME  instruction;Gait training;Stair training;Functional mobility training;Therapeutic activities;Therapeutic exercise;Balance training;Neuromuscular re-education;Patient/family education;Modalities    PT Goals (Current goals can be found in the Care Plan section)  Acute Rehab PT Goals Patient Stated Goal: to get her pain better PT Goal Formulation: With patient/family Time For Goal Achievement: 07/28/17 Potential to Achieve Goals: Good    Frequency Min 5X/week           AM-PAC PT "6 Clicks" Daily Activity  Outcome Measure Difficulty turning over in bed (including adjusting bedclothes, sheets and blankets)?: Unable Difficulty moving from lying on back to sitting on the side of the bed? : Unable Difficulty sitting down on and standing up from a chair with arms (e.g., wheelchair, bedside commode, etc,.)?: Unable Help needed moving to and from a bed to chair (including a wheelchair)?: A Little Help needed walking in hospital room?: A Little Help needed climbing 3-5 steps with a railing? : A Little 6 Click Score: 12    End of Session Equipment Utilized During Treatment: Other (comment)(L shoulder sling) Activity Tolerance: Patient limited by pain Patient left: in bed;with call bell/phone within reach;with family/visitor present Nurse Communication: Mobility status PT Visit Diagnosis: History of falling (Z91.81);Pain Pain - Right/Left: Left Pain - part of body: Arm    Time: [id number] PT Time Calculation (min) (ACUTE ONLY): 23 min   Charges:     Lurena Joinerebecca B. Takoda Janowiak, PT, DPT [phone]    PT Evaluation $PT Eval Moderate Complexity: 1 Mod PT Treatments $Therapeutic Activity: 8-22 mins   07/21/2017, 11:55 AM

## 2017-07-21 NOTE — Progress Notes (Signed)
Orthopedics Progress Note  Subjective: Patient complains of increased LBP and left hip pain.  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  Objective:  Vitals:   07/21/17 1339 07/21/17 1500  BP:  106/75  Pulse:  93  Resp:  16  Temp:  99 F (37.2 C)  SpO2: 94% 93%    General: Awake and alert  Musculoskeletal: left arm in sling and NVI Neurovascularly intact  Lab Results  Component Value Date   WBC 12.3 (H) 07/21/2017   HGB 14.7 07/21/2017   HCT 46.4 (H) 07/21/2017   MCV 93.5 07/21/2017   PLT 250 07/21/2017       Component Value Date/Time   NA 140 07/21/2017 0609   K 4.5 07/21/2017 0609   CL 98 (L) 07/21/2017 0609   CO2 35 (H) 07/21/2017 0609   GLUCOSE 80 07/21/2017 0609   BUN <5 (L) 07/21/2017 0609   CREATININE 0.77 07/21/2017 0609   CALCIUM 8.6 (L) 07/21/2017 0609   GFRNONAA >60 07/21/2017 0609   GFRAA >60 07/21/2017 0609    No results found for: INR, PROTIME  Assessment/Plan: Plan CLOSED INTRAMEDULLARY (IM) NAIL LEFT HUMERUS Discussed with the patient that I have reviewed her case with Dr Aundria Rudogers who will plan on surgery Wednesday. Continue optimization tomorrow with rest and diet. Pain control. Nicotine patch.  Almedia BallsSteven R. Ranell PatrickNorris, MD 07/21/2017 5:22 PM

## 2017-07-21 NOTE — Consult Note (Signed)
Name: Nichole RanchDrinda S Mcbrien MRN: [id number] DOB: 01/10/1966    ADMISSION DATE:  07/20/2017 CONSULTATION DATE:  11/12  REFERRING MD :  FPTS   CHIEF COMPLAINT: pre-op Pulmonary clearance   BRIEF PATIENT DESCRIPTION: 51 year old female active smoker with history of HTN, chronic pain, GERD, gold stage IV O2 dependent COPD [facility] with frequent exacerbations, history of OSA not on CPAP at home.  She presented 11/11 after a fall at home sustaining displaced left humeral fracture.  PCCM consulted for preop pulmonary clearance prior to orthopedic surgical repair.  SIGNIFICANT EVENTS    STUDIES:  L humerus 11/11>>> Displaced proximal humeral shaft fracture.    HISTORY OF PRESENT ILLNESS:  51 year old female active smoker with history of gold stage IV O2 dependent COPD [facility] with frequent exacerbations, history of OSA not on CPAP at home.  She presented 11/11 after a fall at home sustaining displaced left humeral fracture.  PCCM consulted for preop pulmonary clearance prior to orthopedic surgical repair.  Currently shortness of breath.  Complains of left arm pain, neck pain, chronic back pain.  At baseline her breathing is poor.  She can walk to the bathroom but no further without getting significantly short of breath.  She does not do shopping or household chores for herself.  Continues to smoke at least one pack per day.  Sounds like her fall was related to near syncope and she admits she was walking around the house without her oxygen on.  PAST MEDICAL HISTORY :   has a past medical history of Allergic rhinitis, Asthma, COPD (chronic obstructive pulmonary disease) (HCC), GERD (gastroesophageal reflux disease), Hypertension, and Shortness of breath.  has a past surgical history that includes Neck surgery (Sept 2005); Total abdominal hysterectomy (Oct 2003); Tubal ligation (1995); and LUMBAR LAMINECTOMY/DECOMPRESSION MICRODISCECTOMY (N/A,  04/06/2012). Prior to Admission medications   Medication Sig Start Date End Date Taking? Authorizing Provider  albuterol (PROVENTIL) (2.5 MG/3ML) 0.083% nebulizer solution Take 2.5 mg every 4 (four) hours as needed by nebulization for wheezing or shortness of breath.    Yes [provider]  azithromycin (ZITHROMAX) 250 MG tablet Take 250 mg daily by mouth. Continuous course   Yes [provider]  budesonide-formoterol (SYMBICORT) 160-4.5 MCG/ACT inhaler Inhale 2 puffs 2 (two) times daily into the lungs.   Yes [provider]  cetirizine (ZYRTEC) 10 MG tablet Take 10 mg daily by mouth.   Yes [provider]  fluticasone (FLONASE) 50 MCG/ACT nasal spray Place 1 spray daily into both nostrils.    Yes [provider]  furosemide (LASIX) 40 MG tablet Take 40 mg daily as needed by mouth for fluid or edema (ankle swelling).   Yes [provider]  Guaifenesin (MUCINEX MAXIMUM STRENGTH) 1200 MG TB12 Take 1,200 mg daily as needed by mouth (to thin phlegm/cough).   Yes [provider]  HYDROcodone-acetaminophen (NORCO) 10-325 MG tablet Take 1 tablet 3 (three) times daily as needed by mouth (pain).   Yes [provider]  losartan (COZAAR) 100 MG tablet Take 100 mg daily by mouth.  12/04/11  Yes [provider]  montelukast (SINGULAIR) 10 MG tablet Take 10 mg at bedtime by mouth.   Yes [provider]  Morphine-Naltrexone (EMBEDA) 30-1.2 MG CPCR Take 1 capsule 2 (two) times daily by mouth. Embeda   Yes [provider]  naproxen sodium (ALEVE) 220 MG tablet Take 440 mg 2 (two) times daily as needed by mouth (back & leg pain/headache).  Yes [provider]  nystatin-triamcinolone ointment (MYCOLOG) Apply 1 application 2 (two) times daily as needed topically (rash).  04/15/17  Yes [provider]  omeprazole (PRILOSEC) 40 MG capsule Take 40 mg daily by mouth.    Yes [provider]  OXYGEN  Inhale 3-4 L continuous into the lungs.   Yes [provider]  predniSONE (DELTASONE) 10 MG tablet Take 10 mg daily by mouth.   Yes [provider]  Tiotropium Bromide Monohydrate (SPIRIVA RESPIMAT) 2.5 MCG/ACT AERS Inhale 1 puff daily into the lungs.   Yes [provider]  Aclidinium Bromide (TUDORZA PRESSAIR) 400 MCG/ACT AEPB Inhale 1 puff into the lungs 2 (two) times daily. Patient not taking: Reported on 07/20/2017 04/29/12   Barbaraann Share, MD  Morphine Sulfate ER [facility] ER) 30 MG T12A Take 30 mg 2 (two) times daily by mouth.    [provider]   Allergies  Allergen Reactions  . Amoxicillin-Pot Clavulanate Itching    Ok with benadryl  . Erythromycin Rash    Ok with benadryl   . Levofloxacin Itching    Ok with benadryl  . Moxifloxacin Itching    Reaction to Avelox - ok with benadryl  . Sulfamethoxazole-Trimethoprim Itching    Ok with benadryl    FAMILY HISTORY:  family history includes Emphysema in her father and mother; Lung cancer in her father; Rheum arthritis in her father. SOCIAL HISTORY:  reports that she has been smoking cigarettes.  She has a 30.00 pack-year smoking history. she has never used smokeless tobacco. She reports that she drinks alcohol. She reports that she does not use drugs.  REVIEW OF SYSTEMS:   As per HPI - All other systems reviewed and were neg.    SUBJECTIVE:   VITAL SIGNS: Temp:  [98.6 F (37 C)-99.7 F (37.6 C)] 99.7 F (37.6 C) (11/12 0650) Pulse Rate:  [80-102] 100 (11/12 0650) Resp:  [13-22] 18 (11/12 0650) BP: (114-144)/(63-87) 120/68 (11/12 0650) SpO2:  [94 %-99 %] 94 % (11/12 0650) Weight:  [95.3 kg (210 lb)] 95.3 kg (210 lb) (11/11 1516)  PHYSICAL EXAMINATION: General: Obese, chronically ill-appearing female, appears greater than stated age Neuro: Awake, alert, appropriate, does appear drowsy at times HEENT: MM moist Cardiovascular: S1-S2 regular rate and rhythm Lungs: 4 L nasal cannula,  diminished throughout, no audible wheeze Abdomen: Obese, soft, nontender, positive bowel sounds Musculoskeletal: Warm and dry, scant B LE edema, left arm in sling   Recent Labs  Lab 07/20/17 1547 07/21/17 0609  NA 132* 140  K 4.5 4.5  CL 95* 98*  CO2 28 35*  BUN <5* <5*  CREATININE 0.65 0.77  GLUCOSE 107* 80   Recent Labs  Lab 07/20/17 1547 07/21/17 0609  HGB 15.1* 14.7  HCT 45.8 46.4*  WBC 21.7* 12.3*  PLT 231 250   Dg Chest 1 View  Result Date: 07/20/2017 CLINICAL DATA:  Larey Seat this morning and injured left arm. EXAM: CHEST 1 VIEW COMPARISON:  Chest x-ray 03/31/2012 FINDINGS: The cardiac silhouette, mediastinal and hilar contours are within normal limits and stable. Mild tortuosity of the thoracic aorta. No acute pulmonary findings. No pleural effusion or pneumothorax. No obvious rib fractures. Proximal left humeral shaft fracture is noted. IMPRESSION: No acute cardiopulmonary findings. Electronically Signed   By: Rudie Meyer M.D.   On: 07/20/2017 17:13   Dg Forearm Left  Result Date: 07/20/2017 CLINICAL DATA:  Larey Seat today and injured left arm. EXAM: LEFT FOREARM - 2 VIEW COMPARISON:  None.  FINDINGS: The wrist and elbow joints are maintained.  No forearm fractures. IMPRESSION: No acute forearm fracture. Electronically Signed   By: Rudie Meyer M.D.   On: 07/20/2017 17:07   Dg Shoulder Left  Result Date: 07/20/2017 CLINICAL DATA:  Larey Seat this morning and injured left arm. EXAM: LEFT SHOULDER - 2+ VIEW COMPARISON:  None. FINDINGS: Spiral type fracture involving the upper humeral shaft. The humeral head and neck are intact. The [facility] joint is intact and the glenohumeral joint is intact IMPRESSION: No shoulder fracture or dislocation. Displaced spiral type fracture of the proximal humeral shaft. Electronically Signed   By: Rudie Meyer M.D.   On: 07/20/2017 17:06   Dg Humerus Left  Result Date: 07/20/2017 CLINICAL DATA:  Larey Seat today and injured left arm. EXAM: LEFT HUMERUS - 2+  VIEW COMPARISON:  None. FINDINGS: There is a displaced spiral type fracture of the proximal humeral shaft with anterior displacement. No involvement of the shoulder or elbow joints. IMPRESSION: Displaced proximal humeral shaft fracture. Electronically Signed   By: Rudie Meyer M.D.   On: 07/20/2017 17:07   Dg Hip Unilat With Pelvis 2-3 Views Left  Result Date: 07/20/2017 CLINICAL DATA:  Larey Seat today and injured left hip. EXAM: DG HIP (WITH OR WITHOUT PELVIS) 2-3V LEFT COMPARISON:  None. FINDINGS: Both hips are normally located. Moderate degenerative changes bilaterally, left greater than right. No definite hip fracture. The pubic symphysis and SI joints are intact. No definite pelvic fractures. IMPRESSION: Left hip joint degenerative changes but no acute hip or pelvic fracture. Electronically Signed   By: Rudie Meyer M.D.   On: 07/20/2017 17:08    ASSESSMENT / PLAN:  Severe COPD-  O2 dependent at home, prednisone dependent at 10 mg/day.  Still actively smoking at least 1 PPD.  Significantly limited by her shortness of breath at baseline.  Has been refused elective back surgery in the past related to her respiratory status.  Followed by [facility] pulmonology.  Had recent PFTs but I cannot pull up the actual result values.  No signs of acute exacerbation at this time History of OSA-not on CPAP at home  [facility] -  Change duo nebs to every 6 hours Continue prednisone 10 mg/day Continuous O2 at 3-4 L or as needed to keep O2 sats 88-92% Pulmonary hygiene Mucolytic as needed Continue Singulair and Dulera Smoking cessation discussed at length Limit narcotics as able Would strongly consider nightly CPAP postop if patient is willing-discussed with her and she will think about it   She is at significantly increased risk for perioperative pulmonary complications including failure to wean from the ventilator and would not be ideal candidate for elective surgeries, but seems as though this is a necessary  procedure for her humerus fracture.  We discussed this at length and she is willing to proceed with surgery.  We will continue to follow postop and monitor her respiratory status closely.   Dirk Dress, NP 07/21/2017  11:22 AM Pager: [phone] or [phone]  STAFF NOTE: I, Rory Percy, MD FACP have personally reviewed patient's available data, including medical history, events of note, physical examination and test results as part of my evaluation. I have discussed with resident/NP and other care providers such as pharmacist, RN and RRT. In addition, I personally evaluated patient and elicited key findings of: awake alert, JVD present, sling left arm, no overt wheezing, slight coarse BS diffuse , no distress, abdo obese, no sig edema, pcxr I reviewed all from past with chronic int  changes and possible now mild edema on top, she is pred dependent at 10 mg, 4 liters home O2 and frequent exacerbations at baseline placing her at HIGH risk resp peri operative complication, I discussed this risk in detail with her including prolonged ventilation post op, trachesotomy need and major morbidity, seems her functioanl status and pain requires operation for humerous and she accepts this risk, I am unclear the reason for azithromycin, consider dc, change to more frequent nebs, continued pred , no role bolus and increased dosing preventitive and may inhibit wound healing post op if not needed, sat goal 88%, consider lasix to neg balance further to reduce any edmea components, will follow and assess plans and timign from OR, would be reasonable to keep intubated post op and allow her to to be assessed for extubation in icu, I updated pt and husband in room  Mcarthur RossettiDaniel J. Tyson AliasFeinstein, MD, FACP Pgr: [phone] [facility] 07/21/2017 1:33 PM

## 2017-07-21 NOTE — Discharge Summary (Signed)
[facility]  Patient name: Nichole RanchDrinda S Morrison Medical record number: [id number] Date of birth: September 02, 1966 Age: 51 y.o. Gender: female Date of Admission: 07/20/2017  Date of Discharge: 07/24/2017 Admitting Physician: Nichole ElandKehinde T Eniola, MD  Primary Care Provider: [name], Nichole C, MD Consultants: Orthosurgery, Pulmonology, Cardiology  Indication for Hospitalization: humeral fracture  Discharge Diagnoses/Problem List:  L displaced spiral humeral fracture, improved Chronic pain, stable COPD, stable HTN, stable GERD, stable  Disposition: Home  Discharge Condition: Improved  Discharge Exam:  General: NAD, resting in bed, obese Cardiovascular: RRR no mrg Respiratory: currently receiving scheduled duoneb, no increased work of breathing, occasional dry cough. Diffuse end expiratory wheezes.  Abdomen: soft, nontender, nondistended. Extremities: left arm in sling, some bruising noted to L elbow. No lower extremity edema.  Brief Hospital Course:  Nichole HackerDrinda S Morrison a 51 y.o.femalewith PMH significant forsevere COPD on 3-4 L at home,azithromycin and prednisone,morbid obesity, hypertension,chronic pain presenting after presyncopal episode and ground-level fall resulting inleftdisplaced spiral proximal humeral fracture. Cardiac workup largely unrevealing with Cardiology recommending event monitor outpatient to further assess etiology for fall. Pulmonology stated would likely have complicated post-surgical course due to severe COPD. Patient underwent surgical fixation of humeral fracture on 11/14 with no significant complications. Patient did well on home O2 prior to discharge and pain well controlled.  Issues for Follow Up:  1. Cardiology recommendations: Outpatient event monitor, will follow up with Cardiology outpatient. 2. Pain control regimen: 1. Dilaudid 1mg  q2h PRN for 2 days, then q6h PRN for 5 days 2. Norco 10-325mg  TID PRN 3. Ensure  continued success on home O2 of 3-4 L.  Significant Procedures: Closed intramedullary nail fixation of spiral humeral fracture  Significant Labs and Imaging:  Recent Labs  Lab 07/22/17 0544 07/23/17 0506 07/24/17 0640  WBC 14.2* 13.9* 17.5*  HGB 14.6 13.7 12.6  HCT 45.8 42.7 38.4  PLT 246 223 244   Recent Labs  Lab 07/20/17 1547 07/21/17 0609 07/22/17 0544 07/23/17 0506 07/24/17 0640  NA 132* 140 140 139 136  K 4.5 4.5 3.9 3.8 4.0  CL 95* 98* 99* 102 97*  CO2 28 35* 33* 31 30  GLUCOSE 107* 80 79 88 120*  BUN <5* <5* 5* 7 8  CREATININE 0.65 0.77 0.70 0.63 0.61  CALCIUM 8.8* 8.6* 8.9 8.5* 8.3*  ALKPHOS 75 71  --   --   --   AST 27 21  --   --   --   ALT 23 21  --   --   --   ALBUMIN 3.9 3.3*  --   --   --     Dg Humerus Left  Result Date: 07/23/2017 CLINICAL DATA:  Status post surgical internal fixation of left humeral shaft fracture. EXAM: LEFT HUMERUS - 2+ VIEW COMPARISON:  Radiographs of July 20, 2017. FINDINGS: Status post intramedullary rod fixation of oblique fracture involving proximal left humeral shaft. Good alignment of fracture components is noted. Surgical staples are noted. IMPRESSION: Status post intramedullary rod fixation of proximal left humeral shaft fracture. Electronically Signed   By: Lupita RaiderJames  Green Jr, M.D.   On: 07/23/2017 16:35   Dg Humerus Left  Result Date: 07/23/2017 CLINICAL DATA:  ORIF left humerus EXAM: DG C-ARM 61-120 MIN; LEFT HUMERUS - 2+ VIEW COMPARISON:  None FLUOROSCOPY TIME:  1 minutes 16 seconds FINDINGS: Oblique fracture of the proximal left humeral diaphysis transfixed with a intramedullary nail with multiple interlocking screws without failure or complication. Fracture is in  near anatomic alignment. IMPRESSION: Interval left humeral fracture ORIF. Electronically Signed   By: Elige KoHetal  Patel   On: 07/23/2017 15:49   Dg C-arm 61-120 Min  Result Date: 07/23/2017 CLINICAL DATA:  ORIF left humerus EXAM: DG C-ARM 61-120 MIN; LEFT  HUMERUS - 2+ VIEW COMPARISON:  None FLUOROSCOPY TIME:  1 minutes 16 seconds FINDINGS: Oblique fracture of the proximal left humeral diaphysis transfixed with a intramedullary nail with multiple interlocking screws without failure or complication. Fracture is in near anatomic alignment. IMPRESSION: Interval left humeral fracture ORIF. Electronically Signed   By: Elige KoHetal  Patel   On: 07/23/2017 15:49   Results/Tests Pending at Time of Discharge: None  Discharge Medications:  Allergies as of 07/25/2017      Reactions   Amoxicillin-pot Clavulanate Itching   Ok with benadryl   Erythromycin Rash   Ok with benadryl    Levofloxacin Itching   Ok with benadryl   Moxifloxacin Itching   Reaction to Avelox - ok with benadryl   Sulfamethoxazole-trimethoprim Itching   Ok with benadryl      Medication List    STOP taking these medications   EMBEDA 30-1.2 MG Cpcr Generic drug:  Morphine-Naltrexone   naproxen sodium 220 MG tablet Commonly known as:  ALEVE     TAKE these medications   Aclidinium Bromide 400 MCG/ACT Aepb Commonly known as:  TUDORZA PRESSAIR Inhale 1 puff into the lungs 2 (two) times daily.   albuterol (2.5 MG/3ML) 0.083% nebulizer solution Commonly known as:  PROVENTIL Take 2.5 mg every 4 (four) hours as needed by nebulization for wheezing or shortness of breath.   azithromycin 250 MG tablet Commonly known as:  ZITHROMAX Take 250 mg daily by mouth. Continuous course   budesonide-formoterol 160-4.5 MCG/ACT inhaler Commonly known as:  SYMBICORT Inhale 2 puffs 2 (two) times daily into the lungs.   cetirizine 10 MG tablet Commonly known as:  ZYRTEC Take 10 mg daily by mouth.   fluticasone 50 MCG/ACT nasal spray Commonly known as:  FLONASE Place 1 spray daily into both nostrils.   furosemide 40 MG tablet Commonly known as:  LASIX Take 40 mg daily as needed by mouth for fluid or edema (ankle swelling).   HYDROcodone-acetaminophen 10-325 MG tablet Commonly known as:   NORCO Take 1 tablet 3 (three) times daily as needed for up to 10 days by mouth (pain).   HYDROmorphone 2 MG tablet Commonly known as:  DILAUDID Take 0.5 tablets (1 mg total) every 6 (six) hours as needed for up to 7 days by mouth for severe pain.   losartan 100 MG tablet Commonly known as:  COZAAR Take 100 mg daily by mouth.   montelukast 10 MG tablet Commonly known as:  SINGULAIR Take 10 mg at bedtime by mouth.   MORPHABOND ER 30 MG T12a Generic drug:  Morphine Sulfate ER Take 30 mg 2 (two) times daily by mouth.   MUCINEX MAXIMUM STRENGTH 1200 MG Tb12 Generic drug:  Guaifenesin Take 1,200 mg daily as needed by mouth (to thin phlegm/cough).   nystatin-triamcinolone ointment Commonly known as:  MYCOLOG Apply 1 application 2 (two) times daily as needed topically (rash).   omeprazole 40 MG capsule Commonly known as:  PRILOSEC Take 40 mg daily by mouth.   OXYGEN Inhale 3-4 L continuous into the lungs.   predniSONE 10 MG tablet Commonly known as:  DELTASONE Take 10 mg daily by mouth.   SPIRIVA RESPIMAT 2.5 MCG/ACT Aers Generic drug:  Tiotropium Bromide Monohydrate Inhale 1 puff daily  into the lungs.       Discharge Instructions: Please refer to Patient Instructions section of EMR for full details.  Patient was counseled important signs and symptoms that should prompt return to medical care, changes in medications, dietary instructions, activity restrictions, and follow up appointments.   Follow-Up Appointments: Follow-up Information    Abelino Derrick, PA-C Follow up on 09/08/2017.   Specialties:  Cardiology, Radiology Why:  The office will call you regarding getting the event monitor put on.  Please arrive at 2:15 pm for a 2:30 pm appt. Contact information: [address] [place] [address] [phone]        Yolonda Kida, MD. Schedule an appointment as soon as possible for a visit in 2 weeks.   Specialty:  Orthopedic Surgery Why:  For  wound re-check, For suture removal Contact information: [address] [place] [address] [phone]           Ellwood Dense, DO 07/26/2017, 4:03 PM PGY-1, [facility]

## 2017-07-22 ENCOUNTER — Other Ambulatory Visit: Payer: Self-pay

## 2017-07-22 ENCOUNTER — Inpatient Hospital Stay (HOSPITAL_COMMUNITY): Payer: BLUE CROSS/BLUE SHIELD

## 2017-07-22 DIAGNOSIS — M549 Dorsalgia, unspecified: Secondary | ICD-10-CM

## 2017-07-22 DIAGNOSIS — R0902 Hypoxemia: Secondary | ICD-10-CM

## 2017-07-22 DIAGNOSIS — Z7189 Other specified counseling: Secondary | ICD-10-CM

## 2017-07-22 DIAGNOSIS — G8929 Other chronic pain: Secondary | ICD-10-CM

## 2017-07-22 LAB — BASIC METABOLIC PANEL
ANION GAP: 8 (ref 5–15)
BUN: 5 mg/dL — ABNORMAL LOW (ref 6–20)
CALCIUM: 8.9 mg/dL (ref 8.9–10.3)
CO2: 33 mmol/L — ABNORMAL HIGH (ref 22–32)
Chloride: 99 mmol/L — ABNORMAL LOW (ref 101–111)
Creatinine, Ser: 0.7 mg/dL (ref 0.44–1.00)
GLUCOSE: 79 mg/dL (ref 65–99)
Potassium: 3.9 mmol/L (ref 3.5–5.1)
Sodium: 140 mmol/L (ref 135–145)

## 2017-07-22 LAB — CBC
HCT: 45.8 % (ref 36.0–46.0)
Hemoglobin: 14.6 g/dL (ref 12.0–15.0)
MCH: 29.9 pg (ref 26.0–34.0)
MCHC: 31.9 g/dL (ref 30.0–36.0)
MCV: 93.9 fL (ref 78.0–100.0)
PLATELETS: 246 10*3/uL (ref 150–400)
RBC: 4.88 MIL/uL (ref 3.87–5.11)
RDW: 14.4 % (ref 11.5–15.5)
WBC: 14.2 10*3/uL — AB (ref 4.0–10.5)

## 2017-07-22 MED ORDER — CHLORHEXIDINE GLUCONATE 4 % EX LIQD: 60.0000 mL | Freq: Once | CUTANEOUS | Status: DC

## 2017-07-22 MED ORDER — CYCLOBENZAPRINE HCL 10 MG PO TABS
5.0000 mg | ORAL_TABLET | Freq: Three times a day (TID) | ORAL | Status: DC | PRN
  Administered 2017-07-22 – 2017-07-25 (×7): 5 mg via ORAL
  Filled 2017-07-22 (×7): qty 1

## 2017-07-22 MED ORDER — POVIDONE-IODINE 10 % EX SWAB: 2.0000 "application " | Freq: Once | CUTANEOUS | Status: DC

## 2017-07-22 MED ORDER — IPRATROPIUM-ALBUTEROL 0.5-2.5 (3) MG/3ML IN SOLN
3.0000 mL | Freq: Three times a day (TID) | RESPIRATORY_TRACT | Status: DC
  Administered 2017-07-23 – 2017-07-25 (×6): 3 mL via RESPIRATORY_TRACT
  Filled 2017-07-22 (×6): qty 3

## 2017-07-22 MED ORDER — METHOCARBAMOL 1000 MG/10ML IJ SOLN
500.0000 mg | Freq: Four times a day (QID) | INTRAVENOUS | Status: DC | PRN
  Administered 2017-07-22 (×2): 500 mg via INTRAVENOUS
  Filled 2017-07-22 (×3): qty 5

## 2017-07-22 MED ORDER — CEFAZOLIN SODIUM-DEXTROSE 2-4 GM/100ML-% IV SOLN
2.0000 g | INTRAVENOUS | Status: AC
  Administered 2017-07-23: 2 g via INTRAVENOUS
  Filled 2017-07-22 (×3): qty 100

## 2017-07-22 NOTE — Progress Notes (Addendum)
Name: Nichole RanchDrinda S Morrison MRN: [id number] DOB: 1965/12/28    ADMISSION DATE:  07/20/2017 CONSULTATION DATE:  11/12  REFERRING MD :  FPTS   CHIEF COMPLAINT: pre-op Pulmonary clearance   BRIEF PATIENT DESCRIPTION: 51 year old female active smoker with history of HTN, chronic pain, GERD, gold stage IV O2 dependent COPD [facility] with frequent exacerbations, history of OSA not on CPAP at home.  She presented 11/11 after a fall at home sustaining displaced left humeral fracture.  PCCM consulted for preop pulmonary clearance prior to orthopedic surgical repair.  SIGNIFICANT EVENTS    STUDIES:  L humerus 11/11>>> Displaced proximal humeral shaft fracture.    HISTORY OF PRESENT ILLNESS:  51 year old female active smoker with history of gold stage IV O2 dependent COPD [facility] with frequent exacerbations, history of OSA not on CPAP at home.  She presented 11/11 after a fall at home sustaining displaced left humeral fracture.  PCCM consulted for preop pulmonary clearance prior to orthopedic surgical repair.  Currently shortness of breath.  Complains of left arm pain, neck pain, chronic back pain.  At baseline her breathing is poor.  She can walk to the bathroom but no further without getting significantly short of breath.  She does not do shopping or household chores for herself.  Continues to smoke at least one pack per day.  Sounds like her fall was related to near syncope and she admits she was walking around the house without her oxygen on.  SUBJECTIVE: c/o diffused pain  VITAL SIGNS: Temp:  [98 F (36.7 C)-99 F (37.2 C)] 98 F (36.7 C) (11/13 0619) Pulse Rate:  [74-110] 74 (11/13 0619) Resp:  [16-18] 18 (11/13 0619) BP: (106-126)/(60-75) 126/73 (11/13 0619) SpO2:  [88 %-94 %] 94 % (11/13 0619)  PHYSICAL EXAMINATION: General: Chronically ill appearing female, NAD Neuro: Awake and interactive, moving all ext to command HEENT: [place]/AT,  PERRL, EOM-I and MMM Cardiovascular: RRR, Nl S1/S2, -M/R/G. Lungs: Decreased BS throughout Abdomen: Obese, soft, NT, ND and +BS Musculoskeletal: B lower ext edema, tenderness at fracture   Recent Labs  Lab 07/20/17 1547 07/21/17 0609 07/22/17 0544  NA 132* 140 140  K 4.5 4.5 3.9  CL 95* 98* 99*  CO2 28 35* 33*  BUN <5* <5* 5*  CREATININE 0.65 0.77 0.70  GLUCOSE 107* 80 79   Recent Labs  Lab 07/20/17 1547 07/21/17 0609 07/22/17 0544  HGB 15.1* 14.7 14.6  HCT 45.8 46.4* 45.8  WBC 21.7* 12.3* 14.2*  PLT 231 250 246   Dg Chest 1 View  Result Date: 07/20/2017 CLINICAL DATA:  Larey SeatFell this morning and injured left arm. EXAM: CHEST 1 VIEW COMPARISON:  Chest x-ray 03/31/2012 FINDINGS: The cardiac silhouette, mediastinal and hilar contours are within normal limits and stable. Mild tortuosity of the thoracic aorta. No acute pulmonary findings. No pleural effusion or pneumothorax. No obvious rib fractures. Proximal left humeral shaft fracture is noted. IMPRESSION: No acute cardiopulmonary findings. Electronically Signed   By: Rudie MeyerP.  Gallerani M.D.   On: 07/20/2017 17:13   Dg Forearm Left  Result Date: 07/20/2017 CLINICAL DATA:  Larey SeatFell today and injured left arm. EXAM: LEFT FOREARM - 2 VIEW COMPARISON:  None. FINDINGS: The wrist and elbow joints are maintained.  No forearm fractures. IMPRESSION: No acute forearm fracture. Electronically Signed   By: Rudie MeyerP.  Gallerani M.D.   On: 07/20/2017 17:07   Dg Shoulder Left  Result Date: 07/20/2017 CLINICAL DATA:  Larey SeatFell this morning and injured left arm.  EXAM: LEFT SHOULDER - 2+ VIEW COMPARISON:  None. FINDINGS: Spiral type fracture involving the upper humeral shaft. The humeral head and neck are intact. [facility] is intact and the glenohumeral joint is intact IMPRESSION: No shoulder fracture or dislocation. Displaced spiral type fracture of the proximal humeral shaft. Electronically Signed   By: Rudie MeyerP.  Gallerani M.D.   On: 07/20/2017 17:06   Dg Humerus  Left  Result Date: 07/20/2017 CLINICAL DATA:  Larey SeatFell today and injured left arm. EXAM: LEFT HUMERUS - 2+ VIEW COMPARISON:  None. FINDINGS: There is a displaced spiral type fracture of the proximal humeral shaft with anterior displacement. No involvement of the shoulder or elbow joints. IMPRESSION: Displaced proximal humeral shaft fracture. Electronically Signed   By: Rudie MeyerP.  Gallerani M.D.   On: 07/20/2017 17:07   Dg Hip Unilat With Pelvis 2-3 Views Left  Result Date: 07/20/2017 CLINICAL DATA:  Larey SeatFell today and injured left hip. EXAM: DG HIP (WITH OR WITHOUT PELVIS) 2-3V LEFT COMPARISON:  None. FINDINGS: Both hips are normally located. Moderate degenerative changes bilaterally, left greater than right. No definite hip fracture. The pubic symphysis and SI joints are intact. No definite pelvic fractures. IMPRESSION: Left hip joint degenerative changes but no acute hip or pelvic fracture. Electronically Signed   By: Rudie MeyerP.  Gallerani M.D.   On: 07/20/2017 17:08   I reviewed CXR myself, flat diaphragm but no acute disease noted.  ASSESSMENT / PLAN:  Severe COPD-  O2 dependent at home at 4L, prednisone dependent at 10 mg/day.  Still actively smoking at least 1 PPD.  Significantly limited by her shortness of breath at baseline.  Has been refused elective back surgery in the past related to her respiratory status.  [facility]  Had recent PFTs but I continue to be unable to access.  No signs of acute exacerbation at this time so no role for increasing this at this point as that will also prohibit wound healing History of OSA-not on CPAP at home  REC -  Continue duonebs to q6 Prednisone 10 mg PO daily, change to stress dose if patient remains intubated post op but no role for increase at this point Titrate O2 for sat of 88-92%, currently on 4L Pulmonary hygiene Mucolytics as needed [facility] [place] Smoking cessation discussed at length Limit narcotics as able specially if extubated  post Patient will think about CPAP, specially if narcotics are used post op this will be imparative Patient is a very high risk for general anesthesia from a pulmonary standpoint but is willing to take the risk including prolonged mechanical ventilation and tracheostomy.  Having said that the procedure is not elective at this point.  Would attempt extubation in the PACU as usual but would not be surprised ends up in the ICU for weaning.  Patient and husband want full support for now including trach until closer to make decision but does not wish for long term vent.  PCCM will see again if patient ends up in the ICU post op, surgery scheduled for AM.  Please notify PCCM if that is the case, otherwise, signing off.  Discussed with PCCM-NP.  Alyson ReedyWesam G. Olive Motyka, M.D. [facility]. Pager: [phone]. After hours pager: [phone]

## 2017-07-22 NOTE — Progress Notes (Signed)
Orthopedics Progress Note  Subjective: Complaining of more pain in the left leg and the low back today.  Objective:  Vitals:   07/22/17 0213 07/22/17 0619  BP: 112/60 126/73  Pulse: 85 74  Resp: 18 18  Temp: 98.4 F (36.9 C) 98 F (36.7 C)  SpO2: 94% 94%    General: Awake and alert  Musculoskeletal: Left UE in sling with normal ROM in the fingers  Neurovascularly intact  Lab Results  Component Value Date   WBC 14.2 (H) 07/22/2017   HGB 14.6 07/22/2017   HCT 45.8 07/22/2017   MCV 93.9 07/22/2017   PLT 246 07/22/2017       Component Value Date/Time   NA 140 07/22/2017 0544   K 3.9 07/22/2017 0544   CL 99 (L) 07/22/2017 0544   CO2 33 (H) 07/22/2017 0544   GLUCOSE 79 07/22/2017 0544   BUN 5 (L) 07/22/2017 0544   CREATININE 0.70 07/22/2017 0544   CALCIUM 8.9 07/22/2017 0544   GFRNONAA >60 07/22/2017 0544   GFRAA >60 07/22/2017 0544    No results found for: INR, PROTIME  Assessment/Plan: Plan for surgery tomorrow. Dr Junita Pushodgers to meet the patient later today CLOSED INTRAMEDULLARY (IM) NAIL LEFT HUMERUS Will check L spine films given the increase in LBP and left leg pain. She has pretty severe L hip OA and know spinal stenosis NPO after MN  Almedia BallsSteven R. Ranell PatrickNorris, MD 07/22/2017 7:44 AM

## 2017-07-22 NOTE — Evaluation (Signed)
Occupational Therapy Evaluation Patient Details Name: Nichole RanchDrinda S Morrison MRN: [id number] DOB: Mar 03, 1966 Today's Date: 07/22/2017    History of Present Illness 51 y.o. female admitted on 07/20/17 for fall with left displaced proximal humeral shaft fx. Pt with significant PMH of SOB on 2-4 L O2 [place] at home at baseline, HTN, COPD, lumbar laminectomy and decompression, and neck surgery.   Clinical Impression   Initiated sling education and  ADL compensatory techniques, handout provided. Pt scheduled for L UE surgery 07/23/2017 and OT will re assess at that time when appropriate per MD    Follow Up Recommendations  DC plan and follow up therapy as arranged by surgeon;Supervision - Intermittent    Equipment Recommendations  Other (comment)(TBD at next visit after L UE surgery)    Recommendations for Other Services       Precautions / Restrictions Precautions Precautions: Fall Required Braces or Orthoses: Sling Restrictions Other Position/Activity Restrictions: presumed NWB left arm in sling      Mobility Bed Mobility               General bed mobility comments: NT  Transfers                 General transfer comment: NT    Balance                                           ADL either performed or assessed with clinical judgement   ADL Overall ADL's : Needs assistance/impaired                                       General ADL Comments: initiated ADL compensatory techniques, handout provided. Pt scheduled for L UE surgery 07/23/2017 and OT will re assess at that time     Vision Baseline Vision/History: Wears glasses Wears Glasses: Reading only Patient Visual Report: No change from baseline       Perception     Praxis      Pertinent Vitals/Pain Pain Assessment: 0-10 Pain Score: 6  Pain Location: L UE Pain Descriptors / Indicators: Aching;Sore Pain Intervention(s): Monitored during session;Premedicated before  session;Repositioned;Ice applied     Hand Dominance Right   Extremity/Trunk Assessment Upper Extremity Assessment Upper Extremity Assessment: Generalized weakness;LUE deficits/detail LUE Deficits / Details: NWB, surgery scheduled for 11/14 LUE: Unable to fully assess due to immobilization   Lower Extremity Assessment Lower Extremity Assessment: Defer to PT evaluation   Cervical / Trunk Assessment Cervical / Trunk Assessment: Other exceptions Cervical / Trunk Exceptions: h/o low back and neck surgery   Communication Communication Communication: No difficulties   Cognition Arousal/Alertness: Awake/alert Behavior During Therapy: WFL for tasks assessed/performed Overall Cognitive Status: Within Functional Limits for tasks assessed                                     General Comments       Exercises     Shoulder Instructions      Home Living Family/patient expects to be discharged to:: Private residence Living Arrangements: Spouse/significant other Available Help at Discharge: Family Type of Home: House Home Access: Stairs to enter Secretary/administratorntrance Stairs-Number of Steps: 1 Entrance Stairs-Rails: Right;Can reach both;Left Home Layout: Two level;Full bath  on main level;1/2 bath on main level Alternate Level Stairs-Number of Steps: 10 Alternate Level Stairs-Rails: Left Bathroom Shower/Tub: Producer, television/film/videoWalk-in shower   Bathroom Toilet: Standard     Home Equipment: Environmental consultantWalker - 2 wheels;Shower seat;Grab bars - toilet;Hand held shower head          Prior Functioning/Environment Level of Independence: Independent                 OT Problem List: Decreased strength;Decreased activity tolerance;Decreased knowledge of use of DME or AE;Increased edema;Impaired UE functional use;Decreased range of motion;Impaired balance (sitting and/or standing);Decreased coordination;Pain      OT Treatment/Interventions: Self-care/ADL training;DME and/or AE instruction;Therapeutic  activities;Therapeutic exercise;Patient/family education    OT Goals(Current goals can be found in the care plan section) Acute Rehab OT Goals Patient Stated Goal: to get her pain better OT Goal Formulation: With patient/family Time For Goal Achievement: 07/29/17 Potential to Achieve Goals: Good ADL Goals Additional ADL Goal #1: Pt/family demo correct ADL tecniques Additional ADL Goal #2: Pt/family able to donn and doff slring properly Additional ADL Goal #3: Tolertae ROM exesrcies as prescribed per MD  OT Frequency: Min 2X/week   Barriers to D/C:    no barriers       Co-evaluation              AM-PAC PT "6 Clicks" Daily Activity     Outcome Measure Help from another person eating meals?: None Help from another person taking care of personal grooming?: A Little Help from another person toileting, which includes using toliet, bedpan, or urinal?: A Little Help from another person bathing (including washing, rinsing, drying)?: A Lot Help from another person to put on and taking off regular upper body clothing?: A Little Help from another person to put on and taking off regular lower body clothing?: A Lot 6 Click Score: 17   End of Session    Activity Tolerance: Patient tolerated treatment well Patient left: in bed;with family/visitor present  OT Visit Diagnosis: Muscle weakness (generalized) (M62.81);Pain;History of falling (Z91.81) Pain - Right/Left: Left Pain - part of body: Shoulder                Time: [id number] OT Time Calculation (min): 19 min Charges:  OT General Charges $OT Visit: 1 Visit OT Evaluation $OT Eval Low Complexity: 1 Low G-Codes: OT G-codes **NOT FOR INPATIENT CLASS** Functional Assessment Tool Used: AM-PAC 6 Clicks Daily Activity     [name], [name] 07/22/2017, 2:45 PM

## 2017-07-22 NOTE — Consult Note (Signed)
ORTHOPAEDIC CONSULTATION  REQUESTING PHYSICIAN: Nestor Ramp, MD  PCP:  [name], MD  Chief Complaint: Fall with left humerus fracture  HPI: [name] is a 51 y.o. female who complains of a fall prior to admission to the hospital onto the left arm.  This was a ground-level fall.  She was evaluated initially by my partner Dr. Ranell Patrick, and was unfortunately not clear for surgery due to needing cardiology as well as pulmonary medical consults.  Due to scheduling conflicts Dr. Ranell Patrick was going to have some difficulty getting her surgery done in a delayed fashion and I was asked for assistance.  [name] is a very pleasant lady who has multiple medical problems including chronic lumbar stenosis with left-sided radiculopathy, and unfortunately was not able to have surgical decompression due to poor pulmonary issues.  She currently is complaining of some back pain following a fall that is just a bit above her baseline.  She does also have morbid obesity as well as COPD hypertension, coronary artery disease, and asthma.  She currently is denying any weakness in the left arm other than the pain that she experiences, and also has a little bit of paresthesias in the median nerve distribution.  She states this could be related to her prior cervical spine radicular issues.  Past Medical History:  Diagnosis Date  . Allergic rhinitis   . Asthma   . COPD (chronic obstructive pulmonary disease) (HCC)    on home O2  . GERD (gastroesophageal reflux disease)   . Hypertension   . Shortness of breath    Past Surgical History:  Procedure Laterality Date  . NECK SURGERY  Sept 2005  . TOTAL ABDOMINAL HYSTERECTOMY  Oct 2003  . TUBAL LIGATION  1995   Social History   Socioeconomic History  . Marital status: Married    Spouse name: None  . Number of children: Y  . Years of education: None  . Highest education level: None  Social Needs  . Financial resource strain: None  . Food insecurity -  worry: None  . Food insecurity - inability: None  . Transportation needs - medical: None  . Transportation needs - non-medical: None  Occupational History  . Occupation: house wife  Tobacco Use  . Smoking status: Current Every Day Smoker    Packs/day: 1.00    Years: 30.00    Pack years: 30.00    Types: Cigarettes  . Smokeless tobacco: Never Used  . Tobacco comment: currently smoking 1/2 ppd.   Substance and Sexual Activity  . Alcohol use: Yes    Comment: occassionally  . Drug use: No  . Sexual activity: None  Other Topics Concern  . None  Social History Narrative   Pt lives in [place], with her husband.   Family History  Problem Relation Age of Onset  . Emphysema Father   . Rheum arthritis Father   . Lung cancer Father   . Emphysema Mother    Allergies  Allergen Reactions  . Amoxicillin-Pot Clavulanate Itching    Ok with benadryl  . Erythromycin Rash    Ok with benadryl   . Levofloxacin Itching    Ok with benadryl  . Moxifloxacin Itching    Reaction to Avelox - ok with benadryl  . Sulfamethoxazole-Trimethoprim Itching    Ok with benadryl   Prior to Admission medications   Medication Sig Start Date End Date Taking? Authorizing Provider  albuterol (PROVENTIL) (2.5 MG/3ML) 0.083% nebulizer solution Take 2.5 mg  every 4 (four) hours as needed by nebulization for wheezing or shortness of breath.    Yes [provider]  azithromycin (ZITHROMAX) 250 MG tablet Take 250 mg daily by mouth. Continuous course   Yes [provider]  budesonide-formoterol (SYMBICORT) 160-4.5 MCG/ACT inhaler Inhale 2 puffs 2 (two) times daily into the lungs.   Yes [provider]  cetirizine (ZYRTEC) 10 MG tablet Take 10 mg daily by mouth.   Yes [provider]  fluticasone (FLONASE) 50 MCG/ACT nasal spray Place 1 spray daily into both nostrils.    Yes [provider]  furosemide (LASIX) 40 MG tablet Take 40 mg daily as needed by mouth for fluid or  edema (ankle swelling).   Yes [provider]  Guaifenesin (MUCINEX MAXIMUM STRENGTH) 1200 MG TB12 Take 1,200 mg daily as needed by mouth (to thin phlegm/cough).   Yes [provider]  HYDROcodone-acetaminophen (NORCO) 10-325 MG tablet Take 1 tablet 3 (three) times daily as needed by mouth (pain).   Yes [provider]  losartan (COZAAR) 100 MG tablet Take 100 mg daily by mouth.  12/04/11  Yes [provider]  montelukast (SINGULAIR) 10 MG tablet Take 10 mg at bedtime by mouth.   Yes [provider]  Morphine-Naltrexone (EMBEDA) 30-1.2 MG CPCR Take 1 capsule 2 (two) times daily by mouth. Embeda   Yes [provider]  naproxen sodium (ALEVE) 220 MG tablet Take 440 mg 2 (two) times daily as needed by mouth (back & leg pain/headache).   Yes [provider]  nystatin-triamcinolone ointment (MYCOLOG) Apply 1 application 2 (two) times daily as needed topically (rash).  04/15/17  Yes [provider]  omeprazole (PRILOSEC) 40 MG capsule Take 40 mg daily by mouth.    Yes [provider]  OXYGEN Inhale 3-4 L continuous into the lungs.   Yes [provider]  predniSONE (DELTASONE) 10 MG tablet Take 10 mg daily by mouth.   Yes [provider]  Tiotropium Bromide Monohydrate (SPIRIVA RESPIMAT) 2.5 MCG/ACT AERS Inhale 1 puff daily into the lungs.   Yes [provider]  Aclidinium Bromide (TUDORZA PRESSAIR) 400 MCG/ACT AEPB Inhale 1 puff into the lungs 2 (two) times daily. Patient not taking: Reported on 07/20/2017 04/29/12   Barbaraann Share, MD  Morphine Sulfate ER [facility]) 30 MG T12A Take 30 mg 2 (two) times daily by mouth.    [provider]   Dg Chest 1 View  Result Date: 07/20/2017 CLINICAL DATA:  Larey Seat this morning and injured left arm. EXAM: CHEST 1 VIEW COMPARISON:  Chest x-ray 03/31/2012 FINDINGS: The cardiac silhouette, mediastinal and hilar contours are within normal limits and  stable. Mild tortuosity of the thoracic aorta. No acute pulmonary findings. No pleural effusion or pneumothorax. No obvious rib fractures. Proximal left humeral shaft fracture is noted. IMPRESSION: No acute cardiopulmonary findings. Electronically Signed   By: Rudie Meyer M.D.   On: 07/20/2017 17:13   Dg Lumbar Spine 2-3 Views  Result Date: 07/22/2017 CLINICAL DATA:  Low back pain after a fall. EXAM: LUMBAR SPINE - 2-3 VIEW COMPARISON:  Lumbar spine MRI 01/11/2017. FINDINGS: Limited study due to osteopenia and substantial overlying bowel gas. Degenerative changes noted at T12-L1 and L1-2, similar to prior MRI. No definite lumbar spine fracture on this two-view study. SI joints are unremarkable. IMPRESSION: Negative. Electronically Signed   By: Kennith Center M.D.   On: 07/22/2017 11:14   Dg Forearm Left  Result Date: 07/20/2017 CLINICAL DATA:  Larey SeatFell today and injured left arm. EXAM: LEFT FOREARM - 2 VIEW COMPARISON:  None. FINDINGS: The wrist and elbow joints are maintained.  No forearm fractures. IMPRESSION: No acute forearm fracture. Electronically Signed   By: Rudie MeyerP.  Gallerani M.D.   On: 07/20/2017 17:07   Dg Shoulder Left  Result Date: 07/20/2017 CLINICAL DATA:  Larey SeatFell this morning and injured left arm. EXAM: LEFT SHOULDER - 2+ VIEW COMPARISON:  None. FINDINGS: Spiral type fracture involving the upper humeral shaft. The humeral head and neck are intact. The [facility] is intact and the glenohumeral joint is intact IMPRESSION: No shoulder fracture or dislocation. Displaced spiral type fracture of the proximal humeral shaft. Electronically Signed   By: Rudie MeyerP.  Gallerani M.D.   On: 07/20/2017 17:06   Dg Humerus Left  Result Date: 07/20/2017 CLINICAL DATA:  Larey SeatFell today and injured left arm. EXAM: LEFT HUMERUS - 2+ VIEW COMPARISON:  None. FINDINGS: There is a displaced spiral type fracture of the proximal humeral shaft with anterior displacement. No involvement of the shoulder or elbow joints. IMPRESSION:  Displaced proximal humeral shaft fracture. Electronically Signed   By: Rudie MeyerP.  Gallerani M.D.   On: 07/20/2017 17:07   Dg Hip Unilat With Pelvis 2-3 Views Left  Result Date: 07/20/2017 CLINICAL DATA:  Larey SeatFell today and injured left hip. EXAM: DG HIP (WITH OR WITHOUT PELVIS) 2-3V LEFT COMPARISON:  None. FINDINGS: Both hips are normally located. Moderate degenerative changes bilaterally, left greater than right. No definite hip fracture. The pubic symphysis and SI joints are intact. No definite pelvic fractures. IMPRESSION: Left hip joint degenerative changes but no acute hip or pelvic fracture. Electronically Signed   By: Rudie MeyerP.  Gallerani M.D.   On: 07/20/2017 17:08    Positive ROS: All other systems have been reviewed and were otherwise negative with the exception of those mentioned in the HPI and as above.  Physical Exam: General: Alert, no acute distress Cardiovascular: No pedal edema Respiratory: No cyanosis, no use of accessory musculature GI: No organomegaly, abdomen is soft and non-tender Skin: No lesions in the area of chief complaint Neurologic: Sensation intact distally Psychiatric: Patient is competent for consent with normal mood and affect Lymphatic: No axillary or cervical lymphadenopathy  MUSCULOSKELETAL:  Left arm exam: She has a sling in place with some ecchymosis noted along the distal brachium.  Otherwise she endorses sensation intact light touch in the axillary nerve, muscular cutaneous nerve, median nerve, ulnar nerve, radial nerve.  She has motor intact as well in the axillary nerve and distally in the above-mentioned nerves and AIN/PIN.  She has a 2+ radial pulse.  She has tenderness along the entire course of the proximal humerus.  Assessment: Closed left proximal humeral shaft fracture.  Plan: -Due to her ongoing lumbar spine complaints we did order and review a lumbar spine x-ray this morning which has been read as negative. -At this time we need to move forward with  surgical fixation of the left humerus fracture.  My plan is for operative intervention tomorrow around lunchtime.  The patient, her husband, and I discussed this at length including the risks, benefits and indications of this procedure.  Those include but are not limited to bleeding, infection, damage to surrounding neurovascular structures, malunion, nonunion, hardware failure, need for future surgery, developing of blood clots, and the risk of anesthesia.  She did provide informed consent. -Postoperatively she will be able to range the shoulder and elbow as well as hand and wrist tolerated, but will be nonweightbearing for  6 weeks. -I have placed an n.p.o. order tonight at midnight.    Yolonda KidaJason Patrick Denaja Verhoeven, MD Cell [phone]    07/22/2017 12:07 PM

## 2017-07-22 NOTE — Progress Notes (Signed)
PT Cancellation Note  Patient Details Name: Nichole RanchDrinda S Mays MRN: [id number] DOB: [birth year]   Cancelled Treatment:    Reason Eval/Treat Not Completed: Other (comment)   Noted plans for surgical fixation tomorrow; Will plan to re-evaluate for PT post-op;   Thank you,  Van ClinesHolly Emali Heyward, PT  Acute Rehabilitation Services Pager [phone] Office [phone]    Levi AlandHolly H Ryler Laskowski 07/22/2017, 2:58 PM

## 2017-07-22 NOTE — Progress Notes (Signed)
Family Medicine Teaching Service Daily Progress Note Intern Pager: [phone]  Patient name: [name] Medical record number: [id number] Date of birth: 1966/02/15 Age: 51 y.o. Gender: female  Primary Care Provider: [name], Michael C, MD Consultants: Orthopedics surgery Code Status: Full  Pt Overview and Major Events to Date:  Nichole RanchDrinda S Kalil is a 51 y.o. female presenting after a ground-level fall resulting in left displaced spiral proximal humeral fracture. PMH is significant for severe COPD on 3-4 L at home, azithromycin and prednisone, morbid obesity, hypertension, chronic pain  11/13 - Planned CLOSED INTRAMEDULLARY (IM) NAIL LEFT HUMERUS  Assessment and Plan: left displaced spiral proximal humeral fracture Occurred after fall. Planned closed iM nail placement. Using schedule 30 norco and 3 mg dilaudid past 24 hrs.  -Continue Norco 10 325 mg 3 times daily as needed; increased to dilaudid 1 mg q2h, flexeril -Orthopedics following; plan to operate today pending medical clearance -Pulmonary consult appreciated for medical clearance, likely difficult to wean -Vitals per floor protocol  Ground-level fall resulting  Fall occurred when patient was off of home O2 while using the restroom. Likely due to oxygen desaturation from severe COPD. Not likely neurologic etiology or siezure given no LOC, postictal state, AxO3, no neurologic deficits. No obvious signs of cardiac abnormality (no CP, negative troponins x2, no change in EKG). However, given significant smoking history, warrants full evaluation by cardiac eval. Echo pending. Orthostatics borderline positive orthostatic w/ 19 SBP change from 103 to 122. Echocardiogram: EF 60% -   65%, no wall motion abnormalities - cardiology recommends event monitor and outpatient follow up at discharge  Chronic pain TakesNorco 10 325 mg 3 times, PO morphine-naltrexone (30-1.2) BID, Morphine Sulfate ER (30 mg) 2 pills BID.  unclear home regimen. Per  med-rec currently transitioning to morphine sulphate from morphine-naltrexone for chronic pain. Will likely need more pain management given recent fracture - continue norco 10-325 mg TID scheduled - dilauidid 1 mg q2h - flexeril  COPD Pt reports breathing is at baseline. Uses 3-4 L of oxygen at home.  Home medications include Symbicort, albuterol, azithromycin daily, Zyrtec, Flonase, Mucinex, Singulair, prednisone 10 mg.  Reports that she takes nebulizers 3-4 times daily at baseline.  No report of increased sputum production fevers or increased shortness of breath.  -Continue 3-4 L oxygen for O2 greater than 90% -Pulmonary consult for medical clearance for surgery -Duonebs q6hrs scheduled, albuterol every 4 hours as needed, Dulera 2 puffs 2 times daily -azithromycin 250mg  daily -prednisone 10mg  daily - pulmonology consult appreciated for surgical clearance due to medical necessity, they anticipate difficult wean  HTN Normotensive upon presentation.  -continue losartan 100mg  daily  Chronic pain Patient reports chronic leg and back pain. Takes norco 10-325mg  TID PRN daily.  - continue norco - morphine 2mg  q4hrs PRN severe pain - bowel regimen w/ miralax and senna  GERD Continue protonix 40mg  daily  FEN/GI:  NPO, famotidine Prophylaxis: SCDs  Disposition: admit to med-surg; ortho following  Subjective:  Pt in right arm is 10/10. Reports her breathing is back to baseline. No other complaints.   Objective: Temp:  [98 F (36.7 C)-99 F (37.2 C)] 98 F (36.7 C) (11/13 0619) Pulse Rate:  [74-110] 74 (11/13 0619) Resp:  [16-18] 18 (11/13 0619) BP: (106-126)/(60-75) 126/73 (11/13 0619) SpO2:  [88 %-94 %] 94 % (11/13 14780619) Physical Exam: General: NAD, resting in bed, obese Cardiovascular: rrr, no mrg Respiratory: satting well on 4L, no increased work of breathing, decreased air movement throughout, no wheezing Abdomen:  soft, nontender, nondistended Extremities: left arm in  sling, no lower extremity edema  Laboratory: Recent Labs  Lab 07/20/17 1547 07/21/17 0609 07/22/17 0544  WBC 21.7* 12.3* 14.2*  HGB 15.1* 14.7 14.6  HCT 45.8 46.4* 45.8  PLT 231 250 246   Recent Labs  Lab 07/20/17 1547 07/21/17 0609 07/22/17 0544  NA 132* 140 140  K 4.5 4.5 3.9  CL 95* 98* 99*  CO2 28 35* 33*  BUN <5* <5* 5*  CREATININE 0.65 0.77 0.70  CALCIUM 8.8* 8.6* 8.9  PROT 6.3* 5.7*  --   BILITOT 0.6 0.6  --   ALKPHOS 75 71  --   ALT 23 21  --   AST 27 21  --   GLUCOSE 107* 80 79      Imaging/Diagnostic Tests: Dg Chest 1 View  Result Date: 07/20/2017 CLINICAL DATA:  Larey SeatFell this morning and injured left arm. EXAM: CHEST 1 VIEW COMPARISON:  Chest x-ray 03/31/2012 FINDINGS: The cardiac silhouette, mediastinal and hilar contours are within normal limits and stable. Mild tortuosity of the thoracic aorta. No acute pulmonary findings. No pleural effusion or pneumothorax. No obvious rib fractures. Proximal left humeral shaft fracture is noted. IMPRESSION: No acute cardiopulmonary findings. Electronically Signed   By: Rudie MeyerP.  Gallerani M.D.   On: 07/20/2017 17:13   Dg Forearm Left  Result Date: 07/20/2017 CLINICAL DATA:  Larey SeatFell today and injured left arm. EXAM: LEFT FOREARM - 2 VIEW COMPARISON:  None. FINDINGS: The wrist and elbow joints are maintained.  No forearm fractures. IMPRESSION: No acute forearm fracture. Electronically Signed   By: Rudie MeyerP.  Gallerani M.D.   On: 07/20/2017 17:07   Dg Shoulder Left  Result Date: 07/20/2017 CLINICAL DATA:  Larey SeatFell this morning and injured left arm. EXAM: LEFT SHOULDER - 2+ VIEW COMPARISON:  None. FINDINGS: Spiral type fracture involving the upper humeral shaft. The humeral head and neck are intact. The [facility] is intact and the glenohumeral joint is intact IMPRESSION: No shoulder fracture or dislocation. Displaced spiral type fracture of the proximal humeral shaft. Electronically Signed   By: Rudie MeyerP.  Gallerani M.D.   On: 07/20/2017 17:06   Dg  Humerus Left  Result Date: 07/20/2017 CLINICAL DATA:  Larey SeatFell today and injured left arm. EXAM: LEFT HUMERUS - 2+ VIEW COMPARISON:  None. FINDINGS: There is a displaced spiral type fracture of the proximal humeral shaft with anterior displacement. No involvement of the shoulder or elbow joints. IMPRESSION: Displaced proximal humeral shaft fracture. Electronically Signed   By: Rudie MeyerP.  Gallerani M.D.   On: 07/20/2017 17:07   Dg Hip Unilat With Pelvis 2-3 Views Left  Result Date: 07/20/2017 CLINICAL DATA:  Larey SeatFell today and injured left hip. EXAM: DG HIP (WITH OR WITHOUT PELVIS) 2-3V LEFT COMPARISON:  None. FINDINGS: Both hips are normally located. Moderate degenerative changes bilaterally, left greater than right. No definite hip fracture. The pubic symphysis and SI joints are intact. No definite pelvic fractures. IMPRESSION: Left hip joint degenerative changes but no acute hip or pelvic fracture. Electronically Signed   By: Rudie MeyerP.  Gallerani M.D.   On: 07/20/2017 17:08    Garnette Gunnerhompson, Aaron B, MD 07/22/2017, 8:24 AM PGY-1, [facility] FPTS Intern pager: [phone], text pages welcome

## 2017-07-22 NOTE — Progress Notes (Signed)
Progress Note  Patient Name: [name] Date of Encounter: 07/22/2017  Primary Cardiologist: n/a  Subjective   Patient with only complaint of left arm pain. She denies dizziness or lightheadedness on standing and walking to restroom during admission. She denies shortness of breath, chest pain, or palpitations.  Inpatient Medications    Scheduled Meds: . azithromycin  250 mg Oral Daily  . fluticasone  1 spray Each Nare Daily  . HYDROcodone-acetaminophen  1 tablet Oral TID  . ipratropium-albuterol  3 mL Nebulization Q6H  . loratadine  10 mg Oral Daily  . losartan  100 mg Oral Daily  . mometasone-formoterol  2 puff Inhalation BID  . montelukast  10 mg Oral QHS  . nicotine  21 mg Transdermal Q24H  . pantoprazole  40 mg Oral Daily  . polyethylene glycol  17 g Oral Daily  . predniSONE  10 mg Oral Daily  . senna  2 tablet Oral Daily   Continuous Infusions:  PRN Meds: albuterol, furosemide, guaiFENesin, HYDROmorphone (DILAUDID) injection   Vital Signs    Vitals:   07/21/17 2013 07/22/17 0213 07/22/17 0619 07/22/17 0920  BP:  112/60 126/73   Pulse: 97 85 74   Resp: 18 18 18    Temp:  98.4 F (36.9 C) 98 F (36.7 C)   TempSrc:  Oral Oral   SpO2: 94% 94% 94% 96%  Weight:      Height:        Intake/Output Summary (Last 24 hours) at 07/22/2017 1211 Last data filed at 07/22/2017 0900 Gross per 24 hour  Intake 942 ml  Output 450 ml  Net 492 ml   Filed Weights   07/20/17 1516  Weight: 210 lb (95.3 kg)    Telemetry    Mild sinus tachycardia - Personally Reviewed  ECG    n/a  Physical Exam   GEN: No acute distress.   Neck: No JVD Cardiac: RRR, no murmurs, rubs, or gallops.  Respiratory: Clear to auscultation bilaterally, no wheezing appreciated. No increased work of breathing; on 4L [place] currently.  GI: Soft, nontender, non-distended  MS: No edema; LUE in sling Neuro:  CN 2-12 grossly intact Psych: Normal affect and mood  Labs    Chemistry Recent  Labs  Lab 07/20/17 1547 07/21/17 0609 07/22/17 0544  NA 132* 140 140  K 4.5 4.5 3.9  CL 95* 98* 99*  CO2 28 35* 33*  GLUCOSE 107* 80 79  BUN <5* <5* 5*  CREATININE 0.65 0.77 0.70  CALCIUM 8.8* 8.6* 8.9  PROT 6.3* 5.7*  --   ALBUMIN 3.9 3.3*  --   AST 27 21  --   ALT 23 21  --   ALKPHOS 75 71  --   BILITOT 0.6 0.6  --   GFRNONAA >60 >60 >60  GFRAA >60 >60 >60  ANIONGAP 9 7 8      Hematology Recent Labs  Lab 07/20/17 1547 07/21/17 0609 07/22/17 0544  WBC 21.7* 12.3* 14.2*  RBC 5.07 4.96 4.88  HGB 15.1* 14.7 14.6  HCT 45.8 46.4* 45.8  MCV 90.3 93.5 93.9  MCH 29.8 29.6 29.9  MCHC 33.0 31.7 31.9  RDW 14.0 14.3 14.4  PLT 231 250 246    Cardiac Enzymes Recent Labs  Lab 07/21/17 0609 07/21/17 1108 07/21/17 1950  TROPONINI <0.03 <0.03 <0.03    Recent Labs  Lab 07/20/17 1605  TROPIPOC 0.01     BNP Recent Labs  Lab 07/20/17 2229  BNP 27.1  DDimer No results for input(s): DDIMER in the last 168 hours.   Radiology    Dg Chest 1 View  Result Date: 07/20/2017 CLINICAL DATA:  Larey SeatFell this morning and injured left arm. EXAM: CHEST 1 VIEW COMPARISON:  Chest x-ray 03/31/2012 FINDINGS: The cardiac silhouette, mediastinal and hilar contours are within normal limits and stable. Mild tortuosity of the thoracic aorta. No acute pulmonary findings. No pleural effusion or pneumothorax. No obvious rib fractures. Proximal left humeral shaft fracture is noted. IMPRESSION: No acute cardiopulmonary findings. Electronically Signed   By: Rudie MeyerP.  Gallerani M.D.   On: 07/20/2017 17:13   Dg Lumbar Spine 2-3 Views  Result Date: 07/22/2017 CLINICAL DATA:  Low back pain after a fall. EXAM: LUMBAR SPINE - 2-3 VIEW COMPARISON:  Lumbar spine MRI 01/11/2017. FINDINGS: Limited study due to osteopenia and substantial overlying bowel gas. Degenerative changes noted at T12-L1 and L1-2, similar to prior MRI. No definite lumbar spine fracture on this two-view study. SI joints are unremarkable.  IMPRESSION: Negative. Electronically Signed   By: Kennith CenterEric  Mansell M.D.   On: 07/22/2017 11:14   Dg Forearm Left  Result Date: 07/20/2017 CLINICAL DATA:  Larey SeatFell today and injured left arm. EXAM: LEFT FOREARM - 2 VIEW COMPARISON:  None. FINDINGS: The wrist and elbow joints are maintained.  No forearm fractures. IMPRESSION: No acute forearm fracture. Electronically Signed   By: Rudie MeyerP.  Gallerani M.D.   On: 07/20/2017 17:07   Dg Shoulder Left  Result Date: 07/20/2017 CLINICAL DATA:  Larey SeatFell this morning and injured left arm. EXAM: LEFT SHOULDER - 2+ VIEW COMPARISON:  None. FINDINGS: Spiral type fracture involving the upper humeral shaft. The humeral head and neck are intact. The [facility] joint is intact and the glenohumeral joint is intact IMPRESSION: No shoulder fracture or dislocation. Displaced spiral type fracture of the proximal humeral shaft. Electronically Signed   By: Rudie MeyerP.  Gallerani M.D.   On: 07/20/2017 17:06   Dg Humerus Left  Result Date: 07/20/2017 CLINICAL DATA:  Larey SeatFell today and injured left arm. EXAM: LEFT HUMERUS - 2+ VIEW COMPARISON:  None. FINDINGS: There is a displaced spiral type fracture of the proximal humeral shaft with anterior displacement. No involvement of the shoulder or elbow joints. IMPRESSION: Displaced proximal humeral shaft fracture. Electronically Signed   By: Rudie MeyerP.  Gallerani M.D.   On: 07/20/2017 17:07   Dg Hip Unilat With Pelvis 2-3 Views Left  Result Date: 07/20/2017 CLINICAL DATA:  Larey SeatFell today and injured left hip. EXAM: DG HIP (WITH OR WITHOUT PELVIS) 2-3V LEFT COMPARISON:  None. FINDINGS: Both hips are normally located. Moderate degenerative changes bilaterally, left greater than right. No definite hip fracture. The pubic symphysis and SI joints are intact. No definite pelvic fractures. IMPRESSION: Left hip joint degenerative changes but no acute hip or pelvic fracture. Electronically Signed   By: Rudie MeyerP.  Gallerani M.D.   On: 07/20/2017 17:08    Cardiac Studies   Echo 07/21/2017: -  Left ventricle: The cavity size was normal. Systolic function was   normal. The estimated ejection fraction was in the range of 60%   to 65%. Wall motion was normal; there were no regional wall   motion abnormalities. Left ventricular diastolic function   parameters were normal. - Aortic valve: Poorly visualized. Trileaflet; normal thickness,   mildly calcified leaflets. There was mild stenosis. Mean gradient   (S): 18 mm Hg. - Pulmonary arteries: Systolic pressure could not be accurately   estimated. - Pericardium, extracardiac: A prominent pericardial fat pad was  present.  Patient Profile     [name] is a 51 y.o. female with a hx of tob use, COPD on home O2 and prednisone, GERD, HTN, OSA not on CPAP, chronic pain, who is being seen today for the evaluation of syncope at the request of Dr Jennette KettleNeal.  Assessment & Plan    Near Syncope: Pt reports lightheadedness prior to near syncopal episode which led her to this hospitalization. Negative workup thus far. She will be set up for event monitor at discharge. Will continue to monitor telemetry while inpatient.   Severe COPD: Pulm following; likelihood for prolonged weaning post op.  Plan for closed IM nail left humerus per ortho for 11/14.  For questions or updates, please contact [facility] Please consult [email or web address] for contact info under Cardiology/STEMI.   Signed, Nyra MarketGorica Svalina, MD  07/22/2017, 12:11 PM    History and all data above reviewed.  Patient examined.  I agree with the findings as above.  The patient exam reveals COR:RRR, no rub, no murmurs  ,  Lungs: Clear  ,  Abd: Positive bowel sounds, no rebound no guarding, Ext No edema  .  All available labs, radiology testing, previous records reviewed. Agree with documented assessment and plan. Questionable near syncope:  No evidence of arrhythmia.  I would keep her on tele through this admission and she would likely not need home monitoring if this is negative.  I  don't strongly suspect any arrhythmia.  We will follow the telemetry results.   Fayrene FearingJames Lavenia Stumpo  1:55 PM  07/22/2017]

## 2017-07-23 ENCOUNTER — Inpatient Hospital Stay (HOSPITAL_COMMUNITY): Payer: BLUE CROSS/BLUE SHIELD | Admitting: Critical Care Medicine

## 2017-07-23 ENCOUNTER — Encounter (HOSPITAL_COMMUNITY)
Admission: EM | Disposition: A | Discharge status: Home Health | Payer: Self-pay | Source: Home / Self Care | Attending: Family Medicine

## 2017-07-23 ENCOUNTER — Encounter (HOSPITAL_COMMUNITY): Payer: Self-pay | Admitting: *Deleted

## 2017-07-23 ENCOUNTER — Inpatient Hospital Stay (HOSPITAL_COMMUNITY): Payer: BLUE CROSS/BLUE SHIELD

## 2017-07-23 DIAGNOSIS — W19XXXD Unspecified fall, subsequent encounter: Secondary | ICD-10-CM

## 2017-07-23 DIAGNOSIS — G894 Chronic pain syndrome: Secondary | ICD-10-CM

## 2017-07-23 HISTORY — PX: HUMERUS IM NAIL: SHX1769

## 2017-07-23 LAB — CBC
HCT: 42.7 % (ref 36.0–46.0)
HEMOGLOBIN: 13.7 g/dL (ref 12.0–15.0)
MCH: 29.7 pg (ref 26.0–34.0)
MCHC: 32.1 g/dL (ref 30.0–36.0)
MCV: 92.6 fL (ref 78.0–100.0)
Platelets: 223 10*3/uL (ref 150–400)
RBC: 4.61 MIL/uL (ref 3.87–5.11)
RDW: 14.2 % (ref 11.5–15.5)
WBC: 13.9 10*3/uL — ABNORMAL HIGH (ref 4.0–10.5)

## 2017-07-23 LAB — BASIC METABOLIC PANEL
Anion gap: 6 (ref 5–15)
BUN: 7 mg/dL (ref 6–20)
CALCIUM: 8.5 mg/dL — AB (ref 8.9–10.3)
CO2: 31 mmol/L (ref 22–32)
CREATININE: 0.63 mg/dL (ref 0.44–1.00)
Chloride: 102 mmol/L (ref 101–111)
Glucose, Bld: 88 mg/dL (ref 65–99)
Potassium: 3.8 mmol/L (ref 3.5–5.1)
SODIUM: 139 mmol/L (ref 135–145)

## 2017-07-23 LAB — SURGICAL PCR SCREEN
MRSA, PCR: NEGATIVE
STAPHYLOCOCCUS AUREUS: NEGATIVE

## 2017-07-23 SURGERY — INSERTION, INTRAMEDULLARY ROD, HUMERUS
Anesthesia: General | Site: Arm Upper | Laterality: Left

## 2017-07-23 MED ORDER — HYDROMORPHONE HCL 1 MG/ML IJ SOLN: 0.2500 mg | INTRAMUSCULAR | Status: DC | PRN

## 2017-07-23 MED ORDER — DEXAMETHASONE SODIUM PHOSPHATE 10 MG/ML IJ SOLN
INTRAMUSCULAR | Status: DC | PRN
  Administered 2017-07-23: 10 mg via INTRAVENOUS

## 2017-07-23 MED ORDER — ONDANSETRON HCL 4 MG/2ML IJ SOLN
INTRAMUSCULAR | Status: DC | PRN
  Administered 2017-07-23: 4 mg via INTRAVENOUS

## 2017-07-23 MED ORDER — METOCLOPRAMIDE HCL 5 MG PO TABS: 5.0000 mg | ORAL_TABLET | Freq: Three times a day (TID) | ORAL | Status: DC | PRN

## 2017-07-23 MED ORDER — FENTANYL CITRATE (PF) 250 MCG/5ML IJ SOLN
INTRAMUSCULAR | Status: DC | PRN
  Administered 2017-07-23 (×2): 50 ug via INTRAVENOUS

## 2017-07-23 MED ORDER — ACETAMINOPHEN 325 MG PO TABS
650.0000 mg | ORAL_TABLET | Freq: Three times a day (TID) | ORAL | Status: DC
  Administered 2017-07-23 – 2017-07-25 (×5): 650 mg via ORAL
  Filled 2017-07-23 (×5): qty 2

## 2017-07-23 MED ORDER — 0.9 % SODIUM CHLORIDE (POUR BTL) OPTIME
TOPICAL | Status: DC | PRN
  Administered 2017-07-23: 1000 mL

## 2017-07-23 MED ORDER — ENOXAPARIN SODIUM 30 MG/0.3ML ~~LOC~~ SOLN
30.0000 mg | SUBCUTANEOUS | Status: DC
  Administered 2017-07-24 – 2017-07-25 (×2): 30 mg via SUBCUTANEOUS
  Filled 2017-07-23 (×2): qty 0.3

## 2017-07-23 MED ORDER — PROPOFOL 10 MG/ML IV BOLUS
INTRAVENOUS | Status: AC
  Filled 2017-07-23: qty 40

## 2017-07-23 MED ORDER — FENTANYL CITRATE (PF) 250 MCG/5ML IJ SOLN
INTRAMUSCULAR | Status: AC
  Filled 2017-07-23: qty 5

## 2017-07-23 MED ORDER — HYDROCORTISONE NA SUCCINATE PF 100 MG IJ SOLR
INTRAMUSCULAR | Status: DC | PRN
  Administered 2017-07-23: 50 mg via INTRAVENOUS

## 2017-07-23 MED ORDER — ACETAMINOPHEN 650 MG RE SUPP: 650.0000 mg | RECTAL | Status: DC | PRN

## 2017-07-23 MED ORDER — BUPIVACAINE-EPINEPHRINE (PF) 0.25% -1:200000 IJ SOLN
INTRAMUSCULAR | Status: AC
  Filled 2017-07-23: qty 30

## 2017-07-23 MED ORDER — LIDOCAINE 2% (20 MG/ML) 5 ML SYRINGE
INTRAMUSCULAR | Status: DC | PRN
  Administered 2017-07-23: 60 mg via INTRAVENOUS

## 2017-07-23 MED ORDER — ALBUTEROL SULFATE (2.5 MG/3ML) 0.083% IN NEBU
INHALATION_SOLUTION | RESPIRATORY_TRACT | Status: AC
  Administered 2017-07-23: 2.5 mg
  Filled 2017-07-23: qty 3

## 2017-07-23 MED ORDER — HYDROCODONE-ACETAMINOPHEN 10-325 MG PO TABS
1.0000 | ORAL_TABLET | Freq: Three times a day (TID) | ORAL | Status: DC | PRN
  Administered 2017-07-23 – 2017-07-25 (×5): 1 via ORAL
  Filled 2017-07-23 (×5): qty 1

## 2017-07-23 MED ORDER — MIDAZOLAM HCL 2 MG/2ML IJ SOLN
INTRAMUSCULAR | Status: AC
  Filled 2017-07-23: qty 2

## 2017-07-23 MED ORDER — ALBUTEROL SULFATE HFA 108 (90 BASE) MCG/ACT IN AERS
INHALATION_SPRAY | RESPIRATORY_TRACT | Status: DC | PRN
  Administered 2017-07-23: 2 via RESPIRATORY_TRACT

## 2017-07-23 MED ORDER — METHOCARBAMOL 1000 MG/10ML IJ SOLN: 500.0000 mg | Freq: Four times a day (QID) | INTRAVENOUS | Status: DC | PRN

## 2017-07-23 MED ORDER — ACETAMINOPHEN 325 MG PO TABS: 650.0000 mg | ORAL_TABLET | ORAL | Status: DC | PRN

## 2017-07-23 MED ORDER — MIDAZOLAM HCL 2 MG/2ML IJ SOLN
INTRAMUSCULAR | Status: AC
  Administered 2017-07-23: 2 mg
  Filled 2017-07-23: qty 2

## 2017-07-23 MED ORDER — ROCURONIUM BROMIDE 10 MG/ML (PF) SYRINGE
PREFILLED_SYRINGE | INTRAVENOUS | Status: DC | PRN
  Administered 2017-07-23: 50 mg via INTRAVENOUS

## 2017-07-23 MED ORDER — LACTATED RINGERS IV SOLN
INTRAVENOUS | Status: DC
  Administered 2017-07-23: 12:00:00 via INTRAVENOUS

## 2017-07-23 MED ORDER — METHOCARBAMOL 500 MG PO TABS
500.0000 mg | ORAL_TABLET | Freq: Four times a day (QID) | ORAL | Status: DC | PRN
  Administered 2017-07-23 – 2017-07-24 (×2): 500 mg via ORAL
  Filled 2017-07-23 (×2): qty 1

## 2017-07-23 MED ORDER — BUPIVACAINE-EPINEPHRINE (PF) 0.5% -1:200000 IJ SOLN
INTRAMUSCULAR | Status: DC | PRN
  Administered 2017-07-23: 30 mL via PERINEURAL

## 2017-07-23 MED ORDER — CEFAZOLIN SODIUM-DEXTROSE 2-4 GM/100ML-% IV SOLN
2.0000 g | Freq: Four times a day (QID) | INTRAVENOUS | Status: AC
  Administered 2017-07-23 – 2017-07-24 (×3): 2 g via INTRAVENOUS
  Filled 2017-07-23 (×3): qty 100

## 2017-07-23 MED ORDER — ACETAMINOPHEN 650 MG RE SUPP: 650.0000 mg | Freq: Three times a day (TID) | RECTAL | Status: DC

## 2017-07-23 MED ORDER — PROPOFOL 10 MG/ML IV BOLUS
INTRAVENOUS | Status: DC | PRN
  Administered 2017-07-23: 150 mg via INTRAVENOUS

## 2017-07-23 MED ORDER — BUPIVACAINE-EPINEPHRINE 0.25% -1:200000 IJ SOLN
INTRAMUSCULAR | Status: DC | PRN
  Administered 2017-07-23: 30 mL

## 2017-07-23 MED ORDER — ONDANSETRON HCL 4 MG PO TABS: 4.0000 mg | ORAL_TABLET | Freq: Four times a day (QID) | ORAL | Status: DC | PRN

## 2017-07-23 MED ORDER — SUGAMMADEX SODIUM 200 MG/2ML IV SOLN
INTRAVENOUS | Status: DC | PRN
  Administered 2017-07-23: 200 mg via INTRAVENOUS

## 2017-07-23 MED ORDER — ONDANSETRON HCL 4 MG/2ML IJ SOLN: 4.0000 mg | Freq: Four times a day (QID) | INTRAMUSCULAR | Status: DC | PRN

## 2017-07-23 MED ORDER — METOCLOPRAMIDE HCL 5 MG/ML IJ SOLN: 5.0000 mg | Freq: Three times a day (TID) | INTRAMUSCULAR | Status: DC | PRN

## 2017-07-23 MED ORDER — PHENYLEPHRINE HCL 10 MG/ML IJ SOLN
INTRAMUSCULAR | Status: DC | PRN
  Administered 2017-07-23: 20 ug/min via INTRAVENOUS

## 2017-07-23 MED ORDER — PROMETHAZINE HCL 25 MG/ML IJ SOLN: 6.2500 mg | INTRAMUSCULAR | Status: DC | PRN

## 2017-07-23 MED ORDER — MIDAZOLAM HCL 5 MG/5ML IJ SOLN
INTRAMUSCULAR | Status: DC | PRN
  Administered 2017-07-23: 1 mg via INTRAVENOUS

## 2017-07-23 MED ORDER — FENTANYL CITRATE (PF) 100 MCG/2ML IJ SOLN
INTRAMUSCULAR | Status: AC
  Administered 2017-07-23: 100 ug
  Filled 2017-07-23: qty 2

## 2017-07-23 MED ORDER — MEPERIDINE HCL 25 MG/ML IJ SOLN: 6.2500 mg | INTRAMUSCULAR | Status: DC | PRN

## 2017-07-23 SURGICAL SUPPLY — 65 items
BIT DRILL 3.8MMX270MM (BIT) ×1
BIT DRILL 3.8X270 (BIT) ×1 IMPLANT
BIT DRILL 5/64X5 DISP (BIT) ×1 IMPLANT
BIT DRILL FLUTED 3.2X145 SHORT (BIT) ×2 IMPLANT
BIT DRILL SHORT 3.2MM (DRILL) IMPLANT
CLOSURE WOUND 1/2 X4 (GAUZE/BANDAGES/DRESSINGS) ×1
COVER SURGICAL LIGHT HANDLE (MISCELLANEOUS) ×3 IMPLANT
DRAPE INCISE IOBAN 66X45 STRL (DRAPES) ×5 IMPLANT
DRAPE ORTHO SPLIT 77X108 STRL (DRAPES) ×6
DRAPE SURG ORHT 6 SPLT 77X108 (DRAPES) ×2 IMPLANT
DRAPE U-SHAPE 47X51 STRL (DRAPES) ×1 IMPLANT
DRILL SHORT 3.2MM (DRILL) ×3
DRSG ADAPTIC 3X8 NADH LF (GAUZE/BANDAGES/DRESSINGS) ×1 IMPLANT
DRSG PAD ABDOMINAL 8X10 ST (GAUZE/BANDAGES/DRESSINGS) ×3 IMPLANT
DURAPREP 26ML APPLICATOR (WOUND CARE) ×3 IMPLANT
ELECT BLADE 4.0 EZ CLEAN MEGAD (MISCELLANEOUS) ×3
ELECT REM PT RETURN 9FT ADLT (ELECTROSURGICAL) ×3
ELECTRODE BLDE 4.0 EZ CLN MEGD (MISCELLANEOUS) ×1 IMPLANT
ELECTRODE REM PT RTRN 9FT ADLT (ELECTROSURGICAL) ×1 IMPLANT
GAUZE SPONGE 4X4 12PLY STRL (GAUZE/BANDAGES/DRESSINGS) ×3 IMPLANT
GAUZE SPONGE 4X4 12PLY STRL LF (GAUZE/BANDAGES/DRESSINGS) ×2 IMPLANT
GAUZE XEROFORM 5X9 LF (GAUZE/BANDAGES/DRESSINGS) ×2 IMPLANT
GLOVE BIO SURGEON STRL SZ7.5 (GLOVE) ×3 IMPLANT
GLOVE BIOGEL PI IND STRL 8 (GLOVE) ×1 IMPLANT
GLOVE BIOGEL PI INDICATOR 8 (GLOVE) ×2
GOWN STRL REUS W/ TWL LRG LVL3 (GOWN DISPOSABLE) ×1 IMPLANT
GOWN STRL REUS W/ TWL XL LVL3 (GOWN DISPOSABLE) ×2 IMPLANT
GOWN STRL REUS W/TWL LRG LVL3 (GOWN DISPOSABLE) ×6
GOWN STRL REUS W/TWL XL LVL3 (GOWN DISPOSABLE) ×3
GUIDEROD SS 2.5MMX280MM (ROD) ×2 IMPLANT
KIT BASIN OR (CUSTOM PROCEDURE TRAY) ×3 IMPLANT
KIT BEACH CHAIR TRIMANO (MISCELLANEOUS) IMPLANT
KIT ROOM TURNOVER OR (KITS) ×3 IMPLANT
MANIFOLD NEPTUNE II (INSTRUMENTS) ×3 IMPLANT
NAIL CANN HUMERAL ML 7X240 LT (Nail) ×2 IMPLANT
NDL 1/2 CIR MAYO (NEEDLE) ×1 IMPLANT
NDL HYPO 25GX1X1/2 BEV (NEEDLE) ×1 IMPLANT
NEEDLE 1/2 CIR MAYO (NEEDLE) IMPLANT
NEEDLE HYPO 25GX1X1/2 BEV (NEEDLE) ×3 IMPLANT
NS IRRIG 1000ML POUR BTL (IV SOLUTION) ×3 IMPLANT
PACK SHOULDER (CUSTOM PROCEDURE TRAY) ×3 IMPLANT
PAD ARMBOARD 7.5X6 YLW CONV (MISCELLANEOUS) ×6 IMPLANT
ROD REAMING 2.5 (Rod) ×2 IMPLANT
SCREW LOCK 4X30 TI (Screw) ×2 IMPLANT
SCREW LOCK MULTILOC FT 4.5X30 (Screw) ×2 IMPLANT
SCREW LOCK THRD TI ML 4.5X34 (Screw) ×4 IMPLANT
SCREW LOCKING 4.0 34MM (Screw) ×2 IMPLANT
SLING ARM FOAM STRAP LRG (SOFTGOODS) IMPLANT
SLING ARM FOAM STRAP MED (SOFTGOODS) IMPLANT
SPONGE LAP 18X18 X RAY DECT (DISPOSABLE) IMPLANT
SPONGE LAP 4X18 X RAY DECT (DISPOSABLE) ×3 IMPLANT
STAPLER VISISTAT 35W (STAPLE) ×2 IMPLANT
STRIP CLOSURE SKIN 1/2X4 (GAUZE/BANDAGES/DRESSINGS) ×2 IMPLANT
SUCTION FRAZIER HANDLE 10FR (MISCELLANEOUS)
SUCTION TUBE FRAZIER 10FR DISP (MISCELLANEOUS) ×1 IMPLANT
SUT FIBERWIRE #2 38 T-5 BLUE (SUTURE) ×3
SUT MNCRL AB 4-0 PS2 18 (SUTURE) ×1 IMPLANT
SUT VIC AB 2-0 CT1 27 (SUTURE) ×18
SUT VIC AB 2-0 CT1 TAPERPNT 27 (SUTURE) ×1 IMPLANT
SUTURE FIBERWR #2 38 T-5 BLUE (SUTURE) ×2 IMPLANT
SYR CONTROL 10ML LL (SYRINGE) ×3 IMPLANT
TOWEL OR 17X24 6PK STRL BLUE (TOWEL DISPOSABLE) ×3 IMPLANT
TOWEL OR 17X26 10 PK STRL BLUE (TOWEL DISPOSABLE) ×3 IMPLANT
WATER STERILE IRR 1000ML POUR (IV SOLUTION) ×3 IMPLANT
YANKAUER SUCT BULB TIP NO VENT (SUCTIONS) ×3 IMPLANT

## 2017-07-23 NOTE — H&P (Signed)
H&P update  The surgical history has been reviewed and remains accurate without interval change.  The patient was re-examined and patient's physiologic condition has not changed significantly in the last 30 days. The condition still exists that makes this procedure necessary. The treatment plan remains the same, without new options for care.  No new pharmacological allergies or types of therapy has been initiated that would change the plan or the appropriateness of the plan.  The patient and/or family understand the potential benefits and risks.  Jason P. Aundria Rudogers, MD 07/23/2017 12:22 PM

## 2017-07-23 NOTE — Anesthesia Procedure Notes (Signed)
Procedure Name: Intubation Date/Time: 07/23/2017 12:49 PM Performed by: Wilburn Cornelia, CRNA Pre-anesthesia Checklist: Patient identified, Emergency Drugs available, Patient being monitored, Suction available and Timeout performed Patient Re-evaluated:Patient Re-evaluated prior to induction Oxygen Delivery Method: Circle system utilized Preoxygenation: Pre-oxygenation with 100% oxygen Induction Type: IV induction and Cricoid Pressure applied Ventilation: Mask ventilation without difficulty Laryngoscope Size: Mac and 3 Grade View: Grade III Tube type: Oral Tube size: 7.0 mm Number of attempts: 1 Airway Equipment and Method: Stylet Placement Confirmation: ETT inserted through vocal cords under direct vision,  positive ETCO2,  CO2 detector and breath sounds checked- equal and bilateral Secured at: 21 cm Tube secured with: Tape Dental Injury: Teeth and Oropharynx as per pre-operative assessment

## 2017-07-23 NOTE — Progress Notes (Signed)
Family Medicine Teaching Service Daily Progress Note Intern Pager: [phone]  Patient name: [name] Medical record number: [id number] Date of birth: May 05, 1966 Age: [age] y.o. Gender: female  Primary Care Provider: [name], Michael C, MD Consultants: Orthopedics surgery Code Status: Full  Pt Overview and Major Events to Date:  [name] is a 51 y.o. female presenting after a ground-level fall resulting in left displaced spiral proximal humeral fracture. PMH is significant for severe COPD on 3-4 L at home, azithromycin and prednisone, morbid obesity, hypertension, chronic pain  11/14 - Planned CLOSED INTRAMEDULLARY (IM) NAIL LEFT HUMERUS  Assessment and Plan: Left displaced spiral proximal humeral fracture Occurred after fall. Planned closed IM nail placement. Using scheduled 30 norco and 8 doses of 1 mg dilaudid past 24 hrs. Pulm consulted for medical clearance, stated likely difficult to wean, consented for prolonged intubation and tracheostomy if needed. D/ced Norco in anticipation of surgery today. - Continue dilaudid 1 mg q2h, flexeril 5mg  TID PRN - Orthopedics following; plan to operate today, then NWB for 6 wks - Vitals per floor protocol  Ground-level fall resulting  Fall occurred when patient was off of home O2 while using the restroom. Likely due to oxygen desaturation from severe COPD. Not likely neurologic etiology or siezure given no LOC, postictal state, AxO3, no neurologic deficits. No obvious signs of cardiac abnormality (no CP, negative troponins x2, no change in EKG). However, given significant smoking history, warrants full evaluation by cardiac eval. Echo with EF 60-65%, no wall motion abnormalities. Orthostatics borderline positive orthostatic w/ 19 SBP change from 103 to 122. Patient reported some lower back pain to ortho yesterday but lumbar xray negative. - may not need outpatient event monitor per cardiology if tele is unrevealing this hospitalization  Chronic  pain TakesNorco 10 325 mg 3 times, PO morphine-naltrexone (30-1.2) BID, Morphine Sulfate ER (30 mg) 2 pills BID. Unclear home regimen. Per med-rec currently transitioning to morphine sulphate from morphine-naltrexone for chronic pain. D/c Norco in anticipation of surgery today. - dilauidid 1 mg q2h - flexeril PRN  COPD Uses 3-4 L of oxygen at home.  Home medications include Symbicort, albuterol, azithromycin daily, Zyrtec, Flonase, Mucinex, Singulair, prednisone 10 mg.  Reports that she takes nebulizers 3-4 times daily at baseline. Sating 92% on home O2. - Continue 3-4 L oxygen for O2 greater than 90% - Duonebs TID scheduled, albuterol every 4 hours as needed, Dulera 2 puffs 2 times daily - azithromycin 250mg  daily (11/2 - ) - prednisone 10mg  daily - pulmonology consult appreciated for surgical clearance due to medical necessity, they anticipate difficult wean and likely prolonged intubation  HTN Normotensive upon presentation.  - continue losartan 100mg  daily  Chronic pain Patient reports chronic leg and back pain. Takes norco 10-325mg  TID PRN daily, d/c in anticipation of surgery.  - diluadid 1mg  q2hrs PRN severe pain - bowel regimen w/ miralax and senna  GERD - Continue protonix 40mg  daily  FEN/GI:  NPO, famotidine Prophylaxis: SCDs  Disposition: surgery today  Subjective:  Pt feels breathing is at baseline. L arm painful to move.  Objective: Temp:  [98.2 F (36.8 C)-98.3 F (36.8 C)] 98.3 F (36.8 C) (11/13 2022) Pulse Rate:  [94-95] 95 (11/13 2022) Resp:  [18] 18 (11/13 2022) BP: (99-100)/(61-68) 100/68 (11/13 2022) SpO2:  [92 %-96 %] 92 % (11/13 2022) Physical Exam: General: NAD, resting in bed, obese Cardiovascular: rrr, no mrg Respiratory: satting well on 3L, no increased work of breathing. Diffuse wheezes.  Abdomen: soft, nontender,  nondistended Extremities: left arm in sling, some bruising noted to L elbow. No lower extremity edema. No decrease in  sensation.  Laboratory: Recent Labs  Lab 07/21/17 0609 07/22/17 0544 07/23/17 0506  WBC 12.3* 14.2* 13.9*  HGB 14.7 14.6 13.7  HCT 46.4* 45.8 42.7  PLT 250 246 223   Recent Labs  Lab 07/20/17 1547 07/21/17 0609 07/22/17 0544 07/23/17 0506  NA 132* 140 140 139  K 4.5 4.5 3.9 3.8  CL 95* 98* 99* 102  CO2 28 35* 33* 31  BUN <5* <5* 5* 7  CREATININE 0.65 0.77 0.70 0.63  CALCIUM 8.8* 8.6* 8.9 8.5*  PROT 6.3* 5.7*  --   --   BILITOT 0.6 0.6  --   --   ALKPHOS 75 71  --   --   ALT 23 21  --   --   AST 27 21  --   --   GLUCOSE 107* 80 79 88    Imaging/Diagnostic Tests: Dg Lumbar Spine 2-3 Views  Result Date: 07/22/2017 CLINICAL DATA:  Low back pain after a fall. EXAM: LUMBAR SPINE - 2-3 VIEW COMPARISON:  Lumbar spine MRI 01/11/2017. FINDINGS: Limited study due to osteopenia and substantial overlying bowel gas. Degenerative changes noted at T12-L1 and L1-2, similar to prior MRI. No definite lumbar spine fracture on this two-view study. SI joints are unremarkable. IMPRESSION: Negative. Electronically Signed   By: Kennith CenterEric  Mansell M.D.   On: 07/22/2017 11:14   Dg Chest 1 View  Result Date: 07/20/2017 CLINICAL DATA:  Larey SeatFell this morning and injured left arm. EXAM: CHEST 1 VIEW COMPARISON:  Chest x-ray 03/31/2012 FINDINGS: The cardiac silhouette, mediastinal and hilar contours are within normal limits and stable. Mild tortuosity of the thoracic aorta. No acute pulmonary findings. No pleural effusion or pneumothorax. No obvious rib fractures. Proximal left humeral shaft fracture is noted. IMPRESSION: No acute cardiopulmonary findings. Electronically Signed   By: Rudie MeyerP.  Gallerani M.D.   On: 07/20/2017 17:13   Dg Forearm Left  Result Date: 07/20/2017 CLINICAL DATA:  Larey SeatFell today and injured left arm. EXAM: LEFT FOREARM - 2 VIEW COMPARISON:  None. FINDINGS: The wrist and elbow joints are maintained.  No forearm fractures. IMPRESSION: No acute forearm fracture. Electronically Signed   By: Rudie MeyerP.   Gallerani M.D.   On: 07/20/2017 17:07   Dg Shoulder Left  Result Date: 07/20/2017 CLINICAL DATA:  Larey SeatFell this morning and injured left arm. EXAM: LEFT SHOULDER - 2+ VIEW COMPARISON:  None. FINDINGS: Spiral type fracture involving the upper humeral shaft. The humeral head and neck are intact. The [facility] joint is intact and the glenohumeral joint is intact IMPRESSION: No shoulder fracture or dislocation. Displaced spiral type fracture of the proximal humeral shaft. Electronically Signed   By: Rudie MeyerP.  Gallerani M.D.   On: 07/20/2017 17:06   Dg Humerus Left  Result Date: 07/20/2017 CLINICAL DATA:  Larey SeatFell today and injured left arm. EXAM: LEFT HUMERUS - 2+ VIEW COMPARISON:  None. FINDINGS: There is a displaced spiral type fracture of the proximal humeral shaft with anterior displacement. No involvement of the shoulder or elbow joints. IMPRESSION: Displaced proximal humeral shaft fracture. Electronically Signed   By: Rudie MeyerP.  Gallerani M.D.   On: 07/20/2017 17:07   Dg Hip Unilat With Pelvis 2-3 Views Left  Result Date: 07/20/2017 CLINICAL DATA:  Larey SeatFell today and injured left hip. EXAM: DG HIP (WITH OR WITHOUT PELVIS) 2-3V LEFT COMPARISON:  None. FINDINGS: Both hips are normally located. Moderate degenerative changes bilaterally, left greater  than right. No definite hip fracture. The pubic symphysis and SI joints are intact. No definite pelvic fractures. IMPRESSION: Left hip joint degenerative changes but no acute hip or pelvic fracture. Electronically Signed   By: Rudie Meyer M.D.   On: 07/20/2017 17:08    Ellwood Dense, DO 07/23/2017, 7:22 AM PGY-1, [facility] Family Medicine FPTS Intern pager: [phone], text pages welcome

## 2017-07-23 NOTE — Anesthesia Preprocedure Evaluation (Addendum)
Anesthesia Evaluation  Patient identified by MRN, date of birth, ID band Patient awake    Reviewed: Allergy & Precautions, NPO status , Patient's Chart, lab work & pertinent test results  Airway Mallampati: III  TM Distance: >3 FB Neck ROM: Full    Dental no notable dental hx. (+) Teeth Intact   Pulmonary shortness of breath, with exertion, at rest and lying, asthma , COPD,  COPD inhaler and oxygen dependent, Current Smoker,    Pulmonary exam normal breath sounds clear to auscultation       Cardiovascular hypertension, Pt. on medications Normal cardiovascular exam Rhythm:Regular Rate:Normal     Neuro/Psych negative neurological ROS  negative psych ROS   GI/Hepatic Neg liver ROS, GERD  Medicated and Controlled,  Endo/Other  Morbid obesity  Renal/GU negative Renal ROS  negative genitourinary   Musculoskeletal Left humerus Fx Chronic low back pain   Abdominal (+) + obese,   Peds  Hematology  (+) anemia ,   Anesthesia Other Findings   Reproductive/Obstetrics                            Anesthesia Physical Anesthesia Plan  ASA: III  Anesthesia Plan: General   Post-op Pain Management:  Regional for Post-op pain   Induction: Intravenous  PONV Risk Score and Plan: 3 and Ondansetron, Dexamethasone, Midazolam, Metaclopromide and Treatment may vary due to age or medical condition  Airway Management Planned: Oral ETT  Additional Equipment:   Intra-op Plan:   Post-operative Plan: Extubation in OR  Informed Consent: I have reviewed the patients History and Physical, chart, labs and discussed the procedure including the risks, benefits and alternatives for the proposed anesthesia with the patient or authorized representative who has indicated his/her understanding and acceptance.   Dental advisory given  Plan Discussed with: CRNA, Anesthesiologist and Surgeon  Anesthesia Plan Comments:          Anesthesia Quick Evaluation

## 2017-07-23 NOTE — Anesthesia Procedure Notes (Signed)
Anesthesia Regional Block: Supraclavicular block   Pre-Anesthetic Checklist: ,, timeout performed, Correct Patient, Correct Site, Correct Laterality, Correct Procedure, Correct Position, site marked, Risks and benefits discussed,  Surgical consent,  Pre-op evaluation,  At surgeon's request and post-op pain management  Laterality: Left  Prep: chloraprep       Needles:  Injection technique: Single-shot  Needle Type: Echogenic Stimulator Needle     Needle Length: 9cm  Needle Gauge: 21   Needle insertion depth: 5 cm   Additional Needles:   Procedures:, nerve stimulator,,, ultrasound used (permanent image in chart),,,,  Narrative:  Start time: 07/23/2017 12:22 PM End time: 07/23/2017 12:28 PM Injection made incrementally with aspirations every 5 mL.  Performed by: Personally  Anesthesiologist: Mal AmabileFoster, Meelah Tallo, MD  Additional Notes: Timeout performed. Patient sedated. Relevant anatomy ID'd using US, Paresthesia elicited with nerve stimulator. Incremental injection 2-605ml of LA with frequent aspiration. Patient tolerated procedure well.

## 2017-07-23 NOTE — Progress Notes (Signed)
FPTS Interim Progress Note  *Late note*  S: Went to bedside to check on patient with Dr. Janee Mornhompson after her surgery today. Patient was sitting up in bed, very alert, cheerful, and excited she made it through her surgery. Pain is controlled. Admits to feeling sweaty but otherwise no complaints.   O: BP 107/69 (BP Location: Right Arm)   Pulse (!) 111   Temp 98.5 F (36.9 C) (Oral)   Resp 18   Ht 5\' 3"  (1.6 m)   Wt 210 lb (95.3 kg)   SpO2 92%   BMI 37.20 kg/m   General: In NAD, sitting up in bed talking  Cardiovascular: Tachycardic rate, regular rhythm, normal s1 and s2 Respiratory: On 4L [place], speaking in full sentences but does appear tachypneic, no wheezing or crackles but poor air movement throughout Abdomen: soft, non-tender,non-distended, +BS Extremities: bandage over left upper arm clean/dry/intact Neuro: A&O x3 Psych: cheerful mood   A/P: S/p L humeral fracture repair surgery Chronic Respiratory Failure from COPD- 3-4L O2 at baseline Patient doing well after surgery. Pain is controlled. Although she is speaking in full sentences she does appear tachypneic and has poor air movement throughout all lung fields. Is on her home 4L [place] with O2 at 92% .  - Continue supplemental O2 - Duoneb treatment now and then q6 PRN - Pain management:Resume home Norco 10 mg TID PRN. Continue Dilaudid 1 mg q2 PRN - Continue to monitor respiratory status closely overnight  Beaulah DinningGambino, Naama Sappington M, MD 07/23/2017, 11:35 PM PGY-3, [facility] pager [phone]

## 2017-07-23 NOTE — Op Note (Signed)
Date of Surgery: 07/23/2017  INDICATIONS: [name] is a 51 y.o.-year-old female with a left spiral fracture of the proximal humeral shaft.  She sustained a ground-level fall last Sunday morning and due to chronic lung and heart issues was not cleared for surgery immediately.  I was available for fixation today and assumed care from my partner Dr. Ranell PatrickNorris.  The patient and I discussed moving forward with operative management to stabilize the humerus and allow for hopefully noneventful healing well decreasing her current pain levels.  We did discuss that she is a bit high risk given her lung issues that precluded her from having an elective spine procedure, however we would recommend intramedullary nail fixation due to less blood loss, and a shorter operative duration, and adequate stabilization for this diaphyseal fracture.;  The patient did consent to the procedure after discussion of the risks and benefits.  PREOPERATIVE DIAGNOSIS: Left humeral shaft fracture, closed.  POSTOPERATIVE DIAGNOSIS: Same.  PROCEDURE: 1.  Intramedullary nail fixation of left humerus shaft fracture.  SURGEON: Maryan RuedJason P Jhamari Markowicz, M.D.  ASSIST: Hart CarwinJustin Queen, RNFA.  ANESTHESIA:  general, and single shot supraclavicular block.  IV FLUIDS AND URINE: See anesthesia.  ESTIMATED BLOOD LOSS: 100 mL.  IMPLANTS: Synthes humeral nail, multi-lock  7 mm x 240 mm 3 proximal interlocking screws 4.8 mm in diameter. 2 distal interlocking screws 4.0 mm diameter  DRAINS: None  Tourniquet: None  COMPLICATIONS: None.  DESCRIPTION OF PROCEDURE: The patient was brought to the operating room and placed supine on the operating radiolucent flat table.  The patient had been signed prior to the procedure and this was documented. The patient had the anesthesia placed by the anesthesiologist.  A time-out was performed to confirm that this was the correct patient, site, side and location. The patient did receive antibiotics prior to the  incision and was re-dosed during the procedure as needed at indicated intervals.  A tourniquet was not placed.     The patient had the operative extremity prepped and draped in the standard surgical fashion.   We began the procedure with exposure to the proximal humerus.  A anterior lateral approach was utilized.  A 4 cm incision was opened off the anterolateral corner of the acromion.  Dissection was carried down from skin through subcutaneous tissue to the superior deltoid fascia.  The raphae was noted between the anterior and mid deltoid muscle.  This was bluntly opened and in line with the deltoid muscle fibers.  The deep deltoid fascia was likewise split in line with the skin incision.  The subdeltoid space was freed bluntly with with my finger of adhesions.  Likewise the subacromial space was freed with blunt dissection.  The supraspinatus muscle was opened in line with fibers at the muscular tendinous junction.  This exposed the humeral head nicely just medial to the tuberosity.  At this point we moved to initiate our starting point.  The starting point for this nail was chosen at the apex of the humeral head on both AP and lateral radiographic views.  After that starting point was confirmed we used the opening reamer to enter the humeral canal.  Following that a guidewire was placed down the humeral shaft.  Using longitudinal traction the fracture was brought into adequate reduction in the guidewire was passed across the fracture site.  The guidewire was noted to be distal at the metaphyseal flare of the humerus on both views.  We then measured for the appropriate length nail.  This measured 240  mm at the tip of the humeral head.  We next used a hand reamer of 8.5 mm to size for the appropriate diameter.  This was an end cutting reamer and did experience appropriate chatter throughout the entire isthmus and across the fracture site.  At this juncture we elected to move forward with a 7 mm diameter final  nail of 240 mm of length.  Utilizing the outrigger handle we passed the nail into the humeral shaft with gentle malleting.  Again AP and lateral radiographs were utilized to confirm the appropriate depth of the nail and rotation.  The fracture reduction was maintained on both views throughout this process.  We next utilized the outrigger handle to place 3 proximal interlocking screws into the humeral head.  Each of these were performed under the same technique utilizing a triple trocar guide which passed through our previous incision and up against the lateral and anterior lateral cortex of the proximal humerus.  We first drilled to the appropriate depth.  We then measured off of the drill guide.  We then placed the screw with hand power.  Each of these 3 screws was placed consecutively and had good bite.  We next turned our attention to the distal interlocking screws.  We placed the arm on the Mayo stand and moved to the fluoroscopy machine into place to obtain a perfect circle radiography.  Once the perfect circles were obtained we magnified that x2.  We next made a small anterior skin incision overlying the distal interlocking screw holes.  We then placed 2 distal interlocking screws in the same fashion.  After perfect circles were confirmed again we drilled through the near and far cortex and through the nail again confirming this on AP and lateral radiography.  We did this consecutively and measured with the sleeve measuring device down to the anterior cortex.  We then placed 2 distal interlocking screws with hand power that both had good purchase in length.  Final AP and lateral radiographs were taken at the proximal and distal aspect of the humerus.  These demonstrated adequate fixation with the intramedullary nail in adequate reduction.  We next moved to closure.  We copiously irrigated the proximal and distal wounds with normal saline.  We then closed to the supraspinatus split with a #2 FiberWire  with 2 interrupted figure-of-eight stitches.  We next closed the deep deltoid fascia with buried inverted figure-of-eight 0 Vicryl.  The subcutaneous tissues were closed with 2-0 Vicryl at the proximal and distal incisions.  And the skin was closed with staples.  The arm was cleaned and dried one final time and a sterile dressing was applied.   All counts were correct x2.  There were no immediate complications.  The patient was awakened from general anesthetic without complications.  She was transferred to PACU in stable condition.   POSTOPERATIVE PLAN: Ashby DawesDrinda S Morrison will remain in her sling as needed but may remove that as tolerated.  She will be nonweightbearing to left upper extremity except for activities of daily living.  She can do elbow shoulder hand and wrist range of motion as tolerated.  She will be on aspirin for DVT prophylaxis for 6 weeks.  She will return to the medicine floor for continuation of her inpatient care.  Her postoperative dressings can remain in place until her postoperative follow-up appointment.

## 2017-07-23 NOTE — Brief Op Note (Signed)
07/23/2017  2:51 PM  PATIENT:  [name]  51 y.o. female  PRE-OPERATIVE DIAGNOSIS:  LEFT HUMERUS FRACTURE  POST-OPERATIVE DIAGNOSIS:  LEFT HUMERUS FRACTURE  PROCEDURE:  Procedure(s): INTRAMEDULLARY (IM) NAIL HUMERAL (Left)  SURGEON:  Surgeon(s) and Role:    * Yolonda Kidaogers, Katai Marsico Patrick, MD - Primary  PHYSICIAN ASSISTANT:   ASSISTANTS: Hart CarwinJustin Queen, RNFA   ANESTHESIA:   regional and general  EBL:  200 mL   BLOOD ADMINISTERED:none  DRAINS: none   LOCAL MEDICATIONS USED:  NONE  SPECIMEN:  No Specimen  DISPOSITION OF SPECIMEN:  N/A  COUNTS:  YES  TOURNIQUET:  * No tourniquets in log *  DICTATION: .Note written in EPIC  PLAN OF CARE: Admit to inpatient   PATIENT DISPOSITION:  PACU - hemodynamically stable.   Delay start of Pharmacological VTE agent (>24hrs) due to surgical blood loss or risk of bleeding: not applicable

## 2017-07-23 NOTE — Transfer of Care (Signed)
Immediate Anesthesia Transfer of Care Note  Patient: [name]  Procedure(s) Performed: INTRAMEDULLARY (IM) NAIL HUMERAL (Left Arm Upper)  Patient Location: PACU  Anesthesia Type:GA combined with regional for post-op pain  Level of Consciousness: awake and alert   Airway & Oxygen Therapy: Patient Spontanous Breathing and Patient connected to face mask oxygen  Post-op Assessment: Report given to RN and Post -op Vital signs reviewed and stable  Post vital signs: Reviewed and stable  Last Vitals:  Vitals:   07/23/17 1230 07/23/17 1235  BP: 124/69 (!) 110/50  Pulse: (!) 112 (!) 125  Resp: (!) 24 (!) 29  Temp:    SpO2: (!) 88% (!) 67%   HR 120, BP 115/72, RR 18, sats 100% Last Pain:  Vitals:   07/23/17 1235  TempSrc:   PainSc: 0-No pain      Patients Stated Pain Goal: 1 (07/23/17 0815)  Complications: No apparent anesthesia complications

## 2017-07-23 NOTE — Anesthesia Postprocedure Evaluation (Signed)
Anesthesia Post Note  Patient: [name]  Procedure(s) Performed: INTRAMEDULLARY (IM) NAIL HUMERAL (Left Arm Upper)     Patient location during evaluation: PACU Anesthesia Type: General Level of consciousness: sedated Pain management: pain level controlled Vital Signs Assessment: post-procedure vital signs reviewed and stable Respiratory status: spontaneous breathing and respiratory function stable Cardiovascular status: stable Postop Assessment: no apparent nausea or vomiting Anesthetic complications: no                 Bettejane Leavens DANIEL

## 2017-07-24 DIAGNOSIS — S42332K Displaced oblique fracture of shaft of humerus, left arm, subsequent encounter for fracture with nonunion: Secondary | ICD-10-CM

## 2017-07-24 DIAGNOSIS — Z419 Encounter for procedure for purposes other than remedying health state, unspecified: Secondary | ICD-10-CM

## 2017-07-24 LAB — CBC
HCT: 38.4 % (ref 36.0–46.0)
HEMOGLOBIN: 12.6 g/dL (ref 12.0–15.0)
MCH: 29.7 pg (ref 26.0–34.0)
MCHC: 32.8 g/dL (ref 30.0–36.0)
MCV: 90.6 fL (ref 78.0–100.0)
Platelets: 244 10*3/uL (ref 150–400)
RBC: 4.24 MIL/uL (ref 3.87–5.11)
RDW: 13.5 % (ref 11.5–15.5)
WBC: 17.5 10*3/uL — AB (ref 4.0–10.5)

## 2017-07-24 LAB — BASIC METABOLIC PANEL
Anion gap: 9 (ref 5–15)
BUN: 8 mg/dL (ref 6–20)
CHLORIDE: 97 mmol/L — AB (ref 101–111)
CO2: 30 mmol/L (ref 22–32)
Calcium: 8.3 mg/dL — ABNORMAL LOW (ref 8.9–10.3)
Creatinine, Ser: 0.61 mg/dL (ref 0.44–1.00)
GFR calc Af Amer: >60 mL/min (ref >60)
GFR calc non Af Amer: >60 mL/min (ref >60)
GLUCOSE: 120 mg/dL — AB (ref 65–99)
POTASSIUM: 4 mmol/L (ref 3.5–5.1)
Sodium: 136 mmol/L (ref 135–145)

## 2017-07-24 NOTE — Progress Notes (Signed)
   Subjective:  Patient reports pain as mild.  Well controlled with oral meds.  Denies CP/N/V, her SOB is at baseline and with exertion.  Objective:   VITALS:   Vitals:   07/24/17 1341 07/24/17 1450 07/24/17 1958 07/24/17 2001  BP: 99/60     Pulse: 94     Resp: 16     Temp: 98.3 F (36.8 C)     TempSrc: Oral     SpO2: 92% 93% 95% 94%  Weight:      Height:        Intact pulses distally Incision: dressing C/D/I No cellulitis present Compartment soft Distally motor is intact ax/MC/ain/pin/med/rad/uln SILT rad/uln/med/ax, however some paresthesias in ulnar and rad nerve 2+rad pulse  Lab Results  Component Value Date   WBC 17.5 (H) 07/24/2017   HGB 12.6 07/24/2017   HCT 38.4 07/24/2017   MCV 90.6 07/24/2017   PLT 244 07/24/2017   BMET    Component Value Date/Time   NA 136 07/24/2017 0640   K 4.0 07/24/2017 0640   CL 97 (L) 07/24/2017 0640   CO2 30 07/24/2017 0640   GLUCOSE 120 (H) 07/24/2017 0640   BUN 8 07/24/2017 0640   CREATININE 0.61 07/24/2017 0640   CALCIUM 8.3 (L) 07/24/2017 0640   GFRNONAA >60 07/24/2017 0640   GFRAA >60 07/24/2017 0640     Assessment/Plan: 1 Day Post-Op   Active Problems:   COPD exacerbation (HCC)   Closed displaced oblique fracture of shaft of left humerus   Fall   Near syncope   Acute pain of left shoulder   Back pain   Surgery, elective  - NWB to LUE - sling prn for comfort, and ok for full AROM and PROM at the elbow, wrist, shoulder and hand on the left - I suspect her paresthesias are related to the block and concomitant cervical radicular dx, and should resolve with time - maintain post op dressing until follow up appointment - asa daily for DVT ppx per Ortho for 6 weeks.   Yolonda KidaJason Patrick Jamille Fisher 07/24/2017, 8:56 PM   Maryan RuedJason P Kellin Bartling, MD [phone]

## 2017-07-24 NOTE — Discharge Instructions (Signed)
Family Medicine Instructions: - Can use dilaudid tablets only as needed for pain. 7 day duration given.  - Continue to use home oxygen and COPD meds as before. - Follow up with your primary doctor within a week.  Ortho Instructions: - Maintain post op bandages until follow up appointment, unless they become saturated and then remove and replace with a clean and dry dressing. - no weight bearing to the left arm, ok to use arm for activities of daily living - wear sling to left - ok to move the shoulder and elbow as tolerated  - return to see surgeon in 2 weeks

## 2017-07-24 NOTE — Progress Notes (Signed)
Re-Evaluation Patient Details Name: Nichole RanchDrinda S Morrison MRN: [id number] DOB: 03-08-1966 Today's Date: 07/24/2017    History of Present Illness 51 y.o. female admitted on 07/20/17 for fall with left displaced proximal humeral shaft fx s/p ORIF on 07/23/17 and is NWB in sling post op. Pt with significant PMH of SOB on 2-4 L O2 [place] at home at baseline, HTN, COPD, lumbar laminectomy and decompression, and neck surgery.    PT Comments    Pt is POD #1 from L shoulder ORIF and is doing well, supervision overall and demonstrated the ability to do stairs simulating home entry with supervision as well.  She is a bit shaky on her feet, but really has not walked much during her admission and I think she will be fine to return home with her husband's supervision. PT will follow acutely, but I do not believe she needs f/u therapy (yet) at discharge.  She may eventually need OP for her L shoulder, but we will leave that up to her physician f/u visits.    Follow Up Recommendations  No PT follow up;Supervision for mobility/OOB     Equipment Recommendations  None recommended by PT    Recommendations for Other Services   NA     Precautions / Restrictions Precautions Precautions: Shoulder Required Braces or Orthoses: Sling Restrictions Weight Bearing Restrictions: Yes LUE Weight Bearing: Non weight bearing    Mobility  Bed Mobility Overal bed mobility: Needs Assistance Bed Mobility: Supine to Sit     Supine to sit: Supervision Sit to supine: HOB elevated   General bed mobility comments: HOB maximally elevated and pt using railing for leverage, pt came out of the right side of the bed to avoid left shoulder. she normally sleeps in a recliner chair at home due to her breathing issues.   Transfers Overall transfer level: Needs assistance Equipment used: None Transfers: Sit to/from Stand Sit to Stand: Supervision         General transfer comment: supervision for safety pt using bed rail and BSC  handle for transitions up and down.   Ambulation/Gait Ambulation/Gait assistance: Supervision Ambulation Distance (Feet): 25 Feet Assistive device: None Gait Pattern/deviations: Step-through pattern     General Gait Details: slow guarded gait pattern, reaching, when she can for support from her free, right hand.  Generally steady, just being cautious. Increased DOE to 3/4 with gait to the bathroom and back to the bed (short distance) with 4 L O2.  Pt report she normally gets this winded with activity.    Stairs Stairs: Yes   Stair Management: One rail Right;Step to pattern;Forwards Number of Stairs: 5 General stair comments: supervision for safety. Pt reports PTA she did one step at a time.          Balance Overall balance assessment: Needs assistance Sitting-balance support: Feet supported;No upper extremity supported Sitting balance-Leahy Scale: Good     Standing balance support: Single extremity supported Standing balance-Leahy Scale: Good                              Cognition Arousal/Alertness: Awake/alert Behavior During Therapy: WFL for tasks assessed/performed Overall Cognitive Status: Within Functional Limits for tasks assessed  Pertinent Vitals/Pain Pain Assessment: 0-10 Pain Score: 8  Pain Location: L UE Pain Descriptors / Indicators: Aching;Sore Pain Intervention(s): Limited activity within patient's tolerance;Monitored during session;Repositioned;RN gave pain meds during session;Ice applied           PT Goals (current goals can now be found in the care plan section) Acute Rehab PT Goals Patient Stated Goal: to go home today or tomorrow. PT Goal Formulation: With patient/family Time For Goal Achievement: 07/31/17 Potential to Achieve Goals: Good Progress towards PT goals: Progressing toward goals    Frequency    Min 5X/week      PT Plan Current plan remains  appropriate       AM-PAC PT "6 Clicks" Daily Activity  Outcome Measure  Difficulty turning over in bed (including adjusting bedclothes, sheets and blankets)?: A Little Difficulty moving from lying on back to sitting on the side of the bed? : A Little Difficulty sitting down on and standing up from a chair with arms (e.g., wheelchair, bedside commode, etc,.)?: None Help needed moving to and from a bed to chair (including a wheelchair)?: None Help needed walking in hospital room?: None Help needed climbing 3-5 steps with a railing? : None 6 Click Score: 22    End of Session Equipment Utilized During Treatment: Other (comment);Oxygen(L arm sling, 4 L O2 ) Activity Tolerance: Patient limited by pain Patient left: in chair;with call bell/phone within reach;with family/visitor present   PT Visit Diagnosis: History of falling (Z91.81);Pain Pain - Right/Left: Left Pain - part of body: Arm     Time: [id number] PT Time Calculation (min) (ACUTE ONLY): 35 min  Charges:  $Gait Training: 8-22 mins          Jacklynn Dehaas B. Natividad Halls, PT, DPT [phone]            07/24/2017, 12:34 PM

## 2017-07-24 NOTE — Progress Notes (Signed)
Family Medicine Teaching Service Daily Progress Note Intern Pager: [phone]  Patient name: Nichole RanchDrinda S Taketa Medical record number: [id number] Date of birth: Dec 09, 1965 Age: 51 y.o. Gender: female  Primary Care Provider: [name], Michael C, MD Consultants: Orthopedics surgery Code Status: Full  Pt Overview and Major Events to Date:  11/11 - admitted for presyncope, L displaced proximal humeral fracture 11/14 - closed IM nail fixation of L humeral fracture  Assessment and Plan: [name] is a 51 y.o. female presenting after a ground-level fall resulting in left displaced spiral proximal humeral fracture. PMH is significant for severe COPD on 3-4 L at home, azithromycin and prednisone, morbid obesity, hypertension, chronic pain.  Left displaced spiral proximal humeral fracture Occurred after ground level fall. S/p closed IM nail placement 11/14 with no significant complications. Cardiac workup unrevealing. Cardiology states may not need outpatient monitor on discharge if tele is unrevealing. Using scheduled tylenol, 2 doses PRN Norco, and 5 doses of 1 mg dilaudid since surgery.  - Continue dilaudid 1 mg q2h, Norco TID PRN, flexeril 5mg  TID PRN - Orthopedics following, NWB for 6 wks - remain on ASA for prophylaxis for 6 wks - Vitals per floor protocol  Chronic pain TakesNorco 10 325 mg TID, PO morphine-naltrexone (30-1.2) BID, Morphine Sulfate ER (30 mg) 2 pills BID at home. Per med-rec currently transitioning to morphine sulphate from morphine-naltrexone for chronic pain. Feels pain is well controlled on current regimen listed below. - scheduled tylenol TID - robaxin 500mg  q6h PRN - 1 dose last 24 hrs - flexeril 5mg  TID PRN - 1 dose last 24 hrs - norco TID PRN - 1 dose last 24 hrs - dilaudid 1 mg q2h PRN - 4 doses last 24 hrs  COPD Uses 3-4 L of oxygen at home.  Home medications include Symbicort, albuterol, azithromycin daily, Zyrtec, Flonase, Mucinex, Singulair, prednisone 10 mg.   Reports that she takes nebulizers 3-4 times daily at baseline. Had some desats to 67-88% yesterday afternoon after surgery, subsequently received breathing treatments. Currently receiving scheduled duoneb. - Continue 3-4 L oxygen for O2 greater than 90% - Duonebs TID scheduled, albuterol every 4 hours PRN, Dulera 2 puffs 2 times daily, singulair 10mg  daily, prednisone 10mg  daily - azithromycin 250mg  daily (11/12 - ) - pulmonology following  FEN/GI:  NPO, famotidine Prophylaxis: SCDs  Disposition: pending PT/OT eval, pain control, breathing at baseline  Subjective:  Pt feels breathing is at baseline, endorsing dry cough. Feels pain is well controlled on current regimen.  Objective: Temp:  [97 F (36.1 C)-98.8 F (37.1 C)] 98.3 F (36.8 C) (11/15 0407) Pulse Rate:  [91-125] 91 (11/15 0440) Resp:  [17-29] 18 (11/15 0440) BP: (105-130)/(50-77) 105/65 (11/15 0407) SpO2:  [67 %-97 %] 93 % (11/15 0838) Weight:  [210 lb (95.3 kg)] 210 lb (95.3 kg) (11/14 1159)  Physical Exam: General: NAD, resting in bed, obese Cardiovascular: RRR no mrg Respiratory: currently receiving scheduled duoneb, no increased work of breathing, occasional dry cough. Diffuse end expiratory wheezes.  Abdomen: soft, nontender, nondistended. Extremities: left arm in sling, some bruising noted to L elbow. No lower extremity edema.   Laboratory: Recent Labs  Lab 07/22/17 0544 07/23/17 0506 07/24/17 0640  WBC 14.2* 13.9* 17.5*  HGB 14.6 13.7 12.6  HCT 45.8 42.7 38.4  PLT 246 223 244   Recent Labs  Lab 07/20/17 1547 07/21/17 0609 07/22/17 0544 07/23/17 0506 07/24/17 0640  NA 132* 140 140 139 136  K 4.5 4.5 3.9 3.8 4.0  CL 95*  98* 99* 102 97*  CO2 28 35* 33* 31 30  BUN <5* <5* 5* 7 8  CREATININE 0.65 0.77 0.70 0.63 0.61  CALCIUM 8.8* 8.6* 8.9 8.5* 8.3*  PROT 6.3* 5.7*  --   --   --   BILITOT 0.6 0.6  --   --   --   ALKPHOS 75 71  --   --   --   ALT 23 21  --   --   --   AST 27 21  --   --   --    GLUCOSE 107* 80 79 88 120*    Imaging/Diagnostic Tests: Dg Humerus Left  Result Date: 07/23/2017 CLINICAL DATA:  Status post surgical internal fixation of left humeral shaft fracture. EXAM: LEFT HUMERUS - 2+ VIEW COMPARISON:  Radiographs of July 20, 2017. FINDINGS: Status post intramedullary rod fixation of oblique fracture involving proximal left humeral shaft. Good alignment of fracture components is noted. Surgical staples are noted. IMPRESSION: Status post intramedullary rod fixation of proximal left humeral shaft fracture. Electronically Signed   By: Lupita RaiderJames  Green Jr, M.D.   On: 07/23/2017 16:35   Dg Humerus Left  Result Date: 07/23/2017 CLINICAL DATA:  ORIF left humerus EXAM: DG C-ARM 61-120 MIN; LEFT HUMERUS - 2+ VIEW COMPARISON:  None FLUOROSCOPY TIME:  1 minutes 16 seconds FINDINGS: Oblique fracture of the proximal left humeral diaphysis transfixed with a intramedullary nail with multiple interlocking screws without failure or complication. Fracture is in near anatomic alignment. IMPRESSION: Interval left humeral fracture ORIF. Electronically Signed   By: Elige KoHetal  Patel   On: 07/23/2017 15:49   Dg C-arm 61-120 Min  Result Date: 07/23/2017 CLINICAL DATA:  ORIF left humerus EXAM: DG C-ARM 61-120 MIN; LEFT HUMERUS - 2+ VIEW COMPARISON:  None FLUOROSCOPY TIME:  1 minutes 16 seconds FINDINGS: Oblique fracture of the proximal left humeral diaphysis transfixed with a intramedullary nail with multiple interlocking screws without failure or complication. Fracture is in near anatomic alignment. IMPRESSION: Interval left humeral fracture ORIF. Electronically Signed   By: Elige KoHetal  Patel   On: 07/23/2017 15:49   Dg Chest 1 View  Result Date: 07/20/2017 CLINICAL DATA:  Larey SeatFell this morning and injured left arm. EXAM: CHEST 1 VIEW COMPARISON:  Chest x-ray 03/31/2012 FINDINGS: The cardiac silhouette, mediastinal and hilar contours are within normal limits and stable. Mild tortuosity of the thoracic  aorta. No acute pulmonary findings. No pleural effusion or pneumothorax. No obvious rib fractures. Proximal left humeral shaft fracture is noted. IMPRESSION: No acute cardiopulmonary findings. Electronically Signed   By: Rudie MeyerP.  Gallerani M.D.   On: 07/20/2017 17:13   Dg Forearm Left  Result Date: 07/20/2017 CLINICAL DATA:  Larey SeatFell today and injured left arm. EXAM: LEFT FOREARM - 2 VIEW COMPARISON:  None. FINDINGS: The wrist and elbow joints are maintained.  No forearm fractures. IMPRESSION: No acute forearm fracture. Electronically Signed   By: Rudie MeyerP.  Gallerani M.D.   On: 07/20/2017 17:07   Dg Shoulder Left  Result Date: 07/20/2017 CLINICAL DATA:  Larey SeatFell this morning and injured left arm. EXAM: LEFT SHOULDER - 2+ VIEW COMPARISON:  None. FINDINGS: Spiral type fracture involving the upper humeral shaft. The humeral head and neck are intact. The [facility] joint is intact and the glenohumeral joint is intact IMPRESSION: No shoulder fracture or dislocation. Displaced spiral type fracture of the proximal humeral shaft. Electronically Signed   By: Rudie MeyerP.  Gallerani M.D.   On: 07/20/2017 17:06   Dg Humerus Left  Result Date: 07/20/2017  CLINICAL DATA:  Larey Seat today and injured left arm. EXAM: LEFT HUMERUS - 2+ VIEW COMPARISON:  None. FINDINGS: There is a displaced spiral type fracture of the proximal humeral shaft with anterior displacement. No involvement of the shoulder or elbow joints. IMPRESSION: Displaced proximal humeral shaft fracture. Electronically Signed   By: Rudie Meyer M.D.   On: 07/20/2017 17:07   Dg Hip Unilat With Pelvis 2-3 Views Left  Result Date: 07/20/2017 CLINICAL DATA:  Larey Seat today and injured left hip. EXAM: DG HIP (WITH OR WITHOUT PELVIS) 2-3V LEFT COMPARISON:  None. FINDINGS: Both hips are normally located. Moderate degenerative changes bilaterally, left greater than right. No definite hip fracture. The pubic symphysis and SI joints are intact. No definite pelvic fractures. IMPRESSION: Left hip joint  degenerative changes but no acute hip or pelvic fracture. Electronically Signed   By: Rudie Meyer M.D.   On: 07/20/2017 17:08    Ellwood Dense, DO 07/24/2017, 9:47 AM PGY-1, [facility] FPTS Intern pager: [phone], text pages welcome

## 2017-07-24 NOTE — Progress Notes (Signed)
Occupational Therapy Treatment Patient Details Name: [name] MRN: [id number] DOB: 09/24/1965 Today's Date: 07/24/2017    History of present illness 51 y.o. female admitted on 07/20/17 for fall with left displaced proximal humeral shaft fx s/p ORIF on 07/23/17 and is NWB in sling post op. Pt with significant PMH of SOB on 2-4 L O2 [place] at home at baseline, HTN, COPD, lumbar laminectomy and decompression, and neck surgery.   OT comments  Pt is now s/p ORIF LUE. Began education on safety/compensatory techniques for completing UB ADLs with Pt and Pt's spouse. Pt currently requires MaxA for UB ADLs secondary to LUE functional limitations, reports needing assist for LB ADLs at baseline which spouse is able to provide. Pt will benefit from continued OT services while in acute setting to maximize pt's safety and independence with ADLs and mobility prior to return home.    Follow Up Recommendations  DC plan and follow up therapy as arranged by surgeon;Supervision - Intermittent    Equipment Recommendations  None recommended by OT          Precautions / Restrictions Precautions Precautions: Shoulder Required Braces or Orthoses: Sling Restrictions Weight Bearing Restrictions: Yes LUE Weight Bearing: Non weight bearing       Mobility Bed Mobility Overal bed mobility: Needs Assistance Bed Mobility: Supine to Sit     Supine to sit: Supervision Sit to supine: HOB elevated   General bed mobility comments: OOB upon arrival   Transfers Overall transfer level: Needs assistance Equipment used: None Transfers: Sit to/from Stand Sit to Stand: Supervision         General transfer comment: supervision for safety pt using bed rail and BSC handle for transitions up and down.     Balance Overall balance assessment: Needs assistance Sitting-balance support: Feet supported;No upper extremity supported Sitting balance-Leahy Scale: Good     Standing balance support: Single extremity  supported Standing balance-Leahy Scale: Good                             ADL either performed or assessed with clinical judgement   ADL Overall ADL's : Needs assistance/impaired Eating/Feeding: Set up;Sitting   Grooming: Set up;Sitting   Upper Body Bathing: Minimal assistance;Sitting Upper Body Bathing Details (indicate cue type and reason): educated on technique for bathing under LUE      Upper Body Dressing : Maximal assistance;Sitting Upper Body Dressing Details (indicate cue type and reason): educated on dressing techniques; maxA to doff/don slight with spouse present and observing  Lower Body Dressing: Moderate assistance;Sit to/from Water engineerstand             Tub/Shower Transfer Details (indicate cue type and reason): educated on completing bathing in sitting and with spouse assist for increased safety/energy conservation    General ADL Comments: educated/reviewed compensatory techniques for completing ADLs with spouse present for education as well; will benefit from additional practice/review prior to d/c tomorrow; education provided on edema reduction techniques                        Cognition Arousal/Alertness: Awake/alert Behavior During Therapy: WFL for tasks assessed/performed Overall Cognitive Status: Within Functional Limits for tasks assessed                                          Exercises Shoulder  Exercises Elbow Flexion: AAROM;5 reps;Left;Seated Elbow Extension: AAROM;5 reps;Seated Wrist Flexion: AROM;Left;5 reps;Seated Wrist Extension: AROM;Left;5 reps;Seated Digit Composite Flexion: AROM;Left;Seated;10 reps Composite Extension: AROM;10 reps;Left Hand Exercises Forearm Supination: AAROM;5 reps;Seated;Left Forearm Pronation: AAROM;5 reps;Left;Seated                Pertinent Vitals/ Pain       Pain Assessment: Faces Pain Score: 8  Faces Pain Scale: Hurts even more Pain Location: L UE Pain Descriptors /  Indicators: Aching;Sore Pain Intervention(s): Limited activity within patient's tolerance;Monitored during session;Repositioned;Ice applied                                                          Frequency  Min 2X/week        Progress Toward Goals  OT Goals(current goals can now be found in the care plan section)  Progress towards OT goals: Progressing toward goals  Acute Rehab OT Goals Patient Stated Goal: to go home today or tomorrow. OT Goal Formulation: With patient/family Time For Goal Achievement: 07/29/17 Potential to Achieve Goals: Good  Plan Discharge plan remains appropriate                     AM-PAC PT "6 Clicks" Daily Activity     Outcome Measure   Help from another person eating meals?: None Help from another person taking care of personal grooming?: A Little Help from another person toileting, which includes using toliet, bedpan, or urinal?: A Little Help from another person bathing (including washing, rinsing, drying)?: A Lot Help from another person to put on and taking off regular upper body clothing?: A Lot Help from another person to put on and taking off regular lower body clothing?: A Lot 6 Click Score: 16    End of Session Equipment Utilized During Treatment: Other (comment)(sling )  OT Visit Diagnosis: Muscle weakness (generalized) (M62.81);Pain;History of falling (Z91.81) Pain - Right/Left: Left Pain - part of body: Shoulder   Activity Tolerance Patient tolerated treatment well   Patient Left in chair;with call bell/phone within reach;with family/visitor present   Nurse Communication Mobility status        Time: [id number] OT Time Calculation (min): 24 min  Charges: OT General Charges $OT Visit: 1 Visit OT Treatments $Self Care/Home Management : 8-22 mins  Marcy SirenBreanna Bryttany Tortorelli, OT Pager [phone] 07/24/2017    Orlando PennerBreanna L Jett Kulzer 07/24/2017, 2:07 PM

## 2017-07-25 ENCOUNTER — Encounter (HOSPITAL_COMMUNITY): Payer: Self-pay | Admitting: Orthopedic Surgery

## 2017-07-25 DIAGNOSIS — E6609 Other obesity due to excess calories: Secondary | ICD-10-CM

## 2017-07-25 DIAGNOSIS — S42202A Unspecified fracture of upper end of left humerus, initial encounter for closed fracture: Secondary | ICD-10-CM

## 2017-07-25 MED ORDER — HYDROCODONE-ACETAMINOPHEN 10-325 MG PO TABS: 1.0000 | ORAL_TABLET | Freq: Three times a day (TID) | ORAL | 0 refills | Status: DC | PRN

## 2017-07-25 MED ORDER — HYDROMORPHONE HCL 2 MG PO TABS: 1.0000 mg | ORAL_TABLET | Freq: Four times a day (QID) | ORAL | 0 refills | Status: AC | PRN

## 2017-07-25 MED ORDER — HYDROCODONE-ACETAMINOPHEN 10-325 MG PO TABS: 1.0000 | ORAL_TABLET | Freq: Three times a day (TID) | ORAL | 0 refills | Status: AC | PRN

## 2017-07-25 NOTE — Progress Notes (Signed)
Family Medicine Teaching Service Daily Progress Note Intern Pager: [phone]  Patient name: [name] [place]ill Medical record number: [id number] Date of birth: [birth year] Age: 51 y.o. Gender: female  Primary Care Provider: Eartha Morrison, Michael C, MD Consultants: Orthopedics surgery Code Status: Full  Pt Overview and Major Events to Date:  11/11 - admitted for presyncope, L displaced proximal humeral fracture 11/14 - closed IM nail fixation of L humeral fracture  Assessment and Plan: [name] is a 51 y.o. female presenting after a ground-level fall resulting in left displaced spiral proximal humeral fracture. PMH is significant for severe COPD on 3-4 L at home, azithromycin and prednisone, morbid obesity, hypertension, chronic pain.  Left displaced spiral proximal humeral fracture Occurred after ground level fall. Morrison/p closed IM nail placement 11/14 with no significant complications. Cardiac workup unrevealing. Cardiology recommends outpatient event monitor with follow up. Using scheduled tylenol, 3 doses PRN Norco, 2 doses flexeril and 5 doses of 1 mg dilaudid in the last 24 hours.  - Continue dilaudid 1 mg q2h PRN, Norco TID PRN, flexeril 5mg  TID PRN, scheduled tylenol - Orthopedics following, NWB for 6 wks but can have AROM and PROM - remain on ASA for prophylaxis for 6 wks - Vitals per floor protocol  Chronic pain TakesNorco 10 325 mg TID, PO morphine-naltrexone (30-1.2) BID, Morphine Sulfate ER (30 mg) 2 pills BID at home. Per med-rec currently transitioning to morphine sulphate from morphine-naltrexone for chronic pain. Feels pain is well controlled on current regimen listed below. - scheduled tylenol TID - flexeril 5mg  TID PRN - 2 doses last 24 hrs - norco TID PRN - 3 dose last 24 hrs - dilaudid 1 mg q2h PRN - 5 doses last 24 hrs. Will d/c with 7 day tapered course.  COPD Uses 3-4 L of oxygen at home.  Home medications include Symbicort, albuterol, azithromycin daily, Zyrtec,  Flonase, Mucinex, Singulair, prednisone 10 mg.  Reports that she takes nebulizers 3-4 times daily at baseline. Vitals stable on home O2. - Continue 3-4 L oxygen for O2 greater than 90% - Duonebs TID scheduled, albuterol every 4 hours PRN, Dulera 2 puffs 2 times daily, singulair 10mg  daily, prednisone 10mg  daily - azithromycin 250mg  daily (11/12 - ) - pulmonology following  FEN/GI:  NPO, famotidine Prophylaxis: SCDs  Disposition: likely home today  Subjective:  Patient feeling ok today. Anxious about going home and being in pain, moved up appointment with outpatient pain specialist - will f/u Tuesday. Feels ok to go home today.  Objective: Temp:  [97.8 F (36.6 C)-99.1 F (37.3 C)] 97.8 F (36.6 C) (11/16 0527) Pulse Rate:  [94-102] 100 (11/16 0537) Resp:  [16-18] 18 (11/16 0537) BP: (92-134)/(50-78) 134/78 (11/16 0527) SpO2:  [90 %-95 %] 94 % (11/16 0537)  Physical Exam: General: NAD, resting in bed, obese Cardiovascular: RRR no mrg Respiratory: CTA bilaterally, diffuse end expiratory coarse wheezes. Sating well on home 3L. No increased WOB. Abdomen: soft, nontender, nondistended. Extremities: left arm in sling, L elbow dressing in place. No lower extremity edema.   Laboratory: Recent Labs  Lab 07/22/17 0544 07/23/17 0506 07/24/17 0640  WBC 14.2* 13.9* 17.5*  HGB 14.6 13.7 12.6  HCT 45.8 42.7 38.4  PLT 246 223 244   Recent Labs  Lab 07/20/17 1547 07/21/17 0609 07/22/17 0544 07/23/17 0506 07/24/17 0640  NA 132* 140 140 139 136  K 4.5 4.5 3.9 3.8 4.0  CL 95* 98* 99* 102 97*  CO2 28 35* 33* 31 30  BUN <5* <  5* 5* 7 8  CREATININE 0.65 0.77 0.70 0.63 0.61  CALCIUM 8.8* 8.6* 8.9 8.5* 8.3*  PROT 6.3* 5.7*  --   --   --   BILITOT 0.6 0.6  --   --   --   ALKPHOS 75 71  --   --   --   ALT 23 21  --   --   --   AST 27 21  --   --   --   GLUCOSE 107* 80 79 88 120*    Imaging/Diagnostic Tests: No results found. Dg Chest 1 View  Result Date: 07/20/2017 CLINICAL  DATA:  Larey SeatFell this morning and injured left arm. EXAM: CHEST 1 VIEW COMPARISON:  Chest x-ray 03/31/2012 FINDINGS: The cardiac silhouette, mediastinal and hilar contours are within normal limits and stable. Mild tortuosity of the thoracic aorta. No acute pulmonary findings. No pleural effusion or pneumothorax. No obvious rib fractures. Proximal left humeral shaft fracture is noted. IMPRESSION: No acute cardiopulmonary findings. Electronically Signed   By: Rudie MeyerP.  Gallerani M.D.   On: 07/20/2017 17:13   Dg Forearm Left  Result Date: 07/20/2017 CLINICAL DATA:  Larey SeatFell today and injured left arm. EXAM: LEFT FOREARM - 2 VIEW COMPARISON:  None. FINDINGS: The wrist and elbow joints are maintained.  No forearm fractures. IMPRESSION: No acute forearm fracture. Electronically Signed   By: Rudie MeyerP.  Gallerani M.D.   On: 07/20/2017 17:07   Dg Shoulder Left  Result Date: 07/20/2017 CLINICAL DATA:  Larey SeatFell this morning and injured left arm. EXAM: LEFT SHOULDER - 2+ VIEW COMPARISON:  None. FINDINGS: Spiral type fracture involving the upper humeral shaft. The humeral head and neck are intact. The [facility] joint is intact and the glenohumeral joint is intact IMPRESSION: No shoulder fracture or dislocation. Displaced spiral type fracture of the proximal humeral shaft. Electronically Signed   By: Rudie MeyerP.  Gallerani M.D.   On: 07/20/2017 17:06   Dg Humerus Left  Result Date: 07/20/2017 CLINICAL DATA:  Larey SeatFell today and injured left arm. EXAM: LEFT HUMERUS - 2+ VIEW COMPARISON:  None. FINDINGS: There is a displaced spiral type fracture of the proximal humeral shaft with anterior displacement. No involvement of the shoulder or elbow joints. IMPRESSION: Displaced proximal humeral shaft fracture. Electronically Signed   By: Rudie MeyerP.  Gallerani M.D.   On: 07/20/2017 17:07   Dg Hip Unilat With Pelvis 2-3 Views Left  Result Date: 07/20/2017 CLINICAL DATA:  Larey SeatFell today and injured left hip. EXAM: DG HIP (WITH OR WITHOUT PELVIS) 2-3V LEFT COMPARISON:  None.  FINDINGS: Both hips are normally located. Moderate degenerative changes bilaterally, left greater than right. No definite hip fracture. The pubic symphysis and SI joints are intact. No definite pelvic fractures. IMPRESSION: Left hip joint degenerative changes but no acute hip or pelvic fracture. Electronically Signed   By: Rudie MeyerP.  Gallerani M.D.   On: 07/20/2017 17:08    Ellwood Denseumball, Tomasina Keasling, DO 07/25/2017, 7:21 AM PGY-1, [facility] FPTS Intern pager: [phone], text pages welcome

## 2017-07-25 NOTE — Progress Notes (Signed)
Progress Note  Patient Name: Nichole RanchDrinda S Jocelyn Date of Encounter: 07/25/2017  Primary Cardiologist: New (Dr. Antoine PocheHochrein)  Subjective   No cardiac complaints.  Going home today.  No presyncope or chest pain.  No palpitations.    Inpatient Medications    Scheduled Meds: . acetaminophen  650 mg Oral TID   Or  . acetaminophen  650 mg Rectal TID  . azithromycin  250 mg Oral Daily  . enoxaparin (LOVENOX) injection  30 mg Subcutaneous Q24H  . fluticasone  1 spray Each Nare Daily  . ipratropium-albuterol  3 mL Nebulization TID  . loratadine  10 mg Oral Daily  . losartan  100 mg Oral Daily  . mometasone-formoterol  2 puff Inhalation BID  . montelukast  10 mg Oral QHS  . nicotine  21 mg Transdermal Q24H  . pantoprazole  40 mg Oral Daily  . polyethylene glycol  17 g Oral Daily  . predniSONE  10 mg Oral Daily  . senna  2 tablet Oral Daily   Continuous Infusions: . lactated ringers 10 mL/hr at 07/23/17 1201   PRN Meds: albuterol, cyclobenzaprine, furosemide, guaiFENesin, HYDROcodone-acetaminophen, HYDROmorphone (DILAUDID) injection, metoCLOPramide **OR** metoCLOPramide (REGLAN) injection, ondansetron **OR** ondansetron (ZOFRAN) IV   Vital Signs    Vitals:   07/25/17 0537 07/25/17 0910 07/25/17 0911 07/25/17 1025  BP:    (!) 118/45  Pulse: 100   (!) 102  Resp: 18     Temp:      TempSrc:      SpO2: 94% 94% 94% 93%  Weight:      Height:        Intake/Output Summary (Last 24 hours) at 07/25/2017 1230 Last data filed at 07/25/2017 0800 Gross per 24 hour  Intake 450 ml  Output -  Net 450 ml   Filed Weights   07/20/17 1516 07/23/17 1159  Weight: 210 lb (95.3 kg) 210 lb (95.3 kg)    Telemetry    NSR - Personally Reviewed  ECG    NA - Personally Reviewed   Labs    Chemistry Recent Labs  Lab 07/20/17 1547 07/21/17 0609 07/22/17 0544 07/23/17 0506 07/24/17 0640  NA 132* 140 140 139 136  K 4.5 4.5 3.9 3.8 4.0  CL 95* 98* 99* 102 97*  CO2 28 35* 33* 31 30    GLUCOSE 107* 80 79 88 120*  BUN <5* <5* 5* 7 8  CREATININE 0.65 0.77 0.70 0.63 0.61  CALCIUM 8.8* 8.6* 8.9 8.5* 8.3*  PROT 6.3* 5.7*  --   --   --   ALBUMIN 3.9 3.3*  --   --   --   AST 27 21  --   --   --   ALT 23 21  --   --   --   ALKPHOS 75 71  --   --   --   BILITOT 0.6 0.6  --   --   --   GFRNONAA >60 >60 >60 >60 >60  GFRAA >60 >60 >60 >60 >60  ANIONGAP 9 7 8 6 9      Hematology Recent Labs  Lab 07/22/17 0544 07/23/17 0506 07/24/17 0640  WBC 14.2* 13.9* 17.5*  RBC 4.88 4.61 4.24  HGB 14.6 13.7 12.6  HCT 45.8 42.7 38.4  MCV 93.9 92.6 90.6  MCH 29.9 29.7 29.7  MCHC 31.9 32.1 32.8  RDW 14.4 14.2 13.5  PLT 246 223 244    Cardiac Enzymes Recent Labs  Lab 07/21/17 0609 07/21/17 1108  07/21/17 1950  TROPONINI <0.03 <0.03 <0.03    Recent Labs  Lab 07/20/17 1605  TROPIPOC 0.01     BNP Recent Labs  Lab 07/20/17 2229  BNP 27.1     DDimer No results for input(s): DDIMER in the last 168 hours.   Radiology    Dg Humerus Left  Result Date: 07/23/2017 CLINICAL DATA:  Status post surgical internal fixation of left humeral shaft fracture. EXAM: LEFT HUMERUS - 2+ VIEW COMPARISON:  Radiographs of July 20, 2017. FINDINGS: Status post intramedullary rod fixation of oblique fracture involving proximal left humeral shaft. Good alignment of fracture components is noted. Surgical staples are noted. IMPRESSION: Status post intramedullary rod fixation of proximal left humeral shaft fracture. Electronically Signed   By: Lupita RaiderJames  Green Jr, M.D.   On: 07/23/2017 16:35   Dg Humerus Left  Result Date: 07/23/2017 CLINICAL DATA:  ORIF left humerus EXAM: DG C-ARM 61-120 MIN; LEFT HUMERUS - 2+ VIEW COMPARISON:  None FLUOROSCOPY TIME:  1 minutes 16 seconds FINDINGS: Oblique fracture of the proximal left humeral diaphysis transfixed with a intramedullary nail with multiple interlocking screws without failure or complication. Fracture is in near anatomic alignment. IMPRESSION:  Interval left humeral fracture ORIF. Electronically Signed   By: Elige KoHetal  Patel   On: 07/23/2017 15:49   Dg C-arm 61-120 Min  Result Date: 07/23/2017 CLINICAL DATA:  ORIF left humerus EXAM: DG C-ARM 61-120 MIN; LEFT HUMERUS - 2+ VIEW COMPARISON:  None FLUOROSCOPY TIME:  1 minutes 16 seconds FINDINGS: Oblique fracture of the proximal left humeral diaphysis transfixed with a intramedullary nail with multiple interlocking screws without failure or complication. Fracture is in near anatomic alignment. IMPRESSION: Interval left humeral fracture ORIF. Electronically Signed   By: Elige KoHetal  Patel   On: 07/23/2017 15:49    Cardiac Studies   07/21/17  - Left ventricle: The cavity size was normal. Systolic function was   normal. The estimated ejection fraction was in the range of 60%   to 65%. Wall motion was normal; there were no regional wall   motion abnormalities. Left ventricular diastolic function   parameters were normal. - Aortic valve: Poorly visualized. Trileaflet; normal thickness,   mildly calcified leaflets. There was mild stenosis. Mean gradient   (S): 18 mm Hg. - Pulmonary arteries: Systolic pressure could not be accurately   estimated. - Pericardium, extracardiac: A prominent pericardial fat pad was   present.   Patient Profile     51 y.o. female with a hx of tob use, COPD on home O2 and prednisone, GERD, HTN, OSA not on CPAP, chronic pain,who is being seen today for the evaluation of syncopeat the request of Dr Jennette KettleNeal.   Assessment & Plan    NEAR SYNCOPE:  Tele reviewed and NSR.  No events.  Negative cardiac work up.  We have scheduled the follow up event monitor and follow up in our office.    For questions or updates, please contact [facility] Please consult [email or web address] for contact info under Cardiology/STEMI.   Signed, Rollene RotundaJames Rehmat Murtagh, MD  07/25/2017, 12:30 PM

## 2017-07-25 NOTE — Progress Notes (Signed)
Physical Therapy Treatment Patient Details Name: [name] MRN: [id number] DOB: [birth year] Today's Date: 07/25/2017    History of Present Illness 51 y.o. female admitted on 07/20/17 for fall with left displaced proximal humeral shaft fx s/p ORIF on 07/23/17 and is NWB in sling post op. Pt with significant PMH of SOB on 2-4 L O2 [place] at home at baseline, HTN, COPD, lumbar laminectomy and decompression, and neck surgery.    PT Comments    Patient tolerated session well and able to tolerate ambulating 2380ft X2 with SPC and supervision for safety. Pt required 4L O2 via [place]. Pt demonstrated improved gait with single UE support and will benefit from use of SPC upon d/c for balance and energy conservation. Current plan remains appropriate.    Follow Up Recommendations  No PT follow up;Supervision for mobility/OOB     Equipment Recommendations  Other (comment)(single point cane)    Recommendations for Other Services       Precautions / Restrictions Precautions Precautions: Shoulder Required Braces or Orthoses: Sling Restrictions Weight Bearing Restrictions: Yes LUE Weight Bearing: Non weight bearing Other Position/Activity Restrictions: presumed NWB left arm in sling    Mobility  Bed Mobility Overal bed mobility: Needs Assistance Bed Mobility: Rolling;Sidelying to Sit Rolling: Supervision Sidelying to sit: Supervision       General bed mobility comments: supervision for safety to roll toward R side; cues for sequencing; use of rail and HOB elevated  Transfers Overall transfer level: Needs assistance Equipment used: None Transfers: Sit to/from Stand Sit to Stand: Supervision         General transfer comment: supervision for safety  Ambulation/Gait Ambulation/Gait assistance: Supervision Ambulation Distance (Feet): (9080ft X2 with seated rest break) Assistive device: None;Straight cane Gait Pattern/deviations: Step-through pattern;Decreased stride length Gait  velocity: decreased   General Gait Details: seated break due to 3/4 DOE; pt with good demonstration of pursed lip breathing; ambulated in room without AD and then rest of session with SPC; cues for sequencing of gait with use of AD and pt using 3 point gait pattern; supervision for safety   Stairs            Wheelchair Mobility    Modified Rankin (Stroke Patients Only)       Balance Overall balance assessment: Needs assistance Sitting-balance support: Feet supported;No upper extremity supported Sitting balance-Leahy Scale: Good     Standing balance support: Single extremity supported Standing balance-Leahy Scale: Good                              Cognition Arousal/Alertness: Awake/alert Behavior During Therapy: WFL for tasks assessed/performed Overall Cognitive Status: Within Functional Limits for tasks assessed                                        Exercises      General Comments General comments (skin integrity, edema, etc.): pt with SpO2 desat to 86% with ambulation and 4L O2 via [place] and up to 92% after ~1 min of PLB      Pertinent Vitals/Pain Pain Assessment: Faces Faces Pain Scale: Hurts little more Pain Location: L UE Pain Descriptors / Indicators: Aching;Sore Pain Intervention(s): Limited activity within patient's tolerance;Monitored during session;Premedicated before session;Repositioned    Home Living  Prior Function            PT Goals (current goals can now be found in the care plan section) Acute Rehab PT Goals PT Goal Formulation: With patient/family Time For Goal Achievement: 07/31/17 Potential to Achieve Goals: Good Progress towards PT goals: Progressing toward goals    Frequency    Min 5X/week      PT Plan Current plan remains appropriate    Co-evaluation              AM-PAC PT "6 Clicks" Daily Activity  Outcome Measure  Difficulty turning over in bed (including  adjusting bedclothes, sheets and blankets)?: A Little Difficulty moving from lying on back to sitting on the side of the bed? : A Little Difficulty sitting down on and standing up from a chair with arms (e.g., wheelchair, bedside commode, etc,.)?: None Help needed moving to and from a bed to chair (including a wheelchair)?: None Help needed walking in hospital room?: None Help needed climbing 3-5 steps with a railing? : None 6 Click Score: 22    End of Session Equipment Utilized During Treatment: Oxygen;Other (comment)(L arm sling, 4 L O2 [place]) Activity Tolerance: Patient tolerated treatment well Patient left: with call bell/phone within reach;with family/visitor present;in bed;Other (comment)(sitting EOB with husband ) Nurse Communication: Mobility status PT Visit Diagnosis: History of falling (Z91.81);Pain Pain - Right/Left: Left Pain - part of body: Arm     Time: [id number] PT Time Calculation (min) (ACUTE ONLY): 30 min  Charges:  $Gait Training: 8-22 mins $Therapeutic Activity: 8-22 mins                    G Codes:       Erline LevineKellyn Criss Morrison, PTA Pager: [phone]     [name] 07/25/2017, 10:58 AM

## 2017-07-25 NOTE — Progress Notes (Signed)
Discharge instructions and prescirptions reviewed with patient. Verbalized understanding. Iv removed, catheter tip intact. No questions/concerns at this time. Vss. Transported via wheelchair to lobby for discharge.

## 2017-08-12 ENCOUNTER — Encounter: Payer: Self-pay | Admitting: Physician Assistant

## 2017-08-12 NOTE — Telephone Encounter (Signed)
Close encounter 

## 2017-09-01 DIAGNOSIS — I10 Essential (primary) hypertension: Secondary | ICD-10-CM | POA: Insufficient documentation

## 2017-09-01 DIAGNOSIS — R0602 Shortness of breath: Secondary | ICD-10-CM | POA: Insufficient documentation

## 2017-09-01 DIAGNOSIS — K219 Gastro-esophageal reflux disease without esophagitis: Secondary | ICD-10-CM | POA: Insufficient documentation

## 2017-09-01 DIAGNOSIS — J449 Chronic obstructive pulmonary disease, unspecified: Secondary | ICD-10-CM | POA: Insufficient documentation

## 2017-09-01 DIAGNOSIS — J45909 Unspecified asthma, uncomplicated: Secondary | ICD-10-CM | POA: Insufficient documentation

## 2017-09-08 ENCOUNTER — Ambulatory Visit: Payer: BLUE CROSS/BLUE SHIELD | Admitting: Cardiology

## 2017-09-08 ENCOUNTER — Encounter: Payer: Self-pay | Admitting: *Deleted

## 2018-05-19 ENCOUNTER — Encounter (HOSPITAL_COMMUNITY): Payer: Self-pay

## 2018-05-19 ENCOUNTER — Other Ambulatory Visit: Payer: Self-pay

## 2018-05-19 ENCOUNTER — Inpatient Hospital Stay (HOSPITAL_COMMUNITY)
Admission: EM | Admit: 2018-05-19 | Discharge: 2018-05-23 | DRG: 190 | Disposition: A | Discharge status: Home / Self Care | Payer: Medicare Other | Attending: Nephrology | Admitting: Nephrology

## 2018-05-19 ENCOUNTER — Emergency Department (HOSPITAL_COMMUNITY): Payer: Medicare Other

## 2018-05-19 DIAGNOSIS — J9611 Chronic respiratory failure with hypoxia: Secondary | ICD-10-CM | POA: Diagnosis not present

## 2018-05-19 DIAGNOSIS — I10 Essential (primary) hypertension: Secondary | ICD-10-CM | POA: Diagnosis present

## 2018-05-19 DIAGNOSIS — M542 Cervicalgia: Secondary | ICD-10-CM | POA: Diagnosis present

## 2018-05-19 DIAGNOSIS — Z720 Tobacco use: Secondary | ICD-10-CM | POA: Diagnosis present

## 2018-05-19 DIAGNOSIS — Z79891 Long term (current) use of opiate analgesic: Secondary | ICD-10-CM

## 2018-05-19 DIAGNOSIS — D72829 Elevated white blood cell count, unspecified: Secondary | ICD-10-CM | POA: Diagnosis present

## 2018-05-19 DIAGNOSIS — J440 Chronic obstructive pulmonary disease with acute lower respiratory infection: Secondary | ICD-10-CM | POA: Diagnosis present

## 2018-05-19 DIAGNOSIS — Z7951 Long term (current) use of inhaled steroids: Secondary | ICD-10-CM

## 2018-05-19 DIAGNOSIS — Z882 Allergy status to sulfonamides status: Secondary | ICD-10-CM

## 2018-05-19 DIAGNOSIS — J441 Chronic obstructive pulmonary disease with (acute) exacerbation: Principal | ICD-10-CM | POA: Diagnosis present

## 2018-05-19 DIAGNOSIS — B9789 Other viral agents as the cause of diseases classified elsewhere: Secondary | ICD-10-CM | POA: Diagnosis present

## 2018-05-19 DIAGNOSIS — J209 Acute bronchitis, unspecified: Secondary | ICD-10-CM | POA: Diagnosis present

## 2018-05-19 DIAGNOSIS — Z79899 Other long term (current) drug therapy: Secondary | ICD-10-CM

## 2018-05-19 DIAGNOSIS — Z23 Encounter for immunization: Secondary | ICD-10-CM

## 2018-05-19 DIAGNOSIS — G8929 Other chronic pain: Secondary | ICD-10-CM | POA: Diagnosis present

## 2018-05-19 DIAGNOSIS — M2550 Pain in unspecified joint: Secondary | ICD-10-CM | POA: Diagnosis present

## 2018-05-19 DIAGNOSIS — Z880 Allergy status to penicillin: Secondary | ICD-10-CM

## 2018-05-19 DIAGNOSIS — F17210 Nicotine dependence, cigarettes, uncomplicated: Secondary | ICD-10-CM | POA: Diagnosis present

## 2018-05-19 DIAGNOSIS — M549 Dorsalgia, unspecified: Secondary | ICD-10-CM | POA: Diagnosis present

## 2018-05-19 DIAGNOSIS — J9621 Acute and chronic respiratory failure with hypoxia: Secondary | ICD-10-CM | POA: Diagnosis present

## 2018-05-19 DIAGNOSIS — E669 Obesity, unspecified: Secondary | ICD-10-CM | POA: Diagnosis present

## 2018-05-19 DIAGNOSIS — Z6835 Body mass index (BMI) 35.0-35.9, adult: Secondary | ICD-10-CM

## 2018-05-19 DIAGNOSIS — Z7952 Long term (current) use of systemic steroids: Secondary | ICD-10-CM

## 2018-05-19 DIAGNOSIS — G894 Chronic pain syndrome: Secondary | ICD-10-CM | POA: Diagnosis present

## 2018-05-19 DIAGNOSIS — Z881 Allergy status to other antibiotic agents status: Secondary | ICD-10-CM

## 2018-05-19 DIAGNOSIS — Z9981 Dependence on supplemental oxygen: Secondary | ICD-10-CM

## 2018-05-19 DIAGNOSIS — T380X5A Adverse effect of glucocorticoids and synthetic analogues, initial encounter: Secondary | ICD-10-CM | POA: Diagnosis present

## 2018-05-19 LAB — COMPREHENSIVE METABOLIC PANEL
ALBUMIN: 3.9 g/dL (ref 3.5–5.0)
ALT: 18 U/L (ref 0–44)
ANION GAP: 11 (ref 5–15)
AST: 21 U/L (ref 15–41)
Alkaline Phosphatase: 68 U/L (ref 38–126)
BILIRUBIN TOTAL: 0.5 mg/dL (ref 0.3–1.2)
BUN: 7 mg/dL (ref 6–20)
CHLORIDE: 94 mmol/L — AB (ref 98–111)
CO2: 34 mmol/L — ABNORMAL HIGH (ref 22–32)
Calcium: 8.8 mg/dL — ABNORMAL LOW (ref 8.9–10.3)
Creatinine, Ser: 0.54 mg/dL (ref 0.44–1.00)
GFR calc Af Amer: >60 mL/min (ref >60)
Glucose, Bld: 114 mg/dL — ABNORMAL HIGH (ref 70–99)
POTASSIUM: 4.8 mmol/L (ref 3.5–5.1)
Sodium: 139 mmol/L (ref 135–145)
TOTAL PROTEIN: 6.7 g/dL (ref 6.5–8.1)

## 2018-05-19 LAB — CBC WITH DIFFERENTIAL/PLATELET
BASOS PCT: 0 %
Basophils Absolute: 0 10*3/uL (ref 0.0–0.1)
EOS PCT: 0 %
Eosinophils Absolute: 0 10*3/uL (ref 0.0–0.7)
HCT: 45.8 % (ref 36.0–46.0)
Hemoglobin: 14.4 g/dL (ref 12.0–15.0)
Lymphocytes Relative: 2 %
Lymphs Abs: 0.4 10*3/uL — ABNORMAL LOW (ref 0.7–4.0)
MCH: 28.8 pg (ref 26.0–34.0)
MCHC: 31.4 g/dL (ref 30.0–36.0)
MCV: 91.6 fL (ref 78.0–100.0)
MONO ABS: 0.5 10*3/uL (ref 0.1–1.0)
MONOS PCT: 3 %
NEUTROS ABS: 16.6 10*3/uL — AB (ref 1.7–7.7)
Neutrophils Relative %: 95 %
PLATELETS: 294 10*3/uL (ref 150–400)
RBC: 5 MIL/uL (ref 3.87–5.11)
RDW: 14.5 % (ref 11.5–15.5)
WBC: 17.5 10*3/uL — ABNORMAL HIGH (ref 4.0–10.5)

## 2018-05-19 MED ORDER — LOSARTAN POTASSIUM 50 MG PO TABS
100.0000 mg | ORAL_TABLET | Freq: Every day | ORAL | Status: DC
Start: 2018-05-20 — End: 2018-05-23
  Administered 2018-05-20 – 2018-05-23 (×4): 100 mg via ORAL
  Filled 2018-05-19 (×4): qty 2

## 2018-05-19 MED ORDER — OXYCODONE-ACETAMINOPHEN 10-325 MG PO TABS: 1.0000 | ORAL_TABLET | Freq: Three times a day (TID) | ORAL | Status: DC | PRN

## 2018-05-19 MED ORDER — ALBUTEROL SULFATE (2.5 MG/3ML) 0.083% IN NEBU
2.5000 mg | INHALATION_SOLUTION | Freq: Four times a day (QID) | RESPIRATORY_TRACT | Status: DC
  Administered 2018-05-20 (×2): 2.5 mg via RESPIRATORY_TRACT
  Filled 2018-05-19 (×2): qty 3

## 2018-05-19 MED ORDER — LORATADINE 10 MG PO TABS
10.0000 mg | ORAL_TABLET | Freq: Every day | ORAL | Status: DC
  Administered 2018-05-20 – 2018-05-23 (×4): 10 mg via ORAL
  Filled 2018-05-19 (×4): qty 1

## 2018-05-19 MED ORDER — GABAPENTIN 400 MG PO CAPS
400.0000 mg | ORAL_CAPSULE | Freq: Three times a day (TID) | ORAL | Status: DC
  Administered 2018-05-20 – 2018-05-23 (×11): 400 mg via ORAL
  Filled 2018-05-19 (×11): qty 1

## 2018-05-19 MED ORDER — OXYCODONE-ACETAMINOPHEN 5-325 MG PO TABS
1.0000 | ORAL_TABLET | Freq: Three times a day (TID) | ORAL | Status: DC | PRN
  Administered 2018-05-20 – 2018-05-23 (×8): 1 via ORAL
  Filled 2018-05-19 (×8): qty 1

## 2018-05-19 MED ORDER — DIPHENHYDRAMINE HCL 50 MG/ML IJ SOLN: 25.0000 mg | Freq: Three times a day (TID) | INTRAMUSCULAR | Status: DC | PRN

## 2018-05-19 MED ORDER — BUDESONIDE 0.5 MG/2ML IN SUSP
0.5000 mg | Freq: Two times a day (BID) | RESPIRATORY_TRACT | Status: DC
  Administered 2018-05-19 – 2018-05-23 (×8): 0.5 mg via RESPIRATORY_TRACT
  Filled 2018-05-19 (×8): qty 2

## 2018-05-19 MED ORDER — ENOXAPARIN SODIUM 40 MG/0.4ML ~~LOC~~ SOLN
40.0000 mg | Freq: Every day | SUBCUTANEOUS | Status: DC
  Administered 2018-05-20 – 2018-05-22 (×4): 40 mg via SUBCUTANEOUS
  Filled 2018-05-19 (×5): qty 0.4

## 2018-05-19 MED ORDER — MONTELUKAST SODIUM 10 MG PO TABS
10.0000 mg | ORAL_TABLET | Freq: Every day | ORAL | Status: DC
  Administered 2018-05-20 – 2018-05-22 (×4): 10 mg via ORAL
  Filled 2018-05-19 (×4): qty 1

## 2018-05-19 MED ORDER — SODIUM CHLORIDE 0.9 % IV SOLN
1.0000 g | Freq: Three times a day (TID) | INTRAVENOUS | Status: DC
  Administered 2018-05-20 – 2018-05-22 (×7): 1 g via INTRAVENOUS
  Filled 2018-05-19 (×10): qty 1

## 2018-05-19 MED ORDER — ALBUTEROL SULFATE (2.5 MG/3ML) 0.083% IN NEBU: 2.5000 mg | INHALATION_SOLUTION | RESPIRATORY_TRACT | Status: DC | PRN

## 2018-05-19 MED ORDER — TIOTROPIUM BROMIDE MONOHYDRATE 18 MCG IN CAPS
18.0000 ug | ORAL_CAPSULE | Freq: Every day | RESPIRATORY_TRACT | Status: DC
  Administered 2018-05-20 – 2018-05-21 (×2): 18 ug via RESPIRATORY_TRACT
  Filled 2018-05-19: qty 5

## 2018-05-19 MED ORDER — IPRATROPIUM-ALBUTEROL 0.5-2.5 (3) MG/3ML IN SOLN
3.0000 mL | Freq: Once | RESPIRATORY_TRACT | Status: AC
  Administered 2018-05-19: 3 mL via RESPIRATORY_TRACT
  Filled 2018-05-19: qty 3

## 2018-05-19 MED ORDER — GUAIFENESIN ER 600 MG PO TB12
600.0000 mg | ORAL_TABLET | Freq: Two times a day (BID) | ORAL | Status: DC
  Administered 2018-05-20 – 2018-05-21 (×4): 600 mg via ORAL
  Filled 2018-05-19 (×4): qty 1

## 2018-05-19 MED ORDER — ACETAMINOPHEN 650 MG RE SUPP: 650.0000 mg | Freq: Four times a day (QID) | RECTAL | Status: DC | PRN

## 2018-05-19 MED ORDER — ONDANSETRON HCL 4 MG PO TABS: 4.0000 mg | ORAL_TABLET | Freq: Four times a day (QID) | ORAL | Status: DC | PRN

## 2018-05-19 MED ORDER — ACETAMINOPHEN 325 MG PO TABS: 650.0000 mg | ORAL_TABLET | Freq: Four times a day (QID) | ORAL | Status: DC | PRN

## 2018-05-19 MED ORDER — SODIUM CHLORIDE 0.9 % IV SOLN
2.0000 g | Freq: Once | INTRAVENOUS | Status: AC
  Administered 2018-05-20: 2 g via INTRAVENOUS
  Filled 2018-05-19: qty 2

## 2018-05-19 MED ORDER — ONDANSETRON HCL 4 MG/2ML IJ SOLN: 4.0000 mg | Freq: Four times a day (QID) | INTRAMUSCULAR | Status: DC | PRN

## 2018-05-19 MED ORDER — SODIUM CHLORIDE 0.9% FLUSH
3.0000 mL | Freq: Two times a day (BID) | INTRAVENOUS | Status: DC
  Administered 2018-05-20 – 2018-05-23 (×5): 3 mL via INTRAVENOUS

## 2018-05-19 MED ORDER — OXYCODONE HCL 5 MG PO TABS
5.0000 mg | ORAL_TABLET | Freq: Three times a day (TID) | ORAL | Status: DC | PRN
  Administered 2018-05-20 – 2018-05-23 (×8): 5 mg via ORAL
  Filled 2018-05-19 (×8): qty 1

## 2018-05-19 MED ORDER — FLUTICASONE PROPIONATE 50 MCG/ACT NA SUSP
1.0000 | Freq: Every day | NASAL | Status: DC
  Administered 2018-05-20 – 2018-05-23 (×4): 1 via NASAL
  Filled 2018-05-19: qty 16

## 2018-05-19 MED ORDER — SODIUM CHLORIDE 0.9% FLUSH
3.0000 mL | Freq: Two times a day (BID) | INTRAVENOUS | Status: DC
  Administered 2018-05-21 – 2018-05-23 (×2): 3 mL via INTRAVENOUS

## 2018-05-19 MED ORDER — ALBUTEROL SULFATE (2.5 MG/3ML) 0.083% IN NEBU
2.5000 mg | INHALATION_SOLUTION | Freq: Once | RESPIRATORY_TRACT | Status: AC
  Administered 2018-05-19: 2.5 mg via RESPIRATORY_TRACT
  Filled 2018-05-19: qty 3

## 2018-05-19 MED ORDER — NICOTINE 21 MG/24HR TD PT24
21.0000 mg | MEDICATED_PATCH | Freq: Every day | TRANSDERMAL | Status: DC
  Administered 2018-05-20 – 2018-05-23 (×5): 21 mg via TRANSDERMAL
  Filled 2018-05-19 (×5): qty 1

## 2018-05-19 MED ORDER — ARFORMOTEROL TARTRATE 15 MCG/2ML IN NEBU
15.0000 ug | INHALATION_SOLUTION | Freq: Two times a day (BID) | RESPIRATORY_TRACT | Status: DC
  Administered 2018-05-20 – 2018-05-23 (×7): 15 ug via RESPIRATORY_TRACT
  Filled 2018-05-19 (×8): qty 2

## 2018-05-19 MED ORDER — METHYLPREDNISOLONE SODIUM SUCC 125 MG IJ SOLR
60.0000 mg | Freq: Three times a day (TID) | INTRAMUSCULAR | Status: DC
  Administered 2018-05-20 – 2018-05-21 (×6): 60 mg via INTRAVENOUS
  Filled 2018-05-19 (×6): qty 2

## 2018-05-19 MED ORDER — MORPHINE SULFATE ER 30 MG PO TBCR
30.0000 mg | EXTENDED_RELEASE_TABLET | Freq: Two times a day (BID) | ORAL | Status: DC
  Administered 2018-05-20 – 2018-05-23 (×8): 30 mg via ORAL
  Filled 2018-05-19 (×8): qty 1

## 2018-05-19 NOTE — ED Notes (Signed)
ED TO INPATIENT HANDOFF REPORT  Name/Age/Gender [name] 52 y.o. female  Code Status    Code Status Orders  (From admission, onward)         Start     Ordered   05/19/18 2301  Full code  Continuous     05/19/18 2305        Code Status History    Date Active Date Inactive Code Status Order ID Comments User Context   07/20/2017 2220 07/25/2017 1814 Full Code [id number]  Eloise Levels, MD Inpatient      Home/SNF/Other Home  Chief Complaint short of breath   Level of Care/Admitting Diagnosis ED Disposition    ED Disposition Condition [place]: [facility] [[id number]]  Level of Care: Telemetry [5]  Admit to tele based on following criteria: Complex arrhythmia (Bradycardia/Tachycardia)  Diagnosis: COPD exacerbation [facility]) [[id number]]  Admitting Physician: Norval Morton [[id number]]  Attending Physician: Norval Morton [[id number]]  PT Class (Do Not Modify): Observation [104]  PT Acc Code (Do Not Modify): Observation [10022]       Medical History Past Medical History:  Diagnosis Date  . Allergic rhinitis   . Asthma   . COPD (chronic obstructive pulmonary disease) ([place])    on home O2  . GERD (gastroesophageal reflux disease)   . Hypertension   . Shortness of breath     Allergies Allergies  Allergen Reactions  . Amoxicillin-Pot Clavulanate Itching    Ok with benadryl  . Erythromycin Rash    Ok with benadryl   . Levofloxacin Itching    Ok with benadryl  . Moxifloxacin Itching    Reaction to Avelox - ok with benadryl  . Sulfamethoxazole-Trimethoprim Itching    Ok with benadryl    IV Location/Drains/Wounds Patient Lines/Drains/Airways Status   Active Line/Drains/Airways    Name:   Placement date:   Placement time:   Site:   Days:   Peripheral IV 05/19/18 Left Antecubital   05/19/18    1650    Antecubital   less than 1   Incision 04/06/12 Back Other (Comment)   04/06/12    0945     2234   Incision (Closed)  07/23/17 Arm Left   07/23/17    1433     300          Labs/Imaging Results for orders placed or performed during the hospital encounter of 05/19/18 (from the past 48 hour(s))  CBC with Differential/Platelet     Status: Abnormal   Collection Time: 05/19/18  6:09 PM  Result Value Ref Range   WBC 17.5 (H) 4.0 - 10.5 K/uL   RBC 5.00 3.87 - 5.11 MIL/uL   Hemoglobin 14.4 12.0 - 15.0 g/dL   HCT 45.8 36.0 - 46.0 %   MCV 91.6 78.0 - 100.0 fL   MCH 28.8 26.0 - 34.0 pg   MCHC 31.4 30.0 - 36.0 g/dL   RDW 14.5 11.5 - 15.5 %   Platelets 294 150 - 400 K/uL   Neutrophils Relative % 95 %   Neutro Abs 16.6 (H) 1.7 - 7.7 K/uL   Lymphocytes Relative 2 %   Lymphs Abs 0.4 (L) 0.7 - 4.0 K/uL   Monocytes Relative 3 %   Monocytes Absolute 0.5 0.1 - 1.0 K/uL   Eosinophils Relative 0 %   Eosinophils Absolute 0.0 0.0 - 0.7 K/uL   Basophils Relative 0 %   Basophils Absolute 0.0 0.0 - 0.1 K/uL  Comment: Performed at [facility], [place] [address], [place], [place] [address]  Comprehensive metabolic panel     Status: Abnormal   Collection Time: 05/19/18  6:09 PM  Result Value Ref Range   Sodium 139 135 - 145 mmol/L   Potassium 4.8 3.5 - 5.1 mmol/L   Chloride 94 (L) 98 - 111 mmol/L   CO2 34 (H) 22 - 32 mmol/L   Glucose, Bld 114 (H) 70 - 99 mg/dL   BUN 7 6 - 20 mg/dL   Creatinine, Ser 0.54 0.44 - 1.00 mg/dL   Calcium 8.8 (L) 8.9 - 10.3 mg/dL   Total Protein 6.7 6.5 - 8.1 g/dL   Albumin 3.9 3.5 - 5.0 g/dL   AST 21 15 - 41 U/L   ALT 18 0 - 44 U/L   Alkaline Phosphatase 68 38 - 126 U/L   Total Bilirubin 0.5 0.3 - 1.2 mg/dL   GFR calc non Af Amer >60 >60 mL/min   GFR calc Af Amer >60 >60 mL/min    Comment: (NOTE) The eGFR has been calculated using the CKD EPI equation. This calculation has not been validated in all clinical situations. eGFR's persistently <60 mL/min signify possible Chronic Kidney Disease.    Anion gap 11 5 - 15    Comment: Performed at [facility], [place] [address], [place], [place] [address]   Dg Chest 2 View  Result Date: 05/19/2018 CLINICAL DATA:  Shortness of Breath EXAM: CHEST - 2 VIEW COMPARISON:  July 20, 2017 FINDINGS: There is no edema or consolidation. The heart size and pulmonary vascularity are normal. No adenopathy. There is postoperative change in the proximal left humerus. IMPRESSION: No edema or consolidation. Electronically Signed   By: Lowella Grip III M.D.   On: 05/19/2018 19:44    Pending Labs Unresulted Labs (From admission, onward)    Start     Ordered   05/20/18 2637  Basic metabolic panel  Tomorrow morning,   R     05/19/18 2305   05/20/18 0500  CBC  Tomorrow morning,   R     05/19/18 2305   05/19/18 2258  Culture, sputum-assessment  Once,   R    Question:  Patient immune status  Answer:  Immunocompromised   05/19/18 2305   05/19/18 2257  Respiratory Panel by PCR  (Respiratory virus panel)  Once,   R    Question:  Patient immune status  Answer:  Immunocompromised   05/19/18 2305          Vitals/Pain Today's Vitals   05/19/18 2013 05/19/18 2201 05/19/18 2202 05/19/18 2339  BP:  (!) 142/85 (!) 142/85   Pulse:  95 95   Resp:  15 17   Temp:      TempSrc:      SpO2: 95% 95% 95% 96%  PainSc:        Isolation Precautions Droplet precaution  Medications Medications  morphine (MS CONTIN) 12 hr tablet 30 mg (has no administration in time range)  losartan (COZAAR) tablet 100 mg (has no administration in time range)  fluticasone (FLONASE) 50 MCG/ACT nasal spray 1 spray (has no administration in time range)  loratadine (CLARITIN) tablet 10 mg (has no administration in time range)  montelukast (SINGULAIR) tablet 10 mg (has no administration in time range)  gabapentin (NEURONTIN) capsule 400 mg (has no administration in time range)  sodium chloride flush (NS) 0.9 % injection 3 mL (has no administration in time range)  ceFEPIme (MAXIPIME) 1 g in sodium chloride 0.9 % 100 mL IVPB (has no  administration in time range)  diphenhydrAMINE (BENADRYL) injection 25 mg (has no administration in time range)  arformoterol (BROVANA) nebulizer solution 15 mcg (15 mcg Nebulization Not Given 05/19/18 2341)  budesonide (PULMICORT) nebulizer solution 0.5 mg (0.5 mg Nebulization Given 05/19/18 2339)  enoxaparin (LOVENOX) injection 40 mg (has no administration in time range)  sodium chloride flush (NS) 0.9 % injection 3 mL (has no administration in time range)  ondansetron (ZOFRAN) tablet 4 mg (has no administration in time range)    Or  ondansetron (ZOFRAN) injection 4 mg (has no administration in time range)  acetaminophen (TYLENOL) tablet 650 mg (has no administration in time range)    Or  acetaminophen (TYLENOL) suppository 650 mg (has no administration in time range)  guaiFENesin (MUCINEX) 12 hr tablet 600 mg (has no administration in time range)  methylPREDNISolone sodium succinate (SOLU-MEDROL) 125 mg/2 mL injection 60 mg (has no administration in time range)  Tiotropium Bromide Monohydrate AERS 1 puff (has no administration in time range)  albuterol (PROVENTIL) (2.5 MG/3ML) 0.083% nebulizer solution 2.5 mg (has no administration in time range)  albuterol (PROVENTIL) (2.5 MG/3ML) 0.083% nebulizer solution 2.5 mg (has no administration in time range)  ceFEPIme (MAXIPIME) 2 g in sodium chloride 0.9 % 100 mL IVPB (has no administration in time range)  oxyCODONE-acetaminophen (PERCOCET/ROXICET) 5-325 MG per tablet 1 tablet (has no administration in time range)    And  oxyCODONE (Oxy IR/ROXICODONE) immediate release tablet 5 mg (has no administration in time range)  nicotine (NICODERM CQ - dosed in mg/24 hours) patch 21 mg (has no administration in time range)  ipratropium-albuterol (DUONEB) 0.5-2.5 (3) MG/3ML nebulizer solution 3 mL (3 mLs Nebulization Given 05/19/18 2013)  albuterol (PROVENTIL) (2.5 MG/3ML) 0.083% nebulizer solution 2.5 mg (2.5 mg Nebulization Given 05/19/18 2013)     Mobility walks

## 2018-05-19 NOTE — ED Provider Notes (Signed)
[facility]-EMERGENCY DEPT Provider Note   CSN: [id number] Arrival date & time: 05/19/18  1657     History   Chief Complaint Chief Complaint  Patient presents with  . Shortness of Breath    HPI [name] is a 52 y.o. female.  Patient complains of shortness of breath.  Patient has a history of COPD and she is taking 4 L nasal  The history is provided by the patient. No language interpreter was used.  Shortness of Breath  This is a recurrent problem. The problem occurs continuously.The current episode started more than 2 days ago. The problem has not changed since onset.Associated symptoms include wheezing. Pertinent negatives include no fever, no headaches, no cough, no chest pain, no abdominal pain and no rash. The problem's precipitants include smoke. She has tried ipratropium inhalers for the symptoms. The treatment provided moderate relief. She has had prior hospitalizations. She has had prior ED visits. She has had prior ICU admissions. Associated medical issues include COPD.    Past Medical History:  Diagnosis Date  . Allergic rhinitis   . Asthma   . COPD (chronic obstructive pulmonary disease) (HCC)    on home O2  . GERD (gastroesophageal reflux disease)   . Hypertension   . Shortness of breath     Patient Active Problem List   Diagnosis Date Noted  . Shortness of breath   . Hypertension   . GERD (gastroesophageal reflux disease)   . COPD (chronic obstructive pulmonary disease) (HCC)   . Asthma   . Closed fracture of left proximal humerus   . Obesity due to excess calories   . Surgery, elective   . Back pain   . Fall   . Near syncope   . Acute pain of left shoulder   . Closed displaced oblique fracture of shaft of left humerus 07/20/2017  . Lumbar spinal stenosis 04/03/2012    Class: Diagnosis of  . COPD exacerbation (HCC) 05/31/2010  . ALLERGIC RHINITIS 05/09/2010  . DYSPNEA ON EXERTION 05/09/2010    Past Surgical History:    Procedure Laterality Date  . HUMERUS IM NAIL Left 07/23/2017   Procedure: INTRAMEDULLARY (IM) NAIL HUMERAL;  Surgeon: Yolonda Kida, MD;  Location: [facility] OR;  Service: Orthopedics;  Laterality: Left;  . LUMBAR LAMINECTOMY/DECOMPRESSION MICRODISCECTOMY  04/06/2012   Procedure: LUMBAR LAMINECTOMY/DECOMPRESSION MICRODISCECTOMY;  Surgeon: Kerrin Champagne, MD;  Location: [facility] OR;  Service: Orthopedics;  Laterality: N/A;  Left L3-4 and L4-5 lateral recess decompression MIS  . NECK SURGERY  Sept 2005  . TOTAL ABDOMINAL HYSTERECTOMY  Oct 2003  . TUBAL LIGATION  1995     OB History   None      Home Medications    Prior to Admission medications   Medication Sig Start Date End Date Taking? Authorizing Provider  albuterol (PROVENTIL) (2.5 MG/3ML) 0.083% nebulizer solution Take 2.5 mg every 4 (four) hours as needed by nebulization for wheezing or shortness of breath.    Yes [provider]  azithromycin (ZITHROMAX) 250 MG tablet Take 250 mg daily by mouth. Continuous course   Yes [provider]  budesonide-formoterol (SYMBICORT) 160-4.5 MCG/ACT inhaler Inhale 2 puffs 2 (two) times daily into the lungs.   Yes [provider]  cetirizine (ZYRTEC) 10 MG tablet Take 10 mg daily by mouth.   Yes [provider]  fluticasone (FLONASE) 50 MCG/ACT nasal spray Place 1 spray daily into both nostrils.    Yes [provider]  gabapentin (  NEURONTIN) 400 MG capsule Take 400 mg by mouth 3 (three) times daily. 05/03/18  Yes [provider]  Guaifenesin (MUCINEX MAXIMUM STRENGTH) 1200 MG TB12 Take 1,200 mg daily as needed by mouth (to thin phlegm/cough).   Yes [provider]  losartan (COZAAR) 100 MG tablet Take 100 mg daily by mouth.  12/04/11  Yes [provider]  montelukast (SINGULAIR) 10 MG tablet Take 10 mg at bedtime by mouth.   Yes [provider]  Morphine Sulfate ER (MORPHABOND ER) 30 MG T12A Take 30 mg 2 (two) times daily by  mouth.   Yes [provider]  nystatin-triamcinolone ointment (MYCOLOG) Apply 1 application 2 (two) times daily as needed topically (rash).  04/15/17  Yes [provider]  oxyCODONE-acetaminophen (PERCOCET) 10-325 MG tablet Take 1 tablet by mouth 3 (three) times daily. 04/17/18  Yes [provider]  predniSONE (DELTASONE) 10 MG tablet Take 10 mg daily by mouth.   Yes [provider]  Tiotropium Bromide Monohydrate (SPIRIVA RESPIMAT) 2.5 MCG/ACT AERS Inhale 1 puff daily into the lungs.   Yes [provider]  Aclidinium Bromide (TUDORZA PRESSAIR) 400 MCG/ACT AEPB Inhale 1 puff into the lungs 2 (two) times daily. Patient not taking: Reported on 07/20/2017 04/29/12   Barbaraann Share, MD  OXYGEN Inhale 3-4 L continuous into the lungs.    [provider]    Family History Family History  Problem Relation Age of Onset  . Emphysema Father   . Rheum arthritis Father   . Lung cancer Father   . Emphysema Mother     Social History Social History   Tobacco Use  . Smoking status: Current Every Day Smoker    Packs/day: 1.00    Years: 30.00    Pack years: 30.00    Types: Cigarettes  . Smokeless tobacco: Never Used  . Tobacco comment: currently smoking 1/2 ppd.   Substance Use Topics  . Alcohol use: Yes    Comment: occassionally  . Drug use: No     Allergies   Amoxicillin-pot clavulanate; Erythromycin; Levofloxacin; Moxifloxacin; and Sulfamethoxazole-trimethoprim   Review of Systems Review of Systems  Constitutional: Negative for appetite change, fatigue and fever.  HENT: Negative for congestion, ear discharge and sinus pressure.   Eyes: Negative for discharge.  Respiratory: Positive for shortness of breath and wheezing. Negative for cough.   Cardiovascular: Negative for chest pain.  Gastrointestinal: Negative for abdominal pain and diarrhea.  Genitourinary: Negative for frequency and hematuria.  Musculoskeletal: Negative for back  pain.  Skin: Negative for rash.  Neurological: Negative for seizures and headaches.  Psychiatric/Behavioral: Negative for hallucinations.     Physical Exam Updated Vital Signs BP 137/90 (BP Location: Right Arm)   Pulse (!) 106   Temp 98.5 F (36.9 C) (Oral)   Resp (!) 23   SpO2 95%   Physical Exam  Constitutional: She is oriented to person, place, and time. She appears well-developed.  HENT:  Head: Normocephalic.  Eyes: Conjunctivae and EOM are normal. No scleral icterus.  Neck: Neck supple. No thyromegaly present.  Cardiovascular: Normal rate and regular rhythm. Exam reveals no gallop and no friction rub.  No murmur heard. Pulmonary/Chest: No stridor. She has wheezes. She has no rales. She exhibits no tenderness.  Abdominal: She exhibits no distension. There is no tenderness. There is no rebound.  Musculoskeletal: Normal range of motion. She exhibits no edema.  Lymphadenopathy:    She has no cervical adenopathy.  Neurological: She is oriented to person,  place, and time. She exhibits normal muscle tone. Coordination normal.  Skin: No rash noted. No erythema.  Psychiatric: She has a normal mood and affect. Her behavior is normal.     ED Treatments / Results  Labs (all labs ordered are listed, but only abnormal results are displayed) Labs Reviewed  CBC WITH DIFFERENTIAL/PLATELET - Abnormal; Notable for the following components:      Result Value   WBC 17.5 (*)    Neutro Abs 16.6 (*)    Lymphs Abs 0.4 (*)    All other components within normal limits  COMPREHENSIVE METABOLIC PANEL - Abnormal; Notable for the following components:   Chloride 94 (*)    CO2 34 (*)    Glucose, Bld 114 (*)    Calcium 8.8 (*)    All other components within normal limits    EKG None  Radiology Dg Chest 2 View  Result Date: 05/19/2018 CLINICAL DATA:  Shortness of Breath EXAM: CHEST - 2 VIEW COMPARISON:  July 20, 2017 FINDINGS: There is no edema or consolidation. The heart size and  pulmonary vascularity are normal. No adenopathy. There is postoperative change in the proximal left humerus. IMPRESSION: No edema or consolidation. Electronically Signed   By: Bretta Bang III M.D.   On: 05/19/2018 19:44    Procedures Procedures (including critical care time)  Medications Ordered in ED Medications  ipratropium-albuterol (DUONEB) 0.5-2.5 (3) MG/3ML nebulizer solution 3 mL (3 mLs Nebulization Given 05/19/18 2013)  albuterol (PROVENTIL) (2.5 MG/3ML) 0.083% nebulizer solution 2.5 mg (2.5 mg Nebulization Given 05/19/18 2013)     Initial Impression / Assessment and Plan / ED Course  I have reviewed the triage vital signs and the nursing notes.  Pertinent labs & imaging results that were available during my care of the patient were reviewed by me and considered in my medical decision making (see chart for details).     Patient with exacerbation of COPD.  She will be admitted for further treatment  Final Clinical Impressions(s) / ED Diagnoses   Final diagnoses:  COPD exacerbation [facility])    ED Discharge Orders    None       Bethann Berkshire, MD 05/19/18 2236

## 2018-05-19 NOTE — ED Triage Notes (Signed)
She c/o increasing shortness of breath x 2-3 days. She tells [place] she took an antibiotic and steroid for u.t.i. Some 3 weeks ago and "got better--but now it's coming back". She rec'd. IV solu medrol and two duoneb tx en route to hospital. She arrives short of breath and in no distress. She is on home O2 con't. At 4 l.p.m.

## 2018-05-19 NOTE — ED Notes (Signed)
ED TO INPATIENT HANDOFF REPORT  Name/Age/Gender [name] 52 y.o. female  Code Status Code Status History    Date Active Date Inactive Code Status Order ID Comments User Context   07/20/2017 2220 07/25/2017 1814 Full Code [id number]  Eloise Levels, MD Inpatient      Home/SNF/Other Home  Chief Complaint short of breath   Level of Care/Admitting Diagnosis ED Disposition    ED Disposition Condition Comment   Admit  The patient appears reasonably stabilized for admission considering the current resources, flow, and capabilities available in the ED at this time, and I doubt any other [facility] requiring further screening and/or treatment in the ED prior to admission is  present.       Medical History Past Medical History:  Diagnosis Date  . Allergic rhinitis   . Asthma   . COPD (chronic obstructive pulmonary disease) ([place])    on home O2  . GERD (gastroesophageal reflux disease)   . Hypertension   . Shortness of breath     Allergies Allergies  Allergen Reactions  . Amoxicillin-Pot Clavulanate Itching    Ok with benadryl  . Erythromycin Rash    Ok with benadryl   . Levofloxacin Itching    Ok with benadryl  . Moxifloxacin Itching    Reaction to Avelox - ok with benadryl  . Sulfamethoxazole-Trimethoprim Itching    Ok with benadryl    IV Location/Drains/Wounds Patient Lines/Drains/Airways Status   Active Line/Drains/Airways    Name:   Placement date:   Placement time:   Site:   Days:   Peripheral IV 05/19/18 Left Antecubital   05/19/18    1650    Antecubital   less than 1   Incision 04/06/12 Back Other (Comment)   04/06/12    0945     2234   Incision (Closed) 07/23/17 Arm Left   07/23/17    1433     300          Labs/Imaging Results for orders placed or performed during the hospital encounter of 05/19/18 (from the past 48 hour(s))  CBC with Differential/Platelet     Status: Abnormal   Collection Time: 05/19/18  6:09 PM  Result Value Ref Range   WBC 17.5  (H) 4.0 - 10.5 K/uL   RBC 5.00 3.87 - 5.11 MIL/uL   Hemoglobin 14.4 12.0 - 15.0 g/dL   HCT 45.8 36.0 - 46.0 %   MCV 91.6 78.0 - 100.0 fL   MCH 28.8 26.0 - 34.0 pg   MCHC 31.4 30.0 - 36.0 g/dL   RDW 14.5 11.5 - 15.5 %   Platelets 294 150 - 400 K/uL   Neutrophils Relative % 95 %   Neutro Abs 16.6 (H) 1.7 - 7.7 K/uL   Lymphocytes Relative 2 %   Lymphs Abs 0.4 (L) 0.7 - 4.0 K/uL   Monocytes Relative 3 %   Monocytes Absolute 0.5 0.1 - 1.0 K/uL   Eosinophils Relative 0 %   Eosinophils Absolute 0.0 0.0 - 0.7 K/uL   Basophils Relative 0 %   Basophils Absolute 0.0 0.0 - 0.1 K/uL    Comment: Performed at [facility], [place] [address] [place], [place] [address]  Comprehensive metabolic panel     Status: Abnormal   Collection Time: 05/19/18  6:09 PM  Result Value Ref Range   Sodium 139 135 - 145 mmol/L   Potassium 4.8 3.5 - 5.1 mmol/L   Chloride 94 (L) 98 - 111 mmol/L   CO2  34 (H) 22 - 32 mmol/L   Glucose, Bld 114 (H) 70 - 99 mg/dL   BUN 7 6 - 20 mg/dL   Creatinine, Ser 0.54 0.44 - 1.00 mg/dL   Calcium 8.8 (L) 8.9 - 10.3 mg/dL   Total Protein 6.7 6.5 - 8.1 g/dL   Albumin 3.9 3.5 - 5.0 g/dL   AST 21 15 - 41 U/L   ALT 18 0 - 44 U/L   Alkaline Phosphatase 68 38 - 126 U/L   Total Bilirubin 0.5 0.3 - 1.2 mg/dL   GFR calc non Af Amer >60 >60 mL/min   GFR calc Af Amer >60 >60 mL/min    Comment: (NOTE) The eGFR has been calculated using the CKD EPI equation. This calculation has not been validated in all clinical situations. eGFR's persistently <60 mL/min signify possible Chronic Kidney Disease.    Anion gap 11 5 - 15    Comment: Performed at [facility], [place] [address] [place], [place] [address]   Dg Chest 2 View  Result Date: 05/19/2018 CLINICAL DATA:  Shortness of Breath EXAM: CHEST - 2 VIEW COMPARISON:  July 20, 2017 FINDINGS: There is no edema or consolidation. The heart size and pulmonary vascularity are normal. No adenopathy. There is  postoperative change in the proximal left humerus. IMPRESSION: No edema or consolidation. Electronically Signed   By: Lowella Grip III M.D.   On: 05/19/2018 19:44    Pending Labs Unresulted Labs (From admission, onward)   None      Vitals/Pain Today's Vitals   05/19/18 1709 05/19/18 1712 05/19/18 2013 05/19/18 2201  BP:  137/90  (!) 142/85  Pulse:  (!) 106  95  Resp:  (!) 23  15  Temp:  98.5 F (36.9 C)    TempSrc:  Oral    SpO2:  99% 95% 95%  PainSc: 0-No pain       Isolation Precautions No active isolations  Medications Medications  ipratropium-albuterol (DUONEB) 0.5-2.5 (3) MG/3ML nebulizer solution 3 mL (3 mLs Nebulization Given 05/19/18 2013)  albuterol (PROVENTIL) (2.5 MG/3ML) 0.083% nebulizer solution 2.5 mg (2.5 mg Nebulization Given 05/19/18 2013)    Mobility walks

## 2018-05-19 NOTE — ED Notes (Signed)
Bed: WA08 Expected date:  Expected time:  Means of arrival:  Comments: EMS-COPD

## 2018-05-19 NOTE — H&P (Addendum)
History and Physical    [name] QMV:[id number] DOB: [birth year] DOA: 05/19/2018  Referring MD/NP/PA: Estell Harpin, MD PCP: [name], MD  Patient coming from: Home  Chief Complaint: Shortness of breath  I have personally briefly reviewed patient's old medical records in [place] [facility]   HPI: [name] is a 52 y.o. female with medical history significant of HTN, COPD oxygen dependent on 4 L, chronic pain and GERD; who presented with complaints of worsening shortness of breath and cough.  Symptoms have been present over several weeks, but acutely worse over the last week. Patient is followed in the outpatient setting by pulmonology at [facility].  Review of records shows that patient was supposed to have bronchoscopy performed in 2 days after last office visit on 8/19 to evaluate for resistant bacteria given frequent COPD exacerbations.  Patient has been being treated with azithromycin, but had been changed over to Levaquin and given a short course of increased prednisone of 40 mg/day for approximately 3 days.  Since completing antibiotics and prednisone patient has noted a progressive decline.  Her grandchildren and husband have been sick with nasal congestion recently.  She reports having a cough, but has not had enough breath to cough anything up.  She complains of significant sinus congestion, postnasal drainage, ear fullness, wheezing, and generalized malaise.  Patient admits to continuing to intermittently smoke, but reports that it has decreased due to breathing issues.  She denies having any significant fever, hemoptysis, weight loss, nausea, vomiting, or diarrhea.  She has been taking all of her inhalers as prescribed, but has been utilizing her albuterol nebulized treatments every hour lately without relief of symptoms.  Patient reports that steroids usually improved symptoms.  Other associated symptoms include chronic pain of her neck, back and  leg which she is on pain  medication to treat.  En route with EMS patient had been given 125 mg of Solu-Medrol and 2 DuoNeb breathing treatments without relief of symptoms.  ED Course: Upon admission to the emergency department patient was found to be afebrile, pulse 95-106, respiration 15-23, blood pressure stable, and O2 saturation 9599% on home 4 L of oxygen.  Labs revealed WBC 17.5, chloride 94, and CO2 34.  Chest x-ray showed no edema or consolidation.  Patient was given a DuoNeb breathing treatment [facility] called to admit for COPD exacerbation.  Review of Systems  Constitutional: Positive for diaphoresis and malaise/fatigue. Negative for fever.  HENT: Positive for congestion, ear pain and sinus pain. Negative for nosebleeds.   Eyes: Negative for photophobia and pain.  Respiratory: Positive for cough, shortness of breath and wheezing.   Cardiovascular: Negative for chest pain and leg swelling.  Gastrointestinal: Negative for abdominal pain, nausea and vomiting.  Genitourinary: Negative for dysuria and hematuria.  Musculoskeletal: Positive for back pain, joint pain and neck pain.  Skin: Negative for rash.  Neurological: Negative for focal weakness and loss of consciousness.  Psychiatric/Behavioral: Negative for hallucinations and memory loss.    Past Medical History:  Diagnosis Date  . Allergic rhinitis   . Asthma   . COPD (chronic obstructive pulmonary disease) (HCC)    on home O2  . GERD (gastroesophageal reflux disease)   . Hypertension   . Shortness of breath     Past Surgical History:  Procedure Laterality Date  . HUMERUS IM NAIL Left 07/23/2017   Procedure: INTRAMEDULLARY (IM) NAIL HUMERAL;  Surgeon: Yolonda Kida, MD;  Location: [facility] OR;  Service: Orthopedics;  Laterality: Left;  .  LUMBAR LAMINECTOMY/DECOMPRESSION MICRODISCECTOMY  04/06/2012   Procedure: LUMBAR LAMINECTOMY/DECOMPRESSION MICRODISCECTOMY;  Surgeon: Kerrin Champagne, MD;  Location: [facility] OR;  Service: Orthopedics;  Laterality: N/A;   Left L3-4 and L4-5 lateral recess decompression MIS  . NECK SURGERY  Sept 2005  . TOTAL ABDOMINAL HYSTERECTOMY  Oct 2003  . TUBAL LIGATION  1995     reports that she has been smoking cigarettes. She has a 30.00 pack-year smoking history. She has never used smokeless tobacco. She reports that she drinks alcohol. She reports that she does not use drugs.  Allergies  Allergen Reactions  . Amoxicillin-Pot Clavulanate Itching    Ok with benadryl  . Erythromycin Rash    Ok with benadryl   . Levofloxacin Itching    Ok with benadryl  . Moxifloxacin Itching    Reaction to Avelox - ok with benadryl  . Sulfamethoxazole-Trimethoprim Itching    Ok with benadryl    Family History  Problem Relation Age of Onset  . Emphysema Father   . Rheum arthritis Father   . Lung cancer Father   . Emphysema Mother     Prior to Admission medications   Medication Sig Start Date End Date Taking? Authorizing Provider  albuterol (PROVENTIL) (2.5 MG/3ML) 0.083% nebulizer solution Take 2.5 mg every 4 (four) hours as needed by nebulization for wheezing or shortness of breath.    Yes [provider]  azithromycin (ZITHROMAX) 250 MG tablet Take 250 mg daily by mouth. Continuous course   Yes [provider]  budesonide-formoterol (SYMBICORT) 160-4.5 MCG/ACT inhaler Inhale 2 puffs 2 (two) times daily into the lungs.   Yes [provider]  cetirizine (ZYRTEC) 10 MG tablet Take 10 mg daily by mouth.   Yes [provider]  fluticasone (FLONASE) 50 MCG/ACT nasal spray Place 1 spray daily into both nostrils.    Yes [provider]  gabapentin (NEURONTIN) 400 MG capsule Take 400 mg by mouth 3 (three) times daily. 05/03/18  Yes [provider]  Guaifenesin (MUCINEX MAXIMUM STRENGTH) 1200 MG TB12 Take 1,200 mg daily as needed by mouth (to thin phlegm/cough).   Yes [provider]  losartan (COZAAR) 100 MG tablet Take 100 mg daily by mouth.  12/04/11  Yes [provider]  montelukast (SINGULAIR) 10 MG tablet Take 10 mg at bedtime by mouth.   Yes [provider]  Morphine Sulfate ER (MORPHABOND ER) 30 MG T12A Take 30 mg 2 (two) times daily by mouth.   Yes [provider]  nystatin-triamcinolone ointment (MYCOLOG) Apply 1 application 2 (two) times daily as needed topically (rash).  04/15/17  Yes [provider]  oxyCODONE-acetaminophen (PERCOCET) 10-325 MG tablet Take 1 tablet by mouth 3 (three) times daily. 04/17/18  Yes [provider]  predniSONE (DELTASONE) 10 MG tablet Take 10 mg daily by mouth.   Yes [provider]  Tiotropium Bromide Monohydrate (SPIRIVA RESPIMAT) 2.5 MCG/ACT AERS Inhale 1 puff daily into the lungs.   Yes [provider]  Aclidinium Bromide (TUDORZA PRESSAIR) 400 MCG/ACT AEPB Inhale 1 puff into the lungs 2 (two) times daily. Patient not taking: Reported on 07/20/2017 04/29/12   Barbaraann Share, MD  OXYGEN Inhale 3-4 L continuous into the lungs.    [provider]    Physical Exam:  Constitutional: Obese female who appears to be in some respiratory distress Vitals:   05/19/18 1712 05/19/18 2013  BP: 137/90   Pulse: (!) 106   Resp: (!) 23   Temp: 98.5  F (36.9 C)   TempSrc: Oral   SpO2: 99% 95%   Eyes: PERRL, lids and conjunctivae normal ENMT: Mucous membranes are moist.  Nasal congestion with sinus tenderness.  Fluid present behind the bilateral tympanic membranes.  Posterior cobbling of the oropharynx noted.   Neck: normal, supple, no masses, no thyromegaly Respiratory: Tachypneic with expiratory wheezes appreciated in both lung fields with intermittent rhonchi. Patient currently on 4 L nasal cannula oxygen with O2 sats maintained.  Able to speak in short sentences.  Cardiovascular: Regular rate and rhythm, no murmurs / rubs / gallops. No extremity edema. 1+ pedal pulses. No carotid bruits.  Abdomen: no tenderness, no masses palpated. No hepatosplenomegaly.  Bowel sounds positive.  Musculoskeletal:  cyanosis present of the bilateral lower extremity. No joint deformity upper and lower extremities. Good ROM, no contractures. Normal muscle tone.  Skin: Vascular congestion Neurologic: CN 2-12 grossly intact. Sensation intact, DTR normal. Strength 5/5 in all 4.  Psychiatric: Normal judgment and insight. Alert and oriented x 3. Normal mood.     Labs on Admission: I have personally reviewed following labs and imaging studies  CBC: Recent Labs  Lab 05/19/18 1809  WBC 17.5*  NEUTROABS 16.6*  HGB 14.4  HCT 45.8  MCV 91.6  PLT 294   Basic Metabolic Panel: Recent Labs  Lab 05/19/18 1809  NA 139  K 4.8  CL 94*  CO2 34*  GLUCOSE 114*  BUN 7  CREATININE 0.54  CALCIUM 8.8*   GFR: CrCl cannot be calculated (Unknown ideal weight.). Liver Function Tests: Recent Labs  Lab 05/19/18 1809  AST 21  ALT 18  ALKPHOS 68  BILITOT 0.5  PROT 6.7  ALBUMIN 3.9   No results for input(s): LIPASE, AMYLASE in the last 168 hours. No results for input(s): AMMONIA in the last 168 hours. Coagulation Profile: No results for input(s): INR, PROTIME in the last 168 hours. Cardiac Enzymes: No results for input(s): CKTOTAL, CKMB, CKMBINDEX, TROPONINI in the last 168 hours. BNP (last 3 results) No results for input(s): PROBNP in the last 8760 hours. HbA1C: No results for input(s): HGBA1C in the last 72 hours. CBG: No results for input(s): GLUCAP in the last 168 hours. Lipid Profile: No results for input(s): CHOL, HDL, LDLCALC, TRIG, CHOLHDL, LDLDIRECT in the last 72 hours. Thyroid Function Tests: No results for input(s): TSH, T4TOTAL, FREET4, T3FREE, THYROIDAB in the last 72 hours. Anemia Panel: No results for input(s): VITAMINB12, FOLATE, FERRITIN, TIBC, IRON, RETICCTPCT in the last 72 hours. Urine analysis:    Component Value Date/Time   COLORURINE YELLOW 03/31/2012 1338   APPEARANCEUR CLEAR 03/31/2012 1338   LABSPEC 1.007 03/31/2012 1338    PHURINE 7.0 03/31/2012 1338   GLUCOSEU NEGATIVE 03/31/2012 1338   HGBUR NEGATIVE 03/31/2012 1338   BILIRUBINUR NEGATIVE 03/31/2012 1338   KETONESUR NEGATIVE 03/31/2012 1338   PROTEINUR NEGATIVE 03/31/2012 1338   UROBILINOGEN 0.2 03/31/2012 1338   NITRITE NEGATIVE 03/31/2012 1338   LEUKOCYTESUR NEGATIVE 03/31/2012 1338   Sepsis Labs: No results found for this or any previous visit (from the past 240 hour(s)).   Radiological Exams on Admission: Dg Chest 2 View  Result Date: 05/19/2018 CLINICAL DATA:  Shortness of Breath EXAM: CHEST - 2 VIEW COMPARISON:  July 20, 2017 FINDINGS: There is no edema or consolidation. The heart size and pulmonary vascularity are normal. No adenopathy. There is postoperative change in the proximal left humerus. IMPRESSION: No edema or consolidation. Electronically Signed   By: Bretta Bang III M.D.  On: 05/19/2018 19:44    Chest x-ray: Independently reviewed.  No clear signs of edema or infiltrate noted.  Assessment/Plan COPD exacerbation, chronic respiratory failure: Acute on chronic.  Patient presents with worsening shortness of breath and minimally productive cough.  Chest x-ray showing no consolidation or edema.  Patient currently on chronic steroids and recently treated with Levaquin and azithromycin.  Patient being followed by pulmonology at [facility] and appears to have wanted to perform a bronchoscopy looking for resistant bacteria.  Patient O2 saturations maintained on home 4 L. - Admit to a telemetry bed - Continuous pulse oximetry overnight with 4 L of nasal cannula oxygen - Check sputum culture and respiratory virus panel - Empiric antibiotics of cefepime for Pseudomonas coverage given chronic steroid use - Solu-Medrol 60 mg IV every 8 hours  - Albuterol nebs 4 times daily - Brovana and budesonide - Continue Spiriva, Claritin, Singulair, and Flonase - Mucinex - May want to discuss with patient's pulmonologist at [facility] as patient  scheduled to have bronchoscopy on 9/12  Leukocytosis WBC elevated at 17.5.  Patient chronically on steroids and reports recent sick contacts. - Recheck CBC in a.m  Chronic pain: Patient reports chronic back and neck pain.  Question if patient's pain medications pending her progress for frequent exacerbations. - Continue morphine sulfate and oxycodone as needed for pain  Essential hypertension - Continue losartan  Tobacco abuse: Patient still reports continue to smoke cigarettes intermittently. - Counseled on need cessation of tobacco abuse - Nicotine patch   Obesity: BMI 35.07  DVT prophylaxis: Lovenox Code Status: Full Family Communication: This plan of care with the patient and family present bedside Disposition Plan: Likely discharge home in 1 to 2 days Consults called: None Admission status: Observation  Clydie Braun MD [facility] Pager [phone]   If 7PM-7AM, please contact night-coverage [email or web address] Password [facility]  05/19/2018, 9:56 PM

## 2018-05-20 ENCOUNTER — Other Ambulatory Visit: Payer: Self-pay

## 2018-05-20 ENCOUNTER — Encounter (HOSPITAL_COMMUNITY): Payer: Self-pay

## 2018-05-20 DIAGNOSIS — J9621 Acute and chronic respiratory failure with hypoxia: Secondary | ICD-10-CM | POA: Diagnosis present

## 2018-05-20 DIAGNOSIS — G894 Chronic pain syndrome: Secondary | ICD-10-CM | POA: Diagnosis not present

## 2018-05-20 DIAGNOSIS — T380X5A Adverse effect of glucocorticoids and synthetic analogues, initial encounter: Secondary | ICD-10-CM | POA: Diagnosis present

## 2018-05-20 DIAGNOSIS — Z7952 Long term (current) use of systemic steroids: Secondary | ICD-10-CM | POA: Diagnosis not present

## 2018-05-20 DIAGNOSIS — Z7951 Long term (current) use of inhaled steroids: Secondary | ICD-10-CM | POA: Diagnosis not present

## 2018-05-20 DIAGNOSIS — J9611 Chronic respiratory failure with hypoxia: Secondary | ICD-10-CM | POA: Diagnosis present

## 2018-05-20 DIAGNOSIS — G8929 Other chronic pain: Secondary | ICD-10-CM | POA: Diagnosis present

## 2018-05-20 DIAGNOSIS — Z6835 Body mass index (BMI) 35.0-35.9, adult: Secondary | ICD-10-CM | POA: Diagnosis not present

## 2018-05-20 DIAGNOSIS — Z881 Allergy status to other antibiotic agents status: Secondary | ICD-10-CM | POA: Diagnosis not present

## 2018-05-20 DIAGNOSIS — Z79899 Other long term (current) drug therapy: Secondary | ICD-10-CM | POA: Diagnosis not present

## 2018-05-20 DIAGNOSIS — D72829 Elevated white blood cell count, unspecified: Secondary | ICD-10-CM | POA: Diagnosis not present

## 2018-05-20 DIAGNOSIS — M549 Dorsalgia, unspecified: Secondary | ICD-10-CM | POA: Diagnosis present

## 2018-05-20 DIAGNOSIS — J209 Acute bronchitis, unspecified: Secondary | ICD-10-CM | POA: Diagnosis present

## 2018-05-20 DIAGNOSIS — Z882 Allergy status to sulfonamides status: Secondary | ICD-10-CM | POA: Diagnosis not present

## 2018-05-20 DIAGNOSIS — Z79891 Long term (current) use of opiate analgesic: Secondary | ICD-10-CM | POA: Diagnosis not present

## 2018-05-20 DIAGNOSIS — J440 Chronic obstructive pulmonary disease with acute lower respiratory infection: Secondary | ICD-10-CM | POA: Diagnosis present

## 2018-05-20 DIAGNOSIS — Z23 Encounter for immunization: Secondary | ICD-10-CM | POA: Diagnosis present

## 2018-05-20 DIAGNOSIS — M542 Cervicalgia: Secondary | ICD-10-CM | POA: Diagnosis present

## 2018-05-20 DIAGNOSIS — M2550 Pain in unspecified joint: Secondary | ICD-10-CM | POA: Diagnosis present

## 2018-05-20 DIAGNOSIS — Z880 Allergy status to penicillin: Secondary | ICD-10-CM | POA: Diagnosis not present

## 2018-05-20 DIAGNOSIS — I10 Essential (primary) hypertension: Secondary | ICD-10-CM | POA: Diagnosis present

## 2018-05-20 DIAGNOSIS — B9789 Other viral agents as the cause of diseases classified elsewhere: Secondary | ICD-10-CM | POA: Diagnosis present

## 2018-05-20 DIAGNOSIS — Z720 Tobacco use: Secondary | ICD-10-CM | POA: Diagnosis not present

## 2018-05-20 DIAGNOSIS — J441 Chronic obstructive pulmonary disease with (acute) exacerbation: Secondary | ICD-10-CM | POA: Diagnosis present

## 2018-05-20 DIAGNOSIS — Z9981 Dependence on supplemental oxygen: Secondary | ICD-10-CM | POA: Diagnosis not present

## 2018-05-20 DIAGNOSIS — F17210 Nicotine dependence, cigarettes, uncomplicated: Secondary | ICD-10-CM | POA: Diagnosis present

## 2018-05-20 DIAGNOSIS — E669 Obesity, unspecified: Secondary | ICD-10-CM | POA: Diagnosis present

## 2018-05-20 LAB — RESPIRATORY PANEL BY PCR
Adenovirus: NOT DETECTED
BORDETELLA PERTUSSIS-RVPCR: NOT DETECTED
Chlamydophila pneumoniae: NOT DETECTED
Coronavirus 229E: NOT DETECTED
Coronavirus HKU1: NOT DETECTED
Coronavirus NL63: NOT DETECTED
Coronavirus OC43: NOT DETECTED
INFLUENZA A-RVPPCR: NOT DETECTED
INFLUENZA B-RVPPCR: NOT DETECTED
METAPNEUMOVIRUS-RVPPCR: NOT DETECTED
Mycoplasma pneumoniae: NOT DETECTED
PARAINFLUENZA VIRUS 2-RVPPCR: NOT DETECTED
PARAINFLUENZA VIRUS 3-RVPPCR: NOT DETECTED
Parainfluenza Virus 1: NOT DETECTED
Parainfluenza Virus 4: NOT DETECTED
Respiratory Syncytial Virus: NOT DETECTED
Rhinovirus / Enterovirus: DETECTED — AB

## 2018-05-20 LAB — BASIC METABOLIC PANEL
Anion gap: 8 (ref 5–15)
BUN: 8 mg/dL (ref 6–20)
CALCIUM: 8.9 mg/dL (ref 8.9–10.3)
CO2: 35 mmol/L — ABNORMAL HIGH (ref 22–32)
CREATININE: 0.48 mg/dL (ref 0.44–1.00)
Chloride: 95 mmol/L — ABNORMAL LOW (ref 98–111)
GFR calc Af Amer: >60 mL/min (ref >60)
GFR calc non Af Amer: >60 mL/min (ref >60)
Glucose, Bld: 165 mg/dL — ABNORMAL HIGH (ref 70–99)
Potassium: 4.8 mmol/L (ref 3.5–5.1)
SODIUM: 138 mmol/L (ref 135–145)

## 2018-05-20 LAB — CBC
HCT: 45.9 % (ref 36.0–46.0)
Hemoglobin: 14.1 g/dL (ref 12.0–15.0)
MCH: 28.3 pg (ref 26.0–34.0)
MCHC: 30.7 g/dL (ref 30.0–36.0)
MCV: 92 fL (ref 78.0–100.0)
Platelets: 299 10*3/uL (ref 150–400)
RBC: 4.99 MIL/uL (ref 3.87–5.11)
RDW: 14.4 % (ref 11.5–15.5)
WBC: 12.3 10*3/uL — AB (ref 4.0–10.5)

## 2018-05-20 MED ORDER — ALBUTEROL SULFATE (2.5 MG/3ML) 0.083% IN NEBU
2.5000 mg | INHALATION_SOLUTION | Freq: Four times a day (QID) | RESPIRATORY_TRACT | Status: DC
  Administered 2018-05-21 – 2018-05-23 (×10): 2.5 mg via RESPIRATORY_TRACT
  Filled 2018-05-20 (×9): qty 3

## 2018-05-20 MED ORDER — ALBUTEROL SULFATE (2.5 MG/3ML) 0.083% IN NEBU
2.5000 mg | INHALATION_SOLUTION | RESPIRATORY_TRACT | Status: DC
  Administered 2018-05-20 (×2): 2.5 mg via RESPIRATORY_TRACT
  Filled 2018-05-20 (×2): qty 3

## 2018-05-20 MED ORDER — SODIUM CHLORIDE 0.9 % IV SOLN
INTRAVENOUS | Status: DC | PRN
  Administered 2018-05-20: 250 mL via INTRAVENOUS

## 2018-05-20 MED ORDER — INFLUENZA VAC SPLIT QUAD 0.5 ML IM SUSY
0.5000 mL | PREFILLED_SYRINGE | INTRAMUSCULAR | Status: AC
  Administered 2018-05-21: 0.5 mL via INTRAMUSCULAR
  Filled 2018-05-20: qty 0.5

## 2018-05-20 MED ORDER — POLYETHYLENE GLYCOL 3350 17 G PO PACK
17.0000 g | PACK | Freq: Every day | ORAL | Status: DC
  Administered 2018-05-20 – 2018-05-23 (×4): 17 g via ORAL
  Filled 2018-05-20 (×4): qty 1

## 2018-05-20 NOTE — Progress Notes (Signed)
PROGRESS NOTE [facility]   [name]   ZOX:[id number] DOB: 10/10/1965  DOA: 05/19/2018 PCP: [name], MD   Brief Narrative:  [name] 52 year old female with medical history significant for hypertension, COPD oxygen dependent on 4 L, chronic back pain and GERD presented to the emergency department complaining of worsening shortness of breath and cough.  Patient dealing with symptoms over several weeks but acutely worse over the past 7 days prior to admission.  Patient has been seen pulmonology at [facility] where she was scheduled for bronchoscopy to be performed on 9/12.  Upon ED evaluation patient was found to be afebrile with severe cough, dyspneic, Solu-Medrol and duo nebs were given without symptom relief.  Chest x-ray showed no edema or consolidation, labs revealed WBC of 17.5, otherwise unremarkable.  Patient was admitted with working diagnosis of COPD exacerbation.  Subjective: Patient seen and examined.  Patient easily short of breath with minimal movement.  Oxygen supplementation remains at baseline.  She is coughing but unable to bring sputum up.  Afebrile.  Vital panel positive for rhinovirus.  Assessment & Plan: COPD exacerbation due to rhinovirus, active smoker Patient been treated with Solu-Medrol, Brovana Pulmicort and albuterol as needed.  Patient some improvement from yesterday however remains dyspneic with minimal movement.  Will continue current treatment with IV steroids and nebulizer treatment.  Discontinue antibiotic therapy.  I have discussed case with her pulmonologist at [facility] health who will cancel bronchoscopy until patient has recovered from exacerbation.  Will add Mucinex.  Continue incentive spirometry and oxygen supplementation as needed to keep O2 sat above 88%.  Leukocytosis Likely from viral infection and chronic steroid use.  WBC trending down  Chronic back pain Continue home regimen with morphine and oxycodone as  needed  Hypertension BP stable Continue losartan  Tobacco abuse Cessation discussed, nicotine patch.  DVT prophylaxis: Lovenox Code Status: Full code Family Communication: Husband at bedside Disposition Plan: Home in 1 to 2 days if respiratory status improved.  Consultants:   None  Procedures:   None  Antimicrobials: Anti-infectives (From admission, onward)   Start     Dose/Rate Route Frequency Ordered Stop   05/20/18 0600  ceFEPIme (MAXIPIME) 1 g in sodium chloride 0.9 % 100 mL IVPB     1 g 200 mL/hr over 30 Minutes Intravenous Every 8 hours 05/19/18 2305 05/25/18 0559   05/19/18 2330  ceFEPIme (MAXIPIME) 2 g in sodium chloride 0.9 % 100 mL IVPB     2 g 200 mL/hr over 30 Minutes Intravenous  Once 05/19/18 2319 05/20/18 0209          Objective: Vitals:   05/19/18 2357 05/20/18 0013 05/20/18 0518 05/20/18 0845  BP:  (!) 157/98 (!) 157/106   Pulse:  95 85   Resp:  (!) 22 20   Temp: 98.2 F (36.8 C) 98 F (36.7 C) 98.3 F (36.8 C)   TempSrc: Oral Oral Oral   SpO2:  95% 97% 95%  Weight:  89.8 kg    Height:  5\' 3"  (1.6 m)      Intake/Output Summary (Last 24 hours) at 05/20/2018 1603 Last data filed at 05/20/2018 1549 Gross per 24 hour  Intake 927.41 ml  Output -  Net 927.41 ml   Filed Weights   05/20/18 0013  Weight: 89.8 kg    Examination:  General exam: Appears calm and comfortable  HEENT: OP clear, poor dentition Respiratory system: Breath sounds diminished bilaterally, diffuse rhonchi and wheezes O2 [place]  4 L Cardiovascular system: S1 & S2 heard, RRR. No JVD, murmurs, rubs or gallops Gastrointestinal system: Abdomen is nondistended, soft and nontender. \ Central nervous system: Alert and oriented. No focal neurological deficits. Extremities: No pedal edema. Skin: No rashes Psychiatry: Mood & affect appropriate.    Data Reviewed: I have personally reviewed following labs and imaging studies  CBC: Recent Labs  Lab 05/19/18 1809 05/20/18 0432   WBC 17.5* 12.3*  NEUTROABS 16.6*  --   HGB 14.4 14.1  HCT 45.8 45.9  MCV 91.6 92.0  PLT 294 299   Basic Metabolic Panel: Recent Labs  Lab 05/19/18 1809 05/20/18 0432  NA 139 138  K 4.8 4.8  CL 94* 95*  CO2 34* 35*  GLUCOSE 114* 165*  BUN 7 8  CREATININE 0.54 0.48  CALCIUM 8.8* 8.9   GFR: Estimated Creatinine Clearance: 87.5 mL/min (by C-G formula based on SCr of 0.48 mg/dL). Liver Function Tests: Recent Labs  Lab 05/19/18 1809  AST 21  ALT 18  ALKPHOS 68  BILITOT 0.5  PROT 6.7  ALBUMIN 3.9   No results for input(s): LIPASE, AMYLASE in the last 168 hours. No results for input(s): AMMONIA in the last 168 hours. Coagulation Profile: No results for input(s): INR, PROTIME in the last 168 hours. Cardiac Enzymes: No results for input(s): CKTOTAL, CKMB, CKMBINDEX, TROPONINI in the last 168 hours. BNP (last 3 results) No results for input(s): PROBNP in the last 8760 hours. HbA1C: No results for input(s): HGBA1C in the last 72 hours. CBG: No results for input(s): GLUCAP in the last 168 hours. Lipid Profile: No results for input(s): CHOL, HDL, LDLCALC, TRIG, CHOLHDL, LDLDIRECT in the last 72 hours. Thyroid Function Tests: No results for input(s): TSH, T4TOTAL, FREET4, T3FREE, THYROIDAB in the last 72 hours. Anemia Panel: No results for input(s): VITAMINB12, FOLATE, FERRITIN, TIBC, IRON, RETICCTPCT in the last 72 hours. Sepsis Labs: No results for input(s): PROCALCITON, LATICACIDVEN in the last 168 hours.  Recent Results (from the past 240 hour(s))  Respiratory Panel by PCR     Status: Abnormal   Collection Time: 05/20/18  1:32 AM  Result Value Ref Range Status   Adenovirus NOT DETECTED NOT DETECTED Final   Coronavirus 229E NOT DETECTED NOT DETECTED Final   Coronavirus HKU1 NOT DETECTED NOT DETECTED Final   Coronavirus NL63 NOT DETECTED NOT DETECTED Final   Coronavirus OC43 NOT DETECTED NOT DETECTED Final   Metapneumovirus NOT DETECTED NOT DETECTED Final    Rhinovirus / Enterovirus DETECTED (A) NOT DETECTED Final   Influenza A NOT DETECTED NOT DETECTED Final   Influenza B NOT DETECTED NOT DETECTED Final   Parainfluenza Virus 1 NOT DETECTED NOT DETECTED Final   Parainfluenza Virus 2 NOT DETECTED NOT DETECTED Final   Parainfluenza Virus 3 NOT DETECTED NOT DETECTED Final   Parainfluenza Virus 4 NOT DETECTED NOT DETECTED Final   Respiratory Syncytial Virus NOT DETECTED NOT DETECTED Final   Bordetella pertussis NOT DETECTED NOT DETECTED Final   Chlamydophila pneumoniae NOT DETECTED NOT DETECTED Final   Mycoplasma pneumoniae NOT DETECTED NOT DETECTED Final    Comment: Performed at [facility], [address], [place], [place] [address]      Radiology Studies: Dg Chest 2 View  Result Date: 05/19/2018 CLINICAL DATA:  Shortness of Breath EXAM: CHEST - 2 VIEW COMPARISON:  July 20, 2017 FINDINGS: There is no edema or consolidation. The heart size and pulmonary vascularity are normal. No adenopathy. There is postoperative change in the proximal  left humerus. IMPRESSION: No edema or consolidation. Electronically Signed   By: Bretta Bang III M.D.   On: 05/19/2018 19:44      Scheduled Meds: . albuterol  2.5 mg Nebulization Q4H  . arformoterol  15 mcg Nebulization BID  . budesonide (PULMICORT) nebulizer solution  0.5 mg Nebulization BID  . enoxaparin (LOVENOX) injection  40 mg Subcutaneous QHS  . fluticasone  1 spray Each Nare Daily  . gabapentin  400 mg Oral TID  . guaiFENesin  600 mg Oral BID  . [START ON 05/21/2018] Influenza vac split quadrivalent PF  0.5 mL Intramuscular Tomorrow-1000  . loratadine  10 mg Oral Daily  . losartan  100 mg Oral Daily  . methylPREDNISolone (SOLU-MEDROL) injection  60 mg Intravenous Q8H  . montelukast  10 mg Oral QHS  . morphine  30 mg Oral BID  . nicotine  21 mg Transdermal Daily  . sodium chloride flush  3 mL Intravenous Q12H  . sodium chloride flush  3 mL Intravenous Q12H  . tiotropium  18 mcg  Inhalation Daily   Continuous Infusions: . sodium chloride Stopped (05/20/18 0628)  . ceFEPime (MAXIPIME) IV 1 g (05/20/18 1258)     LOS: 0 days    Time spent: Total of 35 minutes spent with pt, greater than 50% of which was spent in discussion of  treatment, counseling and coordination of care   Latrelle Dodrill, MD Pager: Text Page via [email or web address]   If 7PM-7AM, please contact night-coverage [email or web address] 05/20/2018, 4:03 PM   Note - This record has been created using [employer]. Chart creation errors have been sought, but may not always have been located. Such creation errors do not reflect on the standard of medical care.

## 2018-05-20 NOTE — Evaluation (Signed)
Occupational Therapy Evaluation Patient Details Name: [name] MRN: [id number] DOB: 06/29/1966 Today's Date: 05/20/2018    History of Present Illness 52 y.o. female with medical history significant of HTN, COPD oxygen dependent on 4 L, chronic pain, cervical and lumbar surgeries, and GERD and admitted for COPD exacerbation   Clinical Impression   Pt was admitted for the above.  Educated on energy conservation this session. Pt had a busy day and was fatiqued so evaluation performed at bed level.  Pt needed assistance with LB adls at baseline; she "furniture walked" and has a h/o fall with UE fx. Encouraged use of RW. Will continue to follow in acute setting with mod I level goal for toileting and further education on energy conservation    Follow Up Recommendations  Supervision/Assistance - 24 hour    Equipment Recommendations  None recommended by OT(likely)    Recommendations for Other Services       Precautions / Restrictions Precautions Precautions: Fall Precaution Comments: chronic oxygen, monitor sats Restrictions Weight Bearing Restrictions: No      Mobility Bed Mobility Overal bed mobility: Needs Assistance Bed Mobility: Supine to Sit     Supine to sit: Supervision;HOB elevated        Transfers Overall transfer level: Needs assistance Equipment used: Rolling walker (2 wheeled) Transfers: Sit to/from Stand Sit to Stand: Min guard         General transfer comment: performed by PT    Balance Overall balance assessment: Needs assistance         Standing balance support: Bilateral upper extremity supported Standing balance-Leahy Scale: Poor Standing balance comment: utilized RW today for mobility due to fatigue and L LE chronic pain; pt denies recent falls                           ADL either performed or assessed with clinical judgement   ADL Overall ADL's : Needs assistance/impaired                                        General ADL Comments: pt's husband assists her with LB adls (mod to max A LB dressing) especially shoes and socks.  She is familiar with AE, but this has not worked well for her in the past.  Educated on energy conservation and gave her handouts.   She is near baseline for adls. Pt reports that she "furniture walked" to bathroom but she has had a fall resulting in LUE fx in the past; hip "gave". Encouraged use of RW, which she has     Financial controller      Pertinent Vitals/Pain Pain Assessment: No/denies pain     Hand Dominance     Extremity/Trunk Assessment Upper Extremity Assessment Upper Extremity Assessment: Overall WFL for tasks assessed(h/o LUE surgery from fall)          Communication Communication Communication: No difficulties   Cognition Arousal/Alertness: Awake/alert Behavior During Therapy: WFL for tasks assessed/performed Overall Cognitive Status: Within Functional Limits for tasks assessed                                     General Comments       Exercises  Shoulder Instructions      Home Living Family/patient expects to be discharged to:: Private residence Living Arrangements: Spouse/significant other Available Help at Discharge: Family               Bathroom Shower/Tub: Walk-in Human resources officer: Standard     Home Equipment: Environmental consultant - 2 wheels;Shower seat;Grab bars - toilet;Hand held shower head   Additional Comments: husband uses a cane and has a boot      Prior Functioning/Environment Level of Independence: Independent        Comments: dyspnea with exertion at baseline, chronic 4L O2 [place], reports limited ambulation distance however typically does not use AD        OT Problem List: Decreased strength;Impaired balance (sitting and/or standing);Decreased activity tolerance      OT Treatment/Interventions: Self-care/ADL training;DME and/or AE instruction;Patient/family  education;Energy conservation    OT Goals(Current goals can be found in the care plan section) Acute Rehab OT Goals Patient Stated Goal: none stated OT Goal Formulation: With patient Time For Goal Achievement: 06/03/18 Potential to Achieve Goals: Good ADL Goals Pt Will Transfer to Toilet: with modified independence;ambulating;regular height toilet Additional ADL Goal #1: pt will initiate at least one rest break during adls and verbalize 3 energy conservation strategies  OT Frequency: Min 2X/week   Barriers to D/C:            Co-evaluation              AM-PAC PT "6 Clicks" Daily Activity     Outcome Measure Help from another person eating meals?: None Help from another person taking care of personal grooming?: A Little Help from another person toileting, which includes using toliet, bedpan, or urinal?: A Little Help from another person bathing (including washing, rinsing, drying)?: A Little Help from another person to put on and taking off regular upper body clothing?: A Little Help from another person to put on and taking off regular lower body clothing?: A Lot 6 Click Score: 18   End of Session    Activity Tolerance: Patient limited by fatigue Patient left: in bed;with call bell/phone within reach;with bed alarm set  OT Visit Diagnosis: Muscle weakness (generalized) (M62.81);Unsteadiness on feet (R26.81)                Time: 1610-9604 OT Time Calculation (min): 8 min Charges:  OT General Charges $OT Visit: 1 Visit OT Evaluation $OT Eval Low Complexity: 1 Low  Marica Otter, OTR/L Acute Rehabilitation Services [phone] [facility] pager [phone] office 05/20/2018  Ermine Spofford 05/20/2018, 4:21 PM

## 2018-05-20 NOTE — Evaluation (Signed)
Physical Therapy Evaluation Patient Details Name: [name] MRN: [id number] DOB: Nov 20, 1965 Today's Date: 05/20/2018   History of Present Illness  52 y.o. female with medical history significant of HTN, COPD oxygen dependent on 4 L, chronic pain, cervical and lumbar surgeries, and GERD and admitted for COPD exacerbation  Clinical Impression  Pt admitted with above diagnosis. Pt currently with functional limitations due to the deficits listed below (see PT Problem List).  Pt will benefit from skilled PT to increase their independence and safety with mobility to allow discharge to the venue listed below.  Pt assisted with ambulating short distance in hallway. Pt reports limited tolerance to mobility at baseline and typically on 4L O2 (states she does not increase for exertion).  Pt may benefit from OP PT, if agreeable, to improve endurance.     Follow Up Recommendations Outpatient PT    Equipment Recommendations  None recommended by PT    Recommendations for Other Services       Precautions / Restrictions Precautions Precautions: Fall Precaution Comments: chronic oxygen, monitor sats      Mobility  Bed Mobility Overal bed mobility: Needs Assistance Bed Mobility: Supine to Sit     Supine to sit: Supervision;HOB elevated        Transfers Overall transfer level: Needs assistance Equipment used: Rolling walker (2 wheeled) Transfers: Sit to/from Stand Sit to Stand: Min guard            Ambulation/Gait Ambulation/Gait assistance: Min guard Gait Distance (Feet): 55 Feet Assistive device: Rolling walker (2 wheeled) Gait Pattern/deviations: Step-through pattern;Decreased stride length Gait velocity: decr   General Gait Details: min/guard for safety, monitored SPO2, mostly 93% on 6L O2 [place], pt reports moderate dyspnea, recliner following for fatigue  Stairs            Wheelchair Mobility    Modified Rankin (Stroke Patients Only)       Balance Overall  balance assessment: Needs assistance         Standing balance support: Bilateral upper extremity supported Standing balance-Leahy Scale: Poor Standing balance comment: utilized RW today for mobility due to fatigue and L LE chronic pain; pt denies recent falls                             Pertinent Vitals/Pain Pain Assessment: No/denies pain    Home Living Family/patient expects to be discharged to:: Private residence Living Arrangements: Spouse/significant other Available Help at Discharge: Family Type of Home: House Home Access: Stairs to enter Entrance Stairs-Rails: Right;Left;Can reach both Entrance Stairs-Number of Steps: 3-4 Home Layout: Able to live on main level with bedroom/bathroom Home Equipment: Walker - 2 wheels;Shower seat;Grab bars - toilet;Hand held shower head      Prior Function Level of Independence: Independent         Comments: dyspnea with exertion at baseline, chronic 4L O2 [place], reports limited ambulation distance however typically does not use AD     Hand Dominance        Extremity/Trunk Assessment        Lower Extremity Assessment Lower Extremity Assessment: Overall WFL for tasks assessed;LLE deficits/detail LLE Deficits / Details: reports chronic hip/knee leg pain       Communication   Communication: No difficulties  Cognition Arousal/Alertness: Awake/alert Behavior During Therapy: WFL for tasks assessed/performed Overall Cognitive Status: Within Functional Limits for tasks assessed  General Comments      Exercises     Assessment/Plan    PT Assessment Patient needs continued PT services  PT Problem List Decreased strength;Decreased mobility;Decreased activity tolerance;Decreased knowledge of use of DME;Cardiopulmonary status limiting activity       PT Treatment Interventions DME instruction;Therapeutic activities;Gait training;Therapeutic  exercise;Patient/family education;Functional mobility training    PT Goals (Current goals can be found in the Care Plan section)  Acute Rehab PT Goals PT Goal Formulation: With patient Time For Goal Achievement: 06/03/18 Potential to Achieve Goals: Good    Frequency Min 3X/week   Barriers to discharge        Co-evaluation               AM-PAC PT "6 Clicks" Daily Activity  Outcome Measure Difficulty turning over in bed (including adjusting bedclothes, sheets and blankets)?: None Difficulty moving from lying on back to sitting on the side of the bed? : None Difficulty sitting down on and standing up from a chair with arms (e.g., wheelchair, bedside commode, etc,.)?: A Little Help needed moving to and from a bed to chair (including a wheelchair)?: A Little Help needed walking in hospital room?: A Little Help needed climbing 3-5 steps with a railing? : A Lot 6 Click Score: 19    End of Session Equipment Utilized During Treatment: Gait belt Activity Tolerance: Patient tolerated treatment well Patient left: in chair;with chair alarm set;with call bell/phone within reach;with family/visitor present Nurse Communication: Mobility status PT Visit Diagnosis: Difficulty in walking, not elsewhere classified (R26.2)    Time: 8657-8469 PT Time Calculation (min) (ACUTE ONLY): 12 min   Charges:   PT Evaluation $PT Eval Low Complexity: 1 Low         Zenovia Jarred, PT, DPT Acute Rehabilitation Services Office: [phone] Pager: [phone]  Sarajane Jews 05/20/2018, 12:55 PM

## 2018-05-21 LAB — MAGNESIUM: Magnesium: 2 mg/dL (ref 1.7–2.4)

## 2018-05-21 LAB — BASIC METABOLIC PANEL
ANION GAP: 9 (ref 5–15)
BUN: 12 mg/dL (ref 6–20)
CALCIUM: 8.7 mg/dL — AB (ref 8.9–10.3)
CHLORIDE: 95 mmol/L — AB (ref 98–111)
CO2: 35 mmol/L — AB (ref 22–32)
Creatinine, Ser: 0.57 mg/dL (ref 0.44–1.00)
GFR calc Af Amer: >60 mL/min (ref >60)
GFR calc non Af Amer: >60 mL/min (ref >60)
GLUCOSE: 116 mg/dL — AB (ref 70–99)
Potassium: 4.3 mmol/L (ref 3.5–5.1)
Sodium: 139 mmol/L (ref 135–145)

## 2018-05-21 MED ORDER — PANTOPRAZOLE SODIUM 40 MG PO TBEC
40.0000 mg | DELAYED_RELEASE_TABLET | Freq: Every day | ORAL | Status: DC
Start: 2018-05-21 — End: 2018-05-23
  Administered 2018-05-21 – 2018-05-23 (×3): 40 mg via ORAL
  Filled 2018-05-21 (×3): qty 1

## 2018-05-21 MED ORDER — TIOTROPIUM BROMIDE MONOHYDRATE 18 MCG IN CAPS
18.0000 ug | ORAL_CAPSULE | Freq: Every day | RESPIRATORY_TRACT | Status: DC
  Administered 2018-05-22 – 2018-05-23 (×2): 18 ug via RESPIRATORY_TRACT

## 2018-05-21 MED ORDER — METHYLPREDNISOLONE SODIUM SUCC 125 MG IJ SOLR
60.0000 mg | Freq: Two times a day (BID) | INTRAMUSCULAR | Status: DC
  Administered 2018-05-22: 60 mg via INTRAVENOUS
  Filled 2018-05-21: qty 2

## 2018-05-21 MED ORDER — FLUOXETINE HCL 20 MG PO CAPS
40.0000 mg | ORAL_CAPSULE | Freq: Every day | ORAL | Status: DC
  Administered 2018-05-21 – 2018-05-23 (×3): 40 mg via ORAL
  Filled 2018-05-21 (×3): qty 2

## 2018-05-21 MED ORDER — GUAIFENESIN-DM 100-10 MG/5ML PO SYRP
5.0000 mL | ORAL_SOLUTION | ORAL | Status: DC | PRN
  Administered 2018-05-21 – 2018-05-23 (×10): 5 mL via ORAL
  Filled 2018-05-21 (×10): qty 10

## 2018-05-21 NOTE — Progress Notes (Signed)
PROGRESS NOTE [facility]   Nichole RanchDrinda S Lipps   ZOX:[id number] DOB: 10/30/1965  DOA: 05/19/2018 PCP: [name], Michael C, MD   Brief Narrative:  [name] 52 year old female with medical history significant for hypertension, COPD oxygen dependent on 4 L, chronic back pain and GERD presented to the emergency department complaining of worsening shortness of breath and cough.  Patient dealing with symptoms over several weeks but acutely worse over the past 7 days prior to admission.  Patient has [facility] where she was scheduled for bronchoscopy to be performed on 9/12.  Upon ED evaluation patient was found to be afebrile with severe cough, dyspneic, Solu-Medrol and duo nebs were given without symptom relief.  Chest x-ray showed no edema or consolidation, labs revealed WBC of 17.5, otherwise unremarkable.  Patient was admitted with working diagnosis of COPD exacerbation.  Subjective: Patient seen and examined, report her breathing improved some from yesterday, but still coughing and with significant shortness of breath with minimal activity.  Denies chest pain, palpitations and dizziness.  Assessment & Plan: COPD exacerbation due to rhinovirus, active smoker Currently on Solu-Medrol, Brovana, Pulmicort and albuterol.  Will start tapering Solu-Medrol, oxygen supplementation currently at baseline.  Continue Brovana and Pulmicort.  Continue Robitussin.  If respiratory status remains stable can switch to oral prednisone and possible discharge in a.m.  Keep O2 sat between 88% and 93%.  Leukocytosis Felt to be secondary to viral infection and chronic steroid use.  WBC trending down  Chronic back pain Continue home regimen with morphine and oxycodone as needed  Hypertension BP stable Continue losartan  Tobacco abuse Cessation discussed, nicotine patch.  DVT prophylaxis: Lovenox Code Status: Full code Family Communication: Husband at bedside Disposition  Plan: Home in 1 to 2 days if respiratory status improved.  Consultants:   None  Procedures:   None  Antimicrobials: Anti-infectives (From admission, onward)   Start     Dose/Rate Route Frequency Ordered Stop   05/20/18 0600  ceFEPIme (MAXIPIME) 1 g in sodium chloride 0.9 % 100 mL IVPB     1 g 200 mL/hr over 30 Minutes Intravenous Every 8 hours 05/19/18 2305 05/25/18 0559   05/19/18 2330  ceFEPIme (MAXIPIME) 2 g in sodium chloride 0.9 % 100 mL IVPB     2 g 200 mL/hr over 30 Minutes Intravenous  Once 05/19/18 2319 05/20/18 0209         Objective: Vitals:   05/20/18 2041 05/21/18 0639 05/21/18 0921 05/21/18 1334  BP:  (!) 166/99  137/73  Pulse:  86  80  Resp:  (!) 24  18  Temp:  98.1 F (36.7 C)  98 F (36.7 C)  TempSrc:  Oral  Oral  SpO2: 96% 96% 95% 98%  Weight:      Height:        Intake/Output Summary (Last 24 hours) at 05/21/2018 1518 Last data filed at 05/21/2018 1500 Gross per 24 hour  Intake 3050.9 ml  Output -  Net 3050.9 ml   Filed Weights   05/20/18 0013  Weight: 89.8 kg    Examination:  General: Pt is alert, awake, not in acute distress Cardiovascular: RRR, S1/S2 +, no rubs, no gallops Respiratory: Some improving air entry but still tight and wheezy.  Rhonchi throughout. Abdominal: Soft, NT, ND, bowel sounds + Extremities: no edema, no cyanosis  Skin: No rashes Neurology: Nonfocal  Data Reviewed: I have personally reviewed following labs and imaging studies  CBC: Recent Labs  Lab  05/19/18 1809 05/20/18 0432  WBC 17.5* 12.3*  NEUTROABS 16.6*  --   HGB 14.4 14.1  HCT 45.8 45.9  MCV 91.6 92.0  PLT 294 299   Basic Metabolic Panel: Recent Labs  Lab 05/19/18 1809 05/20/18 0432 05/21/18 0400  NA 139 138 139  K 4.8 4.8 4.3  CL 94* 95* 95*  CO2 34* 35* 35*  GLUCOSE 114* 165* 116*  BUN 7 8 12   CREATININE 0.54 0.48 0.57  CALCIUM 8.8* 8.9 8.7*  MG  --   --  2.0   GFR: Estimated Creatinine Clearance: 87.5 mL/min (by C-G formula  based on SCr of 0.57 mg/dL). Liver Function Tests: Recent Labs  Lab 05/19/18 1809  AST 21  ALT 18  ALKPHOS 68  BILITOT 0.5  PROT 6.7  ALBUMIN 3.9   No results for input(s): LIPASE, AMYLASE in the last 168 hours. No results for input(s): AMMONIA in the last 168 hours. Coagulation Profile: No results for input(s): INR, PROTIME in the last 168 hours. Cardiac Enzymes: No results for input(s): CKTOTAL, CKMB, CKMBINDEX, TROPONINI in the last 168 hours. BNP (last 3 results) No results for input(s): PROBNP in the last 8760 hours. HbA1C: No results for input(s): HGBA1C in the last 72 hours. CBG: No results for input(s): GLUCAP in the last 168 hours. Lipid Profile: No results for input(s): CHOL, HDL, LDLCALC, TRIG, CHOLHDL, LDLDIRECT in the last 72 hours. Thyroid Function Tests: No results for input(s): TSH, T4TOTAL, FREET4, T3FREE, THYROIDAB in the last 72 hours. Anemia Panel: No results for input(s): VITAMINB12, FOLATE, FERRITIN, TIBC, IRON, RETICCTPCT in the last 72 hours. Sepsis Labs: No results for input(s): PROCALCITON, LATICACIDVEN in the last 168 hours.  Recent Results (from the past 240 hour(s))  Respiratory Panel by PCR     Status: Abnormal   Collection Time: 05/20/18  1:32 AM  Result Value Ref Range Status   Adenovirus NOT DETECTED NOT DETECTED Final   Coronavirus 229E NOT DETECTED NOT DETECTED Final   Coronavirus HKU1 NOT DETECTED NOT DETECTED Final   Coronavirus NL63 NOT DETECTED NOT DETECTED Final   Coronavirus OC43 NOT DETECTED NOT DETECTED Final   Metapneumovirus NOT DETECTED NOT DETECTED Final   Rhinovirus / Enterovirus DETECTED (A) NOT DETECTED Final   Influenza A NOT DETECTED NOT DETECTED Final   Influenza B NOT DETECTED NOT DETECTED Final   Parainfluenza Virus 1 NOT DETECTED NOT DETECTED Final   Parainfluenza Virus 2 NOT DETECTED NOT DETECTED Final   Parainfluenza Virus 3 NOT DETECTED NOT DETECTED Final   Parainfluenza Virus 4 NOT DETECTED NOT DETECTED Final    Respiratory Syncytial Virus NOT DETECTED NOT DETECTED Final   Bordetella pertussis NOT DETECTED NOT DETECTED Final   Chlamydophila pneumoniae NOT DETECTED NOT DETECTED Final   Mycoplasma pneumoniae NOT DETECTED NOT DETECTED Final    Comment: Performed at [facility], [address], [place], [place] [address]      Radiology Studies: Dg Chest 2 View  Result Date: 05/19/2018 CLINICAL DATA:  Shortness of Breath EXAM: CHEST - 2 VIEW COMPARISON:  July 20, 2017 FINDINGS: There is no edema or consolidation. The heart size and pulmonary vascularity are normal. No adenopathy. There is postoperative change in the proximal left humerus. IMPRESSION: No edema or consolidation. Electronically Signed   By: Bretta Bang III M.D.   On: 05/19/2018 19:44      Scheduled Meds: . albuterol  2.5 mg Nebulization Q6H  . arformoterol  15 mcg Nebulization BID  . budesonide (  PULMICORT) nebulizer solution  0.5 mg Nebulization BID  . enoxaparin (LOVENOX) injection  40 mg Subcutaneous QHS  . fluticasone  1 spray Each Nare Daily  . gabapentin  400 mg Oral TID  . loratadine  10 mg Oral Daily  . losartan  100 mg Oral Daily  . methylPREDNISolone (SOLU-MEDROL) injection  60 mg Intravenous Q8H  . montelukast  10 mg Oral QHS  . morphine  30 mg Oral BID  . nicotine  21 mg Transdermal Daily  . pantoprazole  40 mg Oral Daily  . polyethylene glycol  17 g Oral Daily  . sodium chloride flush  3 mL Intravenous Q12H  . sodium chloride flush  3 mL Intravenous Q12H  . [START ON 05/22/2018] tiotropium  18 mcg Inhalation Daily   Continuous Infusions: . sodium chloride Stopped (05/20/18 0628)  . ceFEPime (MAXIPIME) IV Stopped (05/21/18 1438)     LOS: 1 day    Time spent: Total of 25 minutes spent with pt, greater than 50% of which was spent in discussion of  treatment, counseling and coordination of care   Latrelle Dodrill, MD Pager: Text Page via [email or web address]   If 7PM-7AM, please contact  night-coverage [email or web address] 05/21/2018, 3:18 PM   Note - This record has been created using [employer]. Chart creation errors have been sought, but may not always have been located. Such creation errors do not reflect on the standard of medical care.

## 2018-05-21 NOTE — Progress Notes (Signed)
OT Cancellation Note  Patient Details Name: Nichole RanchDrinda S Morrison MRN: [id number] DOB: [birth year]   Cancelled Treatment:    Reason Eval/Treat Not Completed: Fatigue/lethargy limiting ability to participate. Checked on pt twice.  [name] is sleepy and doesn't want to get OOB yet. Will try either later today or tomorrow.  [name] 05/21/2018, 1:56 PM  Nichole OtterMaryellen [name], OTR/L Acute Rehabilitation Services [phone] [facility] pager [phone] office 05/21/2018

## 2018-05-22 MED ORDER — METHYLPREDNISOLONE SODIUM SUCC 40 MG IJ SOLR
40.0000 mg | Freq: Two times a day (BID) | INTRAMUSCULAR | Status: DC
  Administered 2018-05-22 – 2018-05-23 (×2): 40 mg via INTRAVENOUS
  Filled 2018-05-22 (×2): qty 1

## 2018-05-22 MED ORDER — CEFDINIR 300 MG PO CAPS: 300.0000 mg | ORAL_CAPSULE | Freq: Two times a day (BID) | ORAL | Status: DC

## 2018-05-22 MED ORDER — LIP MEDEX EX OINT: TOPICAL_OINTMENT | CUTANEOUS | Status: DC | PRN

## 2018-05-22 MED ORDER — CEFDINIR 300 MG PO CAPS
300.0000 mg | ORAL_CAPSULE | Freq: Two times a day (BID) | ORAL | Status: DC
  Administered 2018-05-22 – 2018-05-23 (×3): 300 mg via ORAL
  Filled 2018-05-22 (×3): qty 1

## 2018-05-22 NOTE — Progress Notes (Signed)
Physical Therapy Treatment Patient Details Name: [name] MRN: [id number] DOB: 1966/03/06 Today's Date: 05/22/2018    History of Present Illness 52 y.o. female with medical history significant of HTN, COPD oxygen dependent on 4 L, chronic pain, cervical and lumbar surgeries, and GERD and admitted for COPD exacerbation    PT Comments    Pt ambulated in hallway and reports less dyspnea today.  Pt also able to ambulate on baseline 4L O2 with SPO2 93%.  Follow Up Recommendations  Outpatient PT     Equipment Recommendations  None recommended by PT    Recommendations for Other Services       Precautions / Restrictions Precautions Precautions: Fall Precaution Comments: chronic oxygen, monitor sats Restrictions Weight Bearing Restrictions: No    Mobility  Bed Mobility Overal bed mobility: Needs Assistance Bed Mobility: Supine to Sit;Sit to Supine     Supine to sit: Supervision Sit to supine: Supervision      Transfers Overall transfer level: Needs assistance Equipment used: Rolling walker (2 wheeled) Transfers: Sit to/from Stand Sit to Stand: Min guard            Ambulation/Gait Ambulation/Gait assistance: Min guard Gait Distance (Feet): 80 Feet Assistive device: Rolling walker (2 wheeled) Gait Pattern/deviations: Step-through pattern;Decreased stride length Gait velocity: decr   General Gait Details: min/guard for safety, monitored SPO2, mostly 93% on 4L O2 [place] today, minimal dyspnea   Stairs             Wheelchair Mobility    Modified Rankin (Stroke Patients Only)       Balance             Standing balance-Leahy Scale: Poor                              Cognition Arousal/Alertness: Awake/alert Behavior During Therapy: WFL for tasks assessed/performed Overall Cognitive Status: Within Functional Limits for tasks assessed                                        Exercises      General Comments         Pertinent Vitals/Pain Pain Assessment: No/denies pain    Home Living                      Prior Function            PT Goals (current goals can now be found in the care plan section) Progress towards PT goals: Progressing toward goals    Frequency    Min 3X/week      PT Plan Current plan remains appropriate    Co-evaluation              AM-PAC PT "6 Clicks" Daily Activity  Outcome Measure  Difficulty turning over in bed (including adjusting bedclothes, sheets and blankets)?: None Difficulty moving from lying on back to sitting on the side of the bed? : None Difficulty sitting down on and standing up from a chair with arms (e.g., wheelchair, bedside commode, etc,.)?: A Little Help needed moving to and from a bed to chair (including a wheelchair)?: A Little Help needed walking in hospital room?: A Little Help needed climbing 3-5 steps with a railing? : A Little 6 Click Score: 20    End of Session Equipment Utilized During Treatment: Oxygen;Gait  belt Activity Tolerance: Patient tolerated treatment well Patient left: with call bell/phone within reach;in bed;with bed alarm set   PT Visit Diagnosis: Difficulty in walking, not elsewhere classified (R26.2)     Time: 1610-96041353-1405 PT Time Calculation (min) (ACUTE ONLY): 12 min  Charges:  $Gait Training: 8-22 mins                     Zenovia JarredKati Joani Cosma, PT, DPT Acute Rehabilitation Services Office: [phone] Pager: [phone]  Sarajane JewsLEMYRE,KATHrine E 05/22/2018, 3:40 PM

## 2018-05-22 NOTE — Progress Notes (Addendum)
PROGRESS NOTE [facility]   [name]   ZOX:[id number] DOB: Jan 17, 1966  DOA: 05/19/2018 PCP: [name], MD   Brief Narrative:  [name] 52 year old female with medical history significant for hypertension, COPD oxygen dependent on 4 L, chronic back pain and GERD presented to the emergency department complaining of worsening shortness of breath and cough.  Patient dealing with symptoms over several weeks but acutely worse over the past 7 days prior to admission.  Patient has been seen pulmonology at [facility] where she was scheduled for bronchoscopy to be performed on 9/12.  Upon ED evaluation patient was found to be afebrile with severe cough, dyspneic, Solu-Medrol and duo nebs were given without symptom relief.  Chest x-ray showed no edema or consolidation, labs revealed WBC of 17.5, otherwise unremarkable.  Patient was admitted with working diagnosis of COPD exacerbation.  Subjective: Patient feeling a little better each day, husband notes her color is much better than on admission.  Still wheezing and coughing.    Assessment & Plan: COPD exacerbation/acute bronchitis/ + rhinovirus / home O2 4L/ active smoker: slowly improving daily. Will cont nebs/ O2 support. Pt on home O2 4L [place] uses mostly when moving around.  - will decrease IV Solumedrol to 40 bid - if cont's to improve w/ consider dc tomorrow on po prednisone - viral panel +for rhinovirus, CXR neg for infiltrate, will dc IV abx and start cefdinir to complete 7d course - Continue Robitussin.     Leukocytosis Felt to be secondary to viral infection and chronic steroid use.  WBC trending down  Chronic back pain Continue home regimen with morphine and oxycodone as needed  Hypertension BP stable Continue losartan  Tobacco abuse Cessation discussed, nicotine patch.  DVT prophylaxis: Lovenox Code Status: Full code Family Communication: Husband at bedside Disposition Plan: Home in 1 days if  respiratory status improved.   [name] MD [facility] pgr [phone] 02/01/2018, 9:25 AM    Consultants:   None  Procedures:   None  Antimicrobials: Anti-infectives (From admission, onward)   Start     Dose/Rate Route Frequency Ordered Stop   05/20/18 0600  ceFEPIme (MAXIPIME) 1 g in sodium chloride 0.9 % 100 mL IVPB     1 g 200 mL/hr over 30 Minutes Intravenous Every 8 hours 05/19/18 2305 05/25/18 0559   05/19/18 2330  ceFEPIme (MAXIPIME) 2 g in sodium chloride 0.9 % 100 mL IVPB     2 g 200 mL/hr over 30 Minutes Intravenous  Once 05/19/18 2319 05/20/18 0209         Objective: Vitals:   05/22/18 0143 05/22/18 0519 05/22/18 0839 05/22/18 0955  BP:  (!) 142/89  (!) 154/100  Pulse:  74 73 84  Resp:  18 16   Temp:      TempSrc:      SpO2: 94% 95% 97%   Weight:      Height:        Intake/Output Summary (Last 24 hours) at 05/22/2018 1233 Last data filed at 05/22/2018 0346 Gross per 24 hour  Intake 2183.33 ml  Output -  Net 2183.33 ml   Filed Weights   05/20/18 0013  Weight: 89.8 kg    Examination:  General: Pt is alert, awake, not in acute distress Cardiovascular: RRR, S1/S2 +, no rubs, no gallops Respiratory: Some improving air entry but still tight and wheezy.  Rhonchi throughout. Abdominal: Soft, NT, ND, bowel sounds + Extremities: no edema, no cyanosis  Skin: No rashes Neurology: Nonfocal  Data Reviewed: I have personally reviewed following labs and imaging studies  CBC: Recent Labs  Lab 05/19/18 1809 05/20/18 0432  WBC 17.5* 12.3*  NEUTROABS 16.6*  --   HGB 14.4 14.1  HCT 45.8 45.9  MCV 91.6 92.0  PLT 294 299   Basic Metabolic Panel: Recent Labs  Lab 05/19/18 1809 05/20/18 0432 05/21/18 0400  NA 139 138 139  K 4.8 4.8 4.3  CL 94* 95* 95*  CO2 34* 35* 35*  GLUCOSE 114* 165* 116*  BUN 7 8 12   CREATININE 0.54 0.48 0.57  CALCIUM 8.8* 8.9 8.7*  MG  --   --  2.0   GFR: Estimated Creatinine Clearance: 87.5  mL/min (by C-G formula based on SCr of 0.57 mg/dL). Liver Function Tests: Recent Labs  Lab 05/19/18 1809  AST 21  ALT 18  ALKPHOS 68  BILITOT 0.5  PROT 6.7  ALBUMIN 3.9   No results for input(s): LIPASE, AMYLASE in the last 168 hours. No results for input(s): AMMONIA in the last 168 hours. Coagulation Profile: No results for input(s): INR, PROTIME in the last 168 hours. Cardiac Enzymes: No results for input(s): CKTOTAL, CKMB, CKMBINDEX, TROPONINI in the last 168 hours. BNP (last 3 results) No results for input(s): PROBNP in the last 8760 hours. HbA1C: No results for input(s): HGBA1C in the last 72 hours. CBG: No results for input(s): GLUCAP in the last 168 hours. Lipid Profile: No results for input(s): CHOL, HDL, LDLCALC, TRIG, CHOLHDL, LDLDIRECT in the last 72 hours. Thyroid Function Tests: No results for input(s): TSH, T4TOTAL, FREET4, T3FREE, THYROIDAB in the last 72 hours. Anemia Panel: No results for input(s): VITAMINB12, FOLATE, FERRITIN, TIBC, IRON, RETICCTPCT in the last 72 hours. Sepsis Labs: No results for input(s): PROCALCITON, LATICACIDVEN in the last 168 hours.  Recent Results (from the past 240 hour(s))  Respiratory Panel by PCR     Status: Abnormal   Collection Time: 05/20/18  1:32 AM  Result Value Ref Range Status   Adenovirus NOT DETECTED NOT DETECTED Final   Coronavirus 229E NOT DETECTED NOT DETECTED Final   Coronavirus HKU1 NOT DETECTED NOT DETECTED Final   Coronavirus NL63 NOT DETECTED NOT DETECTED Final   Coronavirus OC43 NOT DETECTED NOT DETECTED Final   Metapneumovirus NOT DETECTED NOT DETECTED Final   Rhinovirus / Enterovirus DETECTED (A) NOT DETECTED Final   Influenza A NOT DETECTED NOT DETECTED Final   Influenza B NOT DETECTED NOT DETECTED Final   Parainfluenza Virus 1 NOT DETECTED NOT DETECTED Final   Parainfluenza Virus 2 NOT DETECTED NOT DETECTED Final   Parainfluenza Virus 3 NOT DETECTED NOT DETECTED Final   Parainfluenza Virus 4 NOT  DETECTED NOT DETECTED Final   Respiratory Syncytial Virus NOT DETECTED NOT DETECTED Final   Bordetella pertussis NOT DETECTED NOT DETECTED Final   Chlamydophila pneumoniae NOT DETECTED NOT DETECTED Final   Mycoplasma pneumoniae NOT DETECTED NOT DETECTED Final    Comment: Performed at [facility], [address] [place], [place] [address]      Radiology Studies: No results found.    Scheduled Meds: . albuterol  2.5 mg Nebulization Q6H  . arformoterol  15 mcg Nebulization BID  . budesonide (PULMICORT) nebulizer solution  0.5 mg Nebulization BID  . enoxaparin (LOVENOX) injection  40 mg Subcutaneous QHS  . FLUoxetine  40 mg Oral Daily  . fluticasone  1 spray Each Nare Daily  . gabapentin  400 mg Oral TID  . loratadine  10 mg Oral Daily  . losartan  100 mg Oral Daily  . methylPREDNISolone (SOLU-MEDROL) injection  40 mg Intravenous Q12H  . montelukast  10 mg Oral QHS  . morphine  30 mg Oral BID  . nicotine  21 mg Transdermal Daily  . pantoprazole  40 mg Oral Daily  . polyethylene glycol  17 g Oral Daily  . sodium chloride flush  3 mL Intravenous Q12H  . sodium chloride flush  3 mL Intravenous Q12H  . tiotropium  18 mcg Inhalation Daily   Continuous Infusions: . sodium chloride Stopped (05/20/18 0628)  . ceFEPime (MAXIPIME) IV 1 g (05/22/18 0537)     LOS: 2 days     If 7PM-7AM, please contact night-coverage [email or web address] 05/22/2018, 12:33 PM

## 2018-05-22 NOTE — Progress Notes (Signed)
Occupational Therapy Treatment Patient Details Name: Nichole RanchDrinda S Morrison MRN: [id number] DOB: 07/10/1966 Today's Date: 05/22/2018    History of present illness 52 y.o. female with medical history significant of HTN, COPD oxygen dependent on 4 L, chronic pain, cervical and lumbar surgeries, and GERD and admitted for COPD exacerbation   OT comments  Performed ADL with cues for energy conservation/rest breaks  Follow Up Recommendations  Supervision/Assistance - 24 hour    Equipment Recommendations  None recommended by OT    Recommendations for Other Services      Precautions / Restrictions Precautions Precautions: Fall Precaution Comments: chronic oxygen, monitor sats Restrictions Weight Bearing Restrictions: No       Mobility Bed Mobility         Supine to sit: Supervision;HOB elevated        Transfers   Equipment used: Rolling walker (2 wheeled)   Sit to Stand: Min guard              Balance             Standing balance-Leahy Scale: Poor                             ADL either performed or assessed with clinical judgement   ADL Overall ADL's : Needs assistance/impaired     Grooming: Set up;Sitting   Upper Body Bathing: Supervision/ safety;Sitting   Lower Body Bathing: Moderate assistance;Sit to/from stand   Upper Body Dressing : Minimal assistance;Sitting       Toilet Transfer: Min guard;Ambulation;BSC;RW   Toileting- ArchitectClothing Manipulation and Hygiene: Min guard;Sit to/from stand         General ADL Comments: Ambulated to bathroom  and completed toileting and ADL. Cues for rest breaks:  pt tends to push through [employer]     Vision       Perception     Praxis      Cognition Arousal/Alertness: Awake/alert Behavior During Therapy: [facility] for tasks assessed/performed Overall Cognitive Status: Within Functional Limits for tasks assessed                                          Exercises     Shoulder  Instructions       General Comments      Pertinent Vitals/ Pain       Pain Assessment: No/denies pain  Home Living                                          Prior Functioning/Environment              Frequency  Min 2X/week        Progress Toward Goals  OT Goals(current goals can now be found in the care plan section)  Progress towards OT goals: Progressing toward goals     Plan      Co-evaluation                 AM-PAC PT "6 Clicks" Daily Activity     Outcome Measure   Help from another person eating meals?: None Help from another person taking care of personal grooming?: A Little Help from another person toileting, which includes using toliet, bedpan, or urinal?: A Little Help from another  person bathing (including washing, rinsing, drying)?: A Little Help from another person to put on and taking off regular upper body clothing?: A Little Help from another person to put on and taking off regular lower body clothing?: A Lot 6 Click Score: 18    End of Session    OT Visit Diagnosis: Muscle weakness (generalized) (M62.81);Unsteadiness on feet (R26.81)   Activity Tolerance Patient tolerated treatment well   Patient Left in bed;with call bell/phone within reach;with bed alarm set   Nurse Communication          Time: 1610-9604 OT Time Calculation (min): 50 min  Charges: OT General Charges $OT Visit: 1 Visit OT Treatments $Self Care/Home Management : 38-52 mins  Marica Otter, OTR/L Acute Rehabilitation Services [phone] [facility] pager [phone] office 05/22/2018   [name] 05/22/2018, 3:17 PM

## 2018-05-23 DIAGNOSIS — G894 Chronic pain syndrome: Secondary | ICD-10-CM

## 2018-05-23 MED ORDER — ALBUTEROL SULFATE (2.5 MG/3ML) 0.083% IN NEBU: 2.5000 mg | INHALATION_SOLUTION | RESPIRATORY_TRACT | 12 refills | Status: DC | PRN

## 2018-05-23 MED ORDER — CEFDINIR 300 MG PO CAPS: 300.0000 mg | ORAL_CAPSULE | Freq: Two times a day (BID) | ORAL | 0 refills | Status: AC

## 2018-05-23 MED ORDER — PREDNISONE 10 MG PO TABS: ORAL_TABLET | ORAL | 0 refills | Status: DC

## 2018-05-23 NOTE — Discharge Summary (Signed)
Physician Discharge Summary  Patient ID: [name] MRN: [id number] DOB/AGE: 1966/06/26 52 y.o.  Admit date: 05/19/2018 Discharge date: 05/23/2018  Admission Diagnoses: Principal Problem:   COPD exacerbation (HCC)   Acute bronchitis   Essential hypertension   Chronic respiratory failure with hypoxia (HCC)   Chronic pain   Leukocytosis   Tobacco abuse     Discharge Diagnoses:  Principal Problem:   COPD exacerbation (HCC)   Acute bronchitis   Essential hypertension   Chronic respiratory failure with hypoxia (HCC)   Chronic pain   Leukocytosis   Tobacco abuse   Home oxygen use   Discharged Condition: good  Presentation Summary: [name] 52 year old female with medical history significant for hypertension, COPD oxygen dependent on 4 L, chronic back pain and GERD presented to the emergency department complaining of worsening shortness of breath and cough.  Patient dealing with symptoms over several weeks but acutely worse over the past 7 days prior to admission.  [facility] for bronchoscopy to be performed on 9/12.  Upon ED evaluation patient was found to be afebrile with severe cough, dyspneic, Solu-Medrol and duo nebs were given without symptom relief.  Chest x-ray showed no edema or consolidation, labs revealed WBC of 17.5, otherwise unremarkable.  Patient was admitted with working diagnosis of COPD exacerbation.   Hospital Course/ Problems:  1) COPD exacerbation/acute bronchitis/ + rhinovirus / home O2 4L/ active smoker: slowly improved daily. Ready for dc. Pt on home O2 4L [place] uses mostly when moving around.  - pred taper 60 mg >> 0mg  over 1 week - 2 1/2 days of po abx left (cefdinir) - Rx given for albuterol nebs - resume home albuterol nebs/ symbicort/ montelukast/ spiriva and home O2  2) Leukocytosis Felt to be secondary to viral infection and chronic steroid use.  WBC trending down  3)  Chronic back pain Continue home regimen with morphine and oxycodone as needed  Hypertension BP stable Continue losartan  Tobacco abuse Cessation discussed, nicotine patch.    Discharge Exam: Blood pressure (!) 136/92, pulse 71, temperature 98.4 F (36.9 C), resp. rate 16, height 5\' 3"  (1.6 m), weight 89.8 kg, SpO2 98 %.  alert, no distress, nasal O2  no j vd  Chest no rales, diffuse mild exp wheezing, moving air well  cor no Reg RR  abd soft ntnd no ascites  ext no edema  NF ox 3  Disposition: Discharge disposition: 01-Home or Self Care        Allergies as of 05/23/2018      Reactions   Amoxicillin-pot Clavulanate Itching   Ok with benadryl   Erythromycin Rash   Ok with benadryl    Levofloxacin Itching   Ok with benadryl   Moxifloxacin Itching   Reaction to Avelox - ok with benadryl   Sulfamethoxazole-trimethoprim Itching   Ok with benadryl      Medication List    STOP taking these medications   azithromycin 250 MG tablet Commonly known as:  ZITHROMAX     TAKE these medications   Aclidinium Bromide 400 MCG/ACT Aepb Inhale 1 puff into the lungs 2 (two) times daily.   albuterol (2.5 MG/3ML) 0.083% nebulizer solution Commonly known as:  PROVENTIL Take 3 mLs (2.5 mg total) by nebulization every 4 (four) hours as needed for wheezing or shortness of breath.   budesonide-formoterol 160-4.5 MCG/ACT inhaler Commonly known as:  SYMBICORT Inhale 2 puffs 2 (two) times daily into  the lungs.   cefdinir 300 MG capsule Commonly known as:  OMNICEF Take 1 capsule (300 mg total) by mouth every 12 (twelve) hours for 5 doses.   cetirizine 10 MG tablet Commonly known as:  ZYRTEC Take 10 mg daily by mouth.   FLUoxetine 40 MG capsule Commonly known as:  PROZAC Take 40 mg by mouth daily.   fluticasone 50 MCG/ACT nasal spray Commonly known as:  FLONASE Place 1 spray daily into both nostrils.   gabapentin 400 MG capsule Commonly known as:  NEURONTIN Take 400  mg by mouth 3 (three) times daily.   losartan 100 MG tablet Commonly known as:  COZAAR Take 100 mg daily by mouth.   montelukast 10 MG tablet Commonly known as:  SINGULAIR Take 10 mg at bedtime by mouth.   MORPHABOND ER 30 MG T12a Generic drug:  Morphine Sulfate ER Take 30 mg 2 (two) times daily by mouth.   MUCINEX MAXIMUM STRENGTH 1200 MG Tb12 Generic drug:  Guaifenesin Take 1,200 mg daily as needed by mouth (to thin phlegm/cough).   nystatin-triamcinolone ointment Commonly known as:  MYCOLOG Apply 1 application 2 (two) times daily as needed topically (rash).   oxyCODONE-acetaminophen 10-325 MG tablet Commonly known as:  PERCOCET Take 1 tablet by mouth 3 (three) times daily.   OXYGEN Inhale 3-4 L continuous into the lungs.   predniSONE 10 MG tablet Commonly known as:  DELTASONE Take 6 pills (60mg )  at 10 am on 9/15 Take 5 pills (50mg ) at 10 am on 9/16 Take 4 pills (40mg ) at 10 am on 9/17 Take 3 pills (30mg ) at 10 am on 9/18 Take 2 pills (20mg ) at 10 am on 9/19 Take 1 pills (10mg ) at 10 am on 9/20 Then stop What changed:    how much to take  how to take this  when to take this  additional instructions   SPIRIVA RESPIMAT 2.5 MCG/ACT Aers Generic drug:  Tiotropium Bromide Monohydrate Inhale 1 puff daily into the lungs.        Signed: Barbette Hair Jeani Fassnacht 05/23/2018, 12:13 PM

## 2018-09-22 ENCOUNTER — Other Ambulatory Visit: Payer: Self-pay | Admitting: Pain Medicine

## 2018-09-22 DIAGNOSIS — M25552 Pain in left hip: Secondary | ICD-10-CM

## 2018-09-29 ENCOUNTER — Ambulatory Visit
Admission: RE | Admit: 2018-09-29 | Discharge: 2018-09-29 | Disposition: A | Discharge status: Home / Self Care | Payer: Medicare Other | Source: Ambulatory Visit | Attending: Pain Medicine | Admitting: Pain Medicine

## 2018-09-29 DIAGNOSIS — M25552 Pain in left hip: Secondary | ICD-10-CM

## 2019-02-08 ENCOUNTER — Ambulatory Visit: Payer: Self-pay | Admitting: Orthopedic Surgery

## 2019-02-12 NOTE — Patient Instructions (Addendum)
YOU NEED TO HAVE A COVID 19 TEST ON_______ [name] [birth year]_______, THIS TEST MUST BE DONE BEFORE SURGERY, COME TO [facility] ENTR[facility].                 [name]    Your procedure is scheduled on: 02-18-2019    Report to [facility] Main  Entrance     Report to SHORT STAY at 530 AM      Call this number if you have problems the morning of surgery [phone]      Remember:  BRUSH YOUR TEETH MORNING OF SURGERY AND RINSE YOUR MOUTH OUT, NO CHEWING GUM CANDY OR MINTS.    NO SOLID FOOD AFTER MIDNIGHT THE NIGHT PRIOR TO SURGERY. NOTHING BY MOUTH EXCEPT CLEAR LIQUIDS UNTIL 430 AM.  PLEASE FINISH ENSURE DRINK PER SURGEON ORDER AT 430 AM.    CLEAR LIQUID DIET   Foods Allowed                                                                     Foods Excluded  Coffee and tea, regular and decaf                             liquids that you cannot  Plain Jell-O in any flavor                                             see through such as: Fruit ices (not with fruit pulp)                                     milk, soups, orange juice  Iced Popsicles                                    All solid food Carbonated beverages, regular and diet                                    Cranberry, grape and apple juices Sports drinks like Gatorade Lightly seasoned clear broth or consume(fat free) Sugar, honey syrup  Sample Menu Breakfast                                Lunch                                     Supper Cranberry juice                    Beef broth                            Chicken broth Jell-O  Grape juice                           Apple juice Coffee or tea                        Jell-O                                      Popsicle                                                Coffee or tea                        Coffee or tea  _____________________________________________________________________     Take  these medicines the morning of surgery with A SIP OF WATER: ALPRAZOLAM (XANAX )IF NEEDED, ALBUTEROL NEBULIZER, SYMBICORT, SPIRIVA, GABAPENTIN (NEURONTIN), FLUOXETINE (PROZAC), CERTRIZINE (ZYRTEC, PREDNISONE, AZITHROMYCIN                                  You may not have any metal on your body including hair pins and              piercings  Do not wear jewelry, make-up, lotions, powders or perfumes, deodorant             Do not wear nail polish.  Do not shave  48 hours prior to surgery.                  Do not bring valuables to the hospital. [place] IS NOT             RESPONSIBLE   FOR VALUABLES.  Contacts, dentures or bridgework may not be worn into surgery.  Leave suitcase in the car. After surgery it may be brought to your room.      _____________________________________________________________________             [facility] Before surgery, you can play an important role.  Because skin is not sterile, your skin needs to be as free of germs as possible.  You can reduce the number of germs on your skin by washing with CHG (chlorahexidine gluconate) soap before surgery.  CHG is an antiseptic cleaner which kills germs and bonds with the skin to continue killing germs even after washing. Please DO NOT use if you have an allergy to CHG or antibacterial soaps.  If your skin becomes reddened/irritated stop using the CHG and inform your nurse when you arrive at Short Stay. Do not shave (including legs and underarms) for at least 48 hours prior to the first CHG shower.  You may shave your face/neck. Please follow these instructions carefully:  1.  Shower with CHG Soap the night before surgery and the  morning of Surgery.  2.  If you choose to wash your hair, wash your hair first as usual with your  normal  shampoo.  3.  After you shampoo, rinse your hair and body thoroughly to remove the  shampoo.  4.  Use CHG as you would any other liquid  soap.  You can apply chg directly  to the skin and wash                       Gently with a scrungie or clean washcloth.  5.  Apply the CHG Soap to your body ONLY FROM THE NECK DOWN.   Do not use on face/ open                           Wound or open sores. Avoid contact with eyes, ears mouth and genitals (private parts).                       Wash face,  Genitals (private parts) with your normal soap.             6.  Wash thoroughly, paying special attention to the area where your surgery  will be performed.  7.  Thoroughly rinse your body with warm water from the neck down.  8.  DO NOT shower/wash with your normal soap after using and rinsing off  the CHG Soap.                9.  Pat yourself dry with a clean towel.            10.  Wear clean pajamas.            11.  Place clean sheets on your bed the night of your first shower and do not  sleep with pets. Day of Surgery : Do not apply any lotions/deodorants the morning of surgery.  Please wear clean clothes to the hospital/surgery center.  FAILURE TO FOLLOW THESE INSTRUCTIONS MAY RESULT IN THE CANCELLATION OF YOUR SURGERY PATIENT SIGNATURE_________________________________  NURSE SIGNATURE__________________________________  ________________________________________________________________________   Adam Phenix  An incentive spirometer is a tool that can help keep your lungs clear and active. This tool measures how well you are filling your lungs with each breath. Taking long deep breaths may help reverse or decrease the chance of developing breathing (pulmonary) problems (especially infection) following:  A long period of time when you are unable to move or be active. BEFORE THE PROCEDURE   If the spirometer includes an indicator to show your best effort, your nurse or respiratory therapist will set it to a desired goal.  If possible, sit up straight or lean slightly forward. Try not to slouch.  Hold the incentive spirometer  in an upright position. INSTRUCTIONS FOR USE  1. Sit on the edge of your bed if possible, or sit up as far as you can in bed or on a chair. 2. Hold the incentive spirometer in an upright position. 3. Breathe out normally. 4. Place the mouthpiece in your mouth and seal your lips tightly around it. 5. Breathe in slowly and as deeply as possible, raising the piston or the ball toward the top of the column. 6. Hold your breath for 3-5 seconds or for as long as possible. Allow the piston or ball to fall to the bottom of the column. 7. Remove the mouthpiece from your mouth and breathe out normally. 8. Rest for a few seconds and repeat Steps 1 through 7 at least 10 times every 1-2 hours when you are awake. Take your time and take a few normal breaths between deep breaths. 9. The spirometer may include an indicator to  show your best effort. Use the indicator as a goal to work toward during each repetition. 10. After each set of 10 deep breaths, practice coughing to be sure your lungs are clear. If you have an incision (the cut made at the time of surgery), support your incision when coughing by placing a pillow or rolled up towels firmly against it. Once you are able to get out of bed, walk around indoors and cough well. You may stop using the incentive spirometer when instructed by your caregiver.  RISKS AND COMPLICATIONS  Take your time so you do not get dizzy or light-headed.  If you are in pain, you may need to take or ask for pain medication before doing incentive spirometry. It is harder to take a deep breath if you are having pain. AFTER USE  Rest and breathe slowly and easily.  It can be helpful to keep track of a log of your progress. Your caregiver can provide you with a simple table to help with this. If you are using the spirometer at home, follow these instructions: [place] IF:   You are having difficultly using the spirometer.  You have trouble using the spirometer as  often as instructed.  Your pain medication is not giving enough relief while using the spirometer.  You develop fever of 100.5 F (38.1 C) or higher. SEEK IMMEDIATE MEDICAL CARE IF:   You cough up bloody sputum that had not been present before.  You develop fever of 102 F (38.9 C) or greater.  You develop worsening pain at or near the incision site. MAKE SURE YOU:   Understand these instructions.  Will watch your condition.  Will get help right away if you are not doing well or get worse. Document Released: 01/06/2007 Document Revised: 11/18/2011 Document Reviewed: 03/09/2007 ExitCare Patient Information 2014 ExitCare, [place].   ________________________________________________________________________  WHAT IS A BLOOD TRANSFUSION? Blood Transfusion Information  A transfusion is the replacement of blood or some of its parts. Blood is made up of multiple cells which provide different functions.  Red blood cells carry oxygen and are used for blood loss replacement.  White blood cells fight against infection.  Platelets control bleeding.  Plasma helps clot blood.  Other blood products are available for specialized needs, such as hemophilia or other clotting disorders. BEFORE THE TRANSFUSION  Who gives blood for transfusions?   Healthy volunteers who are fully evaluated to make sure their blood is safe. This is blood bank blood. Transfusion therapy is the safest it has ever been in the practice of medicine. Before blood is taken from a donor, a complete history is taken to make sure that person has no history of diseases nor engages in risky social behavior (examples are intravenous drug use or sexual activity with multiple partners). The donor's travel history is screened to minimize risk of transmitting infections, such as malaria. The donated blood is tested for signs of infectious diseases, such as HIV and hepatitis. The blood is then tested to be sure it is compatible with  you in order to minimize the chance of a transfusion reaction. If you or a relative donates blood, this is often done in anticipation of surgery and is not appropriate for emergency situations. It takes many days to process the donated blood. RISKS AND COMPLICATIONS Although transfusion therapy is very safe and saves many lives, the main dangers of transfusion include:   Getting an infectious disease.  Developing a transfusion reaction. This is an allergic reaction  to something in the blood you were given. Every precaution is taken to prevent this. The decision to have a blood transfusion has been considered carefully by your caregiver before blood is given. Blood is not given unless the benefits outweigh the risks. AFTER THE TRANSFUSION  Right after receiving a blood transfusion, you will usually feel much better and more energetic. This is especially true if your red blood cells have gotten low (anemic). The transfusion raises the level of the red blood cells which carry oxygen, and this usually causes an energy increase.  The nurse administering the transfusion will monitor you carefully for complications. HOME CARE INSTRUCTIONS  No special instructions are needed after a transfusion. You may find your energy is better. Speak with your caregiver about any limitations on activity for underlying diseases you may have. SEEK MEDICAL CARE IF:   Your condition is not improving after your transfusion.  You develop redness or irritation at the intravenous (IV) site. SEEK IMMEDIATE MEDICAL CARE IF:  Any of the following symptoms occur over the next 12 hours:  Shaking chills.  You have a temperature by mouth above 102 F (38.9 C), not controlled by medicine.  Chest, back, or muscle pain.  People around you feel you are not acting correctly or are confused.  Shortness of breath or difficulty breathing.  Dizziness and fainting.  You get a rash or develop hives.  You have a decrease in  urine output.  Your urine turns a dark color or changes to pink, red, or brown. Any of the following symptoms occur over the next 10 days:  You have a temperature by mouth above 102 F (38.9 C), not controlled by medicine.  Shortness of breath.  Weakness after normal activity.  The white part of the eye turns yellow (jaundice).  You have a decrease in the amount of urine or are urinating less often.  Your urine turns a dark color or changes to pink, red, or brown. Document Released: 08/23/2000 Document Revised: 11/18/2011 Document Reviewed: 04/11/2008 [facility] Patient Information 2014 [place], [place].  _______________________________________________________________________

## 2019-02-12 NOTE — Progress Notes (Signed)
LOV PULMONARY 09-22-18 LINDSAY BOLES Epic ECHO 08-19-17 Epic

## 2019-02-15 ENCOUNTER — Encounter (HOSPITAL_COMMUNITY)
Admission: RE | Admit: 2019-02-15 | Discharge: 2019-02-15 | Disposition: A | Discharge status: Home / Self Care | Payer: Medicare Other | Source: Ambulatory Visit | Attending: Orthopedic Surgery | Admitting: Orthopedic Surgery

## 2019-02-15 ENCOUNTER — Other Ambulatory Visit: Payer: Self-pay

## 2019-02-15 ENCOUNTER — Ambulatory Visit: Payer: Self-pay | Admitting: Orthopedic Surgery

## 2019-02-15 ENCOUNTER — Encounter (HOSPITAL_COMMUNITY): Payer: Self-pay

## 2019-02-15 ENCOUNTER — Other Ambulatory Visit (HOSPITAL_COMMUNITY)
Admission: RE | Admit: 2019-02-15 | Discharge: 2019-02-15 | Disposition: A | Discharge status: Home / Self Care | Payer: Medicare Other | Source: Ambulatory Visit | Attending: Orthopedic Surgery | Admitting: Orthopedic Surgery

## 2019-02-15 DIAGNOSIS — Z79899 Other long term (current) drug therapy: Secondary | ICD-10-CM | POA: Insufficient documentation

## 2019-02-15 DIAGNOSIS — Z01818 Encounter for other preprocedural examination: Secondary | ICD-10-CM | POA: Insufficient documentation

## 2019-02-15 DIAGNOSIS — Z7952 Long term (current) use of systemic steroids: Secondary | ICD-10-CM | POA: Insufficient documentation

## 2019-02-15 DIAGNOSIS — I10 Essential (primary) hypertension: Secondary | ICD-10-CM | POA: Insufficient documentation

## 2019-02-15 DIAGNOSIS — M1612 Unilateral primary osteoarthritis, left hip: Secondary | ICD-10-CM | POA: Insufficient documentation

## 2019-02-15 DIAGNOSIS — G4730 Sleep apnea, unspecified: Secondary | ICD-10-CM | POA: Insufficient documentation

## 2019-02-15 DIAGNOSIS — F17210 Nicotine dependence, cigarettes, uncomplicated: Secondary | ICD-10-CM | POA: Insufficient documentation

## 2019-02-15 DIAGNOSIS — Z1159 Encounter for screening for other viral diseases: Secondary | ICD-10-CM | POA: Insufficient documentation

## 2019-02-15 DIAGNOSIS — Z7951 Long term (current) use of inhaled steroids: Secondary | ICD-10-CM | POA: Insufficient documentation

## 2019-02-15 DIAGNOSIS — K219 Gastro-esophageal reflux disease without esophagitis: Secondary | ICD-10-CM | POA: Insufficient documentation

## 2019-02-15 DIAGNOSIS — Z9981 Dependence on supplemental oxygen: Secondary | ICD-10-CM | POA: Insufficient documentation

## 2019-02-15 DIAGNOSIS — J449 Chronic obstructive pulmonary disease, unspecified: Secondary | ICD-10-CM | POA: Insufficient documentation

## 2019-02-15 DIAGNOSIS — I350 Nonrheumatic aortic (valve) stenosis: Secondary | ICD-10-CM | POA: Insufficient documentation

## 2019-02-15 HISTORY — DX: Dependence on supplemental oxygen: Z99.81

## 2019-02-15 HISTORY — DX: Sleep apnea, unspecified: G47.30

## 2019-02-15 LAB — CBC
HCT: 48.9 % — ABNORMAL HIGH (ref 36.0–46.0)
Hemoglobin: 15 g/dL (ref 12.0–15.0)
MCH: 28 pg (ref 26.0–34.0)
MCHC: 30.7 g/dL (ref 30.0–36.0)
MCV: 91.2 fL (ref 80.0–100.0)
Platelets: 355 10*3/uL (ref 150–400)
RBC: 5.36 MIL/uL — ABNORMAL HIGH (ref 3.87–5.11)
RDW: 15.1 % (ref 11.5–15.5)
WBC: 15.6 10*3/uL — ABNORMAL HIGH (ref 4.0–10.5)
nRBC: 0 % (ref 0.0–0.2)

## 2019-02-15 LAB — URINALYSIS, ROUTINE W REFLEX MICROSCOPIC
Bilirubin Urine: NEGATIVE
Glucose, UA: NEGATIVE mg/dL
Hgb urine dipstick: NEGATIVE
Ketones, ur: NEGATIVE mg/dL
Leukocytes,Ua: NEGATIVE
Nitrite: NEGATIVE
Protein, ur: NEGATIVE mg/dL
Specific Gravity, Urine: 1.002 — ABNORMAL LOW (ref 1.005–1.030)
pH: 7 (ref 5.0–8.0)

## 2019-02-15 LAB — COMPREHENSIVE METABOLIC PANEL
ALT: 16 U/L (ref 0–44)
AST: 20 U/L (ref 15–41)
Albumin: 4.1 g/dL (ref 3.5–5.0)
Alkaline Phosphatase: 72 U/L (ref 38–126)
Anion gap: 11 (ref 5–15)
BUN: 8 mg/dL (ref 6–20)
CO2: 28 mmol/L (ref 22–32)
Calcium: 9 mg/dL (ref 8.9–10.3)
Chloride: 100 mmol/L (ref 98–111)
Creatinine, Ser: 0.61 mg/dL (ref 0.44–1.00)
GFR calc Af Amer: >60 mL/min (ref >60)
GFR calc non Af Amer: >60 mL/min (ref >60)
Glucose, Bld: 111 mg/dL — ABNORMAL HIGH (ref 70–99)
Potassium: 4.1 mmol/L (ref 3.5–5.1)
Sodium: 139 mmol/L (ref 135–145)
Total Bilirubin: 0.3 mg/dL (ref 0.3–1.2)
Total Protein: 7.2 g/dL (ref 6.5–8.1)

## 2019-02-15 LAB — PROTIME-INR
INR: 0.9 (ref 0.8–1.2)
Prothrombin Time: 12.1 seconds (ref 11.4–15.2)

## 2019-02-15 LAB — SURGICAL PCR SCREEN
MRSA, PCR: NEGATIVE
Staphylococcus aureus: NEGATIVE

## 2019-02-15 LAB — APTT: aPTT: 27 seconds (ref 24–36)

## 2019-02-15 LAB — ABO/RH: ABO/RH(D): B NEG

## 2019-02-15 NOTE — H&P (View-Only) (Signed)
TOTAL HIP ADMISSION H&P  Patient is admitted for left total hip arthroplasty.  Subjective:  Chief Complaint: left hip pain  HPI: [name], 53 y.o. female, has a history of pain and functional disability in the left hip(s) due to trauma and patient has failed non-surgical conservative treatments for greater than 12 weeks to include NSAID's and/or analgesics, flexibility and strengthening excercises, use of assistive devices, weight reduction as appropriate and activity modification.  Onset of symptoms was gradual starting 3 years ago with gradually worsening course since that time.The patient noted no past surgery on the left hip(s).  Patient currently rates pain in the left hip at 10 out of 10 with activity. Patient has night pain, worsening of pain with activity and weight bearing, trendelenberg gait, pain that interfers with activities of daily living and pain with passive range of motion. Patient has evidence of subchondral cysts, subchondral sclerosis, periarticular osteophytes and joint space narrowing by imaging studies. This condition presents safety issues increasing the risk of falls. There is no current active infection.  Patient Active Problem List   Diagnosis Date Noted  . Chronic respiratory failure with hypoxia (HCC) 05/20/2018  . Chronic pain 05/20/2018  . Leukocytosis 05/20/2018  . Tobacco abuse 05/20/2018  . COPD with acute exacerbation (HCC) 05/20/2018  . Shortness of breath   . Essential hypertension   . GERD (gastroesophageal reflux disease)   . COPD (chronic obstructive pulmonary disease) (HCC)   . Asthma   . Closed fracture of left proximal humerus   . Obesity due to excess calories   . Surgery, elective   . Back pain   . Fall   . Near syncope   . Acute pain of left shoulder   . Closed displaced oblique fracture of shaft of left humerus 07/20/2017  . Lumbar spinal stenosis 04/03/2012    Class: Diagnosis of  . COPD exacerbation (HCC) 05/31/2010  . ALLERGIC  RHINITIS 05/09/2010  . DYSPNEA ON EXERTION 05/09/2010   Past Medical History:  Diagnosis Date  . Allergic rhinitis   . Asthma   . COPD (chronic obstructive pulmonary disease) (HCC)    on home O2  . GERD (gastroesophageal reflux disease)   . Hypertension   . Shortness of breath   . Sleep apnea    NO DEVICE IN USE, USES SUPPLEMENTAL O2 IN PLACE OF DEVICE   . Supplemental oxygen dependent    4L CONTINUOUS     Past Surgical History:  Procedure Laterality Date  . HUMERUS IM NAIL Left 07/23/2017   Procedure: INTRAMEDULLARY (IM) NAIL HUMERAL;  Surgeon: Yolonda Kidaogers, Jason Patrick, MD;  [facility];  Service: Orthopedics;  Laterality: Left;  . LUMBAR LAMINECTOMY/DECOMPRESSION MICRODISCECTOMY  04/06/2012   Procedure: LUMBAR LAMINECTOMY/DECOMPRESSION MICRODISCECTOMY;  Surgeon: Kerrin ChampagneJames E Nitka, MD;  [facility];  Service: Orthopedics;  Laterality: N/A;  Left L3-4 and L4-5 lateral recess decompression MIS  . NECK SURGERY  Sept 2005  . TOTAL ABDOMINAL HYSTERECTOMY  Oct 2003  . TUBAL LIGATION  1995    Current Outpatient Medications  Medication Sig Dispense Refill Last Dose  . albuterol (PROVENTIL) (2.5 MG/3ML) 0.083% nebulizer solution Take 3 mLs (2.5 mg total) by nebulization every 4 (four) hours as needed for wheezing or shortness of breath. 75 mL 12   . ALPRAZolam (XANAX) 0.5 MG tablet Take 0.25-0.5 mg by mouth daily as needed for anxiety (shortness of breath).     Marland Kitchen. azithromycin (ZITHROMAX) 250 MG tablet Take 250 mg by mouth daily.     .Marland Kitchen  budesonide-formoterol (SYMBICORT) 160-4.5 MCG/ACT inhaler Inhale 2 puffs 2 (two) times daily into the lungs.   05/19/2018 at Unknown time  . cetirizine (ZYRTEC) 10 MG tablet Take 10 mg daily by mouth.   05/19/2018 at Unknown time  . esomeprazole (NEXIUM) 40 MG capsule Take 40 mg by mouth daily at 12 noon.     Marland Kitchen. FLUoxetine (PROZAC) 40 MG capsule Take 40 mg by mouth daily.   05/19/2018 at Unknown time  . fluticasone (FLONASE) 50 MCG/ACT nasal spray Place 1 spray  into both nostrils daily as needed for allergies.    05/19/2018 at Unknown time  . gabapentin (NEURONTIN) 400 MG capsule Take 400 mg by mouth 3 (three) times daily.  0 05/19/2018 at Unknown time  . guaiFENesin-dextromethorphan (ROBITUSSIN DM) 100-10 MG/5ML syrup Take 20 mLs by mouth every 4 (four) hours as needed for cough.     Marland Kitchen. HYDROcodone-acetaminophen (NORCO) 10-325 MG tablet Take 1 tablet by mouth every 6 (six) hours as needed for moderate pain.     Marland Kitchen. loperamide (IMODIUM A-D) 2 MG tablet Take 4 mg by mouth as needed for diarrhea or loose stools.     Marland Kitchen. losartan (COZAAR) 100 MG tablet Take 100 mg daily by mouth.    05/19/2018 at Unknown time  . montelukast (SINGULAIR) 10 MG tablet Take 10 mg at bedtime by mouth.   05/18/2018 at Unknown time  . morphine (MS CONTIN) 15 MG 12 hr tablet Take 15 mg by mouth 3 (three) times daily.     . naproxen sodium (ALEVE) 220 MG tablet Take 440 mg by mouth daily as needed (pain).     . nystatin (MYCOSTATIN) 100000 UNIT/ML suspension Take 5 mLs by mouth daily as needed (thrush).     . nystatin (NYSTATIN) powder Apply 1 g topically daily as needed (irritation).     . OXYGEN Inhale 3-4 L continuous into the lungs.   07/20/2017 at Unknown time  . predniSONE (DELTASONE) 10 MG tablet Take 6 pills (60mg )  at 10 am on 9/15 Take 5 pills (50mg ) at 10 am on 9/16 Take 4 pills (40mg ) at 10 am on 9/17 Take 3 pills (30mg ) at 10 am on 9/18 Take 2 pills (20mg ) at 10 am on 9/19 Take 1 pills (10mg ) at 10 am on 9/20 Then stop (Patient taking differently: Take 10 mg by mouth daily. ) 30 tablet 0   . Tetrahydrozoline HCl (REDNESS RELIEVER EYE DROPS OP) Place 1 drop into both eyes daily as needed (redness).     . Tiotropium Bromide Monohydrate (SPIRIVA RESPIMAT) 2.5 MCG/ACT AERS Inhale 2 puffs into the lungs daily.    05/19/2018 at Unknown time   No current facility-administered medications for this visit.    Allergies  Allergen Reactions  . Amoxicillin-Pot Clavulanate Itching    Ok  with benadryl Did it involve swelling of the face/tongue/throat, SOB, or low BP? No Did it involve sudden or severe rash/hives, skin peeling, or any reaction on the inside of your mouth or nose? No Did you need to seek medical attention at a hospital or doctor's office? No When did it last happen?6 months If all above answers are "NO", may proceed with cephalosporin use.   . Erythromycin Rash    Ok with benadryl   . Moxifloxacin Itching    Reaction to Avelox - ok with benadryl  . Sulfamethoxazole-Trimethoprim Itching    Ok with benadryl    Social History   Tobacco Use  . Smoking status: Current Every Day Smoker  Packs/day: 1.00    Years: 30.00    Pack years: 30.00    Types: Cigarettes  . Smokeless tobacco: Never Used  . Tobacco comment: currently smoking 1/2 ppd.   Substance Use Topics  . Alcohol use: Not Currently    Family History  Problem Relation Age of Onset  . Emphysema Father   . Rheum arthritis Father   . Lung cancer Father   . Emphysema Mother      Review of Systems  Constitutional: Negative.   HENT: Negative.   Eyes: Negative.   Respiratory: Positive for shortness of breath.   Cardiovascular: Negative.   Gastrointestinal: Negative.   Genitourinary: Negative.   Musculoskeletal: Positive for back pain and joint pain.  Skin: Negative.   Neurological: Negative.   Endo/Heme/Allergies: Negative.   Psychiatric/Behavioral: Negative.     Objective:  Physical Exam  Vitals reviewed. Constitutional: She is oriented to person, place, and time. She appears well-developed and well-nourished.  HENT:  Head: Normocephalic and atraumatic.  Eyes: Pupils are equal, round, and reactive to light. Conjunctivae and EOM are normal.  Neck: Normal range of motion. Neck supple.  Cardiovascular: Normal rate, regular rhythm and intact distal pulses.  Respiratory: Effort normal. No respiratory distress.  GI: Soft. She exhibits no distension.  Genitourinary:     Genitourinary Comments: deferred   Musculoskeletal:     Left hip: She exhibits decreased range of motion, decreased strength, bony tenderness and crepitus.  Neurological: She is alert and oriented to person, place, and time. She has normal reflexes.  Skin: Skin is warm and dry.  Psychiatric: She has a normal mood and affect. Her behavior is normal. Judgment and thought content normal.    Vital signs in last 24 hours: @VSRANGES @  Labs:   Estimated body mass index is 35.32 kg/m as calculated from the following:   Height as of an earlier encounter on 02/15/19: 5\' 3"  (1.6 m).   Weight as of an earlier encounter on 02/15/19: 90.4 kg.   Imaging Review Plain radiographs demonstrate severe degenerative joint disease of the left hip(s). The bone quality appears to be adequate for age and reported activity level.      Assessment/Plan:  End stage arthritis, left hip(s)  The patient history, physical examination, clinical judgement of the provider and imaging studies are consistent with end stage degenerative joint disease of the left hip(s) and total hip arthroplasty is deemed medically necessary. The treatment options including medical management, injection therapy, arthroscopy and arthroplasty were discussed at length. The risks and benefits of total hip arthroplasty were presented and reviewed. The risks due to aseptic loosening, infection, stiffness, dislocation/subluxation,  thromboembolic complications and other imponderables were discussed.  The patient acknowledged the explanation, agreed to proceed with the plan and consent was signed. Patient is being admitted for inpatient treatment for surgery, pain control, PT, OT, prophylactic antibiotics, VTE prophylaxis, progressive ambulation and ADL's and discharge planning.The patient is planning to be discharged home with HEP   Anticipated LOS equal to or greater than 2 midnights due to - Age 70 and older with one or more of the  following:  - Obesity  - Expected need for hospital services (PT, OT, Nursing) required for safe  discharge  - Anticipated need for postoperative skilled nursing care or inpatient rehab  - Active co-morbidities: Respiratory Failure/COPD OR   - Unanticipated findings during/Post Surgery: None  - Patient is a high risk of re-admission due to: None

## 2019-02-15 NOTE — H&P (Signed)
TOTAL HIP ADMISSION H&P  Patient is admitted for left total hip arthroplasty.  Subjective:  Chief Complaint: left hip pain  HPI: [name], 53 y.o. female, has a history of pain and functional disability in the left hip(s) due to trauma and patient has failed non-surgical conservative treatments for greater than 12 weeks to include NSAID's and/or analgesics, flexibility and strengthening excercises, use of assistive devices, weight reduction as appropriate and activity modification.  Onset of symptoms was gradual starting 3 years ago with gradually worsening course since that time.The patient noted no past surgery on the left hip(s).  Patient currently rates pain in the left hip at 10 out of 10 with activity. Patient has night pain, worsening of pain with activity and weight bearing, trendelenberg gait, pain that interfers with activities of daily living and pain with passive range of motion. Patient has evidence of subchondral cysts, subchondral sclerosis, periarticular osteophytes and joint space narrowing by imaging studies. This condition presents safety issues increasing the risk of falls. There is no current active infection.  Patient Active Problem List   Diagnosis Date Noted  . Chronic respiratory failure with hypoxia (HCC) 05/20/2018  . Chronic pain 05/20/2018  . Leukocytosis 05/20/2018  . Tobacco abuse 05/20/2018  . COPD with acute exacerbation (HCC) 05/20/2018  . Shortness of breath   . Essential hypertension   . GERD (gastroesophageal reflux disease)   . COPD (chronic obstructive pulmonary disease) (HCC)   . Asthma   . Closed fracture of left proximal humerus   . Obesity due to excess calories   . Surgery, elective   . Back pain   . Fall   . Near syncope   . Acute pain of left shoulder   . Closed displaced oblique fracture of shaft of left humerus 07/20/2017  . Lumbar spinal stenosis 04/03/2012    Class: Diagnosis of  . COPD exacerbation (HCC) 05/31/2010  . ALLERGIC  RHINITIS 05/09/2010  . DYSPNEA ON EXERTION 05/09/2010   Past Medical History:  Diagnosis Date  . Allergic rhinitis   . Asthma   . COPD (chronic obstructive pulmonary disease) (HCC)    on home O2  . GERD (gastroesophageal reflux disease)   . Hypertension   . Shortness of breath   . Sleep apnea    NO DEVICE IN USE, USES SUPPLEMENTAL O2 IN PLACE OF DEVICE   . Supplemental oxygen dependent    4L CONTINUOUS     Past Surgical History:  Procedure Laterality Date  . HUMERUS IM NAIL Left 07/23/2017   Procedure: INTRAMEDULLARY (IM) NAIL HUMERAL;  Surgeon: Yolonda Kidaogers, Jason Patrick, MD;  [facility];  Service: Orthopedics;  Laterality: Left;  . LUMBAR LAMINECTOMY/DECOMPRESSION MICRODISCECTOMY  04/06/2012   Procedure: LUMBAR LAMINECTOMY/DECOMPRESSION MICRODISCECTOMY;  Surgeon: Kerrin ChampagneJames E Nitka, MD;  [facility];  Service: Orthopedics;  Laterality: N/A;  Left L3-4 and L4-5 lateral recess decompression MIS  . NECK SURGERY  Sept 2005  . TOTAL ABDOMINAL HYSTERECTOMY  Oct 2003  . TUBAL LIGATION  1995    Current Outpatient Medications  Medication Sig Dispense Refill Last Dose  . albuterol (PROVENTIL) (2.5 MG/3ML) 0.083% nebulizer solution Take 3 mLs (2.5 mg total) by nebulization every 4 (four) hours as needed for wheezing or shortness of breath. 75 mL 12   . ALPRAZolam (XANAX) 0.5 MG tablet Take 0.25-0.5 mg by mouth daily as needed for anxiety (shortness of breath).     Marland Kitchen. azithromycin (ZITHROMAX) 250 MG tablet Take 250 mg by mouth daily.     .Marland Kitchen  budesonide-formoterol (SYMBICORT) 160-4.5 MCG/ACT inhaler Inhale 2 puffs 2 (two) times daily into the lungs.   05/19/2018 at Unknown time  . cetirizine (ZYRTEC) 10 MG tablet Take 10 mg daily by mouth.   05/19/2018 at Unknown time  . esomeprazole (NEXIUM) 40 MG capsule Take 40 mg by mouth daily at 12 noon.     . FLUoxetine (PROZAC) 40 MG capsule Take 40 mg by mouth daily.   05/19/2018 at Unknown time  . fluticasone (FLONASE) 50 MCG/ACT nasal spray Place 1 spray  into both nostrils daily as needed for allergies.    05/19/2018 at Unknown time  . gabapentin (NEURONTIN) 400 MG capsule Take 400 mg by mouth 3 (three) times daily.  0 05/19/2018 at Unknown time  . guaiFENesin-dextromethorphan (ROBITUSSIN DM) 100-10 MG/5ML syrup Take 20 mLs by mouth every 4 (four) hours as needed for cough.     . HYDROcodone-acetaminophen (NORCO) 10-325 MG tablet Take 1 tablet by mouth every 6 (six) hours as needed for moderate pain.     . loperamide (IMODIUM A-D) 2 MG tablet Take 4 mg by mouth as needed for diarrhea or loose stools.     . losartan (COZAAR) 100 MG tablet Take 100 mg daily by mouth.    05/19/2018 at Unknown time  . montelukast (SINGULAIR) 10 MG tablet Take 10 mg at bedtime by mouth.   05/18/2018 at Unknown time  . morphine (MS CONTIN) 15 MG 12 hr tablet Take 15 mg by mouth 3 (three) times daily.     . naproxen sodium (ALEVE) 220 MG tablet Take 440 mg by mouth daily as needed (pain).     . nystatin (MYCOSTATIN) 100000 UNIT/ML suspension Take 5 mLs by mouth daily as needed (thrush).     . nystatin (NYSTATIN) powder Apply 1 g topically daily as needed (irritation).     . OXYGEN Inhale 3-4 L continuous into the lungs.   07/20/2017 at Unknown time  . predniSONE (DELTASONE) 10 MG tablet Take 6 pills (60mg)  at 10 am on 9/15 Take 5 pills (50mg) at 10 am on 9/16 Take 4 pills (40mg) at 10 am on 9/17 Take 3 pills (30mg) at 10 am on 9/18 Take 2 pills (20mg) at 10 am on 9/19 Take 1 pills (10mg) at 10 am on 9/20 Then stop (Patient taking differently: Take 10 mg by mouth daily. ) 30 tablet 0   . Tetrahydrozoline HCl (REDNESS RELIEVER EYE DROPS OP) Place 1 drop into both eyes daily as needed (redness).     . Tiotropium Bromide Monohydrate (SPIRIVA RESPIMAT) 2.5 MCG/ACT AERS Inhale 2 puffs into the lungs daily.    05/19/2018 at Unknown time   No current facility-administered medications for this visit.    Allergies  Allergen Reactions  . Amoxicillin-Pot Clavulanate Itching    Ok  with benadryl Did it involve swelling of the face/tongue/throat, SOB, or low BP? No Did it involve sudden or severe rash/hives, skin peeling, or any reaction on the inside of your mouth or nose? No Did you need to seek medical attention at a hospital or doctor's office? No When did it last happen?6 months If all above answers are "NO", may proceed with cephalosporin use.   . Erythromycin Rash    Ok with benadryl   . Moxifloxacin Itching    Reaction to Avelox - ok with benadryl  . Sulfamethoxazole-Trimethoprim Itching    Ok with benadryl    Social History   Tobacco Use  . Smoking status: Current Every Day Smoker      Packs/day: 1.00    Years: 30.00    Pack years: 30.00    Types: Cigarettes  . Smokeless tobacco: Never Used  . Tobacco comment: currently smoking 1/2 ppd.   Substance Use Topics  . Alcohol use: Not Currently    Family History  Problem Relation Age of Onset  . Emphysema Father   . Rheum arthritis Father   . Lung cancer Father   . Emphysema Mother      Review of Systems  Constitutional: Negative.   HENT: Negative.   Eyes: Negative.   Respiratory: Positive for shortness of breath.   Cardiovascular: Negative.   Gastrointestinal: Negative.   Genitourinary: Negative.   Musculoskeletal: Positive for back pain and joint pain.  Skin: Negative.   Neurological: Negative.   Endo/Heme/Allergies: Negative.   Psychiatric/Behavioral: Negative.     Objective:  Physical Exam  Vitals reviewed. Constitutional: She is oriented to person, place, and time. She appears well-developed and well-nourished.  HENT:  Head: Normocephalic and atraumatic.  Eyes: Pupils are equal, round, and reactive to light. Conjunctivae and EOM are normal.  Neck: Normal range of motion. Neck supple.  Cardiovascular: Normal rate, regular rhythm and intact distal pulses.  Respiratory: Effort normal. No respiratory distress.  GI: Soft. She exhibits no distension.  Genitourinary:     Genitourinary Comments: deferred   Musculoskeletal:     Left hip: She exhibits decreased range of motion, decreased strength, bony tenderness and crepitus.  Neurological: She is alert and oriented to person, place, and time. She has normal reflexes.  Skin: Skin is warm and dry.  Psychiatric: She has a normal mood and affect. Her behavior is normal. Judgment and thought content normal.    Vital signs in last 24 hours: @VSRANGES @  Labs:   Estimated body mass index is 35.32 kg/m as calculated from the following:   Height as of an earlier encounter on 02/15/19: 5\' 3"  (1.6 m).   Weight as of an earlier encounter on 02/15/19: 90.4 kg.   Imaging Review Plain radiographs demonstrate severe degenerative joint disease of the left hip(s). The bone quality appears to be adequate for age and reported activity level.      Assessment/Plan:  End stage arthritis, left hip(s)  The patient history, physical examination, clinical judgement of the provider and imaging studies are consistent with end stage degenerative joint disease of the left hip(s) and total hip arthroplasty is deemed medically necessary. The treatment options including medical management, injection therapy, arthroscopy and arthroplasty were discussed at length. The risks and benefits of total hip arthroplasty were presented and reviewed. The risks due to aseptic loosening, infection, stiffness, dislocation/subluxation,  thromboembolic complications and other imponderables were discussed.  The patient acknowledged the explanation, agreed to proceed with the plan and consent was signed. Patient is being admitted for inpatient treatment for surgery, pain control, PT, OT, prophylactic antibiotics, VTE prophylaxis, progressive ambulation and ADL's and discharge planning.The patient is planning to be discharged home with HEP   Anticipated LOS equal to or greater than 2 midnights due to - Age 70 and older with one or more of the  following:  - Obesity  - Expected need for hospital services (PT, OT, Nursing) required for safe  discharge  - Anticipated need for postoperative skilled nursing care or inpatient rehab  - Active co-morbidities: Respiratory Failure/COPD OR   - Unanticipated findings during/Post Surgery: None  - Patient is a high risk of re-admission due to: None

## 2019-02-16 LAB — NOVEL CORONAVIRUS, NAA (HOSP ORDER, SEND-OUT TO REF LAB; TAT 18-24 HRS): SARS-CoV-2, NAA: NOT DETECTED

## 2019-02-16 NOTE — Anesthesia Preprocedure Evaluation (Addendum)
Anesthesia Evaluation  Patient identified by MRN, date of birth, ID band Patient awake    History of Anesthesia Complications Negative for: history of anesthetic complications  Airway Mallampati: II  TM Distance: >3 FB Neck ROM: Full    Dental  (+) Dental Advisory Given   Pulmonary COPD,  COPD inhaler and oxygen dependent, Current Smoker,  02/15/2019 novel coronavirus NEG   breath sounds clear to auscultation       Cardiovascular hypertension, Pt. on medications (-) angina Rhythm:Regular Rate:Normal  '18 ECHO: EF 60-65%, mild AS with mean gradient 18 mmHg   Neuro/Psych negative neurological ROS     GI/Hepatic Neg liver ROS, GERD  Medicated and Controlled,  Endo/Other  Morbid obesity  Renal/GU negative Renal ROS     Musculoskeletal   Abdominal (+) + obese,   Peds  Hematology negative hematology ROS (+)   Anesthesia Other Findings   Reproductive/Obstetrics                           Anesthesia Physical Anesthesia Plan  ASA: III  Anesthesia Plan: Spinal   Post-op Pain Management:    Induction:   PONV Risk Score and Plan: 1 and Ondansetron, Dexamethasone and Treatment may vary due to age or medical condition  Airway Management Planned: Natural Airway and Simple Face Mask  Additional Equipment:   Intra-op Plan:   Post-operative Plan:   Informed Consent: I have reviewed the patients History and Physical, chart, labs and discussed the procedure including the risks, benefits and alternatives for the proposed anesthesia with the patient or authorized representative who has indicated his/her understanding and acceptance.     Dental advisory given  Plan Discussed with: CRNA and Surgeon  Anesthesia Plan Comments: (See PAT note 02/15/2019, Konrad Felix, PA-C)      Anesthesia Quick Evaluation

## 2019-02-16 NOTE — Progress Notes (Signed)
Anesthesia Chart Review   Case:  [id number] Date/Time:  02/18/19 0715   Procedure:  TOTAL HIP ARTHROPLASTY ANTERIOR APPROACH (Left )   Anesthesia type:  Spinal   Pre-op diagnosis:  Degenerative joint disease left hip   Location:  [facility] ROOM 08 / [facility]   Surgeon:  Rod Can, MD      DISCUSSION: 53 yo current every day smoker (30 pack years) with h/o asthma, COPD O2 dependent on 4L continuous, sleep apnea, mild aortic stenosis (Mean gradient (S): 18 mm Hg), GERD, HTN, left hip DJD scheduled for above procedure 02/18/19 with Dr. Rod Can.   Per PCP, Dr. Anastasia Pall, OV note 01/26/2019, "Pt is cleared for surgery due to the minimal risk under spinal. Pt may continue taking prednisone 5mg  daily for COPD. I recommended that she use OTC nasal spray for her cough." VS: BP 137/86 (BP Location: Right Arm)   Pulse 96   Temp 37 C (Oral)   Resp 18   Ht 5\' 3"  (1.6 m)   Wt 90.4 kg   SpO2 95%   BMI 35.32 kg/m   PROVIDERS: Chesley Noon, MD is PCP   Dionne Milo, MD is Pulmonologist  LABS: Labs reviewed: Acceptable for surgery. (all labs ordered are listed, but only abnormal results are displayed)  Labs Reviewed  CBC - Abnormal; Notable for the following components:      Result Value   WBC 15.6 (*)    RBC 5.36 (*)    HCT 48.9 (*)    All other components within normal limits  COMPREHENSIVE METABOLIC PANEL - Abnormal; Notable for the following components:   Glucose, Bld 111 (*)    All other components within normal limits  URINALYSIS, ROUTINE W REFLEX MICROSCOPIC - Abnormal; Notable for the following components:   Color, Urine STRAW (*)    Specific Gravity, Urine 1.002 (*)    All other components within normal limits  SURGICAL PCR SCREEN  APTT  PROTIME-INR  TYPE AND SCREEN  ABO/RH     IMAGES:   EKG: 02/15/2019 Rate 100  Normal sinus rhythm   CV: Echo 07/21/2017 Study Conclusions  - Left ventricle: The cavity size was normal. Systolic function was   normal.  The estimated ejection fraction was in the range of 60%   to 65%. Wall motion was normal; there were no regional wall   motion abnormalities. Left ventricular diastolic function   parameters were normal. - Aortic valve: Poorly visualized. Trileaflet; normal thickness,   mildly calcified leaflets. There was mild stenosis. Mean gradient   (S): 18 mm Hg. - Pulmonary arteries: Systolic pressure could not be accurately   estimated. - Pericardium, extracardiac: A prominent pericardial fat pad was   present.  Past Medical History:  Diagnosis Date  . Allergic rhinitis   . Asthma   . COPD (chronic obstructive pulmonary disease) ([place])    on home O2  . GERD (gastroesophageal reflux disease)   . Hypertension   . Shortness of breath   . Sleep apnea    NO DEVICE IN USE, USES SUPPLEMENTAL O2 IN PLACE OF DEVICE   . Supplemental oxygen dependent    4L CONTINUOUS     Past Surgical History:  Procedure Laterality Date  . HUMERUS IM NAIL Left 07/23/2017   Procedure: INTRAMEDULLARY (IM) NAIL HUMERAL;  Surgeon: Nicholes Stairs, MD;  Location: [place];  Service: Orthopedics;  Laterality: Left;  . LUMBAR LAMINECTOMY/DECOMPRESSION MICRODISCECTOMY  04/06/2012   Procedure: LUMBAR LAMINECTOMY/DECOMPRESSION MICRODISCECTOMY;  Surgeon: Jeneen Rinks  Lynne LoganE Nitka, MD;  Location: [facility];  Service: Orthopedics;  Laterality: N/A;  Left L3-4 and L4-5 lateral recess decompression MIS  . NECK SURGERY  Sept 2005  . TOTAL ABDOMINAL HYSTERECTOMY  Oct 2003  . TUBAL LIGATION  1995    MEDICATIONS: . albuterol (PROVENTIL) (2.5 MG/3ML) 0.083% nebulizer solution  . ALPRAZolam (XANAX) 0.5 MG tablet  . azithromycin (ZITHROMAX) 250 MG tablet  . budesonide-formoterol (SYMBICORT) 160-4.5 MCG/ACT inhaler  . cetirizine (ZYRTEC) 10 MG tablet  . esomeprazole (NEXIUM) 40 MG capsule  . FLUoxetine (PROZAC) 40 MG capsule  . fluticasone (FLONASE) 50 MCG/ACT nasal spray  . gabapentin (NEURONTIN) 400 MG capsule  .  guaiFENesin-dextromethorphan (ROBITUSSIN DM) 100-10 MG/5ML syrup  . HYDROcodone-acetaminophen (NORCO) 10-325 MG tablet  . loperamide (IMODIUM A-D) 2 MG tablet  . losartan (COZAAR) 100 MG tablet  . montelukast (SINGULAIR) 10 MG tablet  . morphine (MS CONTIN) 15 MG 12 hr tablet  . naproxen sodium (ALEVE) 220 MG tablet  . nystatin (MYCOSTATIN) 100000 UNIT/ML suspension  . nystatin (NYSTATIN) powder  . OXYGEN  . predniSONE (DELTASONE) 10 MG tablet  . Tetrahydrozoline HCl (REDNESS RELIEVER EYE DROPS OP)  . Tiotropium Bromide Monohydrate (SPIRIVA RESPIMAT) 2.5 MCG/ACT AERS   No current facility-administered medications for this encounter.     Janey GentaJessica Quintin Hjort, PA-C [facility] [phone] 02/16/19 12:01 PM

## 2019-02-17 NOTE — Progress Notes (Signed)
SPOKE W/  _drinda Goodgame Surg.time confirmed also     SCREENING SYMPTOMS OF COVID 19:   COUGH--no  RUNNY NOSE--- no  SORE THROAT---no  NASAL CONGESTION----no  SNEEZING----no  SHORTNESS OF BREATH---no  DIFFICULTY BREATHING---no  TEMP >100.0 -----no  UNEXPLAINED BODY ACHES------no  CHILLS -------- no  HEADACHES ---------no  LOSS OF SMELL/ TASTE --------no    HAVE YOU OR ANY FAMILY MEMBER TRAVELLED PAST 14 DAYS OUT OF THE   COUNTY---no STATE----no COUNTRY----no  HAVE YOU OR ANY FAMILY MEMBER BEEN EXPOSED TO ANYONE WITH COVID 19?  no

## 2019-02-18 ENCOUNTER — Inpatient Hospital Stay (HOSPITAL_COMMUNITY)
Admission: RE | Admit: 2019-02-18 | Discharge: 2019-02-20 | DRG: 470 | Disposition: A | Discharge status: Home / Self Care | Payer: Medicare Other | Attending: Orthopedic Surgery | Admitting: Orthopedic Surgery

## 2019-02-18 ENCOUNTER — Inpatient Hospital Stay (HOSPITAL_COMMUNITY): Payer: Medicare Other

## 2019-02-18 ENCOUNTER — Encounter (HOSPITAL_COMMUNITY): Payer: Self-pay | Admitting: *Deleted

## 2019-02-18 ENCOUNTER — Encounter (HOSPITAL_COMMUNITY)
Admission: RE | Disposition: A | Discharge status: Home / Self Care | Payer: Self-pay | Source: Home / Self Care | Attending: Orthopedic Surgery

## 2019-02-18 ENCOUNTER — Ambulatory Visit (HOSPITAL_COMMUNITY): Payer: Medicare Other | Admitting: Anesthesiology

## 2019-02-18 ENCOUNTER — Ambulatory Visit (HOSPITAL_COMMUNITY): Payer: Medicare Other

## 2019-02-18 ENCOUNTER — Other Ambulatory Visit: Payer: Self-pay

## 2019-02-18 ENCOUNTER — Ambulatory Visit (HOSPITAL_COMMUNITY): Payer: Medicare Other | Admitting: Physician Assistant

## 2019-02-18 DIAGNOSIS — Z9181 History of falling: Secondary | ICD-10-CM

## 2019-02-18 DIAGNOSIS — Z1159 Encounter for screening for other viral diseases: Secondary | ICD-10-CM | POA: Diagnosis not present

## 2019-02-18 DIAGNOSIS — F17210 Nicotine dependence, cigarettes, uncomplicated: Secondary | ICD-10-CM | POA: Diagnosis present

## 2019-02-18 DIAGNOSIS — Z79899 Other long term (current) drug therapy: Secondary | ICD-10-CM | POA: Diagnosis not present

## 2019-02-18 DIAGNOSIS — J9611 Chronic respiratory failure with hypoxia: Secondary | ICD-10-CM | POA: Diagnosis present

## 2019-02-18 DIAGNOSIS — K219 Gastro-esophageal reflux disease without esophagitis: Secondary | ICD-10-CM | POA: Diagnosis present

## 2019-02-18 DIAGNOSIS — Z882 Allergy status to sulfonamides status: Secondary | ICD-10-CM

## 2019-02-18 DIAGNOSIS — B3781 Candidal esophagitis: Secondary | ICD-10-CM | POA: Diagnosis not present

## 2019-02-18 DIAGNOSIS — Z6835 Body mass index (BMI) 35.0-35.9, adult: Secondary | ICD-10-CM | POA: Diagnosis not present

## 2019-02-18 DIAGNOSIS — Z801 Family history of malignant neoplasm of trachea, bronchus and lung: Secondary | ICD-10-CM

## 2019-02-18 DIAGNOSIS — Z90710 Acquired absence of both cervix and uterus: Secondary | ICD-10-CM

## 2019-02-18 DIAGNOSIS — Z09 Encounter for follow-up examination after completed treatment for conditions other than malignant neoplasm: Secondary | ICD-10-CM

## 2019-02-18 DIAGNOSIS — I10 Essential (primary) hypertension: Secondary | ICD-10-CM | POA: Diagnosis present

## 2019-02-18 DIAGNOSIS — Z7951 Long term (current) use of inhaled steroids: Secondary | ICD-10-CM | POA: Diagnosis not present

## 2019-02-18 DIAGNOSIS — Z881 Allergy status to other antibiotic agents status: Secondary | ICD-10-CM | POA: Diagnosis not present

## 2019-02-18 DIAGNOSIS — Z9981 Dependence on supplemental oxygen: Secondary | ICD-10-CM | POA: Diagnosis not present

## 2019-02-18 DIAGNOSIS — G4730 Sleep apnea, unspecified: Secondary | ICD-10-CM | POA: Diagnosis present

## 2019-02-18 DIAGNOSIS — M1612 Unilateral primary osteoarthritis, left hip: Secondary | ICD-10-CM | POA: Diagnosis present

## 2019-02-18 DIAGNOSIS — Z825 Family history of asthma and other chronic lower respiratory diseases: Secondary | ICD-10-CM | POA: Diagnosis not present

## 2019-02-18 DIAGNOSIS — Z880 Allergy status to penicillin: Secondary | ICD-10-CM | POA: Diagnosis not present

## 2019-02-18 DIAGNOSIS — J449 Chronic obstructive pulmonary disease, unspecified: Secondary | ICD-10-CM | POA: Diagnosis present

## 2019-02-18 DIAGNOSIS — Z9851 Tubal ligation status: Secondary | ICD-10-CM

## 2019-02-18 DIAGNOSIS — Z836 Family history of other diseases of the respiratory system: Secondary | ICD-10-CM

## 2019-02-18 DIAGNOSIS — Z419 Encounter for procedure for purposes other than remedying health state, unspecified: Secondary | ICD-10-CM

## 2019-02-18 HISTORY — PX: TOTAL HIP ARTHROPLASTY: SHX124

## 2019-02-18 LAB — TYPE AND SCREEN
ABO/RH(D): B NEG
Antibody Screen: NEGATIVE

## 2019-02-18 SURGERY — ARTHROPLASTY, HIP, TOTAL, ANTERIOR APPROACH
Anesthesia: Spinal | Laterality: Left

## 2019-02-18 MED ORDER — HYDROMORPHONE HCL 1 MG/ML IJ SOLN
INTRAMUSCULAR | Status: AC
  Filled 2019-02-18: qty 1

## 2019-02-18 MED ORDER — ONDANSETRON HCL 4 MG/2ML IJ SOLN
INTRAMUSCULAR | Status: DC | PRN
  Administered 2019-02-18: 4 mg via INTRAVENOUS

## 2019-02-18 MED ORDER — HYDROMORPHONE HCL 1 MG/ML IJ SOLN
INTRAMUSCULAR | Status: AC
  Administered 2019-02-18: 12:00:00 0.5 mg via INTRAVENOUS
  Filled 2019-02-18: qty 1

## 2019-02-18 MED ORDER — LACTATED RINGERS IV SOLN
INTRAVENOUS | Status: DC
  Administered 2019-02-18 (×2): via INTRAVENOUS

## 2019-02-18 MED ORDER — MOMETASONE FURO-FORMOTEROL FUM 200-5 MCG/ACT IN AERO
2.0000 | INHALATION_SPRAY | Freq: Two times a day (BID) | RESPIRATORY_TRACT | Status: DC
  Administered 2019-02-18 – 2019-02-20 (×4): 2 via RESPIRATORY_TRACT
  Filled 2019-02-18: qty 8.8

## 2019-02-18 MED ORDER — POVIDONE-IODINE 10 % EX SWAB: 2.0000 "application " | Freq: Once | CUTANEOUS | Status: DC

## 2019-02-18 MED ORDER — KETOROLAC TROMETHAMINE 30 MG/ML IJ SOLN
INTRAMUSCULAR | Status: AC
  Filled 2019-02-18: qty 1

## 2019-02-18 MED ORDER — ORAL CARE MOUTH RINSE
15.0000 mL | Freq: Two times a day (BID) | OROMUCOSAL | Status: DC
  Administered 2019-02-18 – 2019-02-20 (×4): 15 mL via OROMUCOSAL

## 2019-02-18 MED ORDER — SODIUM CHLORIDE 0.9 % IV SOLN
INTRAVENOUS | Status: DC
  Administered 2019-02-18: 12:00:00 via INTRAVENOUS

## 2019-02-18 MED ORDER — HYDROCORTISONE NA SUCCINATE PF 100 MG IJ SOLR
INTRAMUSCULAR | Status: AC
  Administered 2019-02-18: 100 mg
  Filled 2019-02-18: qty 2

## 2019-02-18 MED ORDER — OXYCODONE HCL 5 MG PO TABS
5.0000 mg | ORAL_TABLET | ORAL | Status: DC | PRN
  Administered 2019-02-20: 10 mg via ORAL
  Filled 2019-02-18: qty 2

## 2019-02-18 MED ORDER — MIDAZOLAM HCL 2 MG/2ML IJ SOLN: 0.5000 mg | Freq: Once | INTRAMUSCULAR | Status: DC | PRN

## 2019-02-18 MED ORDER — ONDANSETRON HCL 4 MG PO TABS: 4.0000 mg | ORAL_TABLET | Freq: Four times a day (QID) | ORAL | Status: DC | PRN

## 2019-02-18 MED ORDER — METHOCARBAMOL 500 MG PO TABS
500.0000 mg | ORAL_TABLET | Freq: Four times a day (QID) | ORAL | Status: DC | PRN
  Administered 2019-02-18 – 2019-02-19 (×3): 500 mg via ORAL
  Filled 2019-02-18 (×3): qty 1

## 2019-02-18 MED ORDER — BUPIVACAINE IN DEXTROSE 0.75-8.25 % IT SOLN
INTRATHECAL | Status: DC | PRN
  Administered 2019-02-18: 1.8 mL via INTRATHECAL

## 2019-02-18 MED ORDER — FLUOXETINE HCL 20 MG PO CAPS
40.0000 mg | ORAL_CAPSULE | Freq: Every day | ORAL | Status: DC
  Administered 2019-02-18 – 2019-02-20 (×3): 40 mg via ORAL
  Filled 2019-02-18 (×3): qty 2

## 2019-02-18 MED ORDER — ISOPROPYL ALCOHOL 70 % SOLN
Status: DC | PRN
  Administered 2019-02-18: 1 via TOPICAL

## 2019-02-18 MED ORDER — WATER FOR IRRIGATION, STERILE IR SOLN
Status: DC | PRN
  Administered 2019-02-18 (×2): 1000 mL

## 2019-02-18 MED ORDER — MIDAZOLAM HCL 5 MG/5ML IJ SOLN
INTRAMUSCULAR | Status: DC | PRN
  Administered 2019-02-18: 2 mg via INTRAVENOUS

## 2019-02-18 MED ORDER — SODIUM CHLORIDE (PF) 0.9 % IJ SOLN
INTRAMUSCULAR | Status: AC
  Filled 2019-02-18: qty 50

## 2019-02-18 MED ORDER — HYDROMORPHONE HCL 1 MG/ML IJ SOLN
0.2500 mg | INTRAMUSCULAR | Status: DC | PRN
  Administered 2019-02-18 (×3): 0.5 mg via INTRAVENOUS

## 2019-02-18 MED ORDER — PROPOFOL 10 MG/ML IV BOLUS
INTRAVENOUS | Status: AC
  Filled 2019-02-18: qty 80

## 2019-02-18 MED ORDER — SODIUM CHLORIDE 0.9 % IV SOLN
INTRAVENOUS | Status: DC
  Administered 2019-02-18 – 2019-02-19 (×3): via INTRAVENOUS

## 2019-02-18 MED ORDER — DOCUSATE SODIUM 100 MG PO CAPS
100.0000 mg | ORAL_CAPSULE | Freq: Two times a day (BID) | ORAL | Status: DC
  Administered 2019-02-18 – 2019-02-20 (×4): 100 mg via ORAL
  Filled 2019-02-18 (×4): qty 1

## 2019-02-18 MED ORDER — PHENOL 1.4 % MT LIQD: 1.0000 | OROMUCOSAL | Status: DC | PRN

## 2019-02-18 MED ORDER — SODIUM CHLORIDE 0.9 % IV SOLN
INTRAVENOUS | Status: DC | PRN
  Administered 2019-02-18: 08:00:00 35 ug/min via INTRAVENOUS

## 2019-02-18 MED ORDER — FENTANYL CITRATE (PF) 100 MCG/2ML IJ SOLN
INTRAMUSCULAR | Status: AC
  Filled 2019-02-18: qty 2

## 2019-02-18 MED ORDER — PROPOFOL 500 MG/50ML IV EMUL
INTRAVENOUS | Status: DC | PRN
  Administered 2019-02-18: 75 ug/kg/min via INTRAVENOUS

## 2019-02-18 MED ORDER — FENTANYL CITRATE (PF) 100 MCG/2ML IJ SOLN
INTRAMUSCULAR | Status: DC | PRN
  Administered 2019-02-18: 50 ug via INTRAVENOUS
  Administered 2019-02-18 (×2): 25 ug via INTRAVENOUS

## 2019-02-18 MED ORDER — ACETAMINOPHEN 10 MG/ML IV SOLN
1000.0000 mg | INTRAVENOUS | Status: AC
  Administered 2019-02-18: 09:00:00 1000 mg via INTRAVENOUS
  Filled 2019-02-18: qty 100

## 2019-02-18 MED ORDER — DIPHENHYDRAMINE HCL 12.5 MG/5ML PO ELIX: 12.5000 mg | ORAL_SOLUTION | ORAL | Status: DC | PRN

## 2019-02-18 MED ORDER — ONDANSETRON HCL 4 MG/2ML IJ SOLN: 4.0000 mg | Freq: Four times a day (QID) | INTRAMUSCULAR | Status: DC | PRN

## 2019-02-18 MED ORDER — CHLORHEXIDINE GLUCONATE 4 % EX LIQD: 60.0000 mL | Freq: Once | CUTANEOUS | Status: DC

## 2019-02-18 MED ORDER — BUPIVACAINE-EPINEPHRINE (PF) 0.25% -1:200000 IJ SOLN
INTRAMUSCULAR | Status: AC
  Filled 2019-02-18: qty 30

## 2019-02-18 MED ORDER — ACETAMINOPHEN 325 MG PO TABS
325.0000 mg | ORAL_TABLET | Freq: Four times a day (QID) | ORAL | Status: DC | PRN
  Administered 2019-02-19 (×2): 650 mg via ORAL
  Filled 2019-02-18 (×2): qty 2

## 2019-02-18 MED ORDER — ALPRAZOLAM 0.25 MG PO TABS: 0.2500 mg | ORAL_TABLET | Freq: Every day | ORAL | Status: DC | PRN

## 2019-02-18 MED ORDER — SODIUM CHLORIDE 0.9 % IR SOLN
Status: DC | PRN
  Administered 2019-02-18: 1000 mL
  Administered 2019-02-18: 3000 mL

## 2019-02-18 MED ORDER — ALUM & MAG HYDROXIDE-SIMETH 200-200-20 MG/5ML PO SUSP: 30.0000 mL | ORAL | Status: DC | PRN

## 2019-02-18 MED ORDER — METOCLOPRAMIDE HCL 5 MG PO TABS: 5.0000 mg | ORAL_TABLET | Freq: Three times a day (TID) | ORAL | Status: DC | PRN

## 2019-02-18 MED ORDER — DEXAMETHASONE SODIUM PHOSPHATE 10 MG/ML IJ SOLN
INTRAMUSCULAR | Status: DC | PRN
  Administered 2019-02-18: 10 mg via INTRAVENOUS

## 2019-02-18 MED ORDER — UMECLIDINIUM BROMIDE 62.5 MCG/INH IN AEPB
1.0000 | INHALATION_SPRAY | Freq: Every day | RESPIRATORY_TRACT | Status: DC
  Administered 2019-02-19 – 2019-02-20 (×2): 1 via RESPIRATORY_TRACT
  Filled 2019-02-18: qty 7

## 2019-02-18 MED ORDER — KETOROLAC TROMETHAMINE 30 MG/ML IJ SOLN
INTRAMUSCULAR | Status: DC | PRN
  Administered 2019-02-18: 30 mg

## 2019-02-18 MED ORDER — LORATADINE 10 MG PO TABS
10.0000 mg | ORAL_TABLET | Freq: Every day | ORAL | Status: DC
  Administered 2019-02-19 – 2019-02-20 (×2): 10 mg via ORAL
  Filled 2019-02-18 (×2): qty 1

## 2019-02-18 MED ORDER — PHENYLEPHRINE 40 MCG/ML (10ML) SYRINGE FOR IV PUSH (FOR BLOOD PRESSURE SUPPORT)
PREFILLED_SYRINGE | INTRAVENOUS | Status: DC | PRN
  Administered 2019-02-18 (×5): 80 ug via INTRAVENOUS

## 2019-02-18 MED ORDER — IPRATROPIUM-ALBUTEROL 0.5-2.5 (3) MG/3ML IN SOLN
3.0000 mL | Freq: Four times a day (QID) | RESPIRATORY_TRACT | Status: DC
  Administered 2019-02-19 – 2019-02-20 (×6): 3 mL via RESPIRATORY_TRACT
  Filled 2019-02-18 (×6): qty 3

## 2019-02-18 MED ORDER — PROPOFOL 10 MG/ML IV BOLUS
INTRAVENOUS | Status: AC
  Filled 2019-02-18: qty 40

## 2019-02-18 MED ORDER — KETOROLAC TROMETHAMINE 15 MG/ML IJ SOLN
15.0000 mg | Freq: Four times a day (QID) | INTRAMUSCULAR | Status: AC
  Administered 2019-02-18 – 2019-02-19 (×4): 15 mg via INTRAVENOUS
  Filled 2019-02-18 (×4): qty 1

## 2019-02-18 MED ORDER — GABAPENTIN 400 MG PO CAPS
400.0000 mg | ORAL_CAPSULE | Freq: Three times a day (TID) | ORAL | Status: DC
  Administered 2019-02-18 – 2019-02-20 (×6): 400 mg via ORAL
  Filled 2019-02-18 (×6): qty 1

## 2019-02-18 MED ORDER — MONTELUKAST SODIUM 10 MG PO TABS
10.0000 mg | ORAL_TABLET | Freq: Every day | ORAL | Status: DC
  Administered 2019-02-18 – 2019-02-19 (×2): 10 mg via ORAL
  Filled 2019-02-18 (×2): qty 1

## 2019-02-18 MED ORDER — HYDROMORPHONE HCL 1 MG/ML IJ SOLN
0.5000 mg | INTRAMUSCULAR | Status: DC | PRN
  Administered 2019-02-18: 17:00:00 1 mg via INTRAVENOUS
  Filled 2019-02-18 (×2): qty 1

## 2019-02-18 MED ORDER — POVIDONE-IODINE 10 % EX SWAB
2.0000 "application " | Freq: Once | CUTANEOUS | Status: AC
  Administered 2019-02-18: 2 via TOPICAL

## 2019-02-18 MED ORDER — PANTOPRAZOLE SODIUM 40 MG PO TBEC
80.0000 mg | DELAYED_RELEASE_TABLET | Freq: Every day | ORAL | Status: DC
  Administered 2019-02-18 – 2019-02-19 (×2): 80 mg via ORAL
  Filled 2019-02-18 (×2): qty 2

## 2019-02-18 MED ORDER — METHOCARBAMOL 500 MG IVPB - SIMPLE MED
500.0000 mg | Freq: Four times a day (QID) | INTRAVENOUS | Status: DC | PRN
  Administered 2019-02-18: 11:00:00 500 mg via INTRAVENOUS
  Filled 2019-02-18: qty 50

## 2019-02-18 MED ORDER — METHOCARBAMOL 500 MG IVPB - SIMPLE MED
INTRAVENOUS | Status: AC
  Filled 2019-02-18: qty 50

## 2019-02-18 MED ORDER — FLUTICASONE PROPIONATE 50 MCG/ACT NA SUSP
1.0000 | Freq: Every day | NASAL | Status: DC | PRN
  Administered 2019-02-18: 1 via NASAL
  Filled 2019-02-18: qty 16

## 2019-02-18 MED ORDER — ONDANSETRON HCL 4 MG/2ML IJ SOLN
INTRAMUSCULAR | Status: AC
  Filled 2019-02-18: qty 6

## 2019-02-18 MED ORDER — HYDROCORTISONE NA SUCCINATE PF 100 MG IJ SOLR
25.0000 mg | Freq: Three times a day (TID) | INTRAMUSCULAR | Status: AC
  Administered 2019-02-18 – 2019-02-19 (×3): 25 mg via INTRAVENOUS
  Filled 2019-02-18 (×3): qty 0.5

## 2019-02-18 MED ORDER — PHENYLEPHRINE 40 MCG/ML (10ML) SYRINGE FOR IV PUSH (FOR BLOOD PRESSURE SUPPORT)
PREFILLED_SYRINGE | INTRAVENOUS | Status: AC
  Filled 2019-02-18: qty 10

## 2019-02-18 MED ORDER — POLYETHYLENE GLYCOL 3350 17 G PO PACK: 17.0000 g | PACK | Freq: Every day | ORAL | Status: DC | PRN

## 2019-02-18 MED ORDER — TRANEXAMIC ACID-NACL 1000-0.7 MG/100ML-% IV SOLN
1000.0000 mg | INTRAVENOUS | Status: AC
  Administered 2019-02-18: 08:00:00 1000 mg via INTRAVENOUS
  Filled 2019-02-18: qty 100

## 2019-02-18 MED ORDER — MEPERIDINE HCL 50 MG/ML IJ SOLN: 6.2500 mg | INTRAMUSCULAR | Status: DC | PRN

## 2019-02-18 MED ORDER — MENTHOL 3 MG MT LOZG: 1.0000 | LOZENGE | OROMUCOSAL | Status: DC | PRN

## 2019-02-18 MED ORDER — HYDROCORTISONE NA SUCCINATE PF 100 MG IJ SOLR: 100.0000 mg | Freq: Once | INTRAMUSCULAR | Status: AC

## 2019-02-18 MED ORDER — OXYCODONE HCL 5 MG PO TABS
10.0000 mg | ORAL_TABLET | ORAL | Status: DC | PRN
  Administered 2019-02-18 – 2019-02-20 (×7): 15 mg via ORAL
  Filled 2019-02-18 (×8): qty 3

## 2019-02-18 MED ORDER — TETRAHYDROZOLINE HCL 0.05 % OP SOLN
1.0000 [drp] | Freq: Every day | OPHTHALMIC | Status: DC | PRN
  Filled 2019-02-18: qty 15

## 2019-02-18 MED ORDER — METOCLOPRAMIDE HCL 5 MG/ML IJ SOLN: 5.0000 mg | Freq: Three times a day (TID) | INTRAMUSCULAR | Status: DC | PRN

## 2019-02-18 MED ORDER — ASPIRIN 81 MG PO CHEW
81.0000 mg | CHEWABLE_TABLET | Freq: Two times a day (BID) | ORAL | Status: DC
  Administered 2019-02-18 – 2019-02-20 (×4): 81 mg via ORAL
  Filled 2019-02-18 (×4): qty 1

## 2019-02-18 MED ORDER — LOSARTAN POTASSIUM 50 MG PO TABS
100.0000 mg | ORAL_TABLET | Freq: Every day | ORAL | Status: DC
  Administered 2019-02-19 – 2019-02-20 (×2): 100 mg via ORAL
  Filled 2019-02-18 (×2): qty 2

## 2019-02-18 MED ORDER — AZITHROMYCIN 250 MG PO TABS
250.0000 mg | ORAL_TABLET | Freq: Every day | ORAL | Status: DC
  Administered 2019-02-19 – 2019-02-20 (×2): 250 mg via ORAL
  Filled 2019-02-18 (×2): qty 1

## 2019-02-18 MED ORDER — DEXAMETHASONE SODIUM PHOSPHATE 10 MG/ML IJ SOLN
INTRAMUSCULAR | Status: AC
  Filled 2019-02-18: qty 3

## 2019-02-18 MED ORDER — MIDAZOLAM HCL 2 MG/2ML IJ SOLN
INTRAMUSCULAR | Status: AC
  Filled 2019-02-18: qty 2

## 2019-02-18 MED ORDER — SODIUM CHLORIDE (PF) 0.9 % IJ SOLN
INTRAMUSCULAR | Status: DC | PRN
  Administered 2019-02-18: 30 mL

## 2019-02-18 MED ORDER — SODIUM CHLORIDE 0.9 % IR SOLN
Status: DC | PRN
  Administered 2019-02-18: 1000 mL

## 2019-02-18 MED ORDER — CEFAZOLIN SODIUM-DEXTROSE 2-4 GM/100ML-% IV SOLN
2.0000 g | INTRAVENOUS | Status: AC
  Administered 2019-02-18: 08:00:00 2 g via INTRAVENOUS
  Filled 2019-02-18: qty 100

## 2019-02-18 MED ORDER — ALBUTEROL SULFATE (2.5 MG/3ML) 0.083% IN NEBU
2.5000 mg | INHALATION_SOLUTION | RESPIRATORY_TRACT | Status: DC | PRN
  Administered 2019-02-18 – 2019-02-19 (×2): 2.5 mg via RESPIRATORY_TRACT
  Filled 2019-02-18 (×2): qty 3

## 2019-02-18 MED ORDER — SENNA 8.6 MG PO TABS
1.0000 | ORAL_TABLET | Freq: Two times a day (BID) | ORAL | Status: DC
  Administered 2019-02-18 – 2019-02-20 (×4): 8.6 mg via ORAL
  Filled 2019-02-18 (×4): qty 1

## 2019-02-18 MED ORDER — PHENYLEPHRINE HCL (PRESSORS) 10 MG/ML IV SOLN
INTRAVENOUS | Status: AC
  Filled 2019-02-18: qty 1

## 2019-02-18 MED ORDER — LOPERAMIDE HCL 2 MG PO CAPS: 4.0000 mg | ORAL_CAPSULE | ORAL | Status: DC | PRN

## 2019-02-18 MED ORDER — TIOTROPIUM BROMIDE MONOHYDRATE 2.5 MCG/ACT IN AERS: 2.0000 | INHALATION_SPRAY | Freq: Every day | RESPIRATORY_TRACT | Status: DC

## 2019-02-18 MED ORDER — MORPHINE SULFATE ER 15 MG PO TBCR
15.0000 mg | EXTENDED_RELEASE_TABLET | Freq: Three times a day (TID) | ORAL | Status: DC
  Administered 2019-02-18 – 2019-02-20 (×6): 15 mg via ORAL
  Filled 2019-02-18 (×6): qty 1

## 2019-02-18 MED ORDER — GUAIFENESIN-DM 100-10 MG/5ML PO SYRP
15.0000 mL | ORAL_SOLUTION | Freq: Four times a day (QID) | ORAL | Status: DC | PRN
  Administered 2019-02-19 – 2019-02-20 (×5): 15 mL via ORAL
  Filled 2019-02-18 (×5): qty 20

## 2019-02-18 MED ORDER — PROMETHAZINE HCL 25 MG/ML IJ SOLN: 6.2500 mg | INTRAMUSCULAR | Status: DC | PRN

## 2019-02-18 SURGICAL SUPPLY — 62 items
ADH SKN CLS APL DERMABOND .7 (GAUZE/BANDAGES/DRESSINGS) ×2
APL PRP STRL LF DISP 70% ISPRP (MISCELLANEOUS) ×1
BALL HIP CERAMIC (Hips) IMPLANT
CHLORAPREP W/TINT 26 (MISCELLANEOUS) ×2 IMPLANT
COVER PERINEAL POST (MISCELLANEOUS) ×2 IMPLANT
COVER SURGICAL LIGHT HANDLE (MISCELLANEOUS) ×2 IMPLANT
COVER WAND RF STERILE (DRAPES) IMPLANT
DECANTER SPIKE VIAL GLASS SM (MISCELLANEOUS) ×3 IMPLANT
DERMABOND ADVANCED (GAUZE/BANDAGES/DRESSINGS) ×2
DERMABOND ADVANCED .7 DNX12 (GAUZE/BANDAGES/DRESSINGS) ×2 IMPLANT
DRAPE IMP U-DRAPE 54X76 (DRAPES) ×2 IMPLANT
DRAPE SHEET LG 3/4 BI-LAMINATE (DRAPES) ×6 IMPLANT
DRAPE STERI IOBAN 125X83 (DRAPES) ×2 IMPLANT
DRAPE U-SHAPE 47X51 STRL (DRAPES) ×4 IMPLANT
DRESSING AQUACEL AG SP 3.5X10 (GAUZE/BANDAGES/DRESSINGS) IMPLANT
DRSG AQUACEL AG ADV 3.5X10 (GAUZE/BANDAGES/DRESSINGS) ×2 IMPLANT
DRSG AQUACEL AG SP 3.5X10 (GAUZE/BANDAGES/DRESSINGS) ×2
ELECT BLADE TIP CTD 4 INCH (ELECTRODE) ×2 IMPLANT
ELECT PENCIL ROCKER SW 15FT (MISCELLANEOUS) ×2 IMPLANT
ELECT REM PT RETURN 15FT ADLT (MISCELLANEOUS) ×2 IMPLANT
GAUZE SPONGE 4X4 12PLY STRL (GAUZE/BANDAGES/DRESSINGS) ×2 IMPLANT
GLOVE BIO SURGEON STRL SZ8.5 (GLOVE) ×4 IMPLANT
GLOVE BIOGEL PI IND STRL 8.5 (GLOVE) ×1 IMPLANT
GLOVE BIOGEL PI INDICATOR 8.5 (GLOVE) ×1
GOWN SPEC L3 XXLG W/TWL (GOWN DISPOSABLE) ×2 IMPLANT
HANDPIECE INTERPULSE COAX TIP (DISPOSABLE) ×2
HIP BALL CERAMIC (Hips) ×2 IMPLANT
HOLDER FOLEY CATH W/STRAP (MISCELLANEOUS) ×2 IMPLANT
HOOD PEEL AWAY FLYTE STAYCOOL (MISCELLANEOUS) ×8 IMPLANT
JET LAVAGE IRRISEPT WOUND (IRRIGATION / IRRIGATOR) ×2
KIT TURNOVER KIT A (KITS) IMPLANT
LAVAGE JET IRRISEPT WOUND (IRRIGATION / IRRIGATOR) IMPLANT
LINER PINN ACET NEUT 32X52 ×1 IMPLANT
MANIFOLD NEPTUNE II (INSTRUMENTS) ×2 IMPLANT
MARKER SKIN DUAL TIP RULER LAB (MISCELLANEOUS) ×2 IMPLANT
NDL SAFETY ECLIPSE 18X1.5 (NEEDLE) ×1 IMPLANT
NDL SPNL 18GX3.5 QUINCKE PK (NEEDLE) ×1 IMPLANT
NEEDLE HYPO 18GX1.5 SHARP (NEEDLE) ×2
NEEDLE SPNL 18GX3.5 QUINCKE PK (NEEDLE) ×2 IMPLANT
PACK ANTERIOR HIP CUSTOM (KITS) ×2 IMPLANT
PIN SECTOR W/GRIP ACE CUP 52MM (Hips) ×1 IMPLANT
SAW OSC TIP CART 19.5X105X1.3 (SAW) ×2 IMPLANT
SCREW 6.5MMX30MM (Screw) ×2 IMPLANT
SEALER BIPOLAR AQUA 6.0 (INSTRUMENTS) ×2 IMPLANT
SET HNDPC FAN SPRY TIP SCT (DISPOSABLE) ×1 IMPLANT
STAPLER VISISTAT 35W (STAPLE) ×1 IMPLANT
STEM TRI LOC GRIPTION SZ 6 STD IMPLANT
SUT ETHIBOND NAB CT1 #1 30IN (SUTURE) ×4 IMPLANT
SUT MNCRL AB 3-0 PS2 18 (SUTURE) ×2 IMPLANT
SUT MNCRL AB 4-0 PS2 18 (SUTURE) ×2 IMPLANT
SUT MON AB 2-0 CT1 36 (SUTURE) ×4 IMPLANT
SUT STRATAFIX PDO 1 14 VIOLET (SUTURE) ×2
SUT STRATFX PDO 1 14 VIOLET (SUTURE) ×1
SUT VIC AB 2-0 CT1 27 (SUTURE) ×2
SUT VIC AB 2-0 CT1 TAPERPNT 27 (SUTURE) ×1 IMPLANT
SUTURE STRATFX PDO 1 14 VIOLET (SUTURE) ×1 IMPLANT
SYR 3ML LL SCALE MARK (SYRINGE) ×4 IMPLANT
SYR 50ML LL SCALE MARK (SYRINGE) ×2 IMPLANT
TRAY FOLEY CATH 14FRSI W/METER (CATHETERS) ×1 IMPLANT
TRI LOC GRIPTION SZ 6 STD ×2 IMPLANT
WATER STERILE IRR 1000ML POUR (IV SOLUTION) ×2 IMPLANT
YANKAUER SUCT BULB TIP 10FT TU (MISCELLANEOUS) ×2 IMPLANT

## 2019-02-18 NOTE — Anesthesia Procedure Notes (Signed)
Spinal  Patient location during procedure: OR Start time: 02/18/2019 7:40 AM End time: 02/18/2019 7:50 AM Staffing Resident/CRNA: Lavina Hamman, CRNA Performed: resident/CRNA  Preanesthetic Checklist Completed: patient identified, site marked, surgical consent, pre-op evaluation, timeout performed, IV checked, risks and benefits discussed and monitors and equipment checked Spinal Block Patient position: sitting Prep: DuraPrep Patient monitoring: heart rate, cardiac monitor, continuous pulse ox and blood pressure Approach: midline Location: L3-4 Injection technique: single-shot Needle Needle type: Pencan  Needle gauge: 24 G Needle length: 9 cm Needle insertion depth: 7 cm Assessment Sensory level: T6

## 2019-02-18 NOTE — Transfer of Care (Signed)
Immediate Anesthesia Transfer of Care Note  Patient: [name]  Procedure(s) Performed: Procedure(s): TOTAL HIP ARTHROPLASTY ANTERIOR APPROACH (Left)  Patient Location: PACU  Anesthesia Type:Spinal  Level of Consciousness:  sedated, patient cooperative and responds to stimulation  Airway & Oxygen Therapy:Patient Spontanous Breathing and Patient connected to face mask oxgen  Post-op Assessment:  Report given to PACU RN and Post -op Vital signs reviewed and stable  Post vital signs:  Reviewed and stable  Last Vitals:  Vitals:   02/18/19 0555  BP: 127/81  Pulse: (!) 102  Resp: 16  Temp: 36.7 C  SpO2: 33%    Complications: No apparent anesthesia complications

## 2019-02-18 NOTE — Op Note (Signed)
OPERATIVE REPORT  SURGEON: Rod Can, MD   ASSISTANT: Staff.  PREOPERATIVE DIAGNOSIS: Left hip arthritis.   POSTOPERATIVE DIAGNOSIS: Left hip arthritis.   PROCEDURE: Left total hip arthroplasty, anterior approach.   IMPLANTS: DePuy Tri Lock stem, size 6, std offset. DePuy Pinnacle Cup, size 52 mm. DePuy Altrx liner, size 32 by 52 mm, neutral. DePuy Biolox ceramic head ball, size 32 + 5 mm. 6.5 mm cancellous bone screw x2.  ANESTHESIA:  Spinal  ESTIMATED BLOOD LOSS:-500 mL    ANTIBIOTICS: 2 g Ancef.  DRAINS: None.  COMPLICATIONS: None.   CONDITION: PACU - hemodynamically stable.   BRIEF CLINICAL NOTE: [name] is a 53 y.o. female with a long-standing history of Left hip arthritis. After failing conservative management, the patient was indicated for total hip arthroplasty. The risks, benefits, and alternatives to the procedure were explained, and the patient elected to proceed.  PROCEDURE IN DETAIL: Surgical site was marked by myself in the pre-op holding area. Once inside the operating room, spinal anesthesia was obtained, and a foley catheter was inserted. Stress dose steroids were administered. The patient was then positioned on the Hana table. All bony prominences were well padded. The hip was prepped and draped in the normal sterile surgical fashion. A time-out was called verifying side and site of surgery. The patient received IV antibiotics within 60 minutes of beginning the procedure.  The direct anterior approach to the hip was performed through the Hueter interval. Lateral femoral circumflex vessels were treated with the Auqumantys. The anterior capsule was exposed and an inverted T capsulotomy was made.The femoral neck cut was made to the level of the templated cut. A corkscrew was placed into the head and the head was removed. The femoral head was found to have eburnated bone. The head was passed to the back table and was measured.  Acetabular  exposure was achieved, and the pulvinar and labrum were excised. Sequential reaming of the acetabulum was then performed up to a size 51 mm reamer. A 52 mm cup was then opened and impacted into place at approximately 40 degrees of abduction and 20 degrees of anteversion. I elected to augment her already acceptable pressfit fixation with 6.5 mm cancellous bone screws x2. The final polyethylene liner was impacted into place and acetabular osteophytes were removed.   I then gained femoral exposure taking care to protect the abductors and greater trochanter. This was performed using standard external rotation, extension, and adduction. The capsule was peeled off the inner aspect of the greater trochanter, taking care to preserve the short external rotators. A cookie cutter was used to enter the femoral canal, and then the femoral canal finder was placed. Sequential broaching was performed up to a size 6. Calcar planer was used on the femoral neck remnant. I placed a std offset neck and a trial head ball. The hip was reduced. Leg lengths and offset were checked fluoroscopically. The hip was dislocated and trial components were removed. The final implants were placed, and the hip was reduced.  Fluoroscopy was used to confirm component position and leg lengths. At 90 degrees of external rotation and full extension, the hip was stable to an anterior directed force.  The wound was copiously irrigated with Irrisept and normal saline using pulse lavage. Marcaine solution was injected into the periarticular soft tissue. The wound was closed in layers using #1 Vicryl and V-Loc for the fascia, 2-0 Vicryl for the subcutaneous fat, 2-0 Monocryl for the deep dermal ayer, 3-0 running Monocryl  subcuticular stitch, and Dermabond for the skin. Once the glue was fully dried, an Aquacell Ag dressing was applied. The patient was transported to the recovery room in stable condition. Sponge, needle, and instrument counts  were correct at the end of the case x2. The patient tolerated the procedure well and there were no known complications.

## 2019-02-18 NOTE — Anesthesia Postprocedure Evaluation (Signed)
Anesthesia Post Note  Patient: Casandra S Stuckert  Procedure(s) Performed: TOTAL HIP ARTHROPLASTY ANTERIOR APPROACH (Left )     Patient location during evaluation: PACU Anesthesia Type: Spinal Level of consciousness: awake and alert, patient cooperative and oriented Pain management: pain level controlled Vital Signs Assessment: post-procedure vital signs reviewed and stable Respiratory status: nonlabored ventilation, spontaneous breathing, respiratory function stable and patient connected to nasal cannula oxygen Cardiovascular status: blood pressure returned to baseline and stable Postop Assessment: no apparent nausea or vomiting Anesthetic complications: no    Last Vitals:  Vitals:   02/18/19 1258 02/18/19 1425  BP: 103/76 111/79  Pulse: 94 88  Resp: 17 16  Temp: 36.7 C 36.9 C  SpO2: 92% 96%    Last Pain:  Vitals:   02/18/19 1425  TempSrc: Oral  PainSc:                  Farzana Koci,E. Karilyn Wind

## 2019-02-18 NOTE — Interval H&P Note (Signed)
History and Physical Interval Note:  02/18/2019 7:34 AM  [name]  has presented today for surgery, with the diagnosis of Degenerative joint disease left hip.  The various methods of treatment have been discussed with the patient and family. After consideration of risks, benefits and other options for treatment, the patient has consented to  Procedure(s): TOTAL HIP ARTHROPLASTY ANTERIOR APPROACH (Left) as a surgical intervention.  The patient's history has been reviewed, patient examined, no change in status, stable for surgery.  I have reviewed the patient's chart and labs.  Questions were answered to the patient's satisfaction.     Hilton Cork Niya Behler

## 2019-02-18 NOTE — Discharge Instructions (Signed)
°Dr. Jihan Mellette °Joint Replacement Specialist °[facility] °[address] °[place], [place] [address] °[phone] ° ° °TOTAL HIP REPLACEMENT POSTOPERATIVE DIRECTIONS ° ° ° °Hip Rehabilitation, Guidelines Following Surgery  ° °WEIGHT BEARING °Weight bearing as tolerated with assist device (walker, cane, etc) as directed, use it as long as suggested by your surgeon or therapist, typically at least 4-6 weeks. ° °The results of a hip operation are greatly improved after range of motion and muscle strengthening exercises. Follow all safety measures which are given to protect your hip. If any of these exercises cause increased pain or swelling in your joint, decrease the amount until you are comfortable again. Then slowly increase the exercises. Call your caregiver if you have problems or questions.  ° °HOME CARE INSTRUCTIONS  °Most of the following instructions are designed to prevent the dislocation of your new hip.  °Remove items at home which could result in a fall. This includes throw rugs or furniture in walking pathways.  °Continue medications as instructed at time of discharge. °· You may have some home medications which will be placed on hold until you complete the course of blood thinner medication. °· You may start showering once you are discharged home. Do not remove your dressing. °Do not put on socks or shoes without following the instructions of your caregivers.   °Sit on chairs with arms. Use the chair arms to help push yourself up when arising.  °Arrange for the use of a toilet seat elevator so you are not sitting low.  °· Walk with walker as instructed.  °You may resume a sexual relationship in one month or when given the OK by your caregiver.  °Use walker as long as suggested by your caregivers.  °You may put full weight on your legs and walk as much as is comfortable. °Avoid periods of inactivity such as sitting longer than an hour when not asleep. This helps prevent  blood clots.  °You may return to work once you are cleared by your surgeon.  °Do not drive a car for 6 weeks or until released by your surgeon.  °Do not drive while taking narcotics.  °Wear elastic stockings for two weeks following surgery during the day but you may remove then at night.  °Make sure you keep all of your appointments after your operation with all of your doctors and caregivers. You should call the office at the above phone number and make an appointment for approximately two weeks after the date of your surgery. °Please pick up a stool softener and laxative for home use as long as you are requiring pain medications. °· ICE to the affected hip every three hours for 30 minutes at a time and then as needed for pain and swelling. Continue to use ice on the hip for pain and swelling from surgery. You may notice swelling that will progress down to the foot and ankle.  This is normal after surgery.  Elevate the leg when you are not up walking on it.   °It is important for you to complete the blood thinner medication as prescribed by your doctor. °· Continue to use the breathing machine which will help keep your temperature down.  It is common for your temperature to cycle up and down following surgery, especially at night when you are not up moving around and exerting yourself.  The breathing machine keeps your lungs expanded and your temperature down. ° °RANGE OF MOTION AND STRENGTHENING EXERCISES  °These exercises are   designed to help you keep full movement of your hip joint. Follow your caregiver's or physical therapist's instructions. Perform all exercises about fifteen times, three times per day or as directed. Exercise both hips, even if you have had only one joint replacement. These exercises can be done on a training (exercise) mat, on the floor, on a table or on a bed. Use whatever works the best and is most comfortable for you. Use music or television while you are exercising so that the exercises  are a pleasant break in your day. This will make your life better with the exercises acting as a break in routine you can look forward to.  °Lying on your back, slowly slide your foot toward your buttocks, raising your knee up off the floor. Then slowly slide your foot back down until your leg is straight again.  °Lying on your back spread your legs as far apart as you can without causing discomfort.  °Lying on your side, raise your upper leg and foot straight up from the floor as far as is comfortable. Slowly lower the leg and repeat.  °Lying on your back, tighten up the muscle in the front of your thigh (quadriceps muscles). You can do this by keeping your leg straight and trying to raise your heel off the floor. This helps strengthen the largest muscle supporting your knee.  °Lying on your back, tighten up the muscles of your buttocks both with the legs straight and with the knee bent at a comfortable angle while keeping your heel on the floor.  ° °SKILLED REHAB INSTRUCTIONS: °If the patient is transferred to a skilled rehab facility following release from the hospital, a list of the current medications will be sent to the facility for the patient to continue.  When discharged from the skilled rehab facility, please have the facility set up the patient's Home Health Physical Therapy prior to being released. Also, the skilled facility will be responsible for providing the patient with their medications at time of release from the facility to include their pain medication and their blood thinner medication. If the patient is still at the rehab facility at time of the two week follow up appointment, the skilled rehab facility will also need to assist the patient in arranging follow up appointment in our office and any transportation needs. ° °MAKE SURE YOU:  °Understand these instructions.  °Will watch your condition.  °Will get help right away if you are not doing well or get worse. ° °Pick up stool softner and  laxative for home use following surgery while on pain medications. °Do not remove your dressing. °The dressing is waterproof--it is OK to take showers. °Continue to use ice for pain and swelling after surgery. °Do not use any lotions or creams on the incision until instructed by your surgeon. °Total Hip Protocol. ° ° °

## 2019-02-18 NOTE — Evaluation (Signed)
Physical Therapy Evaluation Patient Details Name: [name] MRN: [id number] DOB: [birth year] Today's Date: 02/18/2019   History of Present Illness  53 yo female s/p L DA-THA on 02/18/19. 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  Clinical Impression  Pt presents with L hip pain, decreased L hip strength post-operatively, difficulty performing mobility tasks, dyspnea on exertion, and decreased activity tolerance due to pain. Pt to benefit from acute PT to address deficits. Pt ambulated room distance with RW with min guard assist, verbal cuing for form and safety provided. Pt educated on ankle pumps (20/hour) to perform this afternoon/evening to increase circulation, to pt's tolerance and limited by pain. PT to progress mobility as tolerated, and will continue to follow acutely.        Follow Up Recommendations Follow surgeon's recommendation for DC plan and follow-up therapies;Supervision for mobility/OOB(HEP)    Equipment Recommendations  None recommended by PT    Recommendations for Other Services       Precautions / Restrictions Precautions Precautions: Fall Restrictions Weight Bearing Restrictions: No Other Position/Activity Restrictions: WBAT      Mobility  Bed Mobility Overal bed mobility: Needs Assistance Bed Mobility: Supine to Sit     Supine to sit: Min assist;HOB elevated     General bed mobility comments: light assist for LLE lifting and translation to EOB, pt able to scoot self to EOB with increased time and use of bedrail.  Transfers Overall transfer level: Needs assistance Equipment used: Rolling walker (2 wheeled) Transfers: Sit to/from Stand Sit to Stand: Min guard;From elevated surface         General transfer comment: Min guard for safety, verbal cuing for hand placement when rising.  Ambulation/Gait Ambulation/Gait assistance: Min guard Gait Distance (Feet): 20  Feet Assistive device: Rolling walker (2 wheeled) Gait Pattern/deviations: Step-through pattern;Step-to pattern;Decreased stride length;Trunk flexed;Antalgic;Decreased weight shift to left Gait velocity: decr   General Gait Details: Min guard for safety, antalgic gait especially last 5 ft ambulation. VC for placement in RW. Pt with DOE 3/4 with ambulation, pt took self-directed standing rest break to recover.  Stairs            Wheelchair Mobility    Modified Rankin (Stroke Patients Only)       Balance Overall balance assessment: Mild deficits observed, not formally tested(pt reports fall 2 years ago)                                           Pertinent Vitals/Pain Pain Assessment: 0-10 Pain Score: 7  Pain Location: L hip Pain Descriptors / Indicators: Sore;Aching Pain Intervention(s): Limited activity within patient's tolerance;Monitored during session;Repositioned    Home Living Family/patient expects to be discharged to:: Private residence Living Arrangements: Spouse/significant other Available Help at Discharge: Family;Available PRN/intermittently Type of Home: House Home Access: Stairs to enter Entrance Stairs-Rails: Left;Right;Can reach both Entrance Stairs-Number of Steps: 3-4 Home Layout: Able to live on main level with bedroom/bathroom;1/2 bath on main level;Two level Home Equipment: Walker - 2 wheels;Shower seat;Hand held shower head;Cane - single point      Prior Function Level of Independence: Independent with assistive device(s)         Comments: didn't use cane/RW very much. husband was helping her "roll around in a rolling chair" to where she needed to go     Hand Dominance  Dominant Hand: Right    Extremity/Trunk Assessment   Upper Extremity Assessment Upper Extremity Assessment: Overall WFL for tasks assessed    Lower Extremity Assessment Lower Extremity Assessment: Generalized weakness;LLE deficits/detail LLE  Deficits / Details: suspected post-surgical weakness; able to perform ankle pumps, quad set, heel slide, SLR with lift assist during bed mobility LLE Coordination: WNL    Cervical / Trunk Assessment Cervical / Trunk Assessment: Normal  Communication   Communication: No difficulties  Cognition Arousal/Alertness: Awake/alert Behavior During Therapy: WFL for tasks assessed/performed Overall Cognitive Status: Within Functional Limits for tasks assessed                                        General Comments      Exercises     Assessment/Plan    PT Assessment Patient needs continued PT services  PT Problem List Decreased strength;Decreased mobility;Decreased safety awareness;Decreased range of motion;Decreased activity tolerance;Decreased balance;Decreased knowledge of use of DME;Pain       PT Treatment Interventions DME instruction;Therapeutic activities;Gait training;Therapeutic exercise;Balance training;Stair training;Functional mobility training    PT Goals (Current goals can be found in the Care Plan section)  Acute Rehab PT Goals Patient Stated Goal: go home PT Goal Formulation: With patient Time For Goal Achievement: 02/25/19 Potential to Achieve Goals: Good    Frequency 7X/week   Barriers to discharge        Co-evaluation               AM-PAC PT "6 Clicks" Mobility  Outcome Measure Help needed turning from your back to your side while in a flat bed without using bedrails?: A Little Help needed moving from lying on your back to sitting on the side of a flat bed without using bedrails?: A Little Help needed moving to and from a bed to a chair (including a wheelchair)?: A Little Help needed standing up from a chair using your arms (e.g., wheelchair or bedside chair)?: A Little Help needed to walk in hospital room?: A Little Help needed climbing 3-5 steps with a railing? : A Lot 6 Click Score: 17    End of Session Equipment Utilized During  Treatment: Gait belt;Oxygen Activity Tolerance: Patient limited by pain;Patient limited by fatigue Patient left: in chair;with chair alarm set;with call bell/phone within reach Nurse Communication: Mobility status;Patient requests pain meds PT Visit Diagnosis: Unsteadiness on feet (R26.81);Other abnormalities of gait and mobility (R26.89);Difficulty in walking, not elsewhere classified (R26.2)    Time: [id number] PT Time Calculation (min) (ACUTE ONLY): 29 min   Charges:   PT Evaluation $PT Eval Low Complexity: 1 Low PT Treatments $Gait Training: 8-22 mins       Nicola PoliceAlexa D Vraj Denardo, PT Acute Rehabilitation Services Pager [phone]  Office [phone]  Severo Beber D Despina Hiddenure 02/18/2019, 6:30 PM

## 2019-02-19 ENCOUNTER — Encounter (HOSPITAL_COMMUNITY): Payer: Self-pay | Admitting: Orthopedic Surgery

## 2019-02-19 LAB — BASIC METABOLIC PANEL
Anion gap: 7 (ref 5–15)
BUN: 10 mg/dL (ref 6–20)
CO2: 26 mmol/L (ref 22–32)
Calcium: 7.9 mg/dL — ABNORMAL LOW (ref 8.9–10.3)
Chloride: 104 mmol/L (ref 98–111)
Creatinine, Ser: 0.56 mg/dL (ref 0.44–1.00)
GFR calc Af Amer: >60 mL/min (ref >60)
GFR calc non Af Amer: >60 mL/min (ref >60)
Glucose, Bld: 117 mg/dL — ABNORMAL HIGH (ref 70–99)
Potassium: 4.3 mmol/L (ref 3.5–5.1)
Sodium: 137 mmol/L (ref 135–145)

## 2019-02-19 LAB — CBC
HCT: 32 % — ABNORMAL LOW (ref 36.0–46.0)
Hemoglobin: 9.8 g/dL — ABNORMAL LOW (ref 12.0–15.0)
MCH: 28.7 pg (ref 26.0–34.0)
MCHC: 30.6 g/dL (ref 30.0–36.0)
MCV: 93.6 fL (ref 80.0–100.0)
Platelets: 233 10*3/uL (ref 150–400)
RBC: 3.42 MIL/uL — ABNORMAL LOW (ref 3.87–5.11)
RDW: 14.6 % (ref 11.5–15.5)
WBC: 23.7 10*3/uL — ABNORMAL HIGH (ref 4.0–10.5)
nRBC: 0 % (ref 0.0–0.2)

## 2019-02-19 MED ORDER — ASPIRIN 81 MG PO CHEW: 81.0000 mg | CHEWABLE_TABLET | Freq: Two times a day (BID) | ORAL | 1 refills | Status: DC

## 2019-02-19 MED ORDER — DOCUSATE SODIUM 100 MG PO CAPS: 100.0000 mg | ORAL_CAPSULE | Freq: Two times a day (BID) | ORAL | 1 refills | Status: DC

## 2019-02-19 MED ORDER — ONDANSETRON HCL 4 MG PO TABS: 4.0000 mg | ORAL_TABLET | Freq: Four times a day (QID) | ORAL | 0 refills | Status: DC | PRN

## 2019-02-19 MED ORDER — OXYCODONE HCL 10 MG PO TABS: 10.0000 mg | ORAL_TABLET | ORAL | 0 refills | Status: DC | PRN

## 2019-02-19 MED ORDER — MAGIC MOUTHWASH
15.0000 mL | Freq: Three times a day (TID) | ORAL | Status: DC | PRN
  Administered 2019-02-19 – 2019-02-20 (×2): 15 mL via ORAL
  Filled 2019-02-19 (×3): qty 15

## 2019-02-19 MED ORDER — NICOTINE 14 MG/24HR TD PT24
14.0000 mg | MEDICATED_PATCH | Freq: Every day | TRANSDERMAL | Status: DC
  Administered 2019-02-20 (×2): 14 mg via TRANSDERMAL
  Filled 2019-02-19 (×2): qty 1

## 2019-02-19 MED ORDER — SENNA 8.6 MG PO TABS: 1.0000 | ORAL_TABLET | Freq: Two times a day (BID) | ORAL | 0 refills | Status: DC

## 2019-02-19 MED ORDER — PREDNISONE 5 MG PO TABS
10.0000 mg | ORAL_TABLET | Freq: Every day | ORAL | Status: DC
  Administered 2019-02-19 – 2019-02-20 (×2): 10 mg via ORAL
  Filled 2019-02-19 (×2): qty 2

## 2019-02-19 NOTE — Progress Notes (Signed)
Physical Therapy Treatment Patient Details Name: [name] MRN: [id number] DOB: 09/06/1966 Today's Date: 02/19/2019    History of Present Illness 53 yo female s/p L DA-THA on 02/18/19. 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    PT Comments    POD # 1 pm session Pt feeling better.  "Ready to get back to bed".  Assisted with amb a functional distance in hallway.  Remained on 4 lts nasal.  Assisted to [facility] to void then back to bed and positioned R sidelying.   Pt has NOT met goals to D/C to home today.     Follow Up Recommendations  Follow surgeon's recommendation for DC plan and follow-up therapies;Supervision for mobility/OOB     Equipment Recommendations  None recommended by PT    Recommendations for Other Services       Precautions / Restrictions Precautions Precautions: Fall Precaution Comments: COPD Home oxygen 4 lts Restrictions Weight Bearing Restrictions: No Other Position/Activity Restrictions: WBAT    Mobility  Bed Mobility Overal bed mobility: Needs Assistance Bed Mobility: Sit to Supine     Supine to sit: Mod assist     General bed mobility comments: assisted back to bed Mod Assist B LE then assisted sidelying at Supervision.  Pt is more comfortable on her side Hx back  Transfers Overall transfer level: Needs assistance Equipment used: Rolling walker (2 wheeled) Transfers: Sit to/from Stand Sit to Stand: Min assist         General transfer comment: assisted with amb then on/odd BSC at [place] with good safety cognition and use of hands to steady self.  Ambulation/Gait   Gait Distance (Feet): 55 Feet         General Gait Details: pt tolerated amb a functional distance 55 feet at Supervision Assist on 4 lts avg sats 92% and HR 87.   Stairs             Wheelchair Mobility    Modified Rankin (Stroke Patients Only)       Balance                                             Cognition Arousal/Alertness: Awake/alert Behavior During Therapy: WFL for tasks assessed/performed Overall Cognitive Status: Within Functional Limits for tasks assessed                                        Exercises      General Comments        Pertinent Vitals/Pain Pain Assessment: 0-10 Pain Score: 5  Pain Location: L hip Pain Descriptors / Indicators: Sore;Aching Pain Intervention(s): Monitored during session;Repositioned    Home Living                      Prior Function            PT Goals (current goals can now be found in the care plan section) Progress towards PT goals: Progressing toward goals    Frequency    7X/week      PT Plan Current plan remains appropriate    Co-evaluation              AM-PAC PT "6 Clicks" Mobility   Outcome Measure  Help needed turning from your back to your side while in a flat bed without using bedrails?: A Little Help needed moving from lying on your back to sitting on the side of a flat bed without using bedrails?: A Little Help needed moving to and from a bed to a chair (including a wheelchair)?: A Little Help needed standing up from a chair using your arms (e.g., wheelchair or bedside chair)?: A Little Help needed to walk in hospital room?: A Little Help needed climbing 3-5 steps with a railing? : A Little 6 Click Score: 18    End of Session Equipment Utilized During Treatment: Gait belt Activity Tolerance: Other (comment)(limited by Respiratory distress) Patient left: in chair;with chair alarm set;with call bell/phone within reach Nurse Communication: Mobility status;Patient requests pain meds PT Visit Diagnosis: Unsteadiness on feet (R26.81);Other abnormalities of gait and mobility (R26.89);Difficulty in walking, not elsewhere classified (R26.2)     Time: 9747-1855 PT Time Calculation (min) (ACUTE ONLY): 23 min  Charges:  $Gait  Training: 8-22 mins $Therapeutic Activity: 8-22 mins                     Rica Koyanagi  PTA Acute  Rehabilitation Services Pager      [phone] Office      [phone]

## 2019-02-19 NOTE — Progress Notes (Signed)
    Subjective:  Patient reports pain as moderate to severe.  Denies N/V/CP/SOB. Had SOB earlier when she got up but now improved with nebulizer and cough suppressant.  Objective:   VITALS:   Vitals:   02/19/19 0512 02/19/19 0709 02/19/19 1002 02/19/19 1107  BP: 104/67  118/80   Pulse: 89 78 94 (!) 123  Resp: 18 18 13  (!) 24  Temp: 98.7 F (37.1 C)  98.2 F (36.8 C)   TempSrc: Oral  Oral   SpO2: 95% 90% 95% 91%  Weight:      Height:        NAD ABD soft Sensation intact distally Intact pulses distally Dorsiflexion/Plantar flexion intact Incision: dressing C/D/I Compartment soft   Lab Results  Component Value Date   WBC 23.7 (H) 02/19/2019   HGB 9.8 (L) 02/19/2019   HCT 32.0 (L) 02/19/2019   MCV 93.6 02/19/2019   PLT 233 02/19/2019   BMET    Component Value Date/Time   NA 137 02/19/2019 0439   K 4.3 02/19/2019 0439   CL 104 02/19/2019 0439   CO2 26 02/19/2019 0439   GLUCOSE 117 (H) 02/19/2019 0439   BUN 10 02/19/2019 0439   CREATININE 0.56 02/19/2019 0439   CALCIUM 7.9 (L) 02/19/2019 0439   GFRNONAA >60 02/19/2019 0439   GFRAA >60 02/19/2019 0439     Assessment/Plan: 1 Day Post-Op   Principal Problem:   Osteoarthritis of left hip   WBAT with walker DVT ppx: Aspirin, SCDs, TEDS PO pain control PT/OT COPD on 4L home O2: cont care, monitor, complete stress dose steroids then resume prednisone Dispo: D/C home tomorrow with HEP    [name] 02/19/2019, 12:55 PM   Rod Can, MD Cell: [phone] [place] [facility]  [place] [address] [place], [place], [place] [address] Phone: [phone] [email or web address] Facebook  [employer]

## 2019-02-19 NOTE — Discharge Summary (Signed)
Physician Discharge Summary  Patient ID: [name] MRN: [id number] DOB/AGE: 04-20-1966 53 y.o.  Admit date: 02/18/2019 Discharge date: 02/20/2019  Admission Diagnoses:  Osteoarthritis of left hip  Discharge Diagnoses:  Principal Problem:   Osteoarthritis of left hip   Past Medical History:  Diagnosis Date  . Allergic rhinitis   . Asthma   . COPD (chronic obstructive pulmonary disease) ([place])    on home O2  . GERD (gastroesophageal reflux disease)   . Hypertension   . Shortness of breath   . Sleep apnea    NO DEVICE IN USE, USES SUPPLEMENTAL O2 IN PLACE OF DEVICE   . Supplemental oxygen dependent    4L CONTINUOUS     Surgeries: Procedure(s): TOTAL HIP ARTHROPLASTY ANTERIOR APPROACH on 02/18/2019   Consultants (if any):   Discharged Condition: Improved  Hospital Course: [name] is an 53 y.o. female who was admitted 02/18/2019 with a diagnosis of Osteoarthritis of left hip and went to the operating room on 02/18/2019 and underwent the above named procedures.    She was given perioperative antibiotics:  Anti-infectives (From admission, onward)   Start     Dose/Rate Route Frequency Ordered Stop   02/19/19 1000  azithromycin (ZITHROMAX) tablet 250 mg  Status:  Discontinued     250 mg Oral Daily 02/18/19 1303 02/20/19 1528   02/18/19 0600  ceFAZolin (ANCEF) IVPB 2g/100 mL premix     2 g 200 mL/hr over 30 Minutes Intravenous On call to O.R. 02/18/19 4098 02/18/19 0753    .  She was given sequential compression devices, early ambulation, and ASA for DVT prophylaxis.  She benefited maximally from the hospital stay and there were no complications.    Recent vital signs:  Vitals:   02/20/19 0744 02/20/19 0928  BP:  126/70  Pulse:  100  Resp:  18  Temp:  98.6 F (37 C)  SpO2: 95% 92%    Recent laboratory studies:  Lab Results  Component Value Date   HGB 10.3 (L) 02/20/2019   HGB 9.8 (L) 02/19/2019   HGB 15.0 02/15/2019   Lab Results  Component Value  Date   WBC 17.4 (H) 02/20/2019   PLT 249 02/20/2019   Lab Results  Component Value Date   INR 0.9 02/15/2019   Lab Results  Component Value Date   NA 137 02/19/2019   K 4.3 02/19/2019   CL 104 02/19/2019   CO2 26 02/19/2019   BUN 10 02/19/2019   CREATININE 0.56 02/19/2019   GLUCOSE 117 (H) 02/19/2019    Discharge Medications:   Allergies as of 02/20/2019      Reactions   Amoxicillin-pot Clavulanate Itching   Ok with benadryl Did it involve swelling of the face/tongue/throat, SOB, or low BP? No Did it involve sudden or severe rash/hives, skin peeling, or any reaction on the inside of your mouth or nose? No Did you need to seek medical attention at a hospital or doctor's office? No When did it last happen?6 months If all above answers are "NO", may proceed with cephalosporin use.   Erythromycin Rash   Ok with benadryl    Moxifloxacin Itching   Reaction to Avelox - ok with benadryl   Sulfamethoxazole-trimethoprim Itching   Ok with benadryl      Medication List    STOP taking these medications   HYDROcodone-acetaminophen 10-325 MG tablet Commonly known as: NORCO     TAKE these medications   albuterol (2.5 MG/3ML) 0.083% nebulizer solution Commonly known  as: PROVENTIL Take 3 mLs (2.5 mg total) by nebulization every 4 (four) hours as needed for wheezing or shortness of breath.   ALPRAZolam 0.5 MG tablet Commonly known as: XANAX Take 0.25-0.5 mg by mouth daily as needed for anxiety (shortness of breath).   aspirin 81 MG chewable tablet Chew 1 tablet (81 mg total) by mouth 2 (two) times daily.   azithromycin 250 MG tablet Commonly known as: ZITHROMAX Take 250 mg by mouth daily.   budesonide-formoterol 160-4.5 MCG/ACT inhaler Commonly known as: SYMBICORT Inhale 2 puffs 2 (two) times daily into the lungs.   cetirizine 10 MG tablet Commonly known as: ZYRTEC Take 10 mg daily by mouth.   docusate sodium 100 MG capsule Commonly known as: COLACE Take 1  capsule (100 mg total) by mouth 2 (two) times daily.   esomeprazole 40 MG capsule Commonly known as: NEXIUM Take 40 mg by mouth daily at 12 noon.   FLUoxetine 40 MG capsule Commonly known as: PROZAC Take 40 mg by mouth daily.   fluticasone 50 MCG/ACT nasal spray Commonly known as: FLONASE Place 1 spray into both nostrils daily as needed for allergies.   gabapentin 400 MG capsule Commonly known as: NEURONTIN Take 400 mg by mouth 3 (three) times daily.   guaiFENesin-dextromethorphan 100-10 MG/5ML syrup Commonly known as: ROBITUSSIN DM Take 20 mLs by mouth every 4 (four) hours as needed for cough.   loperamide 2 MG tablet Commonly known as: IMODIUM A-D Take 4 mg by mouth as needed for diarrhea or loose stools.   losartan 100 MG tablet Commonly known as: COZAAR Take 100 mg daily by mouth.   magic mouthwash Soln Take 15 mLs by mouth 3 (three) times daily as needed for mouth pain.   montelukast 10 MG tablet Commonly known as: SINGULAIR Take 10 mg at bedtime by mouth.   morphine 15 MG 12 hr tablet Commonly known as: MS CONTIN Take 15 mg by mouth 3 (three) times daily.   naproxen sodium 220 MG tablet Commonly known as: ALEVE Take 440 mg by mouth daily as needed (pain).   nystatin 100000 UNIT/ML suspension Commonly known as: MYCOSTATIN Take 5 mLs by mouth daily as needed (thrush).   nystatin powder Generic drug: nystatin Apply 1 g topically daily as needed (irritation).   ondansetron 4 MG tablet Commonly known as: ZOFRAN Take 1 tablet (4 mg total) by mouth every 6 (six) hours as needed for nausea.   Oxycodone HCl 10 MG Tabs Take 1 tablet (10 mg total) by mouth every 4 (four) hours as needed for severe pain (pain score 7-10).   OXYGEN Inhale 3-4 L continuous into the lungs.   predniSONE 10 MG tablet Commonly known as: DELTASONE Take 6 pills (60mg )  at 10 am on 9/15 Take 5 pills (50mg ) at 10 am on 9/16 Take 4 pills (40mg ) at 10 am on 9/17 Take 3 pills (30mg )  at 10 am on 9/18 Take 2 pills (20mg ) at 10 am on 9/19 Take 1 pills (10mg ) at 10 am on 9/20 Then stop What changed:   how much to take  how to take this  when to take this  additional instructions   REDNESS RELIEVER EYE DROPS OP Place 1 drop into both eyes daily as needed (redness).   senna 8.6 MG Tabs tablet Commonly known as: SENOKOT Take 1 tablet (8.6 mg total) by mouth 2 (two) times daily.   Spiriva Respimat 2.5 MCG/ACT Aers Generic drug: Tiotropium Bromide Monohydrate Inhale 2 puffs into the  lungs daily.       Diagnostic Studies: Dg Pelvis Portable  Result Date: 02/18/2019 CLINICAL DATA:  Postop left hip arthroplasty EXAM: PORTABLE PELVIS 1-2 VIEWS COMPARISON:  None. FINDINGS: Interval left total hip arthroplasty without hardware failure or complication. Postsurgical changes in the surrounding soft tissues. Normal alignment. No acute fracture or dislocation. IMPRESSION: Interval left total hip arthroplasty without hardware failure or complication. Electronically Signed   By: Elige KoHetal  Patel   On: 02/18/2019 11:15   Dg C-arm 1-60 Min  Result Date: 02/18/2019 CLINICAL DATA:  Total left hip replacement EXAM: OPERATIVE LEFT HIP (WITH PELVIS IF PERFORMED) 2 VIEWS TECHNIQUE: Fluoroscopic spot image(s) were submitted for interpretation post-operatively. COMPARISON:  MRI 09/29/2018 FINDINGS: Changes of left hip replacement. No visible hardware or bony complicating feature. Normal AP alignment. IMPRESSION: Left hip replacement.  No visible complicating feature. Electronically Signed   By: Charlett NoseKevin  Dover M.D.   On: 02/18/2019 09:55   Dg Hip Operative Unilat W Or W/o Pelvis Left  Result Date: 02/18/2019 CLINICAL DATA:  Total left hip replacement EXAM: OPERATIVE LEFT HIP (WITH PELVIS IF PERFORMED) 2 VIEWS TECHNIQUE: Fluoroscopic spot image(s) were submitted for interpretation post-operatively. COMPARISON:  MRI 09/29/2018 FINDINGS: Changes of left hip replacement. No visible hardware or bony  complicating feature. Normal AP alignment. IMPRESSION: Left hip replacement.  No visible complicating feature. Electronically Signed   By: Charlett NoseKevin  Dover M.D.   On: 02/18/2019 09:55    Disposition:     Follow-up Information    Dontaye Hur, Arlys JohnBrian, MD. Schedule an appointment as soon as possible for a visit in 2 weeks.   Specialty: Orthopedic Surgery Why: For wound re-check, For suture removal Contact information: [address] [place] [address] [phone]            Signed: Iline OvenBrian J Machi Whittaker 02/23/2019, 1:51 PM

## 2019-02-19 NOTE — Plan of Care (Signed)
  Problem: Clinical Measurements: Goal: Will remain free from infection Outcome: Progressing Goal: Diagnostic test results will improve Outcome: Progressing Goal: Respiratory complications will improve Outcome: Progressing Goal: Cardiovascular complication will be avoided Outcome: Progressing   

## 2019-02-19 NOTE — Progress Notes (Signed)
RT called to administer a PRN breathing treatment. Upon arrival, patient has increased WOB, shortness of breath, increased HR, and strong, congested cough present; BBS I/E wheezing. Per PT, patient had just gotten on the bedside commode and became SOB with exertion. PRN breathing treatment administered at this time. WOB improved, wheezing improved, and mild improvement in SOB per patient.  RT will continue to monitor patient.

## 2019-02-19 NOTE — Progress Notes (Signed)
Physical Therapy Treatment Patient Details Name: [name] MRN: [id number] DOB: May 31, 1966 Today's Date: 02/19/2019    History of Present Illness 53 yo female s/p L DA-THA on 02/18/19. 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    PT Comments    POD # 1 am session. Session limited due to episode of Respiratory Distress.  Assisted from elevated bed to [facility] with much effort due to dyspnea.  Pt remained on 4 lts nasal started at 96% but decreased to mid 80"s wirh noted 4/4 dyspnea and elevated HR 138.  RN in room at time.  Despite rest period on BSC, WOB continued.  RN called RT for a breathing Tx.  Assisted to recliner.  Unable to tolerate further activity.  Will see again this afternoon.   Follow Up Recommendations  Follow surgeon's recommendation for DC plan and follow-up therapies;Supervision for mobility/OOB     Equipment Recommendations  None recommended by PT    Recommendations for Other Services       Precautions / Restrictions Precautions Precautions: Fall Precaution Comments: COPD Home oxygen 4 lts Restrictions Weight Bearing Restrictions: No Other Position/Activity Restrictions: WBAT    Mobility  Bed Mobility Overal bed mobility: Needs Assistance Bed Mobility: Supine to Sit     Supine to sit: Min assist;HOB elevated     General bed mobility comments: light assist for LLE lifting and translation to EOB, pt able to scoot self to EOB with increased time and use of bedrail.  Transfers Overall transfer level: Needs assistance Equipment used: Rolling walker (2 wheeled) Transfers: Sit to/from Stand Sit to Stand: Min assist         General transfer comment: assisted from elevated bed to [facility] then to recliner  Ambulation/Gait             General Gait Details: unable to amb this session due to respiratory destress/WOC with transfer.  RN in room and RT called for a breathing  Tx.   Stairs             Wheelchair Mobility    Modified Rankin (Stroke Patients Only)       Balance                                            Cognition Arousal/Alertness: Awake/alert Behavior During Therapy: WFL for tasks assessed/performed Overall Cognitive Status: Within Functional Limits for tasks assessed                                        Exercises      General Comments        Pertinent Vitals/Pain Pain Assessment: 0-10 Pain Score: 5  Pain Location: L hip Pain Descriptors / Indicators: Sore;Aching Pain Intervention(s): Monitored during session;Repositioned    Home Living                      Prior Function            PT Goals (current goals can now be found in the care plan section) Progress towards PT goals: Progressing toward goals    Frequency    7X/week      PT Plan Current plan remains appropriate    Co-evaluation  AM-PAC PT "6 Clicks" Mobility   Outcome Measure  Help needed turning from your back to your side while in a flat bed without using bedrails?: A Little Help needed moving from lying on your back to sitting on the side of a flat bed without using bedrails?: A Little Help needed moving to and from a bed to a chair (including a wheelchair)?: A Little Help needed standing up from a chair using your arms (e.g., wheelchair or bedside chair)?: A Little Help needed to walk in hospital room?: A Little Help needed climbing 3-5 steps with a railing? : A Little 6 Click Score: 18    End of Session Equipment Utilized During Treatment: Gait belt Activity Tolerance: Other (comment)(limited by Respiratory distress) Patient left: in chair;with chair alarm set;with call bell/phone within reach Nurse Communication: Mobility status;Patient requests pain meds PT Visit Diagnosis: Unsteadiness on feet (R26.81);Other abnormalities of gait and mobility (R26.89);Difficulty in  walking, not elsewhere classified (R26.2)     Time: [id number] PT Time Calculation (min) (ACUTE ONLY): 33 min  Charges:  $Therapeutic Activity: 23-37 mins                     Felecia ShellingLori Tyrell Seifer  PTA Acute  Rehabilitation Services Pager      [phone] Office      [phone]

## 2019-02-20 LAB — CBC
HCT: 34.3 % — ABNORMAL LOW (ref 36.0–46.0)
Hemoglobin: 10.3 g/dL — ABNORMAL LOW (ref 12.0–15.0)
MCH: 28.3 pg (ref 26.0–34.0)
MCHC: 30 g/dL (ref 30.0–36.0)
MCV: 94.2 fL (ref 80.0–100.0)
Platelets: 249 10*3/uL (ref 150–400)
RBC: 3.64 MIL/uL — ABNORMAL LOW (ref 3.87–5.11)
RDW: 14.6 % (ref 11.5–15.5)
WBC: 17.4 10*3/uL — ABNORMAL HIGH (ref 4.0–10.5)
nRBC: 0 % (ref 0.0–0.2)

## 2019-02-20 MED ORDER — MAGIC MOUTHWASH: 15.0000 mL | Freq: Three times a day (TID) | ORAL | 0 refills | Status: DC | PRN

## 2019-02-20 NOTE — Progress Notes (Signed)
[name]  MRN: [id number] DOB/Age: 54/05/1966 86 y.o. [facility] Procedure: Procedure(s) (LRB): TOTAL HIP ARTHROPLASTY ANTERIOR APPROACH (Left)     Subjective: Doing well this am, anxious to go home. Voiding without difficulty and pain controlled, had throat irritation and thrush and I ordered magic mouthwash last night which has helped  Vital Signs Temp:  [98 F (36.7 C)-98.2 F (36.8 C)] 98.2 F (36.8 C) (06/13 0501) Pulse Rate:  [91-123] 95 (06/13 0501) Resp:  [13-24] 18 (06/13 0501) BP: (109-126)/(72-86) 126/78 (06/13 0501) SpO2:  [91 %-99 %] 95 % (06/13 0744)  Lab Results Recent Labs    02/19/19 0439 02/20/19 0244  WBC 23.7* 17.4*  HGB 9.8* 10.3*  HCT 32.0* 34.3*  PLT 233 249   BMET Recent Labs    02/19/19 0439  NA 137  K 4.3  CL 104  CO2 26  GLUCOSE 117*  BUN 10  CREATININE 0.56  CALCIUM 7.9*   INR  Date Value Ref Range Status  02/15/2019 0.9 0.8 - 1.2 Final    Comment:    (NOTE) INR goal varies based on device and disease states. Performed at [facility], [place] [address], [place], [place] [address]      Exam Aquacel in place with scant old bleeding to left hip NVI        Plan rx for magic mouthwash sent in Will DC home to FU as outpatient  Jenetta Loges PA-C  02/20/2019, 9:26 AM Contact # [phone]

## 2019-02-20 NOTE — Progress Notes (Signed)
Discharge paperwork discussed with pt at the bedside.  She demonstrated understanding.  Pt was escorted by wheelchair in stable condition to main lobby.  

## 2019-02-20 NOTE — TOC Initial Note (Signed)
Transition of Care [facility]) - Initial/Assessment Note    Patient Details  Name: [name] MRN: [id number] Date of Birth: 04-May-1966  Transition of [facility]) CM/SW Contact:    Joaquin Courts, RN Phone Number: 02/20/2019, 11:20 AM  Clinical Narrative:                 CM spoke with patient at bedside. Patient to discharge home with home exercise program. [employer] to deliver 3-in-1 to the bedside for home use.   Expected Discharge Plan: Home/Self Care Barriers to Discharge: No Barriers Identified   Patient Goals and CMS Choice Patient states their goals for this hospitalization and ongoing recovery are:: to get better      Expected Discharge Plan and Services Expected Discharge Plan: Home/Self Care   Discharge Planning Services: CM Consult   Living arrangements for the past 2 months: Single Family Home Expected Discharge Date: 02/20/19               DME Arranged: 3-N-1 DME Agency: [employer] Date DME Agency Contacted: 02/20/19 Time DME Agency Contacted: 1119 Representative spoke with at DME Agency: Welcome: NA [place] Agency: NA        Prior Living Arrangements/Services Living arrangements for the past 2 months: [place] with:: Spouse Patient language and need for interpreter reviewed:: Yes Do you feel safe going back to the place where you live?: Yes      Need for Family Participation in Patient Care: Yes (Comment) Care giver support system in place?: Yes (comment)   Criminal Activity/Legal Involvement Pertinent to Current Situation/Hospitalization: No - Comment as needed  Activities of Daily Living Home Assistive Devices/Equipment: Walker (specify type), Oxygen, Eyeglasses, Cane (specify quad or straight) ADL Screening (condition at time of admission) Patient's cognitive ability adequate to safely complete daily activities?: Yes Is the patient deaf or have difficulty hearing?: No Does the patient have difficulty seeing, even when  wearing glasses/contacts?: No Does the patient have difficulty concentrating, remembering, or making decisions?: No Patient able to express need for assistance with ADLs?: Yes Does the patient have difficulty dressing or bathing?: No Independently performs ADLs?: Yes (appropriate for developmental age) Does the patient have difficulty walking or climbing stairs?: Yes Weakness of Legs: Left Weakness of Arms/Hands: None  Permission Sought/Granted Permission sought to share information with : Family Supports Permission granted to share information with : Yes, Verbal Permission Granted  Share Information with NAME: Simona Huh     Permission granted to share info w Relationship: Spouse     Emotional Assessment Appearance:: Appears stated age Attitude/Demeanor/Rapport: Engaged Affect (typically observed): Accepting Orientation: : Oriented to Self, Oriented to Place, Oriented to  Time, Oriented to Situation   Psych Involvement: No (comment)  Admission diagnosis:  Degenerative joint disease left hip Patient Active Problem List   Diagnosis Date Noted  . Osteoarthritis of left hip 02/18/2019  . Chronic respiratory failure with hypoxia ([place]) 05/20/2018  . Chronic pain 05/20/2018  . Leukocytosis 05/20/2018  . Tobacco abuse 05/20/2018  . COPD with acute exacerbation ([place]) 05/20/2018  . Shortness of breath   . Essential hypertension   . GERD (gastroesophageal reflux disease)   . COPD (chronic obstructive pulmonary disease) ([place])   . Asthma   . Closed fracture of left proximal humerus   . Obesity due to excess calories   . Surgery, elective   . Back pain   . Fall   . Near syncope   . Acute pain of left shoulder   .  Closed displaced oblique fracture of shaft of left humerus 07/20/2017  . Lumbar spinal stenosis 04/03/2012    Class: Diagnosis of  . COPD exacerbation (HCC) 05/31/2010  . ALLERGIC RHINITIS 05/09/2010  . DYSPNEA ON EXERTION 05/09/2010   PCP:  [name], Michael C,  MD Pharmacy:   [facility] [phone] - [place], [place] - [address] AT [facility] [address] [place] [address] Phone: [phone] Fax: [phone]  [facility] [place] - [place] [place] [address] Phone: [phone] Fax: [phone]     Social Determinants of Health (SDOH) Interventions    Readmission Risk Interventions No flowsheet data found.

## 2019-02-20 NOTE — Progress Notes (Signed)
Physical Therapy Treatment Patient Details Name: [name] MRN: [id number] DOB: August 21, 1966 Today's Date: 02/20/2019    History of Present Illness 53 yo female s/p L DA-THA on 02/18/19. 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    PT Comments    Pt able to negotiate 1 step with RW with fatigue while on 4 L/min. o2 89% HR 125 after gait and stairs.  She needed MIN A for bed mobility with HOB elevated, but she sleeps in a recliner at home.   Follow Up Recommendations  Follow surgeon's recommendation for DC plan and follow-up therapies;Supervision for mobility/OOB     Equipment Recommendations  None recommended by PT    Recommendations for Other Services       Precautions / Restrictions Precautions Precautions: Fall;Other (comment)(o2 dependent) Precaution Comments: COPD Home oxygen 4 lts Restrictions Weight Bearing Restrictions: No Other Position/Activity Restrictions: WBAT    Mobility  Bed Mobility Overal bed mobility: Needs Assistance Bed Mobility: Supine to Sit     Supine to sit: Min assist     General bed mobility comments: MIN A to guide leg. HOB elevated. She sleeps in recliner at home.  Transfers Overall transfer level: Needs assistance Equipment used: Rolling walker (2 wheeled) Transfers: Sit to/from [facility] Sit to Stand: Min guard Stand pivot transfers: Min guard       General transfer comment: SPT bed <> BSC, then stood to RW for gait. Good use of arms  Ambulation/Gait Ambulation/Gait assistance: Min guard Gait Distance (Feet): 40 Feet Assistive device: Rolling walker (2 wheeled) Gait Pattern/deviations: Step-through pattern;Step-to pattern;Decreased stride length;Trunk flexed;Antalgic;Decreased weight shift to left Gait velocity: decr   General Gait Details: Antalgic gait with o2 intact at 4 L/min. o2 89% HR 125 at end of gait.   Stairs Stairs:  Yes Stairs assistance: Min guard Stair Management: Backwards;Forwards;With walker Number of Stairs: 1 General stair comments: Pt realized she could enter the house with 1 step vs the 3 she mentioned at eval.   Wheelchair Mobility    Modified Rankin (Stroke Patients Only)       Balance                                            Cognition Arousal/Alertness: Awake/alert Behavior During Therapy: WFL for tasks assessed/performed Overall Cognitive Status: Within Functional Limits for tasks assessed                                        Exercises      General Comments        Pertinent Vitals/Pain Pain Assessment: 0-10 Pain Score: 5  Pain Location: L hip Pain Descriptors / Indicators: Sore;Aching Pain Intervention(s): Monitored during session;Repositioned    Home Living                      Prior Function            PT Goals (current goals can now be found in the care plan section) Acute Rehab PT Goals PT Goal Formulation: With patient Time For Goal Achievement: 02/25/19 Potential to Achieve Goals: Good Progress towards PT goals: Progressing toward goals    Frequency    7X/week      PT Plan  Current plan remains appropriate    Co-evaluation              AM-PAC PT "6 Clicks" Mobility   Outcome Measure  Help needed turning from your back to your side while in a flat bed without using bedrails?: A Little Help needed moving from lying on your back to sitting on the side of a flat bed without using bedrails?: A Little Help needed moving to and from a bed to a chair (including a wheelchair)?: A Little Help needed standing up from a chair using your arms (e.g., wheelchair or bedside chair)?: A Little Help needed to walk in hospital room?: A Little Help needed climbing 3-5 steps with a railing? : A Little 6 Click Score: 18    End of Session Equipment Utilized During Treatment: Gait belt;Oxygen Activity  Tolerance: Patient limited by pain;Patient limited by fatigue Patient left: in bed;with call bell/phone within reach;with bed alarm set(sitting EOB eating breakfast) Nurse Communication: Mobility status PT Visit Diagnosis: Unsteadiness on feet (R26.81);Other abnormalities of gait and mobility (R26.89);Difficulty in walking, not elsewhere classified (R26.2)     Time: [id number] PT Time Calculation (min) (ACUTE ONLY): 27 min  Charges:  $Gait Training: 8-22 mins $Therapeutic Activity: 8-22 mins                     Alohilani Levenhagen L. Katrinka BlazingSmith, [place] Pager [phone] 02/20/2019    Enzo MontgomeryKaren L Season Astacio 02/20/2019, 10:21 AM

## 2019-02-20 NOTE — Progress Notes (Signed)
Patient states she feels she has thrush. Back of patient's throat is red. Also,  Patient requested a nicotine patch. [place] paged.

## 2019-03-30 ENCOUNTER — Other Ambulatory Visit (HOSPITAL_COMMUNITY)
Admission: RE | Admit: 2019-03-30 | Discharge: 2019-03-30 | Disposition: A | Discharge status: Home / Self Care | Payer: Medicare Other | Source: Ambulatory Visit | Attending: Orthopedic Surgery | Admitting: Orthopedic Surgery

## 2019-03-30 ENCOUNTER — Encounter (HOSPITAL_COMMUNITY): Payer: Self-pay

## 2019-03-30 DIAGNOSIS — Z1159 Encounter for screening for other viral diseases: Secondary | ICD-10-CM | POA: Diagnosis present

## 2019-03-30 LAB — SARS CORONAVIRUS 2 (TAT 6-24 HRS): SARS Coronavirus 2: NEGATIVE

## 2019-03-30 NOTE — Patient Instructions (Addendum)
DUE TO COVID-19 ONLY ONE VISITOR IS ALLOWED IN THE HOSPITAL AT THIS TIME   COVID SWAB TESTING COMPLETED ON:  March 30, 2019  (Must self quarantine after testing. Follow instructions on handout.)   Your procedure is scheduled on: April 01, 2019   [facility] Main  Entrance    Report to admitting at 3:10 PM   Call this number if you have problems the morning of surgery [phone]  BRING CPAP MASK AND TUBING   NO SOLID FOOD AFTER MIDNIGHT THE NIGHT PRIOR TO SURGERY. NOTHING BY MOUTH EXCEPT CLEAR LIQUIDS UNTIL 210 pm.  PLEASE FINISH ENSURE DRINK PER SURGEON ORDER 3 HOURS PRIOR TO SCHEDULED SURGERY TIME WHICH NEEDS TO BE COMPLETED AT 210 pm.   CLEAR LIQUID DIET   Foods Allowed                                                                     Foods Excluded  Coffee and tea, regular and decaf                             liquids that you cannot  Plain Jell-O any favor except red or purple                                           see through such as: Fruit ices (not with fruit pulp)                                     milk, soups, orange juice  Iced Popsicles                                    All solid food Carbonated beverages, regular and diet                                    Cranberry, grape and apple juices Sports drinks like Gatorade Lightly seasoned clear broth or consume(fat free) Sugar, honey syrup  Sample Menu Breakfast                                Lunch                                     Supper Cranberry juice                    Beef broth                            Chicken broth Jell-O  Grape juice                           Apple juice Coffee or tea                        Jell-O                                      Popsicle                                                Coffee or tea                        Coffee or tea  _____________________________________________________________________    Brush your teeth  the morning of surgery.   Do NOT smoke after Midnight   Take these medicines the morning of surgery with A SIP OF WATER: MS CONTIN, HYDROCODONE IF NEEDED, ALPRAZOLAMN IF NEEDED, ALBUTEROL NEUBULIZER, AYMBICORT, GABAPENTIN (NEURONTIN). FLUOXETINE (PROZAC), CERTRIZINE (ZYRTEC), PREDNISONE, AZITHROMYCIN, NEXIUM                               You may not have any metal on your body including hair pins, jewelry, and body piercings             Do not wear make-up, lotions, powders, perfumes/cologne, or deodorant             Do not wear nail polish.  Do not shave  48 hours prior to surgery.               Do not bring valuables to the hospital. [place] IS NOT             RESPONSIBLE   FOR VALUABLES.   Contacts, dentures or bridgework may not be worn into surgery.   Bring small overnight bag day of surgery.   Special Instructions: Bring a copy of your healthcare power of attorney and living will documents         the day of surgery if you haven't scanned them in before.              Please read over the following fact sheets you were given:  [facility] - Preparing for [facility] Before surgery, you can play an important role.  Because skin is not sterile, your skin needs to be as free of germs as possible.  You can reduce the number of germs on your skin by washing with CHG (chlorahexidine gluconate) soap before surgery.  CHG is an antiseptic cleaner which kills germs and bonds with the skin to continue killing germs even after washing. Please DO NOT use if you have an allergy to CHG or antibacterial soaps.  If your skin becomes reddened/irritated stop using the CHG and inform your nurse when you arrive at Short Stay. Do not shave (including legs and underarms) for at least 48 hours prior to the first CHG shower.  You may shave your face/neck.  Please follow these instructions carefully:  1.  Shower with CHG Soap the night before surgery and the  morning of surgery.  2.  If you  choose to wash  your hair, wash your hair first as usual with your normal  shampoo.  3.  After you shampoo, rinse your hair and body thoroughly to remove the shampoo.                             4.  Use CHG as you would any other liquid soap.  You can apply chg directly to the skin and wash.  Gently with a scrungie or clean washcloth.  5.  Apply the CHG Soap to your body ONLY FROM THE NECK DOWN.   Do   not use on face/ open                           Wound or open sores. Avoid contact with eyes, ears mouth and   genitals (private parts).                       Wash face,  Genitals (private parts) with your normal soap.             6.  Wash thoroughly, paying special attention to the area where your    surgery  will be performed.  7.  Thoroughly rinse your body with warm water from the neck down.  8.  DO NOT shower/wash with your normal soap after using and rinsing off the CHG Soap.                9.  Pat yourself dry with a clean towel.            10.  Wear clean pajamas.            11.  Place clean sheets on your bed the night of your first shower and do not  sleep with pets. Day of Surgery : Do not apply any lotions/deodorants the morning of surgery.  Please wear clean clothes to the hospital/surgery center.  FAILURE TO FOLLOW THESE INSTRUCTIONS MAY RESULT IN THE CANCELLATION OF YOUR SURGERY  PATIENT SIGNATURE_________________________________  NURSE SIGNATURE__________________________________  ________________________________________________________________________   [name]  An incentive spirometer is a tool that can help keep your lungs clear and active. This tool measures how well you are filling your lungs with each breath. Taking long deep breaths may help reverse or decrease the chance of developing breathing (pulmonary) problems (especially infection) following:  A long period of time when you are unable to move or be active. BEFORE THE PROCEDURE   If the spirometer includes an  indicator to show your best effort, your nurse or respiratory therapist will set it to a desired goal.  If possible, sit up straight or lean slightly forward. Try not to slouch.  Hold the incentive spirometer in an upright position. INSTRUCTIONS FOR USE  1. Sit on the edge of your bed if possible, or sit up as far as you can in bed or on a chair. 2. Hold the incentive spirometer in an upright position. 3. Breathe out normally. 4. Place the mouthpiece in your mouth and seal your lips tightly around it. 5. Breathe in slowly and as deeply as possible, raising the piston or the ball toward the top of the column. 6. Hold your breath for 3-5 seconds or for as long as possible. Allow the piston or ball to fall to the bottom of the column. 7. Remove the mouthpiece from your mouth and breathe  out normally. 8. Rest for a few seconds and repeat Steps 1 through 7 at least 10 times every 1-2 hours when you are awake. Take your time and take a few normal breaths between deep breaths. 9. The spirometer may include an indicator to show your best effort. Use the indicator as a goal to work toward during each repetition. 10. After each set of 10 deep breaths, practice coughing to be sure your lungs are clear. If you have an incision (the cut made at the time of surgery), support your incision when coughing by placing a pillow or rolled up towels firmly against it. Once you are able to get out of bed, walk around indoors and cough well. You may stop using the incentive spirometer when instructed by your caregiver.  RISKS AND COMPLICATIONS  Take your time so you do not get dizzy or light-headed.  If you are in pain, you may need to take or ask for pain medication before doing incentive spirometry. It is harder to take a deep breath if you are having pain. AFTER USE  Rest and breathe slowly and easily.  It can be helpful to keep track of a log of your progress. Your caregiver can provide you with a simple table  to help with this. If you are using the spirometer at home, follow these instructions: SEEK MEDICAL CARE IF:   You are having difficultly using the spirometer.  You have trouble using the spirometer as often as instructed.  Your pain medication is not giving enough relief while using the spirometer.  You develop fever of 100.5 F (38.1 C) or higher. SEEK IMMEDIATE MEDICAL CARE IF:   You cough up bloody sputum that had not been present before.  You develop fever of 102 F (38.9 C) or greater.  You develop worsening pain at or near the incision site. MAKE SURE YOU:   Understand these instructions.  Will watch your condition.  Will get help right away if you are not doing well or get worse. Document Released: 01/06/2007 Document Revised: 11/18/2011 Document Reviewed: 03/09/2007 [facility] Patient Information 2014 [place].   ________________________________________________________________________

## 2019-03-30 NOTE — Progress Notes (Addendum)
Spoke with Caryl Pina to request orders in epic.  Also sent Dr. Lyla Glassing a message via epic.  Surgical clearance Dr. Melford Aase 01/26/2019 in care everywhere.  The following are in epic: EKG 02/15/2019 CXR 05/19/2018 ECHO 07/21/2017

## 2019-03-31 ENCOUNTER — Other Ambulatory Visit: Payer: Self-pay

## 2019-03-31 ENCOUNTER — Encounter (HOSPITAL_COMMUNITY): Payer: Self-pay

## 2019-03-31 ENCOUNTER — Encounter (HOSPITAL_COMMUNITY)
Admission: RE | Admit: 2019-03-31 | Discharge: 2019-03-31 | Disposition: A | Discharge status: Home / Self Care | Payer: Medicare Other | Source: Ambulatory Visit | Attending: Orthopedic Surgery | Admitting: Orthopedic Surgery

## 2019-03-31 DIAGNOSIS — F17210 Nicotine dependence, cigarettes, uncomplicated: Secondary | ICD-10-CM | POA: Diagnosis not present

## 2019-03-31 DIAGNOSIS — Z7982 Long term (current) use of aspirin: Secondary | ICD-10-CM | POA: Diagnosis not present

## 2019-03-31 DIAGNOSIS — Y838 Other surgical procedures as the cause of abnormal reaction of the patient, or of later complication, without mention of misadventure at the time of the procedure: Secondary | ICD-10-CM | POA: Diagnosis not present

## 2019-03-31 DIAGNOSIS — T8131XD Disruption of external operation (surgical) wound, not elsewhere classified, subsequent encounter: Secondary | ICD-10-CM | POA: Diagnosis present

## 2019-03-31 DIAGNOSIS — Z79891 Long term (current) use of opiate analgesic: Secondary | ICD-10-CM | POA: Diagnosis not present

## 2019-03-31 DIAGNOSIS — Z7952 Long term (current) use of systemic steroids: Secondary | ICD-10-CM | POA: Diagnosis not present

## 2019-03-31 DIAGNOSIS — L7682 Other postprocedural complications of skin and subcutaneous tissue: Secondary | ICD-10-CM | POA: Diagnosis not present

## 2019-03-31 DIAGNOSIS — J449 Chronic obstructive pulmonary disease, unspecified: Secondary | ICD-10-CM | POA: Diagnosis not present

## 2019-03-31 DIAGNOSIS — Z9981 Dependence on supplemental oxygen: Secondary | ICD-10-CM | POA: Diagnosis not present

## 2019-03-31 DIAGNOSIS — I96 Gangrene, not elsewhere classified: Secondary | ICD-10-CM | POA: Diagnosis not present

## 2019-03-31 DIAGNOSIS — Z880 Allergy status to penicillin: Secondary | ICD-10-CM | POA: Diagnosis not present

## 2019-03-31 DIAGNOSIS — Z888 Allergy status to other drugs, medicaments and biological substances status: Secondary | ICD-10-CM | POA: Diagnosis not present

## 2019-03-31 DIAGNOSIS — F419 Anxiety disorder, unspecified: Secondary | ICD-10-CM | POA: Diagnosis not present

## 2019-03-31 DIAGNOSIS — G4730 Sleep apnea, unspecified: Secondary | ICD-10-CM | POA: Diagnosis not present

## 2019-03-31 DIAGNOSIS — Z79899 Other long term (current) drug therapy: Secondary | ICD-10-CM | POA: Diagnosis not present

## 2019-03-31 DIAGNOSIS — Z882 Allergy status to sulfonamides status: Secondary | ICD-10-CM | POA: Diagnosis not present

## 2019-03-31 DIAGNOSIS — Z7951 Long term (current) use of inhaled steroids: Secondary | ICD-10-CM | POA: Diagnosis not present

## 2019-03-31 DIAGNOSIS — Z01812 Encounter for preprocedural laboratory examination: Secondary | ICD-10-CM | POA: Insufficient documentation

## 2019-03-31 DIAGNOSIS — G894 Chronic pain syndrome: Secondary | ICD-10-CM | POA: Diagnosis not present

## 2019-03-31 DIAGNOSIS — K219 Gastro-esophageal reflux disease without esophagitis: Secondary | ICD-10-CM | POA: Diagnosis not present

## 2019-03-31 DIAGNOSIS — I10 Essential (primary) hypertension: Secondary | ICD-10-CM | POA: Diagnosis not present

## 2019-03-31 HISTORY — DX: Unspecified open wound, left hip, initial encounter: S71.002A

## 2019-03-31 LAB — CBC WITH DIFFERENTIAL/PLATELET
Abs Immature Granulocytes: 0.12 10*3/uL — ABNORMAL HIGH (ref 0.00–0.07)
Basophils Absolute: 0.1 10*3/uL (ref 0.0–0.1)
Basophils Relative: 1 %
Eosinophils Absolute: 0.2 10*3/uL (ref 0.0–0.5)
Eosinophils Relative: 1 %
HCT: 40.5 % (ref 36.0–46.0)
Hemoglobin: 11.9 g/dL — ABNORMAL LOW (ref 12.0–15.0)
Immature Granulocytes: 1 %
Lymphocytes Relative: 7 %
Lymphs Abs: 1.2 10*3/uL (ref 0.7–4.0)
MCH: 26 pg (ref 26.0–34.0)
MCHC: 29.4 g/dL — ABNORMAL LOW (ref 30.0–36.0)
MCV: 88.6 fL (ref 80.0–100.0)
Monocytes Absolute: 0.9 10*3/uL (ref 0.1–1.0)
Monocytes Relative: 6 %
Neutro Abs: 14.2 10*3/uL — ABNORMAL HIGH (ref 1.7–7.7)
Neutrophils Relative %: 84 %
Platelets: 353 10*3/uL (ref 150–400)
RBC: 4.57 MIL/uL (ref 3.87–5.11)
RDW: 16.3 % — ABNORMAL HIGH (ref 11.5–15.5)
WBC: 16.8 10*3/uL — ABNORMAL HIGH (ref 4.0–10.5)
nRBC: 0 % (ref 0.0–0.2)

## 2019-03-31 LAB — BASIC METABOLIC PANEL
Anion gap: 10 (ref 5–15)
BUN: 7 mg/dL (ref 6–20)
CO2: 29 mmol/L (ref 22–32)
Calcium: 9.1 mg/dL (ref 8.9–10.3)
Chloride: 99 mmol/L (ref 98–111)
Creatinine, Ser: 0.58 mg/dL (ref 0.44–1.00)
GFR calc Af Amer: >60 mL/min (ref >60)
GFR calc non Af Amer: >60 mL/min (ref >60)
Glucose, Bld: 103 mg/dL — ABNORMAL HIGH (ref 70–99)
Potassium: 5 mmol/L (ref 3.5–5.1)
Sodium: 138 mmol/L (ref 135–145)

## 2019-03-31 MED ORDER — DEXTROSE 5 % IV SOLN
3.0000 g | INTRAVENOUS | Status: AC
  Administered 2019-04-01: 3 g via INTRAVENOUS
  Filled 2019-03-31: qty 3

## 2019-03-31 NOTE — Anesthesia Preprocedure Evaluation (Addendum)
Anesthesia Evaluation  Patient identified by MRN, date of birth, ID band Patient awake    Reviewed: Allergy & Precautions, NPO status , Patient's Chart, lab work & pertinent test results  Airway Mallampati: II  TM Distance: >3 FB     Dental   Pulmonary COPD, Current Smoker,    breath sounds clear to auscultation       Cardiovascular hypertension,  Rhythm:Regular Rate:Normal     Neuro/Psych    GI/Hepatic Neg liver ROS, GERD  ,  Endo/Other  negative endocrine ROS  Renal/GU negative Renal ROS     Musculoskeletal   Abdominal   Peds  Hematology   Anesthesia Other Findings   Reproductive/Obstetrics                            Anesthesia Physical Anesthesia Plan  ASA: III  Anesthesia Plan: General   Post-op Pain Management:    Induction: Intravenous  PONV Risk Score and Plan: 2 and Ondansetron, Dexamethasone, Midazolam and Treatment may vary due to age or medical condition  Airway Management Planned: LMA  Additional Equipment:   Intra-op Plan:   Post-operative Plan: Possible Post-op intubation/ventilation  Informed Consent: I have reviewed the patients History and Physical, chart, labs and discussed the procedure including the risks, benefits and alternatives for the proposed anesthesia with the patient or authorized representative who has indicated his/her understanding and acceptance.     Dental advisory given  Plan Discussed with: Anesthesiologist and CRNA  Anesthesia Plan Comments: (See PAT note 03/31/2019, Konrad Felix, PA-C)     Anesthesia Quick Evaluation

## 2019-03-31 NOTE — Progress Notes (Signed)
Anesthesia Chart Review   Case: [id number] Date/Time: 04/01/19 1335   Procedure: IRRIGATION AND DEBRIDEMENT HIP (Left )   Anesthesia type: Spinal   Pre-op diagnosis: Left hip wound dehiscence   [facility] ROOM 08 / [facility]   Surgeon: Samson FredericSwinteck, Brian, MD      DISCUSSION:53 y.o. current every day smoker (30 pack years) with h/o HTN, COPD O2 dependent (4L continuous), sleep apnea noncompliant with CPAP, GERD, left hip wound dehiscence s/p THA 02/18/2019 scheduled for above procedure 04/01/2023 with Dr. Samson FredericBrian Swinteck.   Pt last seen by pulmonologist 02/17/2019.  This was prior to left hip replacement 02/18/2019.  Per OV note, "We discussed that she is high pulmonary risk for her surgery tomorrow and she was not able to wean the prednisone."   No anesthesia complications noted with THA 02/18/2019.    Anticipate pt can proceed with planned procedure barring acute status change.    VS: BP (!) 143/93 (BP Location: Right Arm)   Pulse 97   Temp 37.6 C (Oral)   Resp 18   Ht 5\' 3"  (1.6 m)   Wt 86.8 kg   SpO2 100%   BMI 33.89 kg/m   PROVIDERS: [name], Michael C, MD is PCP    LABS: Labs reviewed: Acceptable for surgery. (all labs ordered are listed, but only abnormal results are displayed)  Labs Reviewed  BASIC METABOLIC PANEL - Abnormal; Notable for the following components:      Result Value   Glucose, Bld 103 (*)    All other components within normal limits  CBC WITH DIFFERENTIAL/PLATELET - Abnormal; Notable for the following components:   WBC 16.8 (*)    Hemoglobin 11.9 (*)    MCHC 29.4 (*)    RDW 16.3 (*)    Neutro Abs 14.2 (*)    Abs Immature Granulocytes 0.12 (*)    All other components within normal limits     IMAGES:   EKG: 02/15/2019 Rate 100 bpm Normal sinus rhythm   CV: Echo 07/21/17 Study Conclusions  - Left ventricle: The cavity size was normal. Systolic function was   normal. The estimated ejection fraction was in the range of 60%   to 65%. Wall motion  was normal; there were no regional wall   motion abnormalities. Left ventricular diastolic function   parameters were normal. - Aortic valve: Poorly visualized. Trileaflet; normal thickness,   mildly calcified leaflets. There was mild stenosis. Mean gradient   (S): 18 mm Hg. - Pulmonary arteries: Systolic pressure could not be accurately   estimated. - Pericardium, extracardiac: A prominent pericardial fat pad was   present. Past Medical History:  Diagnosis Date  . Allergic rhinitis   . Asthma   . COPD (chronic obstructive pulmonary disease) (HCC)    on home O2  . GERD (gastroesophageal reflux disease)   . Hypertension   . Open wound of left hip    spouse changes dressing q day  . Shortness of breath   . Sleep apnea    NO DEVICE IN USE, USES SUPPLEMENTAL O2 IN PLACE OF DEVICE   . Supplemental oxygen dependent    4L CONTINUOUS     Past Surgical History:  Procedure Laterality Date  . COLONOSCOPY    . HUMERUS IM NAIL Left 07/23/2017   Procedure: INTRAMEDULLARY (IM) NAIL HUMERAL;  Surgeon: Yolonda Kidaogers, Jason Patrick, MD;  [facility];  Service: Orthopedics;  Laterality: Left;  . LUMBAR LAMINECTOMY/DECOMPRESSION MICRODISCECTOMY  04/06/2012   Procedure: LUMBAR LAMINECTOMY/DECOMPRESSION MICRODISCECTOMY;  Surgeon: Fayrene FearingJames  Howell Pringle, MD;  Location: [place];  Service: Orthopedics;  Laterality: N/A;  Left L3-4 and L4-5 lateral recess decompression MIS  . NECK SURGERY  Sept 2005  . TOTAL ABDOMINAL HYSTERECTOMY  Oct 2003   partial  . TOTAL HIP ARTHROPLASTY Left 02/18/2019   Procedure: TOTAL HIP ARTHROPLASTY ANTERIOR APPROACH;  Surgeon: Rod Can, MD;  Location: [facility];  Service: Orthopedics;  Laterality: Left;  . TUBAL LIGATION  1995    MEDICATIONS: . albuterol (PROVENTIL) (2.5 MG/3ML) 0.083% nebulizer solution  . ALPRAZolam (XANAX) 0.5 MG tablet  . aspirin 81 MG chewable tablet  . azithromycin (ZITHROMAX) 250 MG tablet  . budesonide-formoterol (SYMBICORT) 160-4.5 MCG/ACT inhaler  .  cetirizine (ZYRTEC) 10 MG tablet  . docusate sodium (COLACE) 100 MG capsule  . esomeprazole (NEXIUM) 40 MG capsule  . FLUoxetine (PROZAC) 40 MG capsule  . fluticasone (FLONASE) 50 MCG/ACT nasal spray  . gabapentin (NEURONTIN) 400 MG capsule  . guaiFENesin-dextromethorphan (ROBITUSSIN DM) 100-10 MG/5ML syrup  . HYDROcodone-acetaminophen (NORCO) 10-325 MG tablet  . loperamide (IMODIUM A-D) 2 MG tablet  . losartan (COZAAR) 100 MG tablet  . magic mouthwash SOLN  . montelukast (SINGULAIR) 10 MG tablet  . morphine (MS CONTIN) 15 MG 12 hr tablet  . nystatin (MYCOSTATIN) 100000 UNIT/ML suspension  . nystatin (NYSTATIN) powder  . ondansetron (ZOFRAN) 4 MG tablet  . oxyCODONE 10 MG TABS  . OXYGEN  . predniSONE (DELTASONE) 10 MG tablet  . senna (SENOKOT) 8.6 MG TABS tablet  . Tetrahydrozoline HCl (REDNESS RELIEVER EYE DROPS OP)  . Tiotropium Bromide Monohydrate (SPIRIVA RESPIMAT) 2.5 MCG/ACT AERS   No current facility-administered medications for this encounter.    Derrill Memo ON 04/01/2019] ceFAZolin (ANCEF) 3 g in dextrose 5 % 50 mL IVPB    Maia Plan [facility] Pre-Surgical Testing [phone] 03/31/19  3:27 PM

## 2019-04-01 ENCOUNTER — Encounter (HOSPITAL_COMMUNITY): Payer: Self-pay | Admitting: *Deleted

## 2019-04-01 ENCOUNTER — Encounter (HOSPITAL_COMMUNITY)
Admission: RE | Disposition: A | Discharge status: Home / Self Care | Payer: Self-pay | Source: Home / Self Care | Attending: Orthopedic Surgery

## 2019-04-01 ENCOUNTER — Observation Stay (HOSPITAL_COMMUNITY)
Admission: RE | Admit: 2019-04-01 | Discharge: 2019-04-02 | Disposition: A | Discharge status: Home / Self Care | Payer: Medicare Other | Attending: Orthopedic Surgery | Admitting: Orthopedic Surgery

## 2019-04-01 ENCOUNTER — Ambulatory Visit (HOSPITAL_COMMUNITY): Payer: Medicare Other | Admitting: Certified Registered Nurse Anesthetist

## 2019-04-01 ENCOUNTER — Ambulatory Visit (HOSPITAL_COMMUNITY): Payer: Medicare Other | Admitting: Physician Assistant

## 2019-04-01 ENCOUNTER — Other Ambulatory Visit: Payer: Self-pay

## 2019-04-01 DIAGNOSIS — J449 Chronic obstructive pulmonary disease, unspecified: Secondary | ICD-10-CM | POA: Diagnosis not present

## 2019-04-01 DIAGNOSIS — F17210 Nicotine dependence, cigarettes, uncomplicated: Secondary | ICD-10-CM | POA: Insufficient documentation

## 2019-04-01 DIAGNOSIS — L7682 Other postprocedural complications of skin and subcutaneous tissue: Secondary | ICD-10-CM | POA: Insufficient documentation

## 2019-04-01 DIAGNOSIS — Z79899 Other long term (current) drug therapy: Secondary | ICD-10-CM | POA: Insufficient documentation

## 2019-04-01 DIAGNOSIS — T8131XD Disruption of external operation (surgical) wound, not elsewhere classified, subsequent encounter: Secondary | ICD-10-CM | POA: Diagnosis not present

## 2019-04-01 DIAGNOSIS — I10 Essential (primary) hypertension: Secondary | ICD-10-CM | POA: Insufficient documentation

## 2019-04-01 DIAGNOSIS — I96 Gangrene, not elsewhere classified: Secondary | ICD-10-CM | POA: Diagnosis not present

## 2019-04-01 DIAGNOSIS — Z7951 Long term (current) use of inhaled steroids: Secondary | ICD-10-CM | POA: Insufficient documentation

## 2019-04-01 DIAGNOSIS — Z880 Allergy status to penicillin: Secondary | ICD-10-CM | POA: Insufficient documentation

## 2019-04-01 DIAGNOSIS — G4730 Sleep apnea, unspecified: Secondary | ICD-10-CM | POA: Insufficient documentation

## 2019-04-01 DIAGNOSIS — K219 Gastro-esophageal reflux disease without esophagitis: Secondary | ICD-10-CM | POA: Insufficient documentation

## 2019-04-01 DIAGNOSIS — Z79891 Long term (current) use of opiate analgesic: Secondary | ICD-10-CM | POA: Insufficient documentation

## 2019-04-01 DIAGNOSIS — Z888 Allergy status to other drugs, medicaments and biological substances status: Secondary | ICD-10-CM | POA: Insufficient documentation

## 2019-04-01 DIAGNOSIS — G894 Chronic pain syndrome: Secondary | ICD-10-CM | POA: Insufficient documentation

## 2019-04-01 DIAGNOSIS — Z7982 Long term (current) use of aspirin: Secondary | ICD-10-CM | POA: Insufficient documentation

## 2019-04-01 DIAGNOSIS — T8131XA Disruption of external operation (surgical) wound, not elsewhere classified, initial encounter: Secondary | ICD-10-CM | POA: Diagnosis present

## 2019-04-01 DIAGNOSIS — Y838 Other surgical procedures as the cause of abnormal reaction of the patient, or of later complication, without mention of misadventure at the time of the procedure: Secondary | ICD-10-CM | POA: Insufficient documentation

## 2019-04-01 DIAGNOSIS — Z7952 Long term (current) use of systemic steroids: Secondary | ICD-10-CM | POA: Insufficient documentation

## 2019-04-01 DIAGNOSIS — F419 Anxiety disorder, unspecified: Secondary | ICD-10-CM | POA: Insufficient documentation

## 2019-04-01 DIAGNOSIS — Z9981 Dependence on supplemental oxygen: Secondary | ICD-10-CM | POA: Insufficient documentation

## 2019-04-01 DIAGNOSIS — Z882 Allergy status to sulfonamides status: Secondary | ICD-10-CM | POA: Insufficient documentation

## 2019-04-01 HISTORY — PX: INCISION AND DRAINAGE HIP: SHX1801

## 2019-04-01 SURGERY — IRRIGATION AND DEBRIDEMENT HIP
Anesthesia: General | Site: Hip | Laterality: Left

## 2019-04-01 MED ORDER — ONDANSETRON HCL 4 MG/2ML IJ SOLN
4.0000 mg | Freq: Four times a day (QID) | INTRAMUSCULAR | Status: DC | PRN
  Administered 2019-04-01: 18:00:00 4 mg via INTRAVENOUS
  Filled 2019-04-01: qty 2

## 2019-04-01 MED ORDER — NYSTATIN 100000 UNIT/ML MT SUSP: 5.0000 mL | Freq: Every day | OROMUCOSAL | Status: DC | PRN

## 2019-04-01 MED ORDER — PHENYLEPHRINE 40 MCG/ML (10ML) SYRINGE FOR IV PUSH (FOR BLOOD PRESSURE SUPPORT)
PREFILLED_SYRINGE | INTRAVENOUS | Status: AC
  Filled 2019-04-01: qty 10

## 2019-04-01 MED ORDER — TIOTROPIUM BROMIDE MONOHYDRATE 2.5 MCG/ACT IN AERS: 2.0000 | INHALATION_SPRAY | Freq: Every evening | RESPIRATORY_TRACT | Status: DC

## 2019-04-01 MED ORDER — LOPERAMIDE HCL 2 MG PO CAPS: 4.0000 mg | ORAL_CAPSULE | ORAL | Status: DC | PRN

## 2019-04-01 MED ORDER — PHENYLEPHRINE 40 MCG/ML (10ML) SYRINGE FOR IV PUSH (FOR BLOOD PRESSURE SUPPORT)
PREFILLED_SYRINGE | INTRAVENOUS | Status: DC | PRN
  Administered 2019-04-01 (×4): 120 ug via INTRAVENOUS
  Administered 2019-04-01: 160 ug via INTRAVENOUS
  Administered 2019-04-01: 120 ug via INTRAVENOUS
  Administered 2019-04-01: 180 ug via INTRAVENOUS
  Administered 2019-04-01: 120 ug via INTRAVENOUS

## 2019-04-01 MED ORDER — LACTATED RINGERS IV SOLN
INTRAVENOUS | Status: DC
  Administered 2019-04-01: 12:00:00 via INTRAVENOUS

## 2019-04-01 MED ORDER — ONDANSETRON HCL 4 MG/2ML IJ SOLN
INTRAMUSCULAR | Status: DC | PRN
  Administered 2019-04-01: 4 mg via INTRAVENOUS

## 2019-04-01 MED ORDER — MOMETASONE FURO-FORMOTEROL FUM 200-5 MCG/ACT IN AERO
2.0000 | INHALATION_SPRAY | Freq: Two times a day (BID) | RESPIRATORY_TRACT | Status: DC
  Administered 2019-04-01 – 2019-04-02 (×2): 2 via RESPIRATORY_TRACT
  Filled 2019-04-01: qty 8.8

## 2019-04-01 MED ORDER — DEXAMETHASONE SODIUM PHOSPHATE 10 MG/ML IJ SOLN
INTRAMUSCULAR | Status: AC
  Filled 2019-04-01: qty 1

## 2019-04-01 MED ORDER — FENTANYL CITRATE (PF) 100 MCG/2ML IJ SOLN
INTRAMUSCULAR | Status: DC | PRN
  Administered 2019-04-01 (×2): 25 ug via INTRAVENOUS

## 2019-04-01 MED ORDER — CEFAZOLIN SODIUM-DEXTROSE 2-4 GM/100ML-% IV SOLN
2.0000 g | Freq: Four times a day (QID) | INTRAVENOUS | Status: AC
  Administered 2019-04-01 – 2019-04-02 (×3): 2 g via INTRAVENOUS
  Filled 2019-04-01 (×3): qty 100

## 2019-04-01 MED ORDER — KETOROLAC TROMETHAMINE 30 MG/ML IJ SOLN
INTRAMUSCULAR | Status: AC
  Filled 2019-04-01: qty 1

## 2019-04-01 MED ORDER — DEXAMETHASONE SODIUM PHOSPHATE 10 MG/ML IJ SOLN
INTRAMUSCULAR | Status: DC | PRN
  Administered 2019-04-01: 10 mg via INTRAVENOUS

## 2019-04-01 MED ORDER — SODIUM CHLORIDE 0.9 % IV SOLN
INTRAVENOUS | Status: DC | PRN
  Administered 2019-04-01: 19:00:00 1000 mL via INTRAVENOUS

## 2019-04-01 MED ORDER — BUPIVACAINE-EPINEPHRINE (PF) 0.25% -1:200000 IJ SOLN
INTRAMUSCULAR | Status: AC
  Filled 2019-04-01: qty 30

## 2019-04-01 MED ORDER — HYDROMORPHONE HCL 1 MG/ML IJ SOLN
INTRAMUSCULAR | Status: AC
  Filled 2019-04-01: qty 1

## 2019-04-01 MED ORDER — LIDOCAINE 2% (20 MG/ML) 5 ML SYRINGE
INTRAMUSCULAR | Status: DC | PRN
  Administered 2019-04-01: 60 mg via INTRAVENOUS

## 2019-04-01 MED ORDER — HYDROCORTISONE NA SUCCINATE PF 100 MG IJ SOLR
50.0000 mg | Freq: Three times a day (TID) | INTRAMUSCULAR | Status: DC
  Administered 2019-04-01 – 2019-04-02 (×3): 50 mg via INTRAVENOUS
  Filled 2019-04-01 (×4): qty 1

## 2019-04-01 MED ORDER — PROPOFOL 10 MG/ML IV BOLUS
INTRAVENOUS | Status: DC | PRN
  Administered 2019-04-01: 50 mg via INTRAVENOUS
  Administered 2019-04-01: 150 mg via INTRAVENOUS

## 2019-04-01 MED ORDER — MORPHINE SULFATE (PF) 4 MG/ML IV SOLN: 0.5000 mg | INTRAVENOUS | Status: DC | PRN

## 2019-04-01 MED ORDER — SENNA 8.6 MG PO TABS
1.0000 | ORAL_TABLET | Freq: Two times a day (BID) | ORAL | Status: DC
  Administered 2019-04-01: 21:00:00 8.6 mg via ORAL
  Filled 2019-04-01 (×2): qty 1

## 2019-04-01 MED ORDER — MONTELUKAST SODIUM 10 MG PO TABS
10.0000 mg | ORAL_TABLET | Freq: Every day | ORAL | Status: DC
  Administered 2019-04-01: 21:00:00 10 mg via ORAL
  Filled 2019-04-01: qty 1

## 2019-04-01 MED ORDER — PROPOFOL 10 MG/ML IV BOLUS
INTRAVENOUS | Status: AC
  Filled 2019-04-01: qty 20

## 2019-04-01 MED ORDER — METOCLOPRAMIDE HCL 5 MG/ML IJ SOLN: 5.0000 mg | Freq: Three times a day (TID) | INTRAMUSCULAR | Status: DC | PRN

## 2019-04-01 MED ORDER — SODIUM CHLORIDE (PF) 0.9 % IJ SOLN
INTRAMUSCULAR | Status: AC
  Filled 2019-04-01: qty 50

## 2019-04-01 MED ORDER — HYDROMORPHONE HCL 1 MG/ML IJ SOLN
0.2500 mg | INTRAMUSCULAR | Status: DC | PRN
  Administered 2019-04-01 (×2): 0.5 mg via INTRAVENOUS

## 2019-04-01 MED ORDER — METOCLOPRAMIDE HCL 5 MG PO TABS: 5.0000 mg | ORAL_TABLET | Freq: Three times a day (TID) | ORAL | Status: DC | PRN

## 2019-04-01 MED ORDER — HYDROCODONE-ACETAMINOPHEN 5-325 MG PO TABS
1.0000 | ORAL_TABLET | ORAL | Status: DC | PRN
  Administered 2019-04-01: 2 via ORAL
  Filled 2019-04-01: qty 2

## 2019-04-01 MED ORDER — MIDAZOLAM HCL 5 MG/5ML IJ SOLN
INTRAMUSCULAR | Status: DC | PRN
  Administered 2019-04-01: 1 mg via INTRAVENOUS

## 2019-04-01 MED ORDER — SODIUM CHLORIDE 0.9 % IR SOLN
Status: DC | PRN
  Administered 2019-04-01: 1000 mL

## 2019-04-01 MED ORDER — SODIUM CHLORIDE 0.9 % IR SOLN
Status: DC | PRN
  Administered 2019-04-01: 3000 mL

## 2019-04-01 MED ORDER — HYDROCODONE-ACETAMINOPHEN 7.5-325 MG PO TABS
1.0000 | ORAL_TABLET | ORAL | Status: DC | PRN
  Administered 2019-04-02 (×3): 2 via ORAL
  Filled 2019-04-01 (×3): qty 2

## 2019-04-01 MED ORDER — ONDANSETRON HCL 4 MG/2ML IJ SOLN
INTRAMUSCULAR | Status: AC
  Filled 2019-04-01: qty 2

## 2019-04-01 MED ORDER — ALPRAZOLAM 0.25 MG PO TABS: 0.2500 mg | ORAL_TABLET | Freq: Every day | ORAL | Status: DC | PRN

## 2019-04-01 MED ORDER — UMECLIDINIUM BROMIDE 62.5 MCG/INH IN AEPB
1.0000 | INHALATION_SPRAY | Freq: Every day | RESPIRATORY_TRACT | Status: DC
  Administered 2019-04-02: 08:00:00 1 via RESPIRATORY_TRACT
  Filled 2019-04-01: qty 7

## 2019-04-01 MED ORDER — LORATADINE 10 MG PO TABS
10.0000 mg | ORAL_TABLET | Freq: Every day | ORAL | Status: DC
  Administered 2019-04-02: 09:00:00 10 mg via ORAL
  Filled 2019-04-01: qty 1

## 2019-04-01 MED ORDER — ALBUTEROL SULFATE (2.5 MG/3ML) 0.083% IN NEBU
INHALATION_SOLUTION | RESPIRATORY_TRACT | Status: AC
  Administered 2019-04-01: 2.5 mg via RESPIRATORY_TRACT
  Filled 2019-04-01: qty 3

## 2019-04-01 MED ORDER — MORPHINE SULFATE ER 15 MG PO TBCR
15.0000 mg | EXTENDED_RELEASE_TABLET | Freq: Three times a day (TID) | ORAL | Status: DC
  Administered 2019-04-01 – 2019-04-02 (×2): 15 mg via ORAL
  Filled 2019-04-01 (×2): qty 1

## 2019-04-01 MED ORDER — FLUOXETINE HCL 20 MG PO CAPS
40.0000 mg | ORAL_CAPSULE | Freq: Every day | ORAL | Status: DC
  Administered 2019-04-02: 09:00:00 40 mg via ORAL
  Filled 2019-04-01: qty 2

## 2019-04-01 MED ORDER — LOSARTAN POTASSIUM 50 MG PO TABS
100.0000 mg | ORAL_TABLET | Freq: Every day | ORAL | Status: DC
  Administered 2019-04-02: 09:00:00 100 mg via ORAL
  Filled 2019-04-01: qty 2

## 2019-04-01 MED ORDER — PREDNISONE 10 MG PO TABS: 10.0000 mg | ORAL_TABLET | Freq: Every day | ORAL | Status: DC

## 2019-04-01 MED ORDER — ACETAMINOPHEN 325 MG PO TABS: 325.0000 mg | ORAL_TABLET | Freq: Four times a day (QID) | ORAL | Status: DC | PRN

## 2019-04-01 MED ORDER — ALBUTEROL SULFATE (2.5 MG/3ML) 0.083% IN NEBU
2.5000 mg | INHALATION_SOLUTION | Freq: Once | RESPIRATORY_TRACT | Status: AC
  Administered 2019-04-01: 14:00:00 2.5 mg via RESPIRATORY_TRACT

## 2019-04-01 MED ORDER — DOCUSATE SODIUM 100 MG PO CAPS
100.0000 mg | ORAL_CAPSULE | Freq: Two times a day (BID) | ORAL | Status: DC
  Administered 2019-04-01: 21:00:00 100 mg via ORAL
  Filled 2019-04-01 (×2): qty 1

## 2019-04-01 MED ORDER — PANTOPRAZOLE SODIUM 40 MG PO TBEC
80.0000 mg | DELAYED_RELEASE_TABLET | Freq: Every day | ORAL | Status: DC
  Administered 2019-04-02: 12:00:00 80 mg via ORAL
  Filled 2019-04-01: qty 2

## 2019-04-01 MED ORDER — AZITHROMYCIN 250 MG PO TABS
250.0000 mg | ORAL_TABLET | Freq: Every day | ORAL | Status: DC
  Filled 2019-04-01: qty 1

## 2019-04-01 MED ORDER — GUAIFENESIN-DM 100-10 MG/5ML PO SYRP: 20.0000 mL | ORAL_SOLUTION | ORAL | Status: DC | PRN

## 2019-04-01 MED ORDER — ASPIRIN 81 MG PO CHEW
81.0000 mg | CHEWABLE_TABLET | Freq: Two times a day (BID) | ORAL | Status: DC
  Administered 2019-04-01 – 2019-04-02 (×2): 81 mg via ORAL
  Filled 2019-04-01 (×2): qty 1

## 2019-04-01 MED ORDER — FLUTICASONE PROPIONATE 50 MCG/ACT NA SUSP
1.0000 | Freq: Every day | NASAL | Status: DC | PRN
  Filled 2019-04-01: qty 16

## 2019-04-01 MED ORDER — MAGIC MOUTHWASH: 15.0000 mL | Freq: Three times a day (TID) | ORAL | Status: DC | PRN

## 2019-04-01 MED ORDER — ONDANSETRON HCL 4 MG PO TABS: 4.0000 mg | ORAL_TABLET | Freq: Four times a day (QID) | ORAL | Status: DC | PRN

## 2019-04-01 MED ORDER — DOXYCYCLINE HYCLATE 100 MG PO TABS
100.0000 mg | ORAL_TABLET | Freq: Two times a day (BID) | ORAL | Status: DC
  Administered 2019-04-01 – 2019-04-02 (×2): 100 mg via ORAL
  Filled 2019-04-01 (×3): qty 1

## 2019-04-01 MED ORDER — TETRAHYDROZOLINE HCL 0.05 % OP SOLN: 1.0000 [drp] | Freq: Every day | OPHTHALMIC | Status: DC | PRN

## 2019-04-01 MED ORDER — FENTANYL CITRATE (PF) 250 MCG/5ML IJ SOLN
INTRAMUSCULAR | Status: AC
  Filled 2019-04-01: qty 5

## 2019-04-01 MED ORDER — GABAPENTIN 400 MG PO CAPS
400.0000 mg | ORAL_CAPSULE | Freq: Three times a day (TID) | ORAL | Status: DC
  Administered 2019-04-01 – 2019-04-02 (×2): 400 mg via ORAL
  Filled 2019-04-01 (×2): qty 1

## 2019-04-01 MED ORDER — MIDAZOLAM HCL 2 MG/2ML IJ SOLN
INTRAMUSCULAR | Status: AC
  Filled 2019-04-01: qty 2

## 2019-04-01 MED ORDER — HYDROCODONE-ACETAMINOPHEN 10-325 MG PO TABS
1.0000 | ORAL_TABLET | Freq: Four times a day (QID) | ORAL | Status: DC | PRN
  Administered 2019-04-01: 18:00:00 1 via ORAL
  Filled 2019-04-01: qty 1

## 2019-04-01 MED ORDER — CHLORHEXIDINE GLUCONATE 4 % EX LIQD: 60.0000 mL | Freq: Once | CUTANEOUS | Status: DC

## 2019-04-01 SURGICAL SUPPLY — 59 items
ADH SKN CLS APL DERMABOND .7 (GAUZE/BANDAGES/DRESSINGS) ×1
APL PRP STRL LF DISP 70% ISPRP (MISCELLANEOUS) ×1
BAG SPEC THK2 15X12 ZIP CLS (MISCELLANEOUS) ×1
BAG ZIPLOCK 12X15 (MISCELLANEOUS) ×3 IMPLANT
CHLORAPREP W/TINT 26 (MISCELLANEOUS) ×3 IMPLANT
COVER SURGICAL LIGHT HANDLE (MISCELLANEOUS) ×3 IMPLANT
COVER WAND RF STERILE (DRAPES) IMPLANT
DERMABOND ADVANCED (GAUZE/BANDAGES/DRESSINGS) ×2
DERMABOND ADVANCED .7 DNX12 (GAUZE/BANDAGES/DRESSINGS) ×1 IMPLANT
DRAPE ORTHO SPLIT 77X108 STRL (DRAPES) ×6
DRAPE SURG 17X11 SM STRL (DRAPES) ×3 IMPLANT
DRAPE SURG ORHT 6 SPLT 77X108 (DRAPES) ×2 IMPLANT
DRAPE U-SHAPE 47X51 STRL (DRAPES) ×3 IMPLANT
DRESSING PREVENA PLUS CUSTOM (GAUZE/BANDAGES/DRESSINGS) IMPLANT
DRSG AQUACEL AG ADV 3.5X10 (GAUZE/BANDAGES/DRESSINGS) ×1 IMPLANT
DRSG PREVENA PLUS CUSTOM (GAUZE/BANDAGES/DRESSINGS) ×3
ELECT PENCIL ROCKER SW 15FT (MISCELLANEOUS) ×2 IMPLANT
ELECT REM PT RETURN 15FT ADLT (MISCELLANEOUS) ×3 IMPLANT
EVACUATOR 1/8 PVC DRAIN (DRAIN) ×1 IMPLANT
GAUZE SPONGE 2X2 8PLY STRL LF (GAUZE/BANDAGES/DRESSINGS) ×1 IMPLANT
GLOVE BIOGEL PI IND STRL 7.5 (GLOVE) ×1 IMPLANT
GLOVE BIOGEL PI IND STRL 8.5 (GLOVE) ×1 IMPLANT
GLOVE BIOGEL PI INDICATOR 7.5 (GLOVE) ×2
GLOVE BIOGEL PI INDICATOR 8.5 (GLOVE) ×2
GLOVE ECLIPSE 8.0 STRL XLNG CF (GLOVE) IMPLANT
GLOVE ORTHO TXT STRL SZ7.5 (GLOVE) ×6 IMPLANT
GLOVE SURG ORTHO 8.0 STRL STRW (GLOVE) ×3 IMPLANT
GOWN SPEC L3 XXLG W/TWL (GOWN DISPOSABLE) ×6 IMPLANT
GOWN STRL REUS W/TWL LRG LVL3 (GOWN DISPOSABLE) ×3 IMPLANT
HANDPIECE INTERPULSE COAX TIP (DISPOSABLE) ×3
HOOD PEEL AWAY FLYTE STAYCOOL (MISCELLANEOUS) ×8 IMPLANT
IV NS IRRIG 3000ML ARTHROMATIC (IV SOLUTION) ×3 IMPLANT
JET LAVAGE IRRISEPT WOUND (IRRIGATION / IRRIGATOR) ×3
KIT BASIN OR (CUSTOM PROCEDURE TRAY) ×3 IMPLANT
KIT DRSG PREVENA PLUS 7DAY 125 (MISCELLANEOUS) ×2 IMPLANT
KIT PREVENA INCISION MGT20CM45 (CANNISTER) ×2 IMPLANT
KIT TURNOVER KIT A (KITS) IMPLANT
LAVAGE JET IRRISEPT WOUND (IRRIGATION / IRRIGATOR) IMPLANT
MANIFOLD NEPTUNE II (INSTRUMENTS) ×3 IMPLANT
MARKER SKIN DUAL TIP RULER LAB (MISCELLANEOUS) ×2 IMPLANT
NDL SPNL 18GX3.5 QUINCKE PK (NEEDLE) IMPLANT
NEEDLE SPNL 18GX3.5 QUINCKE PK (NEEDLE) ×3 IMPLANT
PACK TOTAL JOINT (CUSTOM PROCEDURE TRAY) ×1 IMPLANT
PROTECTOR NERVE ULNAR (MISCELLANEOUS) ×3 IMPLANT
SET HNDPC FAN SPRY TIP SCT (DISPOSABLE) ×1 IMPLANT
SPONGE GAUZE 2X2 STER 10/PKG (GAUZE/BANDAGES/DRESSINGS)
STAPLER VISISTAT 35W (STAPLE) ×1 IMPLANT
SUT MNCRL AB 3-0 PS2 18 (SUTURE) ×2 IMPLANT
SUT MNCRL AB 4-0 PS2 18 (SUTURE) ×3 IMPLANT
SUT MON AB 2-0 CT1 36 (SUTURE) ×4 IMPLANT
SUT STRATAFIX PDO 1 14 VIOLET (SUTURE) ×3
SUT STRATFX PDO 1 14 VIOLET (SUTURE) ×1
SUT VIC AB 1 CT1 36 (SUTURE) ×2 IMPLANT
SUT VIC AB 2-0 CT1 27 (SUTURE) ×6
SUT VIC AB 2-0 CT1 TAPERPNT 27 (SUTURE) ×2 IMPLANT
SUTURE STRATFX PDO 1 14 VIOLET (SUTURE) IMPLANT
SWAB COLLECTION DEVICE MRSA (MISCELLANEOUS) ×1 IMPLANT
SWAB CULTURE ESWAB REG 1ML (MISCELLANEOUS) ×3 IMPLANT
TOWEL OR 17X26 10 PK STRL BLUE (TOWEL DISPOSABLE) ×2 IMPLANT

## 2019-04-01 NOTE — Anesthesia Postprocedure Evaluation (Signed)
Anesthesia Post Note  Patient: [name]  Procedure(s) Performed: IRRIGATION AND DEBRIDEMENT HIP (Left Hip)     Patient location during evaluation: PACU Anesthesia Type: General Level of consciousness: awake and alert Pain management: pain level controlled Vital Signs Assessment: post-procedure vital signs reviewed and stable Respiratory status: spontaneous breathing, nonlabored ventilation, respiratory function stable and patient connected to nasal cannula oxygen Cardiovascular status: blood pressure returned to baseline and stable Postop Assessment: no apparent nausea or vomiting Anesthetic complications: no    Last Vitals:  Vitals:   04/01/19 1645 04/01/19 1802  BP: 136/87 124/86  Pulse: 93 87  Resp: 18   Temp: 36.7 C   SpO2: 95%                   Effie Berkshire

## 2019-04-01 NOTE — Transfer of Care (Signed)
Immediate Anesthesia Transfer of Care Note  Patient: Kaylaann S Durrell  Procedure(s) Performed: IRRIGATION AND DEBRIDEMENT HIP (Left Hip)  Patient Location: PACU  Anesthesia Type:General  Level of Consciousness: awake, alert  and oriented  Airway & Oxygen Therapy: Patient Spontanous Breathing and Patient connected to face mask oxygen  Post-op Assessment: Report given to RN and Post -op Vital signs reviewed and stable  Post vital signs: Reviewed and stable  Last Vitals:  Vitals Value Taken Time  BP 138/86 04/01/19 1605  Temp    Pulse 95 04/01/19 1606  Resp 20 04/01/19 1606  SpO2 100 % 04/01/19 1606  Vitals shown include unvalidated device data.  Last Pain:  Vitals:   04/01/19 1155  TempSrc: Oral         Complications: No apparent anesthesia complications

## 2019-04-01 NOTE — Anesthesia Procedure Notes (Signed)
Procedure Name: LMA Insertion Date/Time: 04/01/2019 2:47 PM Performed by: Niel Hummer, CRNA Pre-anesthesia Checklist: Patient identified, Emergency Drugs available, Suction available and Patient being monitored Patient Re-evaluated:Patient Re-evaluated prior to induction Oxygen Delivery Method: Circle system utilized Preoxygenation: Pre-oxygenation with 100% oxygen Induction Type: IV induction LMA: LMA inserted LMA Size: 4.0 Dental Injury: Teeth and Oropharynx as per pre-operative assessment

## 2019-04-01 NOTE — Op Note (Signed)
OPERATIVE REPORT   04/01/2019  4:20 PM  PATIENT:  [name]   SURGEON:  Bertram Savin, MD  ASSISTANT:  Georgann Housekeeper, RNFA.   PREOPERATIVE DIAGNOSIS:  Left hip surgical wound dehiscence.  POSTOPERATIVE DIAGNOSIS:  Same.  PROCEDURE:  Excisional debridement of skin and subcutaneous tissue left hip with wound closure totaling 12 cm.  ANESTHESIA:   GETA.  ANTIBIOTICS:  2 g Ancef.  IMPLANTS:  None.  SPECIMENS:  Left hip superficial wound culture.  COMPLICATIONS:  None.  DISPOSITION:  Stable to PACU.  SURGICAL INDICATIONS:  [name] is a 53 y.o. female with a diagnosis of Left hip surgical wound dehiscence with necrosis of underlying fatty tissue after undergoing primary left total hip arthroplasty anterior approach on 02/18/2019.  Patient has multiple medical problems including COPD on 4 L home oxygen with daily prednisone usage, chronic pain syndrome on narcotics, and tobacco abuse.  She had a limited debridement and closure in the office 2 weeks ago which failed to completely resolve the issue.  She was indicated for formal surgical debridement and closure.  The risks, benefits, and alternatives were discussed with the patient preoperatively including but not limited to the risks of infection, bleeding, nerve / blood vessel injury, cardiopulmonary complications, the need for repeat surgery, among others, and the patient was willing to proceed.  PROCEDURE IN DETAIL: The patient was identified in the holding area using 2 identifiers.  The surgical site was marked by myself.  She was taken to the operating room, and general anesthesia was induced via LMA on the stretcher.  She was transferred to the [facility] table.  The left hip was prepped and draped in the normal sterile surgical fashion.  Timeout was called, verifying site and site of surgery.  She did receive IV antibiotics within 60 minutes of beginning the procedure.  I began by examining her left hip wound.  She  had approximately 1.5 cm area of skin dehiscence with underlying necrotic fatty tissue.  I used a #10 blade to excisionally debride her previous scar tract including the entire wound with underlying subcutaneous fatty tissue.  The underlying tissues remained intact.  The suture line lies deep to this area and was not visualized.  There are no fluid collections.  No purulent material.  I cultured the remaining wound bed for anaerobic and aerobic culture.  I created full-thickness skin flaps, and I mobilized the tissue with curved Mayo scissors.  I irrigated the wound with Irrisept solution followed by 3 L of normal saline with pulse lavage.  There is no evidence of deep infection.  Therefore, I did not enter the hip joint.  The wound was closed with layers using 2-0 Monocryl for the deep dermal layer and 2-0 nylon vertical mattress sutures for the skin.  Prevena incisional wound VAC was applied according to manufacturer's instructions and hooked up to suction with excellent seal.  The patient was then awakened from anesthesia and transferred to the PACU in stable condition.  Sponge, needle, and instrument counts were correct at the end of the case x2.  There were no known complications.  POSTOPERATIVE PLAN: Postoperatively, the patient will be admitted for overnight observation.  I will give IV stress dose steroids.  She will receive standard 24-hour IV antibiotic prophylaxis.  I will start her on oral doxycycline to protect her from infection.  Continue aspirin for DVT prophylaxis.  We will monitor intraoperative cultures.  Plan for discharge home tomorrow.  We will switch the  house VAC suction unit to the portable Prevena suction unit upon discharge.  She will return to the office in 7 days for incisional VAC removal.

## 2019-04-01 NOTE — H&P (Signed)
PREOPERATIVE H&P  Chief Complaint: Left hip wound dehiscence  HPI: [name] is a [age] y.o. female who presents for preoperative history and physical with a diagnosis of Left hip wound dehiscence.  She has elected for surgical management.  H/o L THA 02/18/19.   Past Medical History:  Diagnosis Date  . Allergic rhinitis   . Asthma   . COPD (chronic obstructive pulmonary disease) (HCC)    on home O2  . GERD (gastroesophageal reflux disease)   . Hypertension   . Open wound of left hip    spouse changes dressing q day  . Shortness of breath   . Sleep apnea    NO DEVICE IN USE, USES SUPPLEMENTAL O2 IN PLACE OF DEVICE   . Supplemental oxygen dependent    4L CONTINUOUS    Past Surgical History:  Procedure Laterality Date  . COLONOSCOPY    . HUMERUS IM NAIL Left 07/23/2017   Procedure: INTRAMEDULLARY (IM) NAIL HUMERAL;  Surgeon: Yolonda Kidaogers, Jason Patrick, MD;  [facility];  Service: Orthopedics;  Laterality: Left;  . LUMBAR LAMINECTOMY/DECOMPRESSION MICRODISCECTOMY  04/06/2012   Procedure: LUMBAR LAMINECTOMY/DECOMPRESSION MICRODISCECTOMY;  Surgeon: Kerrin ChampagneJames E Nitka, MD;  [facility];  Service: Orthopedics;  Laterality: N/A;  Left L3-4 and L4-5 lateral recess decompression MIS  . NECK SURGERY  Sept 2005  . TOTAL ABDOMINAL HYSTERECTOMY  Oct 2003   partial  . TOTAL HIP ARTHROPLASTY Left 02/18/2019   Procedure: TOTAL HIP ARTHROPLASTY ANTERIOR APPROACH;  Surgeon: Samson FredericSwinteck, Rayetta Veith, MD;  Location: [facility];  Service: Orthopedics;  Laterality: Left;  . TUBAL LIGATION  1995   Social History   Socioeconomic History  . Marital status: Married    Spouse name: Not on file  . Number of children: Y  . Years of education: Not on file  . Highest education level: Not on file  Occupational History  . Occupation: house wife  Social Needs  . Financial resource strain: Not on file  . Food insecurity    Worry: Not on file    Inability: Not on file  . Transportation needs    Medical: Not on file     Non-medical: Not on file  Tobacco Use  . Smoking status: Current Every Day Smoker    Packs/day: 1.00    Years: 30.00    Pack years: 30.00    Types: Cigarettes  . Smokeless tobacco: Never Used  . Tobacco comment: currently smoking 1/2 ppd.   Substance and Sexual Activity  . Alcohol use: Not Currently  . Drug use: No  . Sexual activity: Not on file  Lifestyle  . Physical activity    Days per week: Not on file    Minutes per session: Not on file  . Stress: Not on file  Relationships  . Social Musicianconnections    Talks on phone: Not on file    Gets together: Not on file    Attends religious service: Not on file    Active member of club or organization: Not on file    Attends meetings of clubs or organizations: Not on file    Relationship status: Not on file  Other Topics Concern  . Not on file  Social History Narrative   [place] lives in [place] with her husband.   Family History  Problem Relation Age of Onset  . Emphysema Father   . Rheum arthritis Father   . Lung cancer Father   . Emphysema Mother    Allergies  Allergen Reactions  .  Amoxicillin-Pot Clavulanate Itching    Ok with benadryl Did it involve swelling of the face/tongue/throat, SOB, or low BP? No Did it involve sudden or severe rash/hives, skin peeling, or any reaction on the inside of your mouth or nose? No Did you need to seek medical attention at a hospital or doctor's office? No When did it last happen?6 months If all above answers are "NO", may proceed with cephalosporin use.   . Erythromycin Rash    Ok with benadryl   . Moxifloxacin Itching    Reaction to Avelox - ok with benadryl  . Sulfamethoxazole-Trimethoprim Itching    Ok with benadryl   Prior to Admission medications   Medication Sig Start Date End Date Taking? Authorizing Provider  albuterol (PROVENTIL) (2.5 MG/3ML) 0.083% nebulizer solution Take 3 mLs (2.5 mg total) by nebulization every 4 (four) hours as needed for wheezing or  shortness of breath. 05/23/18  Yes Roney Jaffe, MD  ALPRAZolam Duanne Moron) 0.5 MG tablet Take 0.25-0.5 mg by mouth daily as needed for anxiety (shortness of breath).   Yes [provider]  aspirin 81 MG chewable tablet Chew 1 tablet (81 mg total) by mouth 2 (two) times daily. 02/19/19  Yes Maeleigh Buschman, Aaron Edelman, MD  azithromycin (ZITHROMAX) 250 MG tablet Take 250 mg by mouth daily.   Yes [provider]  budesonide-formoterol (SYMBICORT) 160-4.5 MCG/ACT inhaler Inhale 2 puffs 2 (two) times daily into the lungs.   Yes [provider]  cetirizine (ZYRTEC) 10 MG tablet Take 10 mg daily by mouth.   Yes [provider]  docusate sodium (COLACE) 100 MG capsule Take 1 capsule (100 mg total) by mouth 2 (two) times daily. Patient taking differently: Take 100 mg by mouth 2 (two) times daily as needed (constipation.).  02/19/19  Yes Kala Gassmann, Aaron Edelman, MD  esomeprazole (NEXIUM) 40 MG capsule Take 40 mg by mouth daily before breakfast.    Yes [provider]  FLUoxetine (PROZAC) 40 MG capsule Take 40 mg by mouth daily.   Yes [provider]  fluticasone (FLONASE) 50 MCG/ACT nasal spray Place 1 spray into both nostrils daily as needed for allergies.    Yes [provider]  gabapentin (NEURONTIN) 400 MG capsule Take 400 mg by mouth 3 (three) times daily. 05/03/18  Yes [provider]  HYDROcodone-acetaminophen (NORCO) 10-325 MG tablet Take 1 tablet by mouth 4 (four) times daily as needed (breakthrough pain).  02/02/19  Yes [provider]  losartan (COZAAR) 100 MG tablet Take 100 mg daily by mouth.  12/04/11  Yes [provider]  magic mouthwash SOLN Take 15 mLs by mouth 3 (three) times daily as needed for mouth pain. 02/20/19  Yes Shuford, Olivia Mackie, PA-C  montelukast (SINGULAIR) 10 MG tablet Take 10 mg at bedtime by mouth.   Yes [provider]  morphine (MS CONTIN) 15 MG 12 hr tablet Take 15 mg by mouth 3 (three) times daily.   Yes  [provider]  nystatin (MYCOSTATIN) 100000 UNIT/ML suspension Take 5 mLs by mouth daily as needed (thrush).   Yes [provider]  nystatin (NYSTATIN) powder Apply 1 g topically daily as needed (irritation).   Yes [provider]  OXYGEN Inhale 4 L into the lungs continuous.    Yes [provider]  predniSONE (DELTASONE) 10 MG tablet Take 10 mg by mouth daily with breakfast.   Yes [provider]  Tetrahydrozoline HCl (REDNESS RELIEVER EYE DROPS OP) Place 1 drop into both eyes daily as needed (redness).  Yes [provider]  Tiotropium Bromide Monohydrate (SPIRIVA RESPIMAT) 2.5 MCG/ACT AERS Inhale 2 puffs into the lungs every evening.    Yes [provider]  guaiFENesin-dextromethorphan (ROBITUSSIN DM) 100-10 MG/5ML syrup Take 20 mLs by mouth every 4 (four) hours as needed for cough.    [provider]  loperamide (IMODIUM A-D) 2 MG tablet Take 4 mg by mouth as needed for diarrhea or loose stools.    [provider]  ondansetron (ZOFRAN) 4 MG tablet Take 1 tablet (4 mg total) by mouth every 6 (six) hours as needed for nausea. Patient not taking: Reported on 03/30/2019 02/19/19   Samson FredericSwinteck, Corrinne Benegas, MD  oxyCODONE 10 MG TABS Take 1 tablet (10 mg total) by mouth every 4 (four) hours as needed for severe pain (pain score 7-10). Patient not taking: Reported on 03/30/2019 02/19/19   Samson FredericSwinteck, Kyrsten Deleeuw, MD  senna (SENOKOT) 8.6 MG TABS tablet Take 1 tablet (8.6 mg total) by mouth 2 (two) times daily. Patient not taking: Reported on 03/30/2019 02/19/19   Samson FredericSwinteck, Eri Mcevers, MD     Positive ROS: All other systems have been reviewed and were otherwise negative with the exception of those mentioned in the HPI and as above.  Physical Exam: General: Alert, no acute distress Cardiovascular: No pedal edema Respiratory: No cyanosis, no use of accessory musculature GI: No organomegaly, abdomen is soft and non-tender Skin: No lesions in the  area of chief complaint Neurologic: Sensation intact distally Psychiatric: Patient is competent for consent with normal mood and affect Lymphatic: No axillary or cervical lymphadenopathy  MUSCULOSKELETAL: 1.5 cm wound dehiscence with fatty necrosis. No purulence or infection. Painless ROM hip.  Assessment: Left hip wound dehiscence  Plan: Plan for Procedure(s): IRRIGATION AND DEBRIDEMENT HIP  The risks benefits and alternatives were discussed with the patient including but not limited to the risks of nonoperative treatment, versus surgical intervention including infection, bleeding, nerve injury,  blood clots, cardiopulmonary complications, morbidity, mortality, among others, and they were willing to proceed.   Jonette PesaBrian J Railey Glad, MD Cell [phone]   04/01/2019 12:54 PM

## 2019-04-01 NOTE — Discharge Instructions (Signed)
Keep VAC dressing clean and dry. Do not remove. Charge VAC unit nightly. Take antibiotics as prescribed. Call [phone] ASAP to schedule a follow up appointment on Friday 7/31 for VAC removal.

## 2019-04-02 ENCOUNTER — Encounter (HOSPITAL_COMMUNITY): Payer: Self-pay | Admitting: Orthopedic Surgery

## 2019-04-02 DIAGNOSIS — T8131XD Disruption of external operation (surgical) wound, not elsewhere classified, subsequent encounter: Secondary | ICD-10-CM | POA: Diagnosis not present

## 2019-04-02 LAB — BASIC METABOLIC PANEL
Anion gap: 10 (ref 5–15)
BUN: 8 mg/dL (ref 6–20)
CO2: 28 mmol/L (ref 22–32)
Calcium: 8.6 mg/dL — ABNORMAL LOW (ref 8.9–10.3)
Chloride: 97 mmol/L — ABNORMAL LOW (ref 98–111)
Creatinine, Ser: 0.59 mg/dL (ref 0.44–1.00)
GFR calc Af Amer: >60 mL/min (ref >60)
GFR calc non Af Amer: >60 mL/min (ref >60)
Glucose, Bld: 158 mg/dL — ABNORMAL HIGH (ref 70–99)
Potassium: 5.1 mmol/L (ref 3.5–5.1)
Sodium: 135 mmol/L (ref 135–145)

## 2019-04-02 LAB — CBC
HCT: 38.7 % (ref 36.0–46.0)
Hemoglobin: 11 g/dL — ABNORMAL LOW (ref 12.0–15.0)
MCH: 25.6 pg — ABNORMAL LOW (ref 26.0–34.0)
MCHC: 28.4 g/dL — ABNORMAL LOW (ref 30.0–36.0)
MCV: 90.2 fL (ref 80.0–100.0)
Platelets: 357 10*3/uL (ref 150–400)
RBC: 4.29 MIL/uL (ref 3.87–5.11)
RDW: 16.3 % — ABNORMAL HIGH (ref 11.5–15.5)
WBC: 8.3 10*3/uL (ref 4.0–10.5)
nRBC: 0 % (ref 0.0–0.2)

## 2019-04-02 MED ORDER — ALBUTEROL SULFATE (2.5 MG/3ML) 0.083% IN NEBU
2.5000 mg | INHALATION_SOLUTION | RESPIRATORY_TRACT | Status: DC | PRN
  Administered 2019-04-02 (×2): 2.5 mg via RESPIRATORY_TRACT
  Filled 2019-04-02: qty 3

## 2019-04-02 MED ORDER — HYDROCODONE-ACETAMINOPHEN 10-325 MG PO TABS: 1.0000 | ORAL_TABLET | Freq: Four times a day (QID) | ORAL | 0 refills | Status: DC | PRN

## 2019-04-02 MED ORDER — ALBUTEROL SULFATE (2.5 MG/3ML) 0.083% IN NEBU: 2.5000 mg | INHALATION_SOLUTION | Freq: Three times a day (TID) | RESPIRATORY_TRACT | Status: DC

## 2019-04-02 MED ORDER — MORPHINE SULFATE ER 15 MG PO TBCR: 15.0000 mg | EXTENDED_RELEASE_TABLET | Freq: Three times a day (TID) | ORAL | 0 refills | Status: DC

## 2019-04-02 MED ORDER — DOXYCYCLINE HYCLATE 100 MG PO TABS: 100.0000 mg | ORAL_TABLET | Freq: Two times a day (BID) | ORAL | 0 refills | Status: DC

## 2019-04-02 NOTE — Discharge Summary (Signed)
Physician Discharge Summary  Patient ID: Nichole RanchDrinda S Ullom MRN: [id number] DOB/AGE: [birth year] 53 y.o.  Admit date: 04/01/2019 Discharge date: 04/02/2019  Admission Diagnoses:  Wound dehiscence, surgical  Discharge Diagnoses:  Principal Problem:   Wound dehiscence, surgical Active Problems:   Surgical wound dehiscence   Past Medical History:  Diagnosis Date  . Allergic rhinitis   . Asthma   . COPD (chronic obstructive pulmonary disease) (HCC)    on home O2  . GERD (gastroesophageal reflux disease)   . Hypertension   . Open wound of left hip    spouse changes dressing q day  . Shortness of breath   . Sleep apnea    NO DEVICE IN USE, USES SUPPLEMENTAL O2 IN PLACE OF DEVICE   . Supplemental oxygen dependent    4L CONTINUOUS     Surgeries: Procedure(s): IRRIGATION AND DEBRIDEMENT HIP on 04/01/2019   Consultants (if any):   Discharged Condition: Improved  Hospital Course: Nichole RanchDrinda S Wagman is an 53 y.o. female who was admitted 04/01/2019 with a diagnosis of Wound dehiscence, surgical and went to the operating room on 04/01/2019 and underwent the above named procedures.    She was given perioperative antibiotics:  Anti-infectives (From admission, onward)   Start     Dose/Rate Route Frequency Ordered Stop   04/02/19 1000  azithromycin (ZITHROMAX) tablet 250 mg     250 mg Oral Daily 04/01/19 1729     04/02/19 0000  doxycycline (VIBRA-TABS) 100 MG tablet     100 mg Oral Every 12 hours 04/02/19 1008     04/01/19 2200  doxycycline (VIBRA-TABS) tablet 100 mg     100 mg Oral Every 12 hours 04/01/19 1729     04/01/19 2100  ceFAZolin (ANCEF) IVPB 2g/100 mL premix     2 g 200 mL/hr over 30 Minutes Intravenous Every 6 hours 04/01/19 1729 04/02/19 0953   04/01/19 0600  ceFAZolin (ANCEF) 3 g in dextrose 5 % 50 mL IVPB     3 g 100 mL/hr over 30 Minutes Intravenous On call to O.R. 03/31/19 [id number] 04/01/19 1517    .  She was given sequential compression devices, early ambulation, and ASA  for DVT prophylaxis.  She was given IV stress dose steroids.  She was place on antibiotics.  She benefited maximally from the hospital stay and there were no complications.    Discharged with portable Prevena iVAC.  Recent vital signs:  Vitals:   04/02/19 0821 04/02/19 1004  BP:  101/85  Pulse:  91  Resp:  16  Temp:  98.9 F (37.2 C)  SpO2: 96% 92%    Recent laboratory studies:  Lab Results  Component Value Date   HGB 11.0 (L) 04/02/2019   HGB 11.9 (L) 03/31/2019   HGB 10.3 (L) 02/20/2019   Lab Results  Component Value Date   WBC 8.3 04/02/2019   PLT 357 04/02/2019   Lab Results  Component Value Date   INR 0.9 02/15/2019   Lab Results  Component Value Date   NA 135 04/02/2019   K 5.1 04/02/2019   CL 97 (L) 04/02/2019   CO2 28 04/02/2019   BUN 8 04/02/2019   CREATININE 0.59 04/02/2019   GLUCOSE 158 (H) 04/02/2019    Discharge Medications:   Allergies as of 04/02/2019      Reactions   Amoxicillin-pot Clavulanate Itching   Ok with benadryl Did it involve swelling of the face/tongue/throat, SOB, or low BP? No Did it involve sudden or severe rash/hives,  skin peeling, or any reaction on the inside of your mouth or nose? No Did you need to seek medical attention at a hospital or doctor's office? No When did it last happen?6 months If all above answers are "NO", may proceed with cephalosporin use.   Erythromycin Rash   Ok with benadryl    Moxifloxacin Itching   Reaction to Avelox - ok with benadryl   Sulfamethoxazole-trimethoprim Itching   Ok with benadryl      Medication List    STOP taking these medications   Oxycodone HCl 10 MG Tabs     TAKE these medications   albuterol (2.5 MG/3ML) 0.083% nebulizer solution Commonly known as: PROVENTIL Take 3 mLs (2.5 mg total) by nebulization every 4 (four) hours as needed for wheezing or shortness of breath.   ALPRAZolam 0.5 MG tablet Commonly known as: XANAX Take 0.25-0.5 mg by mouth daily as needed  for anxiety (shortness of breath).   aspirin 81 MG chewable tablet Chew 1 tablet (81 mg total) by mouth 2 (two) times daily.   azithromycin 250 MG tablet Commonly known as: ZITHROMAX Take 250 mg by mouth daily.   budesonide-formoterol 160-4.5 MCG/ACT inhaler Commonly known as: SYMBICORT Inhale 2 puffs 2 (two) times daily into the lungs.   cetirizine 10 MG tablet Commonly known as: ZYRTEC Take 10 mg daily by mouth.   docusate sodium 100 MG capsule Commonly known as: COLACE Take 1 capsule (100 mg total) by mouth 2 (two) times daily. What changed:   when to take this  reasons to take this   doxycycline 100 MG tablet Commonly known as: VIBRA-TABS Take 1 tablet (100 mg total) by mouth every 12 (twelve) hours.   esomeprazole 40 MG capsule Commonly known as: NEXIUM Take 40 mg by mouth daily before breakfast.   FLUoxetine 40 MG capsule Commonly known as: PROZAC Take 40 mg by mouth daily.   fluticasone 50 MCG/ACT nasal spray Commonly known as: FLONASE Place 1 spray into both nostrils daily as needed for allergies.   gabapentin 400 MG capsule Commonly known as: NEURONTIN Take 400 mg by mouth 3 (three) times daily.   guaiFENesin-dextromethorphan 100-10 MG/5ML syrup Commonly known as: ROBITUSSIN DM Take 20 mLs by mouth every 4 (four) hours as needed for cough.   HYDROcodone-acetaminophen 10-325 MG tablet Commonly known as: NORCO Take 1 tablet by mouth every 6 (six) hours as needed (breakthrough pain). What changed: when to take this   loperamide 2 MG tablet Commonly known as: IMODIUM A-D Take 4 mg by mouth as needed for diarrhea or loose stools.   losartan 100 MG tablet Commonly known as: COZAAR Take 100 mg daily by mouth.   magic mouthwash Soln Take 15 mLs by mouth 3 (three) times daily as needed for mouth pain.   montelukast 10 MG tablet Commonly known as: SINGULAIR Take 10 mg at bedtime by mouth.   morphine 15 MG 12 hr tablet Commonly known as: MS  CONTIN Take 1 tablet (15 mg total) by mouth 3 (three) times daily.   nystatin 100000 UNIT/ML suspension Commonly known as: MYCOSTATIN Take 5 mLs by mouth daily as needed (thrush).   nystatin powder Generic drug: nystatin Apply 1 g topically daily as needed (irritation).   ondansetron 4 MG tablet Commonly known as: ZOFRAN Take 1 tablet (4 mg total) by mouth every 6 (six) hours as needed for nausea.   OXYGEN Inhale 4 L into the lungs continuous.   predniSONE 10 MG tablet Commonly known as: DELTASONE Take  10 mg by mouth daily with breakfast.   REDNESS RELIEVER EYE DROPS OP Place 1 drop into both eyes daily as needed (redness).   senna 8.6 MG Tabs tablet Commonly known as: SENOKOT Take 1 tablet (8.6 mg total) by mouth 2 (two) times daily.   Spiriva Respimat 2.5 MCG/ACT Aers Generic drug: Tiotropium Bromide Monohydrate Inhale 2 puffs into the lungs every evening.       Diagnostic Studies: No results found.  Disposition: Discharge disposition: 01-Home or Self Care       Discharge Instructions    Call MD / Call 911   Complete by: As directed    If you experience chest pain or shortness of breath, CALL 911 and be transported to the hospital emergency room.  If you develope a fever above 101 F, pus (white drainage) or increased drainage or redness at the wound, or calf pain, call your surgeon's office.   Constipation Prevention   Complete by: As directed    Drink plenty of fluids.  Prune juice may be helpful.  You may use a stool softener, such as Colace (over the counter) 100 mg twice a day.  Use MiraLax (over the counter) for constipation as needed.   Diet - low sodium heart healthy   Complete by: As directed    Increase activity slowly as tolerated   Complete by: As directed       Follow-up Information    Infant Doane, Arlys JohnBrian, MD. Schedule an appointment as soon as possible for a visit on 04/09/2019.   Specialty: Orthopedic Surgery [facility] Contact  information: [address] [place] [address] [phone]            Signed: Iline OvenBrian J Alla Sloma 04/02/2019, 10:08 AM

## 2019-04-02 NOTE — TOC Transition Note (Signed)
Transition of Care [facility]) - CM/SW Discharge Note   Patient Details  Name: [name] MRN: [id number] Date of Birth: 01/24/1966  [facility] of [facility]) CM/SW Contact:  Leeroy Cha, RN Phone Number: 04/02/2019, 10:13 AM   Clinical Narrative:    dcd to home   Final next level of care: Home/Self Care Barriers to Discharge: No Barriers Identified   Patient Goals and CMS Choice Patient states their goals for this hospitalization and ongoing recovery are:: to get rid of this hole in my leg CMS Medicare.gov Compare Post Acute Care list provided to:: Patient    Discharge Placement                       Discharge Plan and Services   Discharge Planning Services: CM Consult                                 Social Determinants of Health (SDOH) Interventions     Readmission Risk Interventions No flowsheet data found.

## 2019-04-02 NOTE — Progress Notes (Signed)
Subjective:  Patient reports pain as mild to moderate.  Denies N/V/CP/SOB. No c/o.  Objective:   VITALS:   Vitals:   04/02/19 0106 04/02/19 0209 04/02/19 0407 04/02/19 0821  BP: 111/84  114/82   Pulse: 84  77   Resp: 14  14   Temp: 97.7 F (36.5 C)  97.6 F (36.4 C)   TempSrc: Oral  Oral   SpO2: 95% 93% 98% 96%  Weight:      Height:        NAD ABD soft Sensation intact distally Intact pulses distally Dorsiflexion/Plantar flexion intact Incision: dressing C/D/I Compartment soft   Lab Results  Component Value Date   WBC 8.3 04/02/2019   HGB 11.0 (L) 04/02/2019   HCT 38.7 04/02/2019   MCV 90.2 04/02/2019   PLT 357 04/02/2019   BMET    Component Value Date/Time   NA 135 04/02/2019 0246   K 5.1 04/02/2019 0246   CL 97 (L) 04/02/2019 0246   CO2 28 04/02/2019 0246   GLUCOSE 158 (H) 04/02/2019 0246   BUN 8 04/02/2019 0246   CREATININE 0.59 04/02/2019 0246   CALCIUM 8.6 (L) 04/02/2019 0246   GFRNONAA >60 04/02/2019 0246   GFRAA >60 04/02/2019 0246    Recent Results (from the past 240 hour(s))  SARS Coronavirus 2 (Performed in [facility] hospital lab)     Status: None   Collection Time: 03/30/19  1:22 PM   Specimen: Nasal Swab  Result Value Ref Range Status   SARS Coronavirus 2 NEGATIVE NEGATIVE Final    Comment: (NOTE) SARS-CoV-2 target nucleic acids are NOT DETECTED. The SARS-CoV-2 RNA is generally detectable in upper and lower respiratory specimens during the acute phase of infection. Negative results do not preclude SARS-CoV-2 infection, do not rule out co-infections with other pathogens, and should not be used as the sole basis for treatment or other patient management decisions. Negative results must be combined with clinical observations, patient history, and epidemiological information. The expected result is Negative. Fact Sheet for Patients: HairSlick.nohttps://www.fda.gov/media/138098/download Fact Sheet for Healthcare Providers:  quierodirigir.comhttps://www.fda.gov/media/138095/download This test is not yet approved or cleared by the [place] FDA and  has been authorized for detection and/or diagnosis of SARS-CoV-2 by FDA under an Emergency Use Authorization (EUA). This EUA will remain  in effect (meaning this test can be used) for the duration of the COVID-19 declaration under Section 56 4(b)(1) of the Act, 21 U.S.C. section 360bbb-3(b)(1), unless the authorization is terminated or revoked sooner. Performed at [facility], [address] [place], [place] [address]   Aerobic/Anaerobic Culture (surgical/deep wound)     Status: None (Preliminary result)   Collection Time: 04/01/19  3:15 PM   Specimen: Wound  Result Value Ref Range Status   Specimen Description   Final    WOUND LEFT HIP Performed at [facility], [address] [place], [place] [address]    Special Requests   Final    PATIENT ON FOLLOWING ANCEF Performed at [facility], [address] [place], [place] [address]    Gram Stain   Final    NO WBC SEEN NO ORGANISMS SEEN Performed at [facility], [address] [place], [place] [address]    Culture PENDING  Incomplete   Report Status PENDING  Incomplete       Assessment/Plan: 1 Day Post-Op   Principal Problem:   Wound dehiscence, surgical Active Problems:   Surgical wound dehiscence  WBAT with walker DVT ppx: Aspirin, SCDs, TEDS PO pain control PT/OT Stress dose steroids Abx Dispo: D/C home, switch VAC to portable prevena unit upon d/c    Hilton Cork Madisynn Plair 04/02/2019, 10:04 AM   Rod Can, MD Cell: [phone] [facility]  [place] [address], [place], [place], [place] [address] Phone: [phone] [email or web address] Facebook  [employer]

## 2019-04-02 NOTE — Evaluation (Signed)
Physical Therapy Evaluation Patient Details Name: Nichole RanchDrinda S Morrison MRN: [id number] DOB: 24-Jan-1966 Today's Date: 04/02/2019   History of Present Illness  [age] yo female adm L hip wound dehiscence.  s/p L DA-THA on 02/18/19. 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  Clinical Impression  Patient evaluated by Physical Therapy with no further acute PT needs identified. All education has been completed and the patient has no further questions.  See below for any follow-up Physical Therapy or equipment needs. PT is signing off. Thank you for this referral.     Follow Up Recommendations No PT follow up    Equipment Recommendations  None recommended by PT    Recommendations for Other Services       Precautions / Restrictions Precautions Precautions: Fall Precaution Comments: COPD Home oxygen 4 lts, wound VAC Restrictions Weight Bearing Restrictions: No Other Position/Activity Restrictions: WBAT      Mobility  Bed Mobility Overal bed mobility: Needs Assistance Bed Mobility: Supine to Sit     Supine to sit: Supervision     General bed mobility comments: for lines  Transfers   Equipment used: Rolling walker (2 wheeled) Transfers: Sit to/from Stand   Stand pivot transfers: Supervision;Min guard       General transfer comment: for safety  Ambulation/Gait Ambulation/Gait assistance: Supervision;Min guard Gait Distance (Feet): 120 Feet Assistive device: Rolling walker (2 wheeled) Gait Pattern/deviations: Step-through pattern     General Gait Details: min-guard to supervision for safety, on 4L O2 throughout, 2-3/4 DOE  Information systems managertairs            Wheelchair Mobility    Modified Rankin (Stroke Patients Only)       Balance                                             Pertinent Vitals/Pain Pain Assessment: 0-10 Pain Score: 5  Pain Location: L hip Pain Descriptors / Indicators:  Sore Pain Intervention(s): Limited activity within patient's tolerance;Monitored during session    Home Living Family/patient expects to be discharged to:: Private residence Living Arrangements: Spouse/significant other   Type of Home: House Home Access: Stairs to enter   Secretary/administratorntrance Stairs-Number of Steps: 1 Home Layout: Able to live on main level with bedroom/bathroom;1/2 bath on main level;Two level Home Equipment: Walker - 2 wheels;Shower seat;Hand held shower head;Cane - single point;Bedside commode      Prior Function Level of Independence: Independent with assistive device(s)         Comments: amb with RW some of the time since THA in June     Hand Dominance        Extremity/Trunk Assessment   Upper Extremity Assessment Upper Extremity Assessment: Overall WFL for tasks assessed    Lower Extremity Assessment LLE Deficits / Details: grossly WFL, slightly limited hip flexion ~90 degrees d/t hip discomfort/VAC       Communication      Cognition                                              General Comments      Exercises     Assessment/Plan    PT Assessment Patent does not need any further PT services  PT  Problem List         PT Treatment Interventions      PT Goals (Current goals can be found in the Care Plan section)  Acute Rehab PT Goals Patient Stated Goal: go home PT Goal Formulation: All assessment and education complete, DC therapy    Frequency     Barriers to discharge        Co-evaluation               AM-PAC PT "6 Clicks" Mobility  Outcome Measure Help needed turning from your back to your side while in a flat bed without using bedrails?: None Help needed moving from lying on your back to sitting on the side of a flat bed without using bedrails?: None Help needed moving to and from a bed to a chair (including a wheelchair)?: None Help needed standing up from a chair using your arms (e.g., wheelchair or  bedside chair)?: None Help needed to walk in hospital room?: None Help needed climbing 3-5 steps with a railing? : A Little 6 Click Score: 23    End of Session Equipment Utilized During Treatment: Gait belt;Oxygen Activity Tolerance: Patient tolerated treatment well Patient left: in chair;with call bell/phone within reach;with chair alarm set Nurse Communication: Mobility status PT Visit Diagnosis: Other abnormalities of gait and mobility (R26.89)    Time: 1040-1106 PT Time Calculation (min) (ACUTE ONLY): 26 min   Charges:   PT Evaluation $PT Eval Low Complexity: 1 Low PT Treatments $Gait Training: 8-22 mins        Kenyon Ana, PT  Pager: [phone] Acute Rehab Dept [facility]): [phone]   04/02/2019   [facility] 04/02/2019, 12:12 PM

## 2019-04-02 NOTE — Care Management Obs Status (Signed)
[place] NOTIFICATION   Patient Details  Name: [name] MRN: [id number] Date of Birth: Jan 29, 1966   Medicare Observation Status Notification Given:  Yes    Leeroy Cha, RN 04/02/2019, 9:07 AM

## 2019-04-02 NOTE — Progress Notes (Signed)
Pt requesting albuterol nebulization treatment that she uses at home. Notified [facility] on-call service. Received a call back from [facility]. Okay to order pts home dose of albuterol nebulization.

## 2019-04-06 LAB — AEROBIC/ANAEROBIC CULTURE W GRAM STAIN (SURGICAL/DEEP WOUND)
Culture: NO GROWTH
Gram Stain: NONE SEEN

## 2019-11-30 ENCOUNTER — Other Ambulatory Visit: Payer: Self-pay | Admitting: Pain Medicine

## 2019-11-30 DIAGNOSIS — R519 Headache, unspecified: Secondary | ICD-10-CM

## 2019-11-30 DIAGNOSIS — M542 Cervicalgia: Secondary | ICD-10-CM

## 2019-12-31 ENCOUNTER — Other Ambulatory Visit: Payer: Self-pay

## 2019-12-31 ENCOUNTER — Other Ambulatory Visit: Payer: Medicare Other

## 2019-12-31 ENCOUNTER — Ambulatory Visit
Admission: RE | Admit: 2019-12-31 | Discharge: 2019-12-31 | Disposition: A | Discharge status: Home / Self Care | Payer: Medicare Other | Source: Ambulatory Visit | Attending: Pain Medicine | Admitting: Pain Medicine

## 2019-12-31 DIAGNOSIS — M542 Cervicalgia: Secondary | ICD-10-CM

## 2019-12-31 DIAGNOSIS — R519 Headache, unspecified: Secondary | ICD-10-CM

## 2020-07-25 ENCOUNTER — Encounter (HOSPITAL_COMMUNITY): Payer: Self-pay

## 2020-07-25 ENCOUNTER — Other Ambulatory Visit: Payer: Self-pay

## 2020-07-25 ENCOUNTER — Emergency Department (HOSPITAL_COMMUNITY): Payer: Medicare Other

## 2020-07-25 ENCOUNTER — Inpatient Hospital Stay (HOSPITAL_COMMUNITY)
Admission: EM | Admit: 2020-07-25 | Discharge: 2020-07-27 | DRG: 603 | Disposition: A | Discharge status: Home Health | Payer: Medicare Other | Attending: Internal Medicine | Admitting: Internal Medicine

## 2020-07-25 DIAGNOSIS — J99 Respiratory disorders in diseases classified elsewhere: Secondary | ICD-10-CM | POA: Diagnosis present

## 2020-07-25 DIAGNOSIS — Z7951 Long term (current) use of inhaled steroids: Secondary | ICD-10-CM

## 2020-07-25 DIAGNOSIS — Z880 Allergy status to penicillin: Secondary | ICD-10-CM

## 2020-07-25 DIAGNOSIS — Z9981 Dependence on supplemental oxygen: Secondary | ICD-10-CM

## 2020-07-25 DIAGNOSIS — E785 Hyperlipidemia, unspecified: Secondary | ICD-10-CM | POA: Diagnosis present

## 2020-07-25 DIAGNOSIS — G894 Chronic pain syndrome: Secondary | ICD-10-CM | POA: Diagnosis present

## 2020-07-25 DIAGNOSIS — Z8261 Family history of arthritis: Secondary | ICD-10-CM

## 2020-07-25 DIAGNOSIS — W19XXXA Unspecified fall, initial encounter: Secondary | ICD-10-CM | POA: Diagnosis present

## 2020-07-25 DIAGNOSIS — Z79899 Other long term (current) drug therapy: Secondary | ICD-10-CM

## 2020-07-25 DIAGNOSIS — E669 Obesity, unspecified: Secondary | ICD-10-CM | POA: Diagnosis present

## 2020-07-25 DIAGNOSIS — F17210 Nicotine dependence, cigarettes, uncomplicated: Secondary | ICD-10-CM | POA: Diagnosis present

## 2020-07-25 DIAGNOSIS — J9611 Chronic respiratory failure with hypoxia: Secondary | ICD-10-CM | POA: Diagnosis present

## 2020-07-25 DIAGNOSIS — G4730 Sleep apnea, unspecified: Secondary | ICD-10-CM | POA: Diagnosis present

## 2020-07-25 DIAGNOSIS — Z888 Allergy status to other drugs, medicaments and biological substances status: Secondary | ICD-10-CM

## 2020-07-25 DIAGNOSIS — Z96642 Presence of left artificial hip joint: Secondary | ICD-10-CM | POA: Diagnosis present

## 2020-07-25 DIAGNOSIS — G4733 Obstructive sleep apnea (adult) (pediatric): Secondary | ICD-10-CM | POA: Diagnosis present

## 2020-07-25 DIAGNOSIS — Z79891 Long term (current) use of opiate analgesic: Secondary | ICD-10-CM

## 2020-07-25 DIAGNOSIS — Z6837 Body mass index (BMI) 37.0-37.9, adult: Secondary | ICD-10-CM

## 2020-07-25 DIAGNOSIS — Z981 Arthrodesis status: Secondary | ICD-10-CM

## 2020-07-25 DIAGNOSIS — J449 Chronic obstructive pulmonary disease, unspecified: Secondary | ICD-10-CM | POA: Diagnosis present

## 2020-07-25 DIAGNOSIS — L03116 Cellulitis of left lower limb: Secondary | ICD-10-CM | POA: Diagnosis not present

## 2020-07-25 DIAGNOSIS — Z9119 Patient's noncompliance with other medical treatment and regimen: Secondary | ICD-10-CM

## 2020-07-25 DIAGNOSIS — Z882 Allergy status to sulfonamides status: Secondary | ICD-10-CM

## 2020-07-25 DIAGNOSIS — K219 Gastro-esophageal reflux disease without esophagitis: Secondary | ICD-10-CM | POA: Diagnosis present

## 2020-07-25 DIAGNOSIS — S81812A Laceration without foreign body, left lower leg, initial encounter: Secondary | ICD-10-CM | POA: Diagnosis present

## 2020-07-25 DIAGNOSIS — Z7952 Long term (current) use of systemic steroids: Secondary | ICD-10-CM

## 2020-07-25 DIAGNOSIS — Z825 Family history of asthma and other chronic lower respiratory diseases: Secondary | ICD-10-CM

## 2020-07-25 DIAGNOSIS — Z720 Tobacco use: Secondary | ICD-10-CM | POA: Diagnosis present

## 2020-07-25 DIAGNOSIS — Z7982 Long term (current) use of aspirin: Secondary | ICD-10-CM

## 2020-07-25 DIAGNOSIS — I10 Essential (primary) hypertension: Secondary | ICD-10-CM | POA: Diagnosis present

## 2020-07-25 DIAGNOSIS — Z20822 Contact with and (suspected) exposure to covid-19: Secondary | ICD-10-CM | POA: Diagnosis present

## 2020-07-25 DIAGNOSIS — J9610 Chronic respiratory failure, unspecified whether with hypoxia or hypercapnia: Secondary | ICD-10-CM | POA: Diagnosis present

## 2020-07-25 DIAGNOSIS — G8929 Other chronic pain: Secondary | ICD-10-CM | POA: Diagnosis present

## 2020-07-25 DIAGNOSIS — J309 Allergic rhinitis, unspecified: Secondary | ICD-10-CM | POA: Diagnosis present

## 2020-07-25 LAB — I-STAT VENOUS BLOOD GAS, ED
Acid-Base Excess: 11 mmol/L — ABNORMAL HIGH (ref 0.0–2.0)
Bicarbonate: 40.4 mmol/L — ABNORMAL HIGH (ref 20.0–28.0)
Calcium, Ion: 1.08 mmol/L — ABNORMAL LOW (ref 1.15–1.40)
HCT: 43 % (ref 36.0–46.0)
Hemoglobin: 14.6 g/dL (ref 12.0–15.0)
O2 Saturation: 87 %
Potassium: 4.3 mmol/L (ref 3.5–5.1)
Sodium: 132 mmol/L — ABNORMAL LOW (ref 135–145)
TCO2: 43 mmol/L — ABNORMAL HIGH (ref 22–32)
pCO2, Ven: 75.7 mmHg (ref 44.0–60.0)
pH, Ven: 7.336 (ref 7.250–7.430)
pO2, Ven: 60 mmHg — ABNORMAL HIGH (ref 32.0–45.0)

## 2020-07-25 LAB — CBC WITH DIFFERENTIAL/PLATELET
Abs Immature Granulocytes: 0.32 10*3/uL — ABNORMAL HIGH (ref 0.00–0.07)
Basophils Absolute: 0.1 10*3/uL (ref 0.0–0.1)
Basophils Relative: 1 %
Eosinophils Absolute: 0.1 10*3/uL (ref 0.0–0.5)
Eosinophils Relative: 1 %
HCT: 43.9 % (ref 36.0–46.0)
Hemoglobin: 12.9 g/dL (ref 12.0–15.0)
Immature Granulocytes: 2 %
Lymphocytes Relative: 13 %
Lymphs Abs: 2.4 10*3/uL (ref 0.7–4.0)
MCH: 27.1 pg (ref 26.0–34.0)
MCHC: 29.4 g/dL — ABNORMAL LOW (ref 30.0–36.0)
MCV: 92.2 fL (ref 80.0–100.0)
Monocytes Absolute: 1.4 10*3/uL — ABNORMAL HIGH (ref 0.1–1.0)
Monocytes Relative: 8 %
Neutro Abs: 13.6 10*3/uL — ABNORMAL HIGH (ref 1.7–7.7)
Neutrophils Relative %: 75 %
Platelets: 344 10*3/uL (ref 150–400)
RBC: 4.76 MIL/uL (ref 3.87–5.11)
RDW: 16.1 % — ABNORMAL HIGH (ref 11.5–15.5)
WBC: 17.9 10*3/uL — ABNORMAL HIGH (ref 4.0–10.5)
nRBC: 0 % (ref 0.0–0.2)

## 2020-07-25 LAB — BASIC METABOLIC PANEL
Anion gap: 10 (ref 5–15)
BUN: 8 mg/dL (ref 6–20)
CO2: 34 mmol/L — ABNORMAL HIGH (ref 22–32)
Calcium: 8.9 mg/dL (ref 8.9–10.3)
Chloride: 90 mmol/L — ABNORMAL LOW (ref 98–111)
Creatinine, Ser: 0.73 mg/dL (ref 0.44–1.00)
GFR, Estimated: >60 mL/min (ref >60)
Glucose, Bld: 112 mg/dL — ABNORMAL HIGH (ref 70–99)
Potassium: 4.3 mmol/L (ref 3.5–5.1)
Sodium: 134 mmol/L — ABNORMAL LOW (ref 135–145)

## 2020-07-25 LAB — CBG MONITORING, ED: Glucose-Capillary: 120 mg/dL — ABNORMAL HIGH (ref 70–99)

## 2020-07-25 MED ORDER — CEFAZOLIN SODIUM-DEXTROSE 1-4 GM/50ML-% IV SOLN
1.0000 g | Freq: Once | INTRAVENOUS | Status: AC
  Administered 2020-07-25: 1 g via INTRAVENOUS
  Filled 2020-07-25: qty 50

## 2020-07-25 MED ORDER — ALBUTEROL SULFATE HFA 108 (90 BASE) MCG/ACT IN AERS
2.0000 | INHALATION_SPRAY | Freq: Once | RESPIRATORY_TRACT | Status: DC
  Administered 2020-07-26: 2 via RESPIRATORY_TRACT
  Filled 2020-07-25: qty 6.7

## 2020-07-25 NOTE — ED Triage Notes (Signed)
Pts walker landed on her left lower leg, 4 inch laceration noted, bleeding controlled with gauze. Pt on 4l [place] chronically for COPD. Denies pain at this time

## 2020-07-25 NOTE — ED Provider Notes (Signed)
[name] EMERGENCY DEPARTMENT Provider Note   CSN: [id number] Arrival date & time: 07/25/20  1750     History Chief Complaint  Patient presents with  . Extremity Laceration    [name] is a 54 y.o. female.  The history is provided by the patient, medical records and a significant other.   [name] is a 54 y.o. female who presents to the Emergency Department complaining of extremity laceration. She presents the emergency department complaining of wound to her left leg. She states that her rolling walker fell and landed on her leg resulting in a laceration. She has a history of COPD and is on 4 L of oxygen at baseline. She denies any recent illnesses. Denies any fevers, chest pain, shortness of breath. She does not take any blood thinners. She usually wears compression stockings. Symptoms are moderate and constant nature.  Additional history available from the patient's husband after her initial assessment. He states that she did not sleep well last night, unclear the source. He states that she is more drowsy than her baseline. He also reports that her left leg has been more red and swollen over the last several days. She was recently treated with antibiotics about one week ago for a non-healing wound to the left heel.     Past Medical History:  Diagnosis Date  . Allergic rhinitis   . Asthma   . COPD (chronic obstructive pulmonary disease) (HCC)    on home O2  . GERD (gastroesophageal reflux disease)   . Hypertension   . Open wound of left hip    spouse changes dressing q day  . Shortness of breath   . Sleep apnea    NO DEVICE IN USE, USES SUPPLEMENTAL O2 IN PLACE OF DEVICE   . Supplemental oxygen dependent    4L CONTINUOUS     Patient Active Problem List   Diagnosis Date Noted  . Wound dehiscence, surgical 04/01/2019  . Surgical wound dehiscence 04/01/2019  . Osteoarthritis of left hip 02/18/2019  . Chronic respiratory failure with  hypoxia (HCC) 05/20/2018  . Chronic pain 05/20/2018  . Leukocytosis 05/20/2018  . Tobacco abuse 05/20/2018  . COPD with acute exacerbation (HCC) 05/20/2018  . Shortness of breath   . Essential hypertension   . GERD (gastroesophageal reflux disease)   . COPD (chronic obstructive pulmonary disease) (HCC)   . Asthma   . Closed fracture of left proximal humerus   . Obesity due to excess calories   . Surgery, elective   . Back pain   . Fall   . Near syncope   . Acute pain of left shoulder   . Closed displaced oblique fracture of shaft of left humerus 07/20/2017  . Lumbar spinal stenosis 04/03/2012    Class: Diagnosis of  . COPD exacerbation (HCC) 05/31/2010  . ALLERGIC RHINITIS 05/09/2010  . DYSPNEA ON EXERTION 05/09/2010    Past Surgical History:  Procedure Laterality Date  . COLONOSCOPY    . HUMERUS IM NAIL Left 07/23/2017   Procedure: INTRAMEDULLARY (IM) NAIL HUMERAL;  Surgeon: Yolonda Kida, MD;  Location: [facility] OR;  Service: Orthopedics;  Laterality: Left;  . INCISION AND DRAINAGE HIP Left 04/01/2019   Procedure: IRRIGATION AND DEBRIDEMENT HIP;  Surgeon: Samson Frederic, MD;  Location: [facility] ORS;  Service: Orthopedics;  Laterality: Left;  . LUMBAR LAMINECTOMY/DECOMPRESSION MICRODISCECTOMY  04/06/2012   Procedure: LUMBAR LAMINECTOMY/DECOMPRESSION MICRODISCECTOMY;  Surgeon: Kerrin Champagne, MD;  Location: [facility] OR;  Service: Orthopedics;  Laterality: N/A;  Left L3-4 and L4-5 lateral recess decompression MIS  . NECK SURGERY  Sept 2005  . TOTAL ABDOMINAL HYSTERECTOMY  Oct 2003   partial  . TOTAL HIP ARTHROPLASTY Left 02/18/2019   Procedure: TOTAL HIP ARTHROPLASTY ANTERIOR APPROACH;  Surgeon: Samson Frederic, MD;  Location: [facility] ORS;  Service: Orthopedics;  Laterality: Left;  . TUBAL LIGATION  1995     OB History   No obstetric history on file.     Family History  Problem Relation Age of Onset  . Emphysema Father   . Rheum arthritis Father   . Lung cancer Father   . Emphysema  Mother     Social History   Tobacco Use  . Smoking status: Current Every Day Smoker    Packs/day: 1.00    Years: 30.00    Pack years: 30.00    Types: Cigarettes  . Smokeless tobacco: Never Used  . Tobacco comment: currently smoking 1/2 ppd.   Vaping Use  . Vaping Use: Never used  Substance Use Topics  . Alcohol use: Not Currently  . Drug use: No    Home Medications Prior to Admission medications   Medication Sig Start Date End Date Taking? Authorizing Provider  albuterol (PROVENTIL) (2.5 MG/3ML) 0.083% nebulizer solution Take 3 mLs (2.5 mg total) by nebulization every 4 (four) hours as needed for wheezing or shortness of breath. 05/23/18   Delano Metz, MD  ALPRAZolam Prudy Feeler) 0.5 MG tablet Take 0.25-0.5 mg by mouth daily as needed for anxiety (shortness of breath).    [provider]  aspirin 81 MG chewable tablet Chew 1 tablet (81 mg total) by mouth 2 (two) times daily. 02/19/19   Swinteck, Arlys John, MD  azithromycin (ZITHROMAX) 250 MG tablet Take 250 mg by mouth daily.    [provider]  budesonide-formoterol (SYMBICORT) 160-4.5 MCG/ACT inhaler Inhale 2 puffs 2 (two) times daily into the lungs.    [provider]  cetirizine (ZYRTEC) 10 MG tablet Take 10 mg daily by mouth.    [provider]  docusate sodium (COLACE) 100 MG capsule Take 1 capsule (100 mg total) by mouth 2 (two) times daily. Patient taking differently: Take 100 mg by mouth 2 (two) times daily as needed (constipation.).  02/19/19   Swinteck, Arlys John, MD  doxycycline (VIBRA-TABS) 100 MG tablet Take 1 tablet (100 mg total) by mouth every 12 (twelve) hours. 04/02/19   Swinteck, Arlys John, MD  esomeprazole (NEXIUM) 40 MG capsule Take 40 mg by mouth daily before breakfast.     [provider]  FLUoxetine (PROZAC) 40 MG capsule Take 40 mg by mouth daily.    [provider]  fluticasone (FLONASE) 50 MCG/ACT nasal spray Place 1 spray into both nostrils daily as needed for  allergies.     [provider]  gabapentin (NEURONTIN) 400 MG capsule Take 400 mg by mouth 3 (three) times daily. 05/03/18   [provider]  guaiFENesin-dextromethorphan (ROBITUSSIN DM) 100-10 MG/5ML syrup Take 20 mLs by mouth every 4 (four) hours as needed for cough.    [provider]  HYDROcodone-acetaminophen (NORCO) 10-325 MG tablet Take 1 tablet by mouth every 6 (six) hours as needed (breakthrough pain). 04/02/19   Swinteck, Arlys John, MD  loperamide (IMODIUM A-D) 2 MG tablet Take 4 mg by mouth as needed for diarrhea or loose stools.    [provider]  losartan (COZAAR) 100 MG tablet Take 100 mg daily by mouth.  12/04/11   [provider]  magic mouthwash  SOLN Take 15 mLs by mouth 3 (three) times daily as needed for mouth pain. 02/20/19   Shuford, French Ana, PA-C  montelukast (SINGULAIR) 10 MG tablet Take 10 mg at bedtime by mouth.    [provider]  morphine (MS CONTIN) 15 MG 12 hr tablet Take 1 tablet (15 mg total) by mouth 3 (three) times daily. 04/02/19   Swinteck, Arlys John, MD  nystatin (MYCOSTATIN) 100000 UNIT/ML suspension Take 5 mLs by mouth daily as needed (thrush).    [provider]  nystatin (NYSTATIN) powder Apply 1 g topically daily as needed (irritation).    [provider]  ondansetron (ZOFRAN) 4 MG tablet Take 1 tablet (4 mg total) by mouth every 6 (six) hours as needed for nausea. Patient not taking: Reported on 03/30/2019 02/19/19   Samson Frederic, MD  OXYGEN Inhale 4 L into the lungs continuous.     [provider]  predniSONE (DELTASONE) 10 MG tablet Take 10 mg by mouth daily with breakfast.    [provider]  senna (SENOKOT) 8.6 MG TABS tablet Take 1 tablet (8.6 mg total) by mouth 2 (two) times daily. Patient not taking: Reported on 03/30/2019 02/19/19   Samson Frederic, MD  Tetrahydrozoline HCl (REDNESS RELIEVER EYE DROPS OP) Place 1 drop into both eyes daily as needed (redness).    [provider]  Tiotropium Bromide Monohydrate (SPIRIVA RESPIMAT) 2.5 MCG/ACT AERS Inhale 2 puffs into the lungs every evening.     [provider]    Allergies    Amoxicillin-pot clavulanate, Erythromycin, Moxifloxacin, and Sulfamethoxazole-trimethoprim  Review of Systems   Review of Systems  All other systems reviewed and are negative.   Physical Exam Updated Vital Signs BP (!) 103/56   Pulse 87   Temp 98.4 F (36.9 C) (Oral)   Resp 13   SpO2 100%   Physical Exam Vitals and nursing note reviewed.  Constitutional:      Appearance: She is well-developed.     Comments: Chronically ill appearing.  HENT:     Head: Normocephalic and atraumatic.  Cardiovascular:     Rate and Rhythm: Normal rate and regular rhythm.     Heart sounds: No murmur heard.   Pulmonary:     Effort: Pulmonary effort is normal. No respiratory distress.     Comments: Wheezes bilaterally Abdominal:     Palpations: Abdomen is soft.     Tenderness: There is no abdominal tenderness. There is no guarding or rebound.  Musculoskeletal:        General: No tenderness.     Comments: There is a 4 cm skin tear to the left distal anterior shin. 2+ DP pulses. There is erythema to the left calf, foot and heel. There is mild erythema to the right foot. The erythema to bilateral feet appears to be chronic venous stasis changes. The erythema to the calf and shin appear more acute. There is one plus pitting edema to the left lower extremity.  Skin:    General: Skin is warm and dry.  Neurological:     Mental Status: She is oriented to person, place, and time.     Comments: Drowsy but arouses to verbal stimuli  Psychiatric:        Behavior: Behavior normal.     ED Results / Procedures / Treatments   Labs (all labs ordered are listed, but only abnormal results are displayed) Labs Reviewed  BASIC METABOLIC PANEL - Abnormal; Notable for the following components:  Result Value   Sodium 134 (*)     Chloride 90 (*)    CO2 34 (*)    Glucose, Bld 112 (*)    All other components within normal limits  CBC WITH DIFFERENTIAL/PLATELET - Abnormal; Notable for the following components:   WBC 17.9 (*)    MCHC 29.4 (*)    RDW 16.1 (*)    Neutro Abs 13.6 (*)    Monocytes Absolute 1.4 (*)    Abs Immature Granulocytes 0.32 (*)    All other components within normal limits  CBG MONITORING, ED - Abnormal; Notable for the following components:   Glucose-Capillary 120 (*)    All other components within normal limits  I-STAT VENOUS BLOOD GAS, ED - Abnormal; Notable for the following components:   pCO2, Ven 75.7 (*)    pO2, Ven 60.0 (*)    Bicarbonate 40.4 (*)    TCO2 43 (*)    Acid-Base Excess 11.0 (*)    Sodium 132 (*)    Calcium, Ion 1.08 (*)    All other components within normal limits  RESPIRATORY PANEL BY RT PCR (FLU A&B, COVID)    EKG EKG Interpretation  Date/Time:  Tuesday July 25 2020 21:56:22 EST Ventricular Rate:  95 PR Interval:    QRS Duration: 88 QT Interval:  338 QTC Calculation: 425 R Axis:   77 Text Interpretation: Sinus rhythm Borderline repolarization abnormality Confirmed by Tilden Fossaees, Ladarrian Asencio [phone]([id number]) on 07/25/2020 10:02:33 PM   Radiology DG Chest Port 1 View  Result Date: 07/25/2020 CLINICAL DATA:  Change in mental status EXAM: PORTABLE CHEST 1 VIEW COMPARISON:  None. FINDINGS: The heart size and mediastinal contours are mildly enlarged. Aortic knob calcifications are seen. Overall shallow degree of aeration with subsegmental atelectasis at the lung bases. No large airspace consolidation or pleural effusion. No acute osseous abnormality. Cervical fixation hardware and left intramedullary rod is seen within the humerus. IMPRESSION: No active disease. Electronically Signed   By: Jonna ClarkBindu  Avutu M.D.   On: 07/25/2020 22:43    Procedures Procedures (including critical care time)  Medications Ordered in ED Medications  ceFAZolin (ANCEF) IVPB 1 g/50 mL premix (1 g  Intravenous New Bag/Given 07/25/20 2313)  albuterol (VENTOLIN HFA) 108 (90 Base) MCG/ACT inhaler 2 puff (has no administration in time range)    ED Course  I have reviewed the triage vital signs and the nursing notes.  Pertinent labs & imaging results that were available during my care of the patient were reviewed by me and considered in my medical decision making (see chart for details).    MDM Rules/Calculators/A&P                         patient with history of COPD on chronic home oxygen here for evaluation of lower extremity wound. She has a skin tear on examination that was re-approximated with Steri-Strips. Patient is ill appearing on evaluation with somnolence. Patient's husband states that she is more sleepy than baseline due to not sleeping well last night. He also reports increased erythema and swelling to the left lower extremity. Examination is concerning for cellulitis, has already been treated with a course of doxycycline with worsening. Cellulitis is in the area of her new wound. Plan to admit for IV antibiotics for cellulitis.  Final Clinical Impression(s) / ED Diagnoses Final diagnoses:  Cellulitis of left lower extremity  Skin tear of left lower leg without complication, initial encounter    Rx / DC  Orders ED Discharge Orders    None       Tilden Fossa, MD 07/25/20 2325

## 2020-07-25 NOTE — H&P (Signed)
History and Physical    [name]:[id number] DOB: 13-Jul-1966 DOA: 07/25/2020  PCP: [name], MD  Patient coming from: Home  I have personally briefly reviewed patient's old medical records in [facility]  Chief Complaint: Left leg laceration  HPI: [name] is a 54 y.o. female with medical history significant for COPD on chronic prednisone and azithromycin, chronic respiratory failure with hypoxia on 4 L supplemental O2 via [place] at all times, hypertension, hyperlipidemia, chronic pain on Norco and morphine, tobacco use, OSA nonadherent to CPAP who presents to the ED for evaluation of a laceration to her left lower leg.  Patient unable to provide history due to excessive somnolence and is otherwise obtained from EDP, chart review, and husband at bedside.  Patient has a known wound to her left heel for which she has completed a 7-day course of doxycycline by her PCP.  On 11/16 she suffered a laceration to her anterior lower leg after her rolling walker fell and hit her leg.  She had noticed redness proximal to the wound site.  She came to the ED for further evaluation.  Per ED physician, while in triage area patient had become excessively somnolent and hard to arouse.  Husband is at bedside and says that this has been ongoing for a long period of time.  He says she has sleep apnea but has not been using CPAP at night.  He says she wears 4 L supplemental O2 via [place] pretty much at all times.  She is on Norco and morphine for chronic pain.  He says she also uses Xanax for anxiety as needed, but this is infrequent.  He says she frequently sleeps for long periods of time.  She has headaches in the morning when she awakens and is frequently fatigued during the day.  ED Course:  Initial vitals showed BP 131/70, pulse 91, RR 13, temp 98.4 F, SPO2 98% on home 4 L supplemental O2 via [place].  Labs show WBC 17.9, hemoglobin 12.9, platelets 344,000, sodium 134, potassium 4.3, bicarb  34, BUN 8, creatinine 0.73, serum glucose 112.  Patient was noted to be drowsy and difficult to arouse while in the triage area.  VBG was obtained which showed pH 7.336, PCO2 75.7, PO2 60.0.  SARS-CoV-2 PCR panel was collected and pending.  Portable chest x-ray is negative for focal consolidation, edema, or effusion.  Cervical fixation hardware and left intramedullary rod within the humerus are noted.  Patient was given IV Ancef and albuterol inhaler treatment.  The hospitalist service was consulted to admit for further evaluation and management.  Review of Systems: All systems reviewed and are negative except as documented in history of present illness above.   Past Medical History:  Diagnosis Date  . Allergic rhinitis   . Asthma   . COPD (chronic obstructive pulmonary disease) (HCC)    on home O2  . GERD (gastroesophageal reflux disease)   . Hypertension   . Open wound of left hip    spouse changes dressing q day  . Shortness of breath   . Sleep apnea    NO DEVICE IN USE, USES SUPPLEMENTAL O2 IN PLACE OF DEVICE   . Supplemental oxygen dependent    4L CONTINUOUS     Past Surgical History:  Procedure Laterality Date  . COLONOSCOPY    . HUMERUS IM NAIL Left 07/23/2017   Procedure: INTRAMEDULLARY (IM) NAIL HUMERAL;  Surgeon: Yolonda Kida, MD;  Location: [facility] OR;  Service:  Orthopedics;  Laterality: Left;  . INCISION AND DRAINAGE HIP Left 04/01/2019   Procedure: IRRIGATION AND DEBRIDEMENT HIP;  Surgeon: Samson Frederic, MD;  Location: [facility] ORS;  Service: Orthopedics;  Laterality: Left;  . LUMBAR LAMINECTOMY/DECOMPRESSION MICRODISCECTOMY  04/06/2012   Procedure: LUMBAR LAMINECTOMY/DECOMPRESSION MICRODISCECTOMY;  Surgeon: Kerrin Champagne, MD;  Location: [facility] OR;  Service: Orthopedics;  Laterality: N/A;  Left L3-4 and L4-5 lateral recess decompression MIS  . NECK SURGERY  Sept 2005  . TOTAL ABDOMINAL HYSTERECTOMY  Oct 2003   partial  . TOTAL HIP ARTHROPLASTY Left 02/18/2019    Procedure: TOTAL HIP ARTHROPLASTY ANTERIOR APPROACH;  Surgeon: Samson Frederic, MD;  Location: [facility] ORS;  Service: Orthopedics;  Laterality: Left;  . TUBAL LIGATION  1995    Social History:  reports that she has been smoking cigarettes. She has a 30.00 pack-year smoking history. She has never used smokeless tobacco. She reports previous alcohol use. She reports that she does not use drugs.  Allergies  Allergen Reactions  . Amoxicillin-Pot Clavulanate Itching    Ok with benadryl Did it involve swelling of the face/tongue/throat, SOB, or low BP? No Did it involve sudden or severe rash/hives, skin peeling, or any reaction on the inside of your mouth or nose? No Did you need to seek medical attention at a hospital or doctor's office? No When did it last happen?6 months If all above answers are "NO", may proceed with cephalosporin use.   . Erythromycin Rash    Ok with benadryl   . Moxifloxacin Itching    Reaction to Avelox - ok with benadryl  . Sulfamethoxazole-Trimethoprim Itching    Ok with benadryl    Family History  Problem Relation Age of Onset  . Emphysema Father   . Rheum arthritis Father   . Lung cancer Father   . Emphysema Mother      Prior to Admission medications   Medication Sig Start Date End Date Taking? Authorizing Provider  albuterol (PROVENTIL) (2.5 MG/3ML) 0.083% nebulizer solution Take 3 mLs (2.5 mg total) by nebulization every 4 (four) hours as needed for wheezing or shortness of breath. 05/23/18   Delano Metz, MD  ALPRAZolam Prudy Feeler) 0.5 MG tablet Take 0.25-0.5 mg by mouth daily as needed for anxiety (shortness of breath).    [provider]  aspirin 81 MG chewable tablet Chew 1 tablet (81 mg total) by mouth 2 (two) times daily. 02/19/19   Swinteck, Arlys John, MD  azithromycin (ZITHROMAX) 250 MG tablet Take 250 mg by mouth daily.    [provider]  budesonide-formoterol (SYMBICORT) 160-4.5 MCG/ACT inhaler Inhale 2 puffs 2 (two) times daily  into the lungs.    [provider]  cetirizine (ZYRTEC) 10 MG tablet Take 10 mg daily by mouth.    [provider]  docusate sodium (COLACE) 100 MG capsule Take 1 capsule (100 mg total) by mouth 2 (two) times daily. Patient taking differently: Take 100 mg by mouth 2 (two) times daily as needed (constipation.).  02/19/19   Swinteck, Arlys John, MD  doxycycline (VIBRA-TABS) 100 MG tablet Take 1 tablet (100 mg total) by mouth every 12 (twelve) hours. 04/02/19   Swinteck, Arlys John, MD  esomeprazole (NEXIUM) 40 MG capsule Take 40 mg by mouth daily before breakfast.     [provider]  FLUoxetine (PROZAC) 40 MG capsule Take 40 mg by mouth daily.    [provider]  fluticasone (FLONASE) 50 MCG/ACT nasal spray Place 1 spray into both nostrils daily as needed for allergies.  [provider]  gabapentin (NEURONTIN) 400 MG capsule Take 400 mg by mouth 3 (three) times daily. 05/03/18   [provider]  guaiFENesin-dextromethorphan (ROBITUSSIN DM) 100-10 MG/5ML syrup Take 20 mLs by mouth every 4 (four) hours as needed for cough.    [provider]  HYDROcodone-acetaminophen (NORCO) 10-325 MG tablet Take 1 tablet by mouth every 6 (six) hours as needed (breakthrough pain). 04/02/19   Swinteck, Arlys John, MD  loperamide (IMODIUM A-D) 2 MG tablet Take 4 mg by mouth as needed for diarrhea or loose stools.    [provider]  losartan (COZAAR) 100 MG tablet Take 100 mg daily by mouth.  12/04/11   [provider]  magic mouthwash SOLN Take 15 mLs by mouth 3 (three) times daily as needed for mouth pain. 02/20/19   Shuford, French Ana, PA-C  montelukast (SINGULAIR) 10 MG tablet Take 10 mg at bedtime by mouth.    [provider]  morphine (MS CONTIN) 15 MG 12 hr tablet Take 1 tablet (15 mg total) by mouth 3 (three) times daily. 04/02/19   Swinteck, Arlys John, MD  nystatin (MYCOSTATIN) 100000 UNIT/ML suspension Take 5 mLs by mouth daily as needed (thrush).     [provider]  nystatin (NYSTATIN) powder Apply 1 g topically daily as needed (irritation).    [provider]  ondansetron (ZOFRAN) 4 MG tablet Take 1 tablet (4 mg total) by mouth every 6 (six) hours as needed for nausea. Patient not taking: Reported on 03/30/2019 02/19/19   Samson Frederic, MD  OXYGEN Inhale 4 L into the lungs continuous.     [provider]  predniSONE (DELTASONE) 10 MG tablet Take 10 mg by mouth daily with breakfast.    [provider]  senna (SENOKOT) 8.6 MG TABS tablet Take 1 tablet (8.6 mg total) by mouth 2 (two) times daily. Patient not taking: Reported on 03/30/2019 02/19/19   Samson Frederic, MD  Tetrahydrozoline HCl (REDNESS RELIEVER EYE DROPS OP) Place 1 drop into both eyes daily as needed (redness).    [provider]  Tiotropium Bromide Monohydrate (SPIRIVA RESPIMAT) 2.5 MCG/ACT AERS Inhale 2 puffs into the lungs every evening.     [provider]    Physical Exam: Vitals:   07/25/20 1758 07/25/20 1945 07/25/20 2158 07/25/20 2230  BP: 106/64 100/68 131/70 (!) 103/56  Pulse: (!) 56 88 91 87  Resp: 17 20 13 13   Temp: 98.6 F (37 C)  98.4 F (36.9 C)   TempSrc: Oral  Oral   SpO2: 98% 100% 98% 100%   Exam limited due to excessive somnolence. Constitutional: Obese woman in bed with head elevated.  Excessively somnolent, will only transiently awaken to voice and light physical stimuli before falling asleep again. Eyes: PERRL, lids and conjunctivae normal ENMT: Mucous membranes are moist. Posterior pharynx clear of any exudate or lesions.Normal dentition.  Neck: normal, supple, no masses. Respiratory: Coarse wheezing bilaterally anteriorly. No accessory muscle use.  Cardiovascular: Regular rate and rhythm, no murmurs / rubs / gallops. +1 bilateral lower extremity edema. 2+ pedal pulses. Abdomen: no tenderness, no masses palpated. No hepatosplenomegaly. Bowel sounds positive.  Musculoskeletal: no clubbing /  cyanosis. No joint deformity upper and lower extremities. Normal muscle tone.  Skin: Curved laceration anterior lower left leg as pictured below, Steri-Strips in place.  Has erythema and swelling proximally.  Linear wound on the left heel as well.  Is having serosanguineous drainage from the wounds.  Also with erythema of the toes on the  right foot.  Thick yellow nails on all toes. Neurologic: Limited due to excessive somnolence.  Awakens intermittently to stimuli and moves all extremities equally.  Sensation appears intact. Psychiatric: Excessively somnolent.        Labs on Admission: I have personally reviewed following labs and imaging studies  CBC: Recent Labs  Lab 07/25/20 2205 07/25/20 2226  WBC  --  17.9*  NEUTROABS  --  13.6*  HGB 14.6 12.9  HCT 43.0 43.9  MCV  --  92.2  PLT  --  344   Basic Metabolic Panel: Recent Labs  Lab 07/25/20 2205 07/25/20 2226  NA 132* 134*  K 4.3 4.3  CL  --  90*  CO2  --  34*  GLUCOSE  --  112*  BUN  --  8  CREATININE  --  0.73  CALCIUM  --  8.9   GFR: CrCl cannot be calculated (Unknown ideal weight.). Liver Function Tests: No results for input(s): AST, ALT, ALKPHOS, BILITOT, PROT, ALBUMIN in the last 168 hours. No results for input(s): LIPASE, AMYLASE in the last 168 hours. No results for input(s): AMMONIA in the last 168 hours. Coagulation Profile: No results for input(s): INR, PROTIME in the last 168 hours. Cardiac Enzymes: No results for input(s): CKTOTAL, CKMB, CKMBINDEX, TROPONINI in the last 168 hours. BNP (last 3 results) No results for input(s): PROBNP in the last 8760 hours. HbA1C: No results for input(s): HGBA1C in the last 72 hours. CBG: Recent Labs  Lab 07/25/20 2142  GLUCAP 120*   Lipid Profile: No results for input(s): CHOL, HDL, LDLCALC, TRIG, CHOLHDL, LDLDIRECT in the last 72 hours. Thyroid Function Tests: No results for input(s): TSH, T4TOTAL, FREET4, T3FREE, THYROIDAB in the last 72 hours. Anemia  Panel: No results for input(s): VITAMINB12, FOLATE, FERRITIN, TIBC, IRON, RETICCTPCT in the last 72 hours. Urine analysis:    Component Value Date/Time   COLORURINE STRAW (A) 02/15/2019 1309   APPEARANCEUR CLEAR 02/15/2019 1309   LABSPEC 1.002 (L) 02/15/2019 1309   PHURINE 7.0 02/15/2019 1309   GLUCOSEU NEGATIVE 02/15/2019 1309   HGBUR NEGATIVE 02/15/2019 1309   BILIRUBINUR NEGATIVE 02/15/2019 1309   KETONESUR NEGATIVE 02/15/2019 1309   PROTEINUR NEGATIVE 02/15/2019 1309   UROBILINOGEN 0.2 03/31/2012 1338   NITRITE NEGATIVE 02/15/2019 1309   LEUKOCYTESUR NEGATIVE 02/15/2019 1309    Radiological Exams on Admission: DG Chest Port 1 View  Result Date: 07/25/2020 CLINICAL DATA:  Change in mental status EXAM: PORTABLE CHEST 1 VIEW COMPARISON:  None. FINDINGS: The heart size and mediastinal contours are mildly enlarged. Aortic knob calcifications are seen. Overall shallow degree of aeration with subsegmental atelectasis at the lung bases. No large airspace consolidation or pleural effusion. No acute osseous abnormality. Cervical fixation hardware and left intramedullary rod is seen within the humerus. IMPRESSION: No active disease. Electronically Signed   By: Jonna ClarkBindu  Avutu M.D.   On: 07/25/2020 22:43    EKG: Personally reviewed. Sinus rhythm without acute ischemic changes.  Not significantly changed from prior.  Assessment/Plan Principal Problem:   Cellulitis of left lower extremity Active Problems:   Essential hypertension   COPD (chronic obstructive pulmonary disease) (HCC)   Chronic respiratory failure with hypoxia (HCC)   Chronic pain   Tobacco abuse   Sleep apnea  [name] is a 54 y.o. female with medical history significant for COPD on chronic prednisone and azithromycin, chronic respiratory failure with hypoxia on 4 L supplemental O2 via [place] at all times, hypertension, hyperlipidemia, chronic pain on  Norco and morphine, tobacco use, OSA nonadherent to CPAP who is admitted  with laceration and cellulitis of left upper extremity found to be excessively somnolent at time of admission.  COPD/chronic respiratory failure with hypoxia with excessive somnolence: Concerned she has been however oxygenated chronically.  She uses 4 L of home O2 via [place] at all times and is on chronic prednisone and azithromycin as an outpatient.  Goal O2 should be 88-92%.  I turned her O2 down to 1 L and has been maintaining O2 sats at 92% on this.  Likely also worsened from untreated OSA and oversedated from home pain medications.  She has coarse wheezing bilaterally. -Titrate supplemental O2 to keep SPO2 88-92% -Schedule duonebs with as needed albuterol nebulizers -Give IV Solu-Medrol 40 mg once now -Try BiPAP tonight as tolerated -Hold home Norco/morphine  Cellulitis and wounds of left lower extremity: Has acute laceration wound to anterior left lower shin and nonhealing left heel wound that has been reportedly present for 2 months.  Has associated surrounding cellulitis at acute wound site. -Continue IV Ancef -Wound care consultation  Hypertension: Hold antihypertensives due to borderline low BP.  Chronic pain: Holding home Norco and morphine due to oversedation.  Tobacco use: Continues to smoke at least half pack per day per patient's husband.  OSA: Nonadherent to CPAP at home.  Likely contributing to excessive somnolence.  Try BiPAP tonight.  DVT prophylaxis: Lovenox Code Status: Full code after discussion with patient's husband Family Communication: Discussed with patient's husband at bedside Disposition Plan: From home, dispo pending improvement in respiratory status and left leg cellulitis Consults called: None Admission status:  Status is: Observation  The patient remains OBS appropriate and will d/c before 2 midnights.  Dispo: The patient is from: Home              Anticipated d/c is to: Home              Anticipated d/c date is: 1 day              Patient  currently is not medically stable to d/c.   Darreld Mclean MD [facility]  If 7PM-7AM, please contact night-coverage [email or web address]  07/25/2020, 11:34 PM

## 2020-07-26 ENCOUNTER — Encounter (HOSPITAL_COMMUNITY): Payer: Self-pay | Admitting: Internal Medicine

## 2020-07-26 DIAGNOSIS — Z882 Allergy status to sulfonamides status: Secondary | ICD-10-CM | POA: Diagnosis not present

## 2020-07-26 DIAGNOSIS — L03116 Cellulitis of left lower limb: Principal | ICD-10-CM

## 2020-07-26 DIAGNOSIS — Z7951 Long term (current) use of inhaled steroids: Secondary | ICD-10-CM | POA: Diagnosis not present

## 2020-07-26 DIAGNOSIS — Z7952 Long term (current) use of systemic steroids: Secondary | ICD-10-CM | POA: Diagnosis not present

## 2020-07-26 DIAGNOSIS — R0989 Other specified symptoms and signs involving the circulatory and respiratory systems: Secondary | ICD-10-CM | POA: Diagnosis not present

## 2020-07-26 DIAGNOSIS — Z880 Allergy status to penicillin: Secondary | ICD-10-CM | POA: Diagnosis not present

## 2020-07-26 DIAGNOSIS — Z981 Arthrodesis status: Secondary | ICD-10-CM | POA: Diagnosis not present

## 2020-07-26 DIAGNOSIS — J309 Allergic rhinitis, unspecified: Secondary | ICD-10-CM | POA: Diagnosis present

## 2020-07-26 DIAGNOSIS — K219 Gastro-esophageal reflux disease without esophagitis: Secondary | ICD-10-CM | POA: Diagnosis present

## 2020-07-26 DIAGNOSIS — W19XXXA Unspecified fall, initial encounter: Secondary | ICD-10-CM | POA: Diagnosis present

## 2020-07-26 DIAGNOSIS — J449 Chronic obstructive pulmonary disease, unspecified: Secondary | ICD-10-CM | POA: Diagnosis present

## 2020-07-26 DIAGNOSIS — J9610 Chronic respiratory failure, unspecified whether with hypoxia or hypercapnia: Secondary | ICD-10-CM | POA: Diagnosis present

## 2020-07-26 DIAGNOSIS — Z9981 Dependence on supplemental oxygen: Secondary | ICD-10-CM | POA: Diagnosis not present

## 2020-07-26 DIAGNOSIS — Z96642 Presence of left artificial hip joint: Secondary | ICD-10-CM | POA: Diagnosis present

## 2020-07-26 DIAGNOSIS — G8929 Other chronic pain: Secondary | ICD-10-CM | POA: Diagnosis present

## 2020-07-26 DIAGNOSIS — G4733 Obstructive sleep apnea (adult) (pediatric): Secondary | ICD-10-CM | POA: Diagnosis present

## 2020-07-26 DIAGNOSIS — F17210 Nicotine dependence, cigarettes, uncomplicated: Secondary | ICD-10-CM | POA: Diagnosis present

## 2020-07-26 DIAGNOSIS — Z8261 Family history of arthritis: Secondary | ICD-10-CM | POA: Diagnosis not present

## 2020-07-26 DIAGNOSIS — I10 Essential (primary) hypertension: Secondary | ICD-10-CM | POA: Diagnosis present

## 2020-07-26 DIAGNOSIS — J9611 Chronic respiratory failure with hypoxia: Secondary | ICD-10-CM | POA: Diagnosis present

## 2020-07-26 DIAGNOSIS — Z825 Family history of asthma and other chronic lower respiratory diseases: Secondary | ICD-10-CM | POA: Diagnosis not present

## 2020-07-26 DIAGNOSIS — S81812A Laceration without foreign body, left lower leg, initial encounter: Secondary | ICD-10-CM | POA: Diagnosis present

## 2020-07-26 DIAGNOSIS — Z6837 Body mass index (BMI) 37.0-37.9, adult: Secondary | ICD-10-CM | POA: Diagnosis not present

## 2020-07-26 DIAGNOSIS — E669 Obesity, unspecified: Secondary | ICD-10-CM | POA: Diagnosis present

## 2020-07-26 DIAGNOSIS — Z888 Allergy status to other drugs, medicaments and biological substances status: Secondary | ICD-10-CM | POA: Diagnosis not present

## 2020-07-26 DIAGNOSIS — Z20822 Contact with and (suspected) exposure to covid-19: Secondary | ICD-10-CM | POA: Diagnosis present

## 2020-07-26 DIAGNOSIS — Z7982 Long term (current) use of aspirin: Secondary | ICD-10-CM | POA: Diagnosis not present

## 2020-07-26 LAB — RESPIRATORY PANEL BY RT PCR (FLU A&B, COVID)
Influenza A by PCR: NEGATIVE
Influenza B by PCR: NEGATIVE
SARS Coronavirus 2 by RT PCR: NEGATIVE

## 2020-07-26 LAB — HIV ANTIBODY (ROUTINE TESTING W REFLEX): HIV Screen 4th Generation wRfx: NONREACTIVE

## 2020-07-26 MED ORDER — PNEUMOCOCCAL VAC POLYVALENT 25 MCG/0.5ML IJ INJ: 0.5000 mL | INJECTION | INTRAMUSCULAR | Status: DC

## 2020-07-26 MED ORDER — ACETAMINOPHEN 325 MG PO TABS: 650.0000 mg | ORAL_TABLET | Freq: Four times a day (QID) | ORAL | Status: DC | PRN

## 2020-07-26 MED ORDER — ALBUTEROL SULFATE (2.5 MG/3ML) 0.083% IN NEBU
2.5000 mg | INHALATION_SOLUTION | RESPIRATORY_TRACT | Status: DC | PRN
  Administered 2020-07-26: 2.5 mg via RESPIRATORY_TRACT
  Filled 2020-07-26: qty 3

## 2020-07-26 MED ORDER — ENOXAPARIN SODIUM 40 MG/0.4ML ~~LOC~~ SOLN
40.0000 mg | SUBCUTANEOUS | Status: DC
  Administered 2020-07-26 – 2020-07-27 (×2): 40 mg via SUBCUTANEOUS
  Filled 2020-07-26 (×2): qty 0.4

## 2020-07-26 MED ORDER — METHYLPREDNISOLONE SODIUM SUCC 40 MG IJ SOLR
40.0000 mg | Freq: Once | INTRAMUSCULAR | Status: AC
  Administered 2020-07-26: 40 mg via INTRAVENOUS
  Filled 2020-07-26: qty 1

## 2020-07-26 MED ORDER — CEFAZOLIN SODIUM-DEXTROSE 2-4 GM/100ML-% IV SOLN
2.0000 g | Freq: Three times a day (TID) | INTRAVENOUS | Status: DC
  Administered 2020-07-26 – 2020-07-27 (×4): 2 g via INTRAVENOUS
  Filled 2020-07-26 (×4): qty 100

## 2020-07-26 MED ORDER — PREDNISONE 10 MG PO TABS
10.0000 mg | ORAL_TABLET | Freq: Every day | ORAL | Status: DC
  Administered 2020-07-27: 10 mg via ORAL
  Filled 2020-07-26: qty 1

## 2020-07-26 MED ORDER — IPRATROPIUM-ALBUTEROL 0.5-2.5 (3) MG/3ML IN SOLN
3.0000 mL | Freq: Four times a day (QID) | RESPIRATORY_TRACT | Status: DC
  Administered 2020-07-26 – 2020-07-27 (×5): 3 mL via RESPIRATORY_TRACT
  Filled 2020-07-26 (×4): qty 3

## 2020-07-26 MED ORDER — SODIUM CHLORIDE 0.9% FLUSH
3.0000 mL | Freq: Two times a day (BID) | INTRAVENOUS | Status: DC
  Administered 2020-07-26 – 2020-07-27 (×2): 3 mL via INTRAVENOUS

## 2020-07-26 MED ORDER — ONDANSETRON HCL 4 MG PO TABS: 4.0000 mg | ORAL_TABLET | Freq: Four times a day (QID) | ORAL | Status: DC | PRN

## 2020-07-26 MED ORDER — MORPHINE SULFATE ER 15 MG PO TBCR
15.0000 mg | EXTENDED_RELEASE_TABLET | Freq: Two times a day (BID) | ORAL | Status: DC
  Administered 2020-07-26 – 2020-07-27 (×2): 15 mg via ORAL
  Filled 2020-07-26 (×2): qty 1

## 2020-07-26 MED ORDER — FUROSEMIDE 10 MG/ML IJ SOLN
20.0000 mg | Freq: Once | INTRAMUSCULAR | Status: AC
  Administered 2020-07-26: 20 mg via INTRAVENOUS
  Filled 2020-07-26: qty 2

## 2020-07-26 MED ORDER — ACETAMINOPHEN 650 MG RE SUPP: 650.0000 mg | Freq: Four times a day (QID) | RECTAL | Status: DC | PRN

## 2020-07-26 MED ORDER — ONDANSETRON HCL 4 MG/2ML IJ SOLN: 4.0000 mg | Freq: Four times a day (QID) | INTRAMUSCULAR | Status: DC | PRN

## 2020-07-26 MED ORDER — AZITHROMYCIN 250 MG PO TABS
250.0000 mg | ORAL_TABLET | Freq: Every day | ORAL | Status: DC
  Administered 2020-07-27: 250 mg via ORAL
  Filled 2020-07-26 (×2): qty 1

## 2020-07-26 MED ORDER — DULOXETINE HCL 30 MG PO CPEP
30.0000 mg | ORAL_CAPSULE | Freq: Two times a day (BID) | ORAL | Status: DC
  Administered 2020-07-26 – 2020-07-27 (×2): 30 mg via ORAL
  Filled 2020-07-26 (×2): qty 1

## 2020-07-26 MED ORDER — MONTELUKAST SODIUM 10 MG PO TABS
10.0000 mg | ORAL_TABLET | Freq: Every day | ORAL | Status: DC
  Administered 2020-07-26: 10 mg via ORAL
  Filled 2020-07-26: qty 1

## 2020-07-26 NOTE — Progress Notes (Signed)
[facility] Progress Note  Patient: [name]    OEU:[id number]  DOA: 07/25/2020     Date of Service: the patient was seen and examined on 07/26/2020  Brief hospital course: Past medical history of COPD on chronic prednisone and azithromycin, chronic respiratory failure on 4 LPM, OSA noncompliant with CPAP, HTN, HLD, chronic pain syndrome.  Presents with complaints of fall with laceration of the left leg.  Found to have uncontrolled OSA currently being treated with CPAP therapy. Currently plan is monitor respiratory status.  Assessment and Plan: COPD/chronic respiratory failure with hypoxia with excessive somnolence: Concerned she has been however oxygenated chronically.  She uses 4 L of home O2 via [place] at all times and is on chronic prednisone and azithromycin as an outpatient.  Goal O2 should be 88-92%.  I turned her O2 down to 1 L and has been maintaining O2 sats at 92% on this.  Likely also worsened from untreated OSA and oversedated from home pain medications.  She has coarse wheezing bilaterally. -Titrate supplemental O2 to keep SPO2 88-92% -Schedule duonebs with as needed albuterol nebulizers Change BiPAP to CPAP.  Monitor.  Initiate low-dose of home medications.  Cellulitis and wounds of left lower extremity: Has acute laceration wound to anterior left lower shin and nonhealing left heel wound that has been reportedly present for 2 months.  Has associated surrounding cellulitis at acute wound site. -Continue IV Ancef -Wound care consultation  Hypertension: Hold antihypertensives due to borderline low BP.  Chronic pain: Holding home Norco and morphine due to oversedation.  Tobacco use: Continues to smoke at least half pack per day per patient's husband.  OSA: Obesity Nonadherent to CPAP at home.  Likely contributing to excessive somnolence.   Tolerating BiPAP.  Change to CPAP.  Monitor. Body mass index is 37.88 kg/m.   Diet: Cardiac diet carb modified DVT  Prophylaxis:   enoxaparin (LOVENOX) injection 40 mg Start: 07/26/20 1000    Advance goals of care discussion: Full code  Family Communication: family was present at bedside, at the time of interview.  The pt provided permission to discuss medical plan with the family. Opportunity was given to ask question and all questions were answered satisfactorily.   Disposition:  Status is: Inpatient  Remains inpatient appropriate because:Altered mental status   Dispo: The patient is from: Home              Anticipated d/c is to: Home              Anticipated d/c date is: 2 days              Patient currently is not medically stable to d/c.   Subjective: Continues to have shortness of breath.  Continues to have excessive sleepiness.  We will follow sleep mid conversation.  Occasional confusion as well when she is awake.  No nausea no vomiting.  No chest pain.  No fever no chills.  Physical Exam:  General: Appear in moderate distress, no Rash; Oral Mucosa Clear, moist. no Abnormal Neck Mass Or lumps, Conjunctiva normal  Cardiovascular: S1 and S2 Present, no Murmur, Respiratory: good respiratory effort, Bilateral Air entry present and bilateral  Crackles, no wheezes Abdomen: Bowel Sound present, Soft and no tenderness Extremities: bilateral  Pedal edema Neurology: lethargic and oriented to time, place, and person affect flat in affect. no new focal deficit Gait not checked due to patient safety concerns  Vitals:   07/26/20 0224 07/26/20 0359 07/26/20 0952 07/26/20 1254  BP:  102/80 109/74 (!) 156/86  Pulse: 94 95 78 (!) 104  Resp: 20 18 18 20   Temp:  98.4 F (36.9 C) 98.2 F (36.8 C)   TempSrc:  Oral Oral   SpO2: 94% 94% 92% 96%  Weight:      Height:        Intake/Output Summary (Last 24 hours) at 07/26/2020 1846 Last data filed at 07/26/2020 1547 Gross per 24 hour  Intake 208.98 ml  Output 800 ml  Net -591.02 ml   Filed Weights   07/26/20 0147  Weight: 97 kg    Data  Reviewed: I have personally reviewed and interpreted daily labs, tele strips, imagings as discussed above. I reviewed all nursing notes, pharmacy notes, vitals, pertinent old records I have discussed plan of care as described above with RN and patient/family.  CBC: Recent Labs  Lab 07/25/20 2205 07/25/20 2226  WBC  --  17.9*  NEUTROABS  --  13.6*  HGB 14.6 12.9  HCT 43.0 43.9  MCV  --  92.2  PLT  --  344   Basic Metabolic Panel: Recent Labs  Lab 07/25/20 2205 07/25/20 2226  NA 132* 134*  K 4.3 4.3  CL  --  90*  CO2  --  34*  GLUCOSE  --  112*  BUN  --  8  CREATININE  --  0.73  CALCIUM  --  8.9    Studies: DG Chest Port 1 View  Result Date: 07/25/2020 CLINICAL DATA:  Change in mental status EXAM: PORTABLE CHEST 1 VIEW COMPARISON:  None. FINDINGS: The heart size and mediastinal contours are mildly enlarged. Aortic knob calcifications are seen. Overall shallow degree of aeration with subsegmental atelectasis at the lung bases. No large airspace consolidation or pleural effusion. No acute osseous abnormality. Cervical fixation hardware and left intramedullary rod is seen within the humerus. IMPRESSION: No active disease. Electronically Signed   By: 07/27/2020 M.D.   On: 07/25/2020 22:43    Scheduled Meds: . azithromycin  250 mg Oral Daily  . DULoxetine  30 mg Oral BID  . enoxaparin (LOVENOX) injection  40 mg Subcutaneous Q24H  . ipratropium-albuterol  3 mL Nebulization Q6H  . montelukast  10 mg Oral QHS  . morphine  15 mg Oral Q12H  . [START ON 07/27/2020] pneumococcal 23 valent vaccine  0.5 mL Intramuscular Tomorrow-1000  . [START ON 07/27/2020] predniSONE  10 mg Oral Q breakfast  . sodium chloride flush  3 mL Intravenous Q12H   Continuous Infusions: .  ceFAZolin (ANCEF) IV 2 g (07/26/20 1411)   PRN Meds: acetaminophen **OR** acetaminophen, albuterol, ondansetron **OR** ondansetron (ZOFRAN) IV  Time spent: 35 minutes  Author: 07/28/20, MD [facility] 07/26/2020 6:46 PM  To reach On-call, see care teams to locate the attending and reach out via [email or web address]. Between 7PM-7AM, please contact night-coverage If you still have difficulty reaching the attending provider, please page the [facility] (Director on Call) for [facility] on amion for assistance.

## 2020-07-26 NOTE — Progress Notes (Signed)
The patient's bed was saturated with urine- when assisted up to the chair for clean up and linen change, the patient became very short of breath.  Respiratory therapy was notified for need of breathing treatment and was told that they were unable to come to assist  Respiratory treatment was given per nursing.  HOB elevated for comfort. BP 156/86 Heart rate 104  Resp 25  Oxygen saturation at 96% with treatment.  Continuous pulse oxygen monitor applied

## 2020-07-26 NOTE — Progress Notes (Signed)
RT placed pt on BIPAP dream station for the night 18/9 w/6Lpm bled into the system. Pt respiratory status stable w/no distress noted at this time. RT will continue to monitor.

## 2020-07-26 NOTE — Evaluation (Signed)
Physical Therapy Evaluation Patient Details Name: [name] MRN: [id number] DOB: 11-26-1965 Today's Date: 07/26/2020   History of Present Illness  Pt is a 54 y/o female admitted after sustaning cut on LLE. Pt with LLE cellulitis. PMH includes HTN, COPD on 4L of O2, tobacco use, OSA, and L THA.   Clinical Impression  Pt admitted secondary to problem above with deficits below. Pt requiring min A for bed mobility tasks this session. Pt very sleepy and increased alertness with mobility, however, continued to report feeling "out of it", and refused further mobility. Discussed mobility at home and option for SNF , however, pt refusing. Feel pt would benefit from max [facility] services at d/c. Will continue to follow acutely.     Follow Up Recommendations Home health PT;Supervision/Assistance - 24 hour (pt refusing SNF)    Equipment Recommendations  Other (comment) (TBD)    Recommendations for Other Services       Precautions / Restrictions Precautions Precautions: Fall Restrictions Weight Bearing Restrictions: No      Mobility  Bed Mobility Overal bed mobility: Needs Assistance Bed Mobility: Sit to Supine;Supine to Sit     Supine to sit: Min assist Sit to supine: Min assist   General bed mobility comments: Min A for LLE assist to come to sitting. Pt reports feeling woozy and "out of it", so requesting to defer further mobility.     Transfers                    Ambulation/Gait                Stairs            Wheelchair Mobility    Modified Rankin (Stroke Patients Only)       Balance Overall balance assessment: Needs assistance Sitting-balance support: No upper extremity supported;Feet supported Sitting balance-Leahy Scale: Fair                                       Pertinent Vitals/Pain Pain Assessment: Faces Faces Pain Scale: Hurts even more Pain Location: LLE Pain Descriptors / Indicators: Grimacing;Guarding;Moaning Pain  Intervention(s): Limited activity within patient's tolerance;Monitored during session;Repositioned    Home Living Family/patient expects to be discharged to:: Private residence Living Arrangements: Spouse/significant other Available Help at Discharge: Family;Available 24 hours/day Type of Home: House Home Access: Level entry     Home Layout: One level Home Equipment: Walker - 2 wheels;Shower seat      Prior Function Level of Independence: Needs assistance   Gait / Transfers Assistance Needed: Reports sometimes using RW at home.   ADL's / Homemaking Assistance Needed: Reports sometimes requires assist for ADL tasks.         Hand Dominance        Extremity/Trunk Assessment   Upper Extremity Assessment Upper Extremity Assessment: Defer to OT evaluation    Lower Extremity Assessment Lower Extremity Assessment: Generalized weakness;LLE deficits/detail LLE Deficits / Details: LLE laceration at shin    Cervical / Trunk Assessment Cervical / Trunk Assessment: Normal  Communication   Communication: No difficulties  Cognition Arousal/Alertness: Lethargic;Awake/alert Behavior During Therapy: WFL for tasks assessed/performed Overall Cognitive Status: No family/caregiver present to determine baseline cognitive functioning                                 General  Comments: Pt very sleepy initially. Increased alertness when sitting at EOB, however, pt still reports she feels "out of it".       General Comments      Exercises     Assessment/Plan    PT Assessment Patient needs continued PT services  PT Problem List Decreased strength;Decreased balance;Decreased mobility;Decreased knowledge of use of DME;Decreased knowledge of precautions;Pain;Decreased activity tolerance       PT Treatment Interventions DME instruction;Gait training;Functional mobility training;Therapeutic activities;Therapeutic exercise;Balance training;Patient/family education    PT  Goals (Current goals can be found in the Care Plan section)  Acute Rehab PT Goals Patient Stated Goal: to feel better PT Goal Formulation: With patient Time For Goal Achievement: 08/09/20 Potential to Achieve Goals: Fair    Frequency Min 3X/week   Barriers to discharge        Co-evaluation               AM-PAC PT "6 Clicks" Mobility  Outcome Measure Help needed turning from your back to your side while in a flat bed without using bedrails?: None Help needed moving from lying on your back to sitting on the side of a flat bed without using bedrails?: A Little Help needed moving to and from a bed to a chair (including a wheelchair)?: A Lot Help needed standing up from a chair using your arms (e.g., wheelchair or bedside chair)?: A Lot Help needed to walk in hospital room?: A Lot Help needed climbing 3-5 steps with a railing? : A Lot 6 Click Score: 15    End of Session   Activity Tolerance: Patient limited by fatigue;Treatment limited secondary to medical complications (Comment) (wooziness) Patient left: in bed;with call bell/phone within reach;with bed alarm set Nurse Communication: Mobility status PT Visit Diagnosis: Unsteadiness on feet (R26.81);Muscle weakness (generalized) (M62.81)    Time: 9449-6759 PT Time Calculation (min) (ACUTE ONLY): 13 min   Charges:   PT Evaluation $PT Eval Moderate Complexity: 1 Mod          Farley Ly, PT, DPT  Acute Rehabilitation Services  Pager: [phone] Office: [phone]   Lehman Prom 07/26/2020, 2:02 PM

## 2020-07-26 NOTE — TOC Initial Note (Signed)
Transition of Care [facility]) - Initial/Assessment Note    Patient Details  Name: [name] MRN: [id number] Date of Birth: June 27, 1966  Transition of [facility]) CM/SW Contact:    Curlene Labrum, RN Phone Number: 07/26/2020, 11:25 AM  Clinical Narrative:                 Case management met with the patient at the bedside regarding transitions of care to home.  The patient was very sleepy and difficult to arouse at this time.  The husband was in the room with the patient and was given medicare observation notice and discussed with him transition of care needs.  The patient's husband is disabled and is able and agreeable to perform daily dressing changes as home as ordered by the wound care RN.  The patient will need home health RN and PT set up prior to discharge and the family did not have a preference.  I called Ramond Marrow, RNCM with [facility] and she will check on availability for these services and return my call.  The patient will need dressing supplies for home.  The patient currently has home oxygen through [employer] at 4 liters/minute and a CPAP machine at home that she does not use according to the husband.  The patient's husband is aware that the patient will need for follow up with the pulmonologist in [employer] after hospitalization.  The patient has CPAP, home O2, RW, can and 3:1 at home.  Will continue to follow the patient for discharge needs and set up with home health services.  Expected Discharge Plan: [place] Barriers to Discharge: Continued Medical Work up   Patient Goals and CMS Choice Patient states their goals for this hospitalization and ongoing recovery are:: Patient plans to discharge home with husband when medically ready.  Waiting on PT evaluation. CMS Medicare.gov Compare Post Acute Care list provided to:: Patient Choice offered to / list presented to : Spouse  Expected Discharge Plan and Services Expected Discharge Plan: [place]   Discharge Planning Services: CM Consult Post Acute Care Choice: [place] arrangements for the past 2 months: [place]: RN, PT   Date HH Agency Contacted: 07/26/20 Time [place]: [id number] Representative spoke with at [place] with Ramond Marrow, RNCM at [facility] - waiting on return call  Prior Living Arrangements/Services Living arrangements for the past 2 months: [place] with:: Spouse Patient language and need for interpreter reviewed:: Yes Do you feel safe going back to the place where you live?: Yes      Need for Family Participation in Patient Care: Yes (Comment) Care giver support system in place?: Yes (comment) Current home services: DME ([place] home oxygen through [employer]) Criminal Activity/Legal Involvement Pertinent to Current Situation/Hospitalization: No - Comment as needed  Activities of Daily Living Home Assistive Devices/Equipment: Environmental consultant (specify type) (standard) ADL Screening (condition at time of admission) Patient's cognitive ability adequate to safely complete daily activities?: Yes Is the patient deaf or have difficulty hearing?: No Does the patient have difficulty seeing, even when wearing glasses/contacts?: No Does the patient have difficulty concentrating, remembering, or making decisions?: No Patient able to express need for assistance with ADLs?: Yes Does the patient have difficulty dressing or bathing?: Yes Independently performs ADLs?:  No Communication: Independent Dressing (OT): Needs assistance Is this a change from baseline?: Pre-admission baseline Grooming: Needs assistance Is this a change from baseline?: Pre-admission baseline Feeding: Independent Bathing: Needs assistance Is this a change from baseline?: Pre-admission baseline Toileting: Needs assistance Is this a change from baseline?: Pre-admission baseline In/Out  Bed: Independent Walks in Home: Needs assistance Is this a change from baseline?: Pre-admission baseline Does the patient have difficulty walking or climbing stairs?: Yes Weakness of Legs: Both Weakness of Arms/Hands: None  Permission Sought/Granted Permission sought to share information with : Case Manager Permission granted to share information with : Yes, Verbal Permission Granted     Permission granted to share info w AGENCY: [place] granted to share info w Relationship: spouse - Simona Huh     Emotional Assessment Appearance:: Appears stated age Attitude/Demeanor/Rapport: Engaged Affect (typically observed): Accepting Orientation: : Oriented to Self, Oriented to Place, Oriented to  Time, Oriented to Situation Alcohol / Substance Use: Not Applicable Psych Involvement: No (comment)  Admission diagnosis:  Cellulitis of left lower extremity [L03.116] Skin tear of left lower leg without complication, initial encounter [O83.254D] Patient Active Problem List   Diagnosis Date Noted  . Cellulitis of left lower extremity 07/25/2020  . Sleep apnea   . Wound dehiscence, surgical 04/01/2019  . Surgical wound dehiscence 04/01/2019  . Osteoarthritis of left hip 02/18/2019  . Chronic respiratory failure with hypoxia ([place]) 05/20/2018  . Chronic pain 05/20/2018  . Leukocytosis 05/20/2018  . Tobacco abuse 05/20/2018  . COPD with acute exacerbation ([place]) 05/20/2018  . Shortness of breath   . Essential hypertension   . GERD (gastroesophageal reflux disease)   . COPD (chronic obstructive pulmonary disease) ([place])   . Asthma   . Closed fracture of left proximal humerus   . Obesity due to excess calories   . Surgery, elective   . Back pain   . Fall   . Near syncope   . Acute pain of left shoulder   . Closed displaced oblique fracture of shaft of left humerus 07/20/2017  . Lumbar spinal stenosis 04/03/2012    Class: Diagnosis of  . COPD exacerbation ([place]) 05/31/2010   . ALLERGIC RHINITIS 05/09/2010  . DYSPNEA ON EXERTION 05/09/2010   PCP:  Chesley Noon, MD Pharmacy:   [facility], [place] - [address] [place] [address] [place] [address] Phone: [phone] Fax: [place], [place] [address] Phone: [phone] Fax: [phone]     Social Determinants of Health (SDOH) Interventions    Readmission Risk Interventions No flowsheet data found.

## 2020-07-26 NOTE — Care Management Obs Status (Signed)
MEDICARE OBSERVATION STATUS NOTIFICATION   Patient Details  Name: SHIORI ADCOX MRN: [id number] Date of Birth: Sep 02, 1966   Medicare Observation Status Notification Given:  Yes    Janae Bridgeman, RN 07/26/2020, 10:59 AM

## 2020-07-26 NOTE — Progress Notes (Signed)
0131 Patient arrived from ED via bed. Patient settled into room by charge nurse, Roni. Purewick established and 5L of O2 via nasal cannula applied.   0145 Admission assessment and questionnaire completed to the best of my knowledge. Will continue to monitor patient.

## 2020-07-26 NOTE — Progress Notes (Signed)
Pt refused cpap for tonight. She stated that she does not like how it feels. RT explained to pt why she needs cpap but pt still refused.

## 2020-07-26 NOTE — Plan of Care (Signed)

## 2020-07-26 NOTE — Consult Note (Signed)
WOC Nurse Consult Note: Reason for Consult: Left posterior heel and left anterior LE full thickness wounds. Photo taken by ED provider and uploaded to EMR is appreciated. Wound type: Trauma Pressure Injury POA: N/A Measurement: To be obtained today by Bedside RN and documented on Nursing Flow Sheet prior to placement of first dressing Wound bed:  Left posterior heel:  Recent full thickness wound of unknown etiology, but appearance is consistent with trauma, not pressure. Completed course of oral antibiotics for this wound. Red, moist wound bed with minimal serous exudate. Left anterior LE: Approximated full thickness skin tear sustained 11/16 with 75% of flap replaced and secured with steri-strip adhesive bandages. Visible wound bed is red, with small amount of serous exudate.  Skin flap with ecchymosis. Drainage (amount, consistency, odor) As noted above Periwound: intact, mild erythema and edema Dressing procedure/placement/frequency: I have provided Nursing with guidance for the topical care of these wounds to include cleanse with NS, pat dry, cover with antimicrobial nonadherent (xeroform) and a pressure redistribution heel boot. A sacral foam is requested to prevent pressure injury to that area.  WOC nursing team will not follow, but will remain available to this patient, the nursing and medical teams.  Please re-consult if needed. Thanks, Ladona Mow, MSN, RN, GNP, [place]  Pager# [phone]

## 2020-07-26 NOTE — Progress Notes (Signed)
Patient reports that she is feeling better.  Oxygen per N/C at 4 liters/min  Oxygen saturation is 92%

## 2020-07-26 NOTE — Progress Notes (Signed)
Took patient off Bi-PAP per her request. Placed nasal cannula back on at 6L.

## 2020-07-27 ENCOUNTER — Inpatient Hospital Stay (HOSPITAL_COMMUNITY): Payer: Medicare Other

## 2020-07-27 DIAGNOSIS — R0989 Other specified symptoms and signs involving the circulatory and respiratory systems: Secondary | ICD-10-CM

## 2020-07-27 DIAGNOSIS — L03116 Cellulitis of left lower limb: Secondary | ICD-10-CM | POA: Diagnosis not present

## 2020-07-27 LAB — CBC
HCT: 41.3 % (ref 36.0–46.0)
Hemoglobin: 12.5 g/dL (ref 12.0–15.0)
MCH: 26.6 pg (ref 26.0–34.0)
MCHC: 30.3 g/dL (ref 30.0–36.0)
MCV: 87.9 fL (ref 80.0–100.0)
Platelets: 322 10*3/uL (ref 150–400)
RBC: 4.7 MIL/uL (ref 3.87–5.11)
RDW: 15.9 % — ABNORMAL HIGH (ref 11.5–15.5)
WBC: 18.7 10*3/uL — ABNORMAL HIGH (ref 4.0–10.5)
nRBC: 0 % (ref 0.0–0.2)

## 2020-07-27 LAB — BASIC METABOLIC PANEL
Anion gap: 8 (ref 5–15)
BUN: 8 mg/dL (ref 6–20)
CO2: 37 mmol/L — ABNORMAL HIGH (ref 22–32)
Calcium: 8.9 mg/dL (ref 8.9–10.3)
Chloride: 94 mmol/L — ABNORMAL LOW (ref 98–111)
Creatinine, Ser: 0.75 mg/dL (ref 0.44–1.00)
GFR, Estimated: >60 mL/min (ref >60)
Glucose, Bld: 85 mg/dL (ref 70–99)
Potassium: 3.4 mmol/L — ABNORMAL LOW (ref 3.5–5.1)
Sodium: 139 mmol/L (ref 135–145)

## 2020-07-27 MED ORDER — FUROSEMIDE 20 MG PO TABS: 20.0000 mg | ORAL_TABLET | Freq: Every day | ORAL | 0 refills | Status: DC | PRN

## 2020-07-27 MED ORDER — IPRATROPIUM-ALBUTEROL 0.5-2.5 (3) MG/3ML IN SOLN: 3.0000 mL | Freq: Two times a day (BID) | RESPIRATORY_TRACT | Status: DC

## 2020-07-27 MED ORDER — GABAPENTIN 400 MG PO CAPS
400.0000 mg | ORAL_CAPSULE | Freq: Once | ORAL | Status: AC
  Administered 2020-07-27: 400 mg via ORAL
  Filled 2020-07-27: qty 1

## 2020-07-27 MED ORDER — CEPHALEXIN 500 MG PO CAPS: 500.0000 mg | ORAL_CAPSULE | Freq: Three times a day (TID) | ORAL | 0 refills | Status: AC

## 2020-07-27 MED ORDER — GABAPENTIN 300 MG PO CAPS
300.0000 mg | ORAL_CAPSULE | Freq: Three times a day (TID) | ORAL | 0 refills | Status: DC
Start: 2020-07-27 — End: 2020-09-10

## 2020-07-27 MED ORDER — FUROSEMIDE 10 MG/ML IJ SOLN: 20.0000 mg | Freq: Once | INTRAMUSCULAR | Status: DC

## 2020-07-27 NOTE — Discharge Summary (Signed)
[facility] Discharge Summary   Patient: [name] QBH:[id number]  PCP: [name], MD  Date of admission: 07/25/2020   Date of discharge: 07/27/2020     Discharge Diagnoses:  Principal Problem:   Cellulitis of left lower extremity Active Problems:   Essential hypertension   COPD (chronic obstructive pulmonary disease) (HCC)   Chronic respiratory failure with hypoxia (HCC)   Chronic pain   Tobacco abuse   Sleep apnea   Chronic respiratory failure due to obstructive sleep apnea (HCC)  Admitted From: home Disposition:  Home with home health  Recommendations for Outpatient Follow-up:  1. PCP: Follow-up with PCP in 1 week 2. Follow up LABS/TEST: None   Follow-up Information    [name], MD. Schedule an appointment as soon as possible for a visit.   Specialty: Family Medicine Why: Please follow up in the next 7-10 days for a hospital follow up. Contact information: [place] [address] [place] [address] [phone]        Randolm Idol, MD. Schedule an appointment as soon as possible for a visit.   Specialty: Pulmonary Disease Why: Please follow up in the next week with your pulmonary physican after your hospitalization. Contact information: [address] [place] [address] [place] [address] [phone]        [facility] Follow up.   Why: [facility] will continue to supply you with you home oxygen needs. Contact information: [address] [place] [address] [phone]        [facility], [facility] Follow up.   Specialty: Home Health Services Why: [facility] will be providing you with a RN and PT.  They will call you in the next 24-48 hours to providing therapy and nursing visit times.             Diet recommendation: Cardiac diet  Activity: The patient is advised to gradually reintroduce usual activities, as tolerated  Discharge Condition: stable  Code Status: Full code    History of present illness: As per the H and P dictated on admission, "[name] is a 54 y.o. female with medical history significant for COPD on chronic prednisone and azithromycin, chronic respiratory failure with hypoxia on 4 L supplemental O2 via [place] at all times, hypertension, hyperlipidemia, chronic pain on Norco and morphine, tobacco use, OSA nonadherent to CPAP who presents to the ED for evaluation of a laceration to her left lower leg.  Patient unable to provide history due to excessive somnolence and is otherwise obtained from EDP, chart review, and husband at bedside.  Patient has a known wound to her left heel for which she has completed a 7-day course of doxycycline by her PCP.  On 11/16 she suffered a laceration to her anterior lower leg after her rolling walker fell and hit her leg.  She had noticed redness proximal to the wound site.  She came to the ED for further evaluation.  Per ED physician, while in triage area patient had become excessively somnolent and hard to arouse.  Husband is at bedside and says that this has been ongoing for a long period of time.  He says she has sleep apnea but has not been using CPAP at night.  He says she wears 4 L supplemental O2 via [place] pretty much at all times.  She is on Norco and morphine for chronic pain.  He says she also uses Xanax for anxiety as needed, but this is  infrequent.  He says she frequently sleeps for long periods of time.  She has headaches in the morning when she awakens and is frequently fatigued during the day."  Hospital Course:  Summary of her active problems in the hospital is as following. COPD/chronic respiratory failure with hypoxiawith excessive somnolence: Concerned she has been however oxygenated chronically. She uses 4 L of home O2 via [place] at all times and is on chronic prednisone and azithromycin as an outpatient. Goal O2 should be 88-92%. I turned her O2 down to 1 L and has been maintaining O2 sats at 92% on  this. Likely also worsened from untreated OSA and oversedated from home pain medications.   Requested patient to remain compliant with CPAP titration also requested to follow-up with PCP and primary pulmonologist as soon as possible for further consideration of CPAP therapy.  Cellulitisand woundsof left lower extremity: Has acute laceration wound to anterior left lower shin and nonhealing left heel wound that has been reportedly present for 2 months. Has associated surrounding cellulitis atacute woundsite. -Treated with IV Ancef, switch to oral Keflex. ABI negative for any PVD. -Wound care consultation  Hypertension: Blood pressure improving. Lasix as needed.  Chronic pain: Continuing morphine on discharge. Recommend to reduce Norco dose to half.  Tobacco use: Continues to smoke at least half pack per day per patient's husband.  OSA: Obesity Nonadherent to CPAP at home. Likely contributing to excessive somnolence.  Tolerating BiPAP.  Change to CPAP.  Monitor. Body mass index is 37.02 kg/m.   Pain control  - [employer] Controlled Substance Reporting System database was reviewed. Recommended patient to reduce gabapentin dose from 400 mg 3 times daily to 300 g 3 times daily.  May even benefit from a daily dose instead of a 3 times daily dose. Patient is on morphine 60 mg twice daily recommend to reduce the dose of Norco to half at least. - Patient was instructed, not to drive, operate heavy machinery, perform activities at heights, swimming or participation in water activities or provide baby sitting services while on Pain, Sleep and Anxiety Medications; until her outpatient Physician has advised to do so again.  - Also recommended to not to take more than prescribed Pain, Sleep and Anxiety Medications.  Patient was seen by physical therapy, who recommended SNF, patient refused.  Home health was arranged. On the day of the discharge the patient's vitals were stable,  and no other acute medical condition were reported by patient. The patient was felt safe to be discharge at Home with Home health.  Consultants: none Procedures: BiPAP  Discharge Exam: General: Appear in no distress, no Rash; Oral Mucosa Clear, moist. no Abnormal Neck Mass Or lumps, Conjunctiva normal  Cardiovascular: S1 and S2 Present, no Murmur Respiratory: good respiratory effort, Bilateral Air entry present and CTA, no Crackles, no wheezes Abdomen: Bowel Sound present, Soft and no tenderness Extremities: bilateral  Pedal edema Neurology: alert and oriented to time, place, and person affect appropriate. no new focal deficit  Filed Weights   07/26/20 0147 07/27/20 0500  Weight: 97 kg 94.8 kg   Vitals:   07/27/20 0300 07/27/20 0802  BP: 126/70 (!) 154/90  Pulse: 97 (!) 102  Resp: 17 20  Temp: 98.1 F (36.7 C) 98 F (36.7 C)  SpO2: 99% 96%    DISCHARGE MEDICATION: Allergies as of 07/27/2020      Reactions   Amoxicillin-pot Clavulanate Itching   Ok with benadryl Did it involve swelling of the face/tongue/throat, SOB, or  low BP? No Did it involve sudden or severe rash/hives, skin peeling, or any reaction on the inside of your mouth or nose? No Did you need to seek medical attention at a hospital or doctor's office? No When did it last happen?6 months If all above answers are "NO", may proceed with cephalosporin use.   Erythromycin Rash   Ok with benadryl    Moxifloxacin Itching   Reaction to Avelox - ok with benadryl   Sulfamethoxazole-trimethoprim Itching   Ok with benadryl      Medication List    STOP taking these medications   losartan 100 MG tablet Commonly known as: COZAAR     TAKE these medications   albuterol (2.5 MG/3ML) 0.083% nebulizer solution Commonly known as: PROVENTIL Take 3 mLs (2.5 mg total) by nebulization every 4 (four) hours as needed for wheezing or shortness of breath.   aspirin 81 MG chewable tablet Chew 1 tablet (81 mg total) by  mouth 2 (two) times daily.   azithromycin 250 MG tablet Commonly known as: ZITHROMAX Take 250 mg by mouth daily.   Breztri Aerosphere 160-9-4.8 MCG/ACT Aero Generic drug: Budeson-Glycopyrrol-Formoterol Inhale 2 puffs into the lungs in the morning and at bedtime.   cephALEXin 500 MG capsule Commonly known as: KEFLEX Take 1 capsule (500 mg total) by mouth 3 (three) times daily for 3 days.   cetirizine 10 MG tablet Commonly known as: ZYRTEC Take 10 mg daily by mouth.   docusate sodium 100 MG capsule Commonly known as: COLACE Take 1 capsule (100 mg total) by mouth 2 (two) times daily.   DULoxetine 30 MG capsule Commonly known as: CYMBALTA Take 30 mg by mouth in the morning and at bedtime.   fluticasone 50 MCG/ACT nasal spray Commonly known as: FLONASE Place 1 spray into both nostrils daily as needed for allergies.   furosemide 20 MG tablet Commonly known as: Lasix Take 1 tablet (20 mg total) by mouth daily as needed for edema (weight gain of 3lbs in 1 day or 5 lbs in 2 days.).   gabapentin 300 MG capsule Commonly known as: NEURONTIN Take 1 capsule (300 mg total) by mouth 3 (three) times daily. What changed:   medication strength  how much to take   HYDROcodone-acetaminophen 10-325 MG tablet Commonly known as: NORCO Take 1 tablet by mouth every 6 (six) hours as needed (breakthrough pain).   magic mouthwash Soln Take 15 mLs by mouth 3 (three) times daily as needed for mouth pain.   montelukast 10 MG tablet Commonly known as: SINGULAIR Take 10 mg at bedtime by mouth.   morphine 30 MG 12 hr tablet Commonly known as: MS CONTIN Take 30 mg by mouth every 12 (twelve) hours.   nystatin 100000 UNIT/ML suspension Commonly known as: MYCOSTATIN Take 5 mLs by mouth daily as needed (thrush).   ondansetron 4 MG tablet Commonly known as: ZOFRAN Take 1 tablet (4 mg total) by mouth every 6 (six) hours as needed for nausea.   OXYGEN Inhale 4 L into the lungs continuous.     predniSONE 10 MG tablet Commonly known as: DELTASONE Take 10 mg by mouth daily with breakfast.   senna 8.6 MG Tabs tablet Commonly known as: SENOKOT Take 1 tablet (8.6 mg total) by mouth 2 (two) times daily.   Vitamin D (Ergocalciferol) 1.25 MG (50000 UNIT) Caps capsule Commonly known as: DRISDOL Take 50,000 Units by mouth once a week.            Discharge Care Instructions  (  From admission, onward)         Start     Ordered   07/27/20 0000  Discharge wound care:       Comments: Cleanse with NS, apt gently dry. Cover with folded piece of xeroform gauze, top with dry gauze, ABD pad over anterior LE and secure both wounds with Kerlix roll gauze/paper tape. Place foot into [employer] Boot. Change daily.   07/27/20 0955         Allergies  Allergen Reactions  . Amoxicillin-Pot Clavulanate Itching    Ok with benadryl Did it involve swelling of the face/tongue/throat, SOB, or low BP? No Did it involve sudden or severe rash/hives, skin peeling, or any reaction on the inside of your mouth or nose? No Did you need to seek medical attention at a hospital or doctor's office? No When did it last happen?6 months If all above answers are "NO", may proceed with cephalosporin use.   . Erythromycin Rash    Ok with benadryl   . Moxifloxacin Itching    Reaction to Avelox - ok with benadryl  . Sulfamethoxazole-Trimethoprim Itching    Ok with benadryl   Discharge Instructions    Diet - low sodium heart healthy   Complete by: As directed    Discharge wound care:   Complete by: As directed    Cleanse with NS, apt gently dry. Cover with folded piece of xeroform gauze, top with dry gauze, ABD pad over anterior LE and secure both wounds with Kerlix roll gauze/paper tape. Place foot into [employer] Boot. Change daily.   Increase activity slowly   Complete by: As directed       The results of significant diagnostics from this hospitalization (including imaging, microbiology,  ancillary and laboratory) are listed below for reference.    Significant Diagnostic Studies: DG Chest Port 1 View  Result Date: 07/25/2020 CLINICAL DATA:  Change in mental status EXAM: PORTABLE CHEST 1 VIEW COMPARISON:  None. FINDINGS: The heart size and mediastinal contours are mildly enlarged. Aortic knob calcifications are seen. Overall shallow degree of aeration with subsegmental atelectasis at the lung bases. No large airspace consolidation or pleural effusion. No acute osseous abnormality. Cervical fixation hardware and left intramedullary rod is seen within the humerus. IMPRESSION: No active disease. Electronically Signed   By: Jonna ClarkBindu  Avutu M.D.   On: 07/25/2020 22:43   VAS US ABI WITH/WO TBI  Result Date: 07/27/2020 LOWER EXTREMITY DOPPLER STUDY Indications: Physician unable to palpate pulses on right foot, cold foot. High Risk Factors: Hypertension, hyperlipidemia, current smoker. Other         COPD, on 4 liters of home Oxygen, cellulitis of left lower Factors:      extremity.  Limitations: Today's exam was limited due to Physician ordered only right ABI              secondary to laceration left ankle, bandages. Comparison Study: Prior ABI done 08/18/12 Performing Technologist: Sherren KernsKanady, Candace RVS  Examination Guidelines: A complete evaluation includes at minimum, Doppler waveform signals and systolic blood pressure reading at the level of bilateral brachial, anterior tibial, and posterior tibial arteries, when vessel segments are accessible. Bilateral testing is considered an integral part of a complete examination. Photoelectric Plethysmograph (PPG) waveforms and toe systolic pressure readings are included as required and additional duplex testing as needed. Limited examinations for reoccurring indications may be performed as noted.  ABI Findings: +---------+------------------+-----+-----------+--------+ Right    Rt Pressure (mmHg)IndexWaveform   Comment   +---------+------------------+-----+-----------+--------+ Brachial 170  multiphasic         +---------+------------------+-----+-----------+--------+ PTA      190               1.12 multiphasic         +---------+------------------+-----+-----------+--------+ DP       187               1.10 multiphasic         +---------+------------------+-----+-----------+--------+ Great Toe                       Absent              +---------+------------------+-----+-----------+--------+ +--------+------------------+-----+-----------+------------------+ Left    Lt Pressure (mmHg)IndexWaveform   Comment            +--------+------------------+-----+-----------+------------------+ Brachial170                    multiphasic                   +--------+------------------+-----+-----------+------------------+ PTA                                       bandage/laceration +--------+------------------+-----+-----------+------------------+ DP                                        bandage/laceration +--------+------------------+-----+-----------+------------------+ +-------+-----------+-----------+------------+------------+ ABI/TBIToday's ABIToday's TBIPrevious ABIPrevious TBI +-------+-----------+-----------+------------+------------+ Right  1.12       absent                              +-------+-----------+-----------+------------+------------+ Left   bandage/lac                                    +-------+-----------+-----------+------------+------------+ Right ABIs appear essentially unchanged compared to prior study on 08/18/2012.  Summary: Right: Resting right ankle-brachial index is within normal range. No evidence of significant right lower extremity arterial disease. The right toe-brachial index is abnormal. Left: Left ABI not done secondary to laceration/bandages.  *See table(s) above for measurements and observations.  Electronically signed by  Lemar Livings MD on 07/27/2020 at 3:20:07 PM.    Final     Microbiology: Recent Results (from the past 240 hour(s))  Respiratory Panel by RT PCR (Flu A&B, Covid) - Nasopharyngeal Swab     Status: None   Collection Time: 07/25/20 10:26 PM   Specimen: Nasopharyngeal Swab  Result Value Ref Range Status   SARS Coronavirus 2 by RT PCR NEGATIVE NEGATIVE Final    Comment: (NOTE) SARS-CoV-2 target nucleic acids are NOT DETECTED.  The SARS-CoV-2 RNA is generally detectable in upper respiratoy specimens during the acute phase of infection. The lowest concentration of SARS-CoV-2 viral copies this assay can detect is 131 copies/mL. A negative result does not preclude SARS-Cov-2 infection and should not be used as the sole basis for treatment or other patient management decisions. A negative result may occur with  improper specimen collection/handling, submission of specimen other than nasopharyngeal swab, presence of viral mutation(s) within the areas targeted by this assay, and inadequate number of viral copies (<131 copies/mL). A negative result must be combined with clinical observations, patient history, and epidemiological information. The expected result is Negative.  Fact  Sheet for Patients:  https://www.moore.com/  Fact Sheet for Healthcare Providers:  https://www.young.biz/  This test is no t yet approved or cleared by the [place] FDA and  has been authorized for detection and/or diagnosis of SARS-CoV-2 by FDA under an Emergency Use Authorization (EUA). This EUA will remain  in effect (meaning this test can be used) for the duration of the COVID-19 declaration under Section 564(b)(1) of the Act, 21 U.S.C. section 360bbb-3(b)(1), unless the authorization is terminated or revoked sooner.     Influenza A by PCR NEGATIVE NEGATIVE Final   Influenza B by PCR NEGATIVE NEGATIVE Final    Comment: (NOTE) The Xpert Xpress SARS-CoV-2/FLU/RSV  assay is intended as an aid in  the diagnosis of influenza from Nasopharyngeal swab specimens and  should not be used as a sole basis for treatment. Nasal washings and  aspirates are unacceptable for Xpert Xpress SARS-CoV-2/FLU/RSV  testing.  Fact Sheet for Patients: https://www.moore.com/  Fact Sheet for Healthcare Providers: https://www.young.biz/  This test is not yet approved or cleared by the [place] FDA and  has been authorized for detection and/or diagnosis of SARS-CoV-2 by  FDA under an Emergency Use Authorization (EUA). This EUA will remain  in effect (meaning this test can be used) for the duration of the  Covid-19 declaration under Section 564(b)(1) of the Act, 21  U.S.C. section 360bbb-3(b)(1), unless the authorization is  terminated or revoked. Performed at [facility], [address] [place], [place] [address]      Labs: CBC: Recent Labs  Lab 07/25/20 2205 07/25/20 2226 07/27/20 0417  WBC  --  17.9* 18.7*  NEUTROABS  --  13.6*  --   HGB 14.6 12.9 12.5  HCT 43.0 43.9 41.3  MCV  --  92.2 87.9  PLT  --  344 322   Basic Metabolic Panel: Recent Labs  Lab 07/25/20 2205 07/25/20 2226 07/27/20 0417  NA 132* 134* 139  K 4.3 4.3 3.4*  CL  --  90* 94*  CO2  --  34* 37*  GLUCOSE  --  112* 85  BUN  --  8 8  CREATININE  --  0.73 0.75  CALCIUM  --  8.9 8.9   Liver Function Tests: No results for input(s): AST, ALT, ALKPHOS, BILITOT, PROT, ALBUMIN in the last 168 hours. CBG: Recent Labs  Lab 07/25/20 2142  GLUCAP 120*    Time spent: 35 minutes  Signed:  Lynden Oxford  [facility] 07/27/2020 7:47 PM

## 2020-07-27 NOTE — Evaluation (Signed)
Occupational Therapy Evaluation Patient Details Name: [name] MRN: [id number] DOB: 1966-05-09 Today's Date: 07/27/2020    History of Present Illness Pt is a 54 y/o female admitted after sustaning cut on LLE. Pt with LLE cellulitis. PMH includes HTN, COPD on 4L of O2, tobacco use, OSA, and L THA.    Clinical Impression   PTA, pt lives with husband and reports mobilizing with occasional use of RW and light assistance with ADLs due to decreased cardiopulmonary tolerance. Pt presents now with deficits in strength, endurance, dynamic standing balance and pain in L LE secondary to wounds. Pt received on 4 L O2, SpO2 >88% with minimal activity but pt notably fatigued. Pt overall min guard for simple ADL transfers, unable to mobilize further due to pain and fatigue. Pt requires Min A for UB ADLs and Mod A for LB ADLs at this time. Extended time spent educating on possible energy conservation strategies to implement at home due to pt difficulty managing stairs and showering tasks. Pt reports desire to go home with max HH services, but OT also educated on benefits of short term rehab and SNF recommendation based on current functional abilities. Plan to progress mobility during ADLs within pt's tolerance to facilitate pt's goal of returning home.     Follow Up Recommendations  SNF;Supervision/Assistance - 24 hour;Other (comment) (HHOT & max HH services if pt continues to decline SNF)    Equipment Recommendations  Other (comment) Aeronautical engineer)    Recommendations for Other Services Other (comment) (HH aide if possible)     Precautions / Restrictions Precautions Precautions: Fall Restrictions Weight Bearing Restrictions: No      Mobility Bed Mobility Overal bed mobility: Needs Assistance Bed Mobility: Supine to Sit     Supine to sit: Supervision;HOB elevated     General bed mobility comments: Supervision with increased time/effort and use of bed rails    Transfers Overall transfer  level: Needs assistance Equipment used: None Transfers: Sit to/from [employer] Sit to Stand: Min guard Stand pivot transfers: Min guard       General transfer comment: min guard for safety and steadying. Pt reaching across to [facility] armrests for support    Balance Overall balance assessment: Needs assistance Sitting-balance support: No upper extremity supported;Feet supported Sitting balance-Leahy Scale: Fair     Standing balance support: Single extremity supported;During functional activity Standing balance-Leahy Scale: Fair Standing balance comment: fair static standing, reaching out for support during transfer                           ADL either performed or assessed with clinical judgement   ADL Overall ADL's : Needs assistance/impaired Eating/Feeding: Set up;Sitting   Grooming: Set up;Sitting;Wash/dry face Grooming Details (indicate cue type and reason): Setup sitting EOB Upper Body Bathing: Minimal assistance;Sitting   Lower Body Bathing: Moderate assistance;Sit to/from stand;Sitting/lateral leans   Upper Body Dressing : Set up;Sitting   Lower Body Dressing: Moderate assistance;Sit to/from stand;Sitting/lateral leans Lower Body Dressing Details (indicate cue type and reason): Max A to don socks, reports difficulty reaching due to hip replacement  Toilet Transfer: Min Barrister's clerk Details (indicate cue type and reason): min guard for stand pivot to [facility] without AD. Pt reports endurance too low to walk to bathroom  Toileting- Clothing Manipulation and Hygiene: Min guard;Sit to/from stand Toileting - Clothing Manipulation Details (indicate cue type and reason): min guard for hygiene after urination in standing  General ADL Comments: Pt limited by decreased cardiopulmonary tolerance and L LE pain with weightbearing      Vision Baseline Vision/History: Wears glasses Wears Glasses: Reading only Patient Visual  Report: No change from baseline Vision Assessment?: No apparent visual deficits     Perception     Praxis      Pertinent Vitals/Pain Pain Assessment: Faces Faces Pain Scale: Hurts a little bit Pain Location: LLE Pain Descriptors / Indicators: Grimacing;Guarding;Moaning Pain Intervention(s): Monitored during session     Hand Dominance Right   Extremity/Trunk Assessment Upper Extremity Assessment Upper Extremity Assessment: Generalized weakness   Lower Extremity Assessment Lower Extremity Assessment: Defer to PT evaluation   Cervical / Trunk Assessment Cervical / Trunk Assessment: Normal   Communication Communication Communication: No difficulties   Cognition Arousal/Alertness: Awake/alert Behavior During Therapy: WFL for tasks assessed/performed Overall Cognitive Status: No family/caregiver present to determine baseline cognitive functioning                                 General Comments: A&Ox4, improved alertness and appropriate conversation throughout. Some decreased awareness of deficits and managing at home    General Comments  Pt received on 4 L O2, SpO2 >88% with limited activity though pt notably fatigued after transfer to/from [facility]. Pt reports desire to go upstairs to shower at home but very fatigued during task. educated on placing chair on stair landing and/or at top of steps as well as use of shower chair in shower. Pt reports she has a rollator (educated that this is helpful for rest breaks) but that it cannot be adjusted and is too short for pt     Exercises     Shoulder Instructions      Home Living Family/patient expects to be discharged to:: Private residence Living Arrangements: Spouse/significant other Available Help at Discharge: Family;Available 24 hours/day Type of Home: House Home Access: Level entry     Home Layout: Two level Alternate Level Stairs-Number of Steps: flight   Bathroom Shower/Tub: Higher education careers adviser: Standard     Home Equipment: Environmental consultant - 2 wheels;Shower seat;Adaptive equipment;Hand held shower head;Walker - 4 wheels;Bedside commode;Wheelchair - Equities trader: Reacher Additional Comments: Pt reports difficulty going up to 2nd level, so sleeps on couch and does sponge bathing in half bath. Reports Rollator too short and unable to be adjusted       Prior Functioning/Environment Level of Independence: Needs assistance  Gait / Transfers Assistance Needed: Reports sometimes using RW at home.  ADL's / Homemaking Assistance Needed: Assistance for showers and LB dressing tasks as needed            OT Problem List: Decreased strength;Decreased activity tolerance;Impaired balance (sitting and/or standing);Decreased knowledge of use of DME or AE;Cardiopulmonary status limiting activity;Pain      OT Treatment/Interventions: Self-care/ADL training;Therapeutic exercise;Energy conservation;DME and/or AE instruction;Therapeutic activities;Patient/family education    OT Goals(Current goals can be found in the care plan section) Acute Rehab OT Goals Patient Stated Goal: be able to go up/down stairs at home, shower without excessive fatigue OT Goal Formulation: With patient Time For Goal Achievement: 08/10/20 Potential to Achieve Goals: Good ADL Goals Pt Will Perform Upper Body Bathing: with modified independence;sitting Pt Will Perform Lower Body Bathing: with set-up;sit to/from stand;sitting/lateral leans Pt Will Transfer to Toilet: with supervision;ambulating;bedside commode Additional ADL Goal #1: Pt to verbalize at least 3 energy conservation strategies to implement during ADLs  OT Frequency: Min 2X/week   Barriers to D/C:            Co-evaluation              AM-PAC OT "6 Clicks" Daily Activity     Outcome Measure Help from another person eating meals?: A Little Help from another person taking care of personal grooming?: A Little Help from another person  toileting, which includes using toliet, bedpan, or urinal?: A Little Help from another person bathing (including washing, rinsing, drying)?: A Lot Help from another person to put on and taking off regular upper body clothing?: A Little Help from another person to put on and taking off regular lower body clothing?: A Lot 6 Click Score: 16   End of Session Equipment Utilized During Treatment: Oxygen Nurse Communication: Mobility status;Other (comment) (NT)  Activity Tolerance: Patient limited by fatigue Patient left: in bed;with call bell/phone within reach;with bed alarm set;with nursing/sitter in room (NT checking vitals)  OT Visit Diagnosis: Unsteadiness on feet (R26.81);Other abnormalities of gait and mobility (R26.89);History of falling (Z91.81);Pain;Other (comment) (decreased cardiopulmonary tolerance) Pain - Right/Left: Left Pain - part of body: Leg                Time: 4665-9935 OT Time Calculation (min): 29 min Charges:  OT General Charges $OT Visit: 1 Visit OT Evaluation $OT Eval Moderate Complexity: 1 Mod OT Treatments $Self Care/Home Management : 8-22 mins  Lorre Munroe, OTR/L  Lorre Munroe 07/27/2020, 8:13 AM

## 2020-07-27 NOTE — Plan of Care (Signed)
Patient has been restarted on some of her home meds. Some haven't been ordered due to MD's concern regarding oversedation. Patient is on 4L nasal cannula with O2 sats WNL being above 88%. Patient refused to wear cpap tonight per RT. Continuous pulse ox and telemetry box in place as ordered. Will continue to monitor and continue current POC.

## 2020-07-27 NOTE — Progress Notes (Signed)
VASCULAR LAB    ABIs have been performed.  See CV proc for preliminary results.   Alencia Gordon, RVT 07/27/2020, 12:58 PM

## 2020-09-06 ENCOUNTER — Inpatient Hospital Stay (HOSPITAL_COMMUNITY): Payer: Medicare Other

## 2020-09-06 ENCOUNTER — Other Ambulatory Visit: Payer: Self-pay

## 2020-09-06 ENCOUNTER — Emergency Department (HOSPITAL_COMMUNITY): Payer: Medicare Other

## 2020-09-06 ENCOUNTER — Encounter (HOSPITAL_COMMUNITY): Payer: Self-pay | Admitting: *Deleted

## 2020-09-06 ENCOUNTER — Inpatient Hospital Stay (HOSPITAL_COMMUNITY)
Admission: EM | Admit: 2020-09-06 | Discharge: 2020-09-10 | DRG: 205 | Disposition: A | Discharge status: Home Health | Payer: Medicare Other | Attending: Student in an Organized Health Care Education/Training Program | Admitting: Student in an Organized Health Care Education/Training Program

## 2020-09-06 ENCOUNTER — Inpatient Hospital Stay (HOSPITAL_BASED_OUTPATIENT_CLINIC_OR_DEPARTMENT_OTHER): Payer: Medicare Other

## 2020-09-06 ENCOUNTER — Telehealth: Payer: Self-pay | Admitting: *Deleted

## 2020-09-06 DIAGNOSIS — G8929 Other chronic pain: Secondary | ICD-10-CM | POA: Diagnosis present

## 2020-09-06 DIAGNOSIS — Z6833 Body mass index (BMI) 33.0-33.9, adult: Secondary | ICD-10-CM

## 2020-09-06 DIAGNOSIS — I350 Nonrheumatic aortic (valve) stenosis: Secondary | ICD-10-CM

## 2020-09-06 DIAGNOSIS — R0603 Acute respiratory distress: Secondary | ICD-10-CM | POA: Diagnosis present

## 2020-09-06 DIAGNOSIS — M7989 Other specified soft tissue disorders: Secondary | ICD-10-CM

## 2020-09-06 DIAGNOSIS — L97829 Non-pressure chronic ulcer of other part of left lower leg with unspecified severity: Secondary | ICD-10-CM | POA: Diagnosis present

## 2020-09-06 DIAGNOSIS — Z79899 Other long term (current) drug therapy: Secondary | ICD-10-CM

## 2020-09-06 DIAGNOSIS — J441 Chronic obstructive pulmonary disease with (acute) exacerbation: Secondary | ICD-10-CM | POA: Diagnosis present

## 2020-09-06 DIAGNOSIS — Z880 Allergy status to penicillin: Secondary | ICD-10-CM

## 2020-09-06 DIAGNOSIS — Z96642 Presence of left artificial hip joint: Secondary | ICD-10-CM | POA: Diagnosis present

## 2020-09-06 DIAGNOSIS — F17210 Nicotine dependence, cigarettes, uncomplicated: Secondary | ICD-10-CM | POA: Diagnosis present

## 2020-09-06 DIAGNOSIS — Z9981 Dependence on supplemental oxygen: Secondary | ICD-10-CM

## 2020-09-06 DIAGNOSIS — Z881 Allergy status to other antibiotic agents status: Secondary | ICD-10-CM | POA: Diagnosis not present

## 2020-09-06 DIAGNOSIS — I872 Venous insufficiency (chronic) (peripheral): Secondary | ICD-10-CM | POA: Diagnosis present

## 2020-09-06 DIAGNOSIS — Z20822 Contact with and (suspected) exposure to covid-19: Secondary | ICD-10-CM | POA: Diagnosis present

## 2020-09-06 DIAGNOSIS — E662 Morbid (severe) obesity with alveolar hypoventilation: Principal | ICD-10-CM | POA: Diagnosis present

## 2020-09-06 DIAGNOSIS — J9622 Acute and chronic respiratory failure with hypercapnia: Secondary | ICD-10-CM | POA: Diagnosis present

## 2020-09-06 DIAGNOSIS — Z79891 Long term (current) use of opiate analgesic: Secondary | ICD-10-CM | POA: Diagnosis not present

## 2020-09-06 DIAGNOSIS — Z825 Family history of asthma and other chronic lower respiratory diseases: Secondary | ICD-10-CM

## 2020-09-06 DIAGNOSIS — Z882 Allergy status to sulfonamides status: Secondary | ICD-10-CM

## 2020-09-06 DIAGNOSIS — I110 Hypertensive heart disease with heart failure: Secondary | ICD-10-CM | POA: Diagnosis present

## 2020-09-06 DIAGNOSIS — R7989 Other specified abnormal findings of blood chemistry: Secondary | ICD-10-CM | POA: Diagnosis present

## 2020-09-06 DIAGNOSIS — Z7952 Long term (current) use of systemic steroids: Secondary | ICD-10-CM

## 2020-09-06 DIAGNOSIS — I5033 Acute on chronic diastolic (congestive) heart failure: Secondary | ICD-10-CM | POA: Diagnosis present

## 2020-09-06 DIAGNOSIS — K219 Gastro-esophageal reflux disease without esophagitis: Secondary | ICD-10-CM | POA: Diagnosis present

## 2020-09-06 DIAGNOSIS — Z801 Family history of malignant neoplasm of trachea, bronchus and lung: Secondary | ICD-10-CM

## 2020-09-06 DIAGNOSIS — F419 Anxiety disorder, unspecified: Secondary | ICD-10-CM | POA: Diagnosis present

## 2020-09-06 DIAGNOSIS — I878 Other specified disorders of veins: Secondary | ICD-10-CM | POA: Diagnosis present

## 2020-09-06 DIAGNOSIS — Z7951 Long term (current) use of inhaled steroids: Secondary | ICD-10-CM

## 2020-09-06 DIAGNOSIS — Z792 Long term (current) use of antibiotics: Secondary | ICD-10-CM | POA: Diagnosis not present

## 2020-09-06 DIAGNOSIS — E6609 Other obesity due to excess calories: Secondary | ICD-10-CM | POA: Diagnosis present

## 2020-09-06 DIAGNOSIS — E785 Hyperlipidemia, unspecified: Secondary | ICD-10-CM | POA: Diagnosis present

## 2020-09-06 DIAGNOSIS — Z8261 Family history of arthritis: Secondary | ICD-10-CM

## 2020-09-06 LAB — ECHOCARDIOGRAM COMPLETE
AR max vel: 0.81 cm2
AV Area VTI: 0.73 cm2
AV Area mean vel: 0.71 cm2
AV Mean grad: 46 mmHg
AV Peak grad: 73.6 mmHg
Ao pk vel: 4.29 m/s
Area-P 1/2: 2.82 cm2
Weight: 3040 oz

## 2020-09-06 LAB — I-STAT ARTERIAL BLOOD GAS, ED
Acid-Base Excess: 11 mmol/L — ABNORMAL HIGH (ref 0.0–2.0)
Bicarbonate: 35.9 mmol/L — ABNORMAL HIGH (ref 20.0–28.0)
Calcium, Ion: 1.09 mmol/L — ABNORMAL LOW (ref 1.15–1.40)
HCT: 39 % (ref 36.0–46.0)
Hemoglobin: 13.3 g/dL (ref 12.0–15.0)
O2 Saturation: 100 %
Patient temperature: 98.2
Potassium: 3.6 mmol/L (ref 3.5–5.1)
Sodium: 132 mmol/L — ABNORMAL LOW (ref 135–145)
TCO2: 37 mmol/L — ABNORMAL HIGH (ref 22–32)
pCO2 arterial: 44.3 mmHg (ref 32.0–48.0)
pH, Arterial: 7.516 — ABNORMAL HIGH (ref 7.350–7.450)
pO2, Arterial: 152 mmHg — ABNORMAL HIGH (ref 83.0–108.0)

## 2020-09-06 LAB — CREATININE, SERUM
Creatinine, Ser: 0.72 mg/dL (ref 0.44–1.00)
GFR, Estimated: >60 mL/min (ref >60)

## 2020-09-06 LAB — COMPREHENSIVE METABOLIC PANEL
ALT: 30 U/L (ref 0–44)
AST: 23 U/L (ref 15–41)
Albumin: 3.5 g/dL (ref 3.5–5.0)
Alkaline Phosphatase: 56 U/L (ref 38–126)
Anion gap: 11 (ref 5–15)
BUN: 8 mg/dL (ref 6–20)
CO2: 35 mmol/L — ABNORMAL HIGH (ref 22–32)
Calcium: 8.6 mg/dL — ABNORMAL LOW (ref 8.9–10.3)
Chloride: 94 mmol/L — ABNORMAL LOW (ref 98–111)
Creatinine, Ser: 0.65 mg/dL (ref 0.44–1.00)
GFR, Estimated: >60 mL/min (ref >60)
Glucose, Bld: 133 mg/dL — ABNORMAL HIGH (ref 70–99)
Potassium: 4 mmol/L (ref 3.5–5.1)
Sodium: 140 mmol/L (ref 135–145)
Total Bilirubin: 0.4 mg/dL (ref 0.3–1.2)
Total Protein: 6.4 g/dL — ABNORMAL LOW (ref 6.5–8.1)

## 2020-09-06 LAB — HIV ANTIBODY (ROUTINE TESTING W REFLEX): HIV Screen 4th Generation wRfx: NONREACTIVE

## 2020-09-06 LAB — CBC WITH DIFFERENTIAL/PLATELET
Abs Immature Granulocytes: 0.36 10*3/uL — ABNORMAL HIGH (ref 0.00–0.07)
Basophils Absolute: 0 10*3/uL (ref 0.0–0.1)
Basophils Relative: 0 %
Eosinophils Absolute: 0 10*3/uL (ref 0.0–0.5)
Eosinophils Relative: 0 %
HCT: 43.7 % (ref 36.0–46.0)
Hemoglobin: 12.5 g/dL (ref 12.0–15.0)
Immature Granulocytes: 2 %
Lymphocytes Relative: 3 %
Lymphs Abs: 0.6 10*3/uL — ABNORMAL LOW (ref 0.7–4.0)
MCH: 26.2 pg (ref 26.0–34.0)
MCHC: 28.6 g/dL — ABNORMAL LOW (ref 30.0–36.0)
MCV: 91.6 fL (ref 80.0–100.0)
Monocytes Absolute: 1.2 10*3/uL — ABNORMAL HIGH (ref 0.1–1.0)
Monocytes Relative: 6 %
Neutro Abs: 16.7 10*3/uL — ABNORMAL HIGH (ref 1.7–7.7)
Neutrophils Relative %: 89 %
Platelets: 246 10*3/uL (ref 150–400)
RBC: 4.77 MIL/uL (ref 3.87–5.11)
RDW: 15.5 % (ref 11.5–15.5)
WBC: 18.9 10*3/uL — ABNORMAL HIGH (ref 4.0–10.5)
nRBC: 0 % (ref 0.0–0.2)

## 2020-09-06 LAB — LIPID PANEL
Cholesterol: 193 mg/dL (ref 0–200)
HDL: 63 mg/dL (ref >40)
LDL Cholesterol: 113 mg/dL — ABNORMAL HIGH (ref 0–99)
Total CHOL/HDL Ratio: 3.1 RATIO
Triglycerides: 84 mg/dL (ref <150)
VLDL: 17 mg/dL (ref 0–40)

## 2020-09-06 LAB — TROPONIN I (HIGH SENSITIVITY)
Troponin I (High Sensitivity): 18 ng/L — ABNORMAL HIGH (ref <18)
Troponin I (High Sensitivity): 21 ng/L — ABNORMAL HIGH (ref <18)
Troponin I (High Sensitivity): 22 ng/L — ABNORMAL HIGH (ref <18)
Troponin I (High Sensitivity): 24 ng/L — ABNORMAL HIGH (ref <18)

## 2020-09-06 LAB — CBC
HCT: 41 % (ref 36.0–46.0)
Hemoglobin: 11.7 g/dL — ABNORMAL LOW (ref 12.0–15.0)
MCH: 26.4 pg (ref 26.0–34.0)
MCHC: 28.5 g/dL — ABNORMAL LOW (ref 30.0–36.0)
MCV: 92.3 fL (ref 80.0–100.0)
Platelets: 233 10*3/uL (ref 150–400)
RBC: 4.44 MIL/uL (ref 3.87–5.11)
RDW: 15.6 % — ABNORMAL HIGH (ref 11.5–15.5)
WBC: 19.2 10*3/uL — ABNORMAL HIGH (ref 4.0–10.5)
nRBC: 0 % (ref 0.0–0.2)

## 2020-09-06 LAB — BRAIN NATRIURETIC PEPTIDE: B Natriuretic Peptide: 203.6 pg/mL — ABNORMAL HIGH (ref 0.0–100.0)

## 2020-09-06 LAB — PROCALCITONIN: Procalcitonin: <0.1 ng/mL

## 2020-09-06 LAB — RESP PANEL BY RT-PCR (FLU A&B, COVID) ARPGX2
Influenza A by PCR: NEGATIVE
Influenza B by PCR: NEGATIVE
SARS Coronavirus 2 by RT PCR: NEGATIVE

## 2020-09-06 MED ORDER — ENOXAPARIN SODIUM 40 MG/0.4ML ~~LOC~~ SOLN
40.0000 mg | SUBCUTANEOUS | Status: DC
  Administered 2020-09-07 – 2020-09-10 (×4): 40 mg via SUBCUTANEOUS
  Filled 2020-09-06 (×4): qty 0.4

## 2020-09-06 MED ORDER — MONTELUKAST SODIUM 10 MG PO TABS
10.0000 mg | ORAL_TABLET | Freq: Every day | ORAL | Status: DC
  Administered 2020-09-06 – 2020-09-09 (×4): 10 mg via ORAL
  Filled 2020-09-06 (×6): qty 1

## 2020-09-06 MED ORDER — ALBUTEROL SULFATE (2.5 MG/3ML) 0.083% IN NEBU
2.5000 mg | INHALATION_SOLUTION | RESPIRATORY_TRACT | Status: DC | PRN
  Administered 2020-09-08 – 2020-09-09 (×2): 2.5 mg via RESPIRATORY_TRACT
  Filled 2020-09-06 (×3): qty 3

## 2020-09-06 MED ORDER — FLUTICASONE PROPIONATE 50 MCG/ACT NA SUSP: 1.0000 | Freq: Every day | NASAL | Status: DC | PRN

## 2020-09-06 MED ORDER — PREDNISONE 20 MG PO TABS: 40.0000 mg | ORAL_TABLET | Freq: Every day | ORAL | Status: DC

## 2020-09-06 MED ORDER — PERFLUTREN LIPID MICROSPHERE
1.0000 mL | INTRAVENOUS | Status: AC | PRN
  Administered 2020-09-06: 4 mL via INTRAVENOUS
  Filled 2020-09-06: qty 10

## 2020-09-06 MED ORDER — SODIUM CHLORIDE 0.9% FLUSH
3.0000 mL | Freq: Two times a day (BID) | INTRAVENOUS | Status: DC
  Administered 2020-09-06 – 2020-09-10 (×8): 3 mL via INTRAVENOUS

## 2020-09-06 MED ORDER — VITAMIN D (ERGOCALCIFEROL) 1.25 MG (50000 UNIT) PO CAPS: 50000.0000 [IU] | ORAL_CAPSULE | ORAL | Status: DC

## 2020-09-06 MED ORDER — FUROSEMIDE 10 MG/ML IJ SOLN
20.0000 mg | Freq: Once | INTRAMUSCULAR | Status: AC
  Administered 2020-09-06: 11:00:00 20 mg via INTRAVENOUS
  Filled 2020-09-06: qty 2

## 2020-09-06 MED ORDER — DIAZEPAM 5 MG/ML IJ SOLN
2.5000 mg | Freq: Once | INTRAMUSCULAR | Status: AC
  Administered 2020-09-06: 06:00:00 2.5 mg via INTRAVENOUS
  Filled 2020-09-06: qty 2

## 2020-09-06 MED ORDER — IPRATROPIUM-ALBUTEROL 0.5-2.5 (3) MG/3ML IN SOLN
3.0000 mL | Freq: Four times a day (QID) | RESPIRATORY_TRACT | Status: DC
  Administered 2020-09-06 – 2020-09-10 (×15): 3 mL via RESPIRATORY_TRACT
  Filled 2020-09-06 (×16): qty 3

## 2020-09-06 MED ORDER — GABAPENTIN 300 MG PO CAPS
300.0000 mg | ORAL_CAPSULE | Freq: Three times a day (TID) | ORAL | Status: DC
  Administered 2020-09-06 – 2020-09-10 (×10): 300 mg via ORAL
  Filled 2020-09-06 (×12): qty 1

## 2020-09-06 MED ORDER — ASPIRIN 81 MG PO CHEW
81.0000 mg | CHEWABLE_TABLET | Freq: Every day | ORAL | Status: DC
  Administered 2020-09-06 – 2020-09-10 (×5): 81 mg via ORAL
  Filled 2020-09-06 (×5): qty 1

## 2020-09-06 MED ORDER — MORPHINE SULFATE ER 15 MG PO TBCR: 15.0000 mg | EXTENDED_RELEASE_TABLET | Freq: Two times a day (BID) | ORAL | Status: DC

## 2020-09-06 MED ORDER — FUROSEMIDE 20 MG PO TABS
20.0000 mg | ORAL_TABLET | Freq: Every day | ORAL | Status: DC
Start: 2020-09-07 — End: 2020-09-10
  Administered 2020-09-08 – 2020-09-10 (×3): 20 mg via ORAL
  Filled 2020-09-06 (×4): qty 1

## 2020-09-06 MED ORDER — BUDESONIDE 0.5 MG/2ML IN SUSP
0.5000 mg | Freq: Two times a day (BID) | RESPIRATORY_TRACT | Status: DC
  Administered 2020-09-06 – 2020-09-10 (×8): 0.5 mg via RESPIRATORY_TRACT
  Filled 2020-09-06 (×8): qty 2

## 2020-09-06 MED ORDER — DULOXETINE HCL 30 MG PO CPEP
30.0000 mg | ORAL_CAPSULE | Freq: Every day | ORAL | Status: DC
  Administered 2020-09-08 – 2020-09-10 (×3): 30 mg via ORAL
  Filled 2020-09-06 (×4): qty 1

## 2020-09-06 MED ORDER — FUROSEMIDE 20 MG PO TABS: 20.0000 mg | ORAL_TABLET | Freq: Every day | ORAL | Status: DC

## 2020-09-06 MED ORDER — ALBUTEROL (5 MG/ML) CONTINUOUS INHALATION SOLN
10.0000 mg/h | INHALATION_SOLUTION | RESPIRATORY_TRACT | Status: DC
  Administered 2020-09-06: 06:00:00 10 mg/h via RESPIRATORY_TRACT
  Filled 2020-09-06 (×2): qty 20

## 2020-09-06 MED ORDER — METHYLPREDNISOLONE SODIUM SUCC 40 MG IJ SOLR
40.0000 mg | Freq: Two times a day (BID) | INTRAMUSCULAR | Status: AC
  Administered 2020-09-06 – 2020-09-07 (×2): 40 mg via INTRAVENOUS
  Filled 2020-09-06 (×2): qty 1

## 2020-09-06 MED ORDER — ALBUTEROL SULFATE (2.5 MG/3ML) 0.083% IN NEBU: 2.5000 mg | INHALATION_SOLUTION | RESPIRATORY_TRACT | Status: DC | PRN

## 2020-09-06 MED ORDER — BUDESON-GLYCOPYRROL-FORMOTEROL 160-9-4.8 MCG/ACT IN AERO: 2.0000 | INHALATION_SPRAY | Freq: Two times a day (BID) | RESPIRATORY_TRACT | Status: DC

## 2020-09-06 MED ORDER — AZITHROMYCIN 250 MG PO TABS
250.0000 mg | ORAL_TABLET | Freq: Every day | ORAL | Status: DC
  Administered 2020-09-06 – 2020-09-10 (×4): 250 mg via ORAL
  Filled 2020-09-06 (×5): qty 1

## 2020-09-06 NOTE — ED Triage Notes (Signed)
Pt from home with EMS for SOB for a few days, worsened in the past few hours. At baseline, pt on 4L home oxygen. Initial lung sound absent, hx of copd, chf. EMS attempted cpap, pt unable to tolerate. Given 2 duonebs, 125mg  solumedrol, 2 g Mag. 20g IV to L hand. Wheezing noted on arrival. Pt c/o being hot, able to answer questions appropriately in short sentences. Bipap placed. Redness noted to LLE, pt reported she has recently finished antibiotics for it

## 2020-09-06 NOTE — ED Notes (Signed)
Toes bilaterally noted to be purple and cold to the touch pt states this is normal for her.  She states they also have decreased sensation  Pt is lethargic and falls asleep during questioning, pt able to swallow pills after multiple attempts to keep her awake

## 2020-09-06 NOTE — ED Notes (Signed)
Lunch Tray Ordered @ 1038.  

## 2020-09-06 NOTE — Progress Notes (Signed)
PT Cancellation Note  Patient Details Name: [name] MRN: [id number] DOB: 1966/06/20   Cancelled Treatment:    Reason Eval/Treat Not Completed: Medical issues which prohibited therapy;Patient not medically ready. Per RN pt on bipap and not alert or oriented enough to participate. Requesting to hold till tomorrow.  Ginette Otto, DPT Acute Rehabilitation Services [phone]   Lucretia Field 09/06/2020, 2:36 PM

## 2020-09-06 NOTE — Progress Notes (Signed)
Left lower extremity venous duplex has been completed. Preliminary results can be found in CV Proc through chart review.  Results were given to Dr. Maryla Morrow.  09/06/20 3:43 PM Olen Cordial RVT

## 2020-09-06 NOTE — Progress Notes (Signed)
Pt. Arrived to floor from ED with labored breathing, respiratory therapy was called to assess pt., pt. Was advised the ned for bipap, pt. Refused at first then after a through educating the reason for bipap, pt. Agreed and was placed on bipap. Pt.'s toes were noted to be blue and cold to the touch, MD notified.

## 2020-09-06 NOTE — Hospital Course (Addendum)
[name] is a 54 y.o. female with PMHx of COPD on chronic prednisone, azithromycin and on 4 L O2 home, HTN, HLD, chronic pain on Norco and morphine, tobacco use, OSA nonadherent to CPAP, who presented with worsening of shortness of breath, weakness and found to have COPD exacerbation requiring BiPAP.      Acute on chronic respiratory failure Obesity hypoventilation syndrome COPD (GOLD C) exacerbation  Patient admitted for hypercarbic respiratory failure in the setting of COPD exacerbation, requiring Bipap. Her chronic steroid dose was increased to 40 mg prednisone daily and treated with 250mg  azithromycin. She was also treated with Singulair and pulmicort. She was able to wean down to her baseline 4L and discharged in stable condition .  -Continue prednisone 40 mg daily, day 4/7. Complete 7 day course of 40 mg prednisone followed by 5 day course of 20 mg prednisone then continuing home 10 mg.  -Continue home azithromycin 250 mg daily -Continue home Singulair, Pulmicort, nebulizer -Continue supplemental oxygen with nasal cannula to keep oxygen saturations near 90% -CPAP at night - PT recommend HH PT   CHF Elevated Trop Patient mildly fluid overloaded on exam with 2+ lower extremity pitting edema on exam. Repeat ECHO shows EF 65-70%  But severe aortic valve stenosis. Patient endorse dyspnea on exertion and occasional dizziness. Denied CP.  --Continue Lasix 20 mg daily -- Work-up for stenosis in outpatient setting.   Left lower extremity swelling No DVT on doppler by Vascular. Arterial vasculature reassuring. Faint dorsal pedalis pulses. Likely 2/2 to HF. Will continue diuresing and reassess. ASCVD score of 6.6%. Plan to start on moderate intensity statin as respiratory status improve. --Continue ASA --Needs moderate intensity statin --Continue compression wraps on the right leg   Chronic pain: On home Norco 10-325 mg q6h prn and MS contin 30 mg twice daily. Also on gabpentin and  Cymbalta at home.  -Continue Norco 10-3 25 every 6 hours, MS Contin 30 mg every 12 hours, gabapentin 300 mg 3 times daily.   Leukocytosis Resolving. WBC down to 14.8 from 19.2. Likely 2/2 her chronic steroid use. She has remained afebrile. --Daily CBC

## 2020-09-06 NOTE — Progress Notes (Signed)
Pt ripping off CAT nebulizer mask, HFNC cannula, bipap mask stating she cannot breathe. RT attempting to reassure her that if she would take some deep breaths and try and relax and allow this RT to assist her getting the right O2 device on her it would help her breathe. Pt getting very agitated with this RT and pulling HFNC off yelling she does it everyday and "what do I know" and to "leave her alone". Pt's husband at bedside. RT explained to him that she has taken off every O2 device and if she continues like this she may end up having to get intubated to allow [place] to help her breathe if she cannot relax and catch her breath, Pt remained on 8L HFNC when this RT left the room, unsure what her baseline is as she was unwilling to tell this RT. VS WNL. Pt appearing very anxious

## 2020-09-06 NOTE — Progress Notes (Signed)
  Echocardiogram 2D Echocardiogram has been performed.  Gerda Diss 09/06/2020, 5:51 PM

## 2020-09-06 NOTE — ED Provider Notes (Signed)
[name] EMERGENCY DEPARTMENT Provider Note  CSN: [id number] Arrival date & time: 09/06/20 0177  Chief Complaint(s) Respiratory Distress   ED Triage Notes Pt from home with EMS for SOB for a few days, worsened in the past few hours. At baseline, pt on 4L home oxygen. Initial lung sound absent, hx of copd, chf. EMS attempted cpap, pt unable to tolerate. Given 2 duonebs, 125mg  solumedrol, 2 g Mag. 20g IV to L hand. Wheezing noted on arrival. Pt c/o being hot, able to answer questions appropriately in short sentences. Bipap placed. Redness noted to LLE, pt reported she has recently finished antibiotics for it    HPI [name] is a 54 y.o. female with a history of COPD on 4 L nasal cannula at home. Here for several days of gradually worsening shortness of breath that became severe earlier this evening.  Patient denies any fevers.  Reports productive cough of clear sputum. Shortness of breath worse with coughing and exertion. No chest pain. No nausea or vomiting. No abdominal pain. Patient has bilateral lower extremity edema which she reports has improved. Reports that she was on Lasix and completed the course 3 days ago. No history of CHF last echo pain November 2018 showed a EF of 60 to 65% with normal diastolic function. No pericardial effusion.   Brought in by EMS - treatment above.  HPI  Past Medical History Past Medical History:  Diagnosis Date  . Allergic rhinitis   . Asthma   . COPD (chronic obstructive pulmonary disease) (HCC)    on home O2  . GERD (gastroesophageal reflux disease)   . Hypertension   . Open wound of left hip    spouse changes dressing q day  . Shortness of breath   . Sleep apnea    NO DEVICE IN USE, USES SUPPLEMENTAL O2 IN PLACE OF DEVICE   . Supplemental oxygen dependent    4L CONTINUOUS    Patient Active Problem List   Diagnosis Date Noted  . Chronic respiratory failure due to obstructive sleep apnea (HCC) 07/26/2020  .  Cellulitis of left lower extremity 07/25/2020  . Sleep apnea   . Wound dehiscence, surgical 04/01/2019  . Surgical wound dehiscence 04/01/2019  . Osteoarthritis of left hip 02/18/2019  . Chronic respiratory failure with hypoxia (HCC) 05/20/2018  . Chronic pain 05/20/2018  . Leukocytosis 05/20/2018  . Tobacco abuse 05/20/2018  . COPD with acute exacerbation (HCC) 05/20/2018  . Shortness of breath   . Essential hypertension   . GERD (gastroesophageal reflux disease)   . COPD (chronic obstructive pulmonary disease) (HCC)   . Asthma   . Closed fracture of left proximal humerus   . Obesity due to excess calories   . Surgery, elective   . Back pain   . Fall   . Near syncope   . Acute pain of left shoulder   . Closed displaced oblique fracture of shaft of left humerus 07/20/2017  . Lumbar spinal stenosis 04/03/2012    Class: Diagnosis of  . COPD exacerbation (HCC) 05/31/2010  . ALLERGIC RHINITIS 05/09/2010  . DYSPNEA ON EXERTION 05/09/2010   Home Medication(s) Prior to Admission medications   Medication Sig Start Date End Date Taking? Authorizing Provider  albuterol (PROVENTIL) (2.5 MG/3ML) 0.083% nebulizer solution Take 3 mLs (2.5 mg total) by nebulization every 4 (four) hours as needed for wheezing or shortness of breath. 05/23/18   05/25/18, MD  aspirin 81 MG chewable tablet Chew 1 tablet (81 mg  total) by mouth 2 (two) times daily. Patient not taking: Reported on 07/26/2020 02/19/19   Samson Frederic, MD  azithromycin (ZITHROMAX) 250 MG tablet Take 250 mg by mouth daily.    [provider]  Budeson-Glycopyrrol-Formoterol (BREZTRI AEROSPHERE) 160-9-4.8 MCG/ACT AERO Inhale 2 puffs into the lungs in the morning and at bedtime.    [provider]  cetirizine (ZYRTEC) 10 MG tablet Take 10 mg daily by mouth.    [provider]  docusate sodium (COLACE) 100 MG capsule Take 1 capsule (100 mg total) by mouth 2 (two) times daily. Patient not taking: Reported  on 07/26/2020 02/19/19   Samson Frederic, MD  DULoxetine (CYMBALTA) 30 MG capsule Take 30 mg by mouth in the morning and at bedtime. 07/12/20   [provider]  fluticasone (FLONASE) 50 MCG/ACT nasal spray Place 1 spray into both nostrils daily as needed for allergies.     [provider]  furosemide (LASIX) 20 MG tablet Take 1 tablet (20 mg total) by mouth daily as needed for edema (weight gain of 3lbs in 1 day or 5 lbs in 2 days.). 07/27/20   Rolly Salter, MD  gabapentin (NEURONTIN) 300 MG capsule Take 1 capsule (300 mg total) by mouth 3 (three) times daily. 07/27/20   Rolly Salter, MD  HYDROcodone-acetaminophen (NORCO) 10-325 MG tablet Take 1 tablet by mouth every 6 (six) hours as needed (breakthrough pain). 04/02/19   Swinteck, Arlys John, MD  magic mouthwash SOLN Take 15 mLs by mouth 3 (three) times daily as needed for mouth pain. 02/20/19   Shuford, French Ana, PA-C  montelukast (SINGULAIR) 10 MG tablet Take 10 mg at bedtime by mouth.    [provider]  morphine (MS CONTIN) 30 MG 12 hr tablet Take 30 mg by mouth every 12 (twelve) hours.    [provider]  nystatin (MYCOSTATIN) 100000 UNIT/ML suspension Take 5 mLs by mouth daily as needed (thrush).    [provider]  ondansetron (ZOFRAN) 4 MG tablet Take 1 tablet (4 mg total) by mouth every 6 (six) hours as needed for nausea. Patient not taking: Reported on 03/30/2019 02/19/19   Samson Frederic, MD  OXYGEN Inhale 4 L into the lungs continuous.     [provider]  predniSONE (DELTASONE) 10 MG tablet Take 10 mg by mouth daily with breakfast.    [provider]  senna (SENOKOT) 8.6 MG TABS tablet Take 1 tablet (8.6 mg total) by mouth 2 (two) times daily. Patient not taking: Reported on 03/30/2019 02/19/19   Samson Frederic, MD  Vitamin D, Ergocalciferol, (DRISDOL) 1.25 MG (50000 UNIT) CAPS capsule Take 50,000 Units by mouth once a week. 06/15/20   [provider]                                                                                                                                     Past Surgical History Past Surgical  History:  Procedure Laterality Date  . COLONOSCOPY    . HUMERUS IM NAIL Left 07/23/2017   Procedure: INTRAMEDULLARY (IM) NAIL HUMERAL;  Surgeon: Yolonda Kida, MD;  Location: [facility] OR;  Service: Orthopedics;  Laterality: Left;  . INCISION AND DRAINAGE HIP Left 04/01/2019   Procedure: IRRIGATION AND DEBRIDEMENT HIP;  Surgeon: Samson Frederic, MD;  Location: [facility] ORS;  Service: Orthopedics;  Laterality: Left;  . LUMBAR LAMINECTOMY/DECOMPRESSION MICRODISCECTOMY  04/06/2012   Procedure: LUMBAR LAMINECTOMY/DECOMPRESSION MICRODISCECTOMY;  Surgeon: Kerrin Champagne, MD;  Location[facility] OR;  Service: Orthopedics;  Laterality: N/A;  Left L3-4 and L4-5 lateral recess decompression MIS  . NECK SURGERY  Sept 2005  . TOTAL ABDOMINAL HYSTERECTOMY  Oct 2003   partial  . TOTAL HIP ARTHROPLASTY Left 02/18/2019   Procedure: TOTAL HIP ARTHROPLASTY ANTERIOR APPROACH;  Surgeon: Samson Frederic, MD;  Location: [facility] ORS;  Service: Orthopedics;  Laterality: Left;  . TUBAL LIGATION  1995   Family History Family History  Problem Relation Age of Onset  . Emphysema Father   . Rheum arthritis Father   . Lung cancer Father   . Emphysema Mother     Social History Social History   Tobacco Use  . Smoking status: Current Every Day Smoker    Packs/day: 1.00    Years: 30.00    Pack years: 30.00    Types: Cigarettes  . Smokeless tobacco: Never Used  . Tobacco comment: currently smoking 1/2 ppd.   Vaping Use  . Vaping Use: Never used  Substance Use Topics  . Alcohol use: Not Currently  . Drug use: No   Allergies Amoxicillin-pot clavulanate, Erythromycin, Moxifloxacin, and Sulfamethoxazole-trimethoprim  Review of Systems Review of Systems All other systems are reviewed and are negative for acute change except as noted in the HPI   Physical Exam Vital Signs  I have  reviewed the triage vital signs BP 139/84   Pulse (!) 122   Temp 98.2 F (36.8 C) (Axillary)   Resp (!) 26   SpO2 99%   Physical Exam Vitals reviewed.  Constitutional:      General: She is not in acute distress.    Appearance: She is well-developed and well-nourished. She is not diaphoretic.  HENT:     Head: Normocephalic and atraumatic.     Nose: Nose normal.  Eyes:     General: No scleral icterus.       Right eye: No discharge.        Left eye: No discharge.     Extraocular Movements: EOM normal.     Conjunctiva/sclera: Conjunctivae normal.     Pupils: Pupils are equal, round, and reactive to light.  Cardiovascular:     Rate and Rhythm: Regular rhythm. Tachycardia present.     Heart sounds: No murmur heard. No friction rub. No gallop.   Pulmonary:     Effort: Tachypnea, accessory muscle usage, prolonged expiration and respiratory distress present.     Breath sounds: Decreased air movement present. No stridor. Wheezing (faint exp wheezing throughout) present. No rales.  Abdominal:     General: There is no distension.     Palpations: Abdomen is soft.     Tenderness: There is no abdominal tenderness.  Musculoskeletal:        General: No tenderness or edema.     Cervical back: Normal range of motion and neck supple.  Skin:    General: Skin is warm and dry.     Findings: No erythema or rash.  Neurological:  Mental Status: She is alert and oriented to person, place, and time.  Psychiatric:        Mood and Affect: Mood and affect normal.     ED Results and Treatments Labs (all labs ordered are listed, but only abnormal results are displayed) Labs Reviewed  CBC WITH DIFFERENTIAL/PLATELET - Abnormal; Notable for the following components:      Result Value   WBC 18.9 (*)    MCHC 28.6 (*)    Neutro Abs 16.7 (*)    Lymphs Abs 0.6 (*)    Monocytes Absolute 1.2 (*)    Abs Immature Granulocytes 0.36 (*)    All other components within normal limits  BRAIN NATRIURETIC  PEPTIDE - Abnormal; Notable for the following components:   B Natriuretic Peptide 203.6 (*)    All other components within normal limits  COMPREHENSIVE METABOLIC PANEL - Abnormal; Notable for the following components:   Chloride 94 (*)    CO2 35 (*)    Glucose, Bld 133 (*)    Calcium 8.6 (*)    Total Protein 6.4 (*)    All other components within normal limits  I-STAT ARTERIAL BLOOD GAS, ED - Abnormal; Notable for the following components:   pH, Arterial 7.516 (*)    pO2, Arterial 152 (*)    Bicarbonate 35.9 (*)    TCO2 37 (*)    Acid-Base Excess 11.0 (*)    Sodium 132 (*)    Calcium, Ion 1.09 (*)    All other components within normal limits  TROPONIN I (HIGH SENSITIVITY) - Abnormal; Notable for the following components:   Troponin I (High Sensitivity) 18 (*)    All other components within normal limits  RESP PANEL BY RT-PCR (FLU A&B, COVID) ARPGX2  BLOOD GAS, ARTERIAL                                                                                                                         EKG  EKG Interpretation  Date/Time:  Wednesday September 06 2020 05:30:06 EST Ventricular Rate:  121 PR Interval:    QRS Duration: 86 QT Interval:  296 QTC Calculation: 420 R Axis:   75 Text Interpretation: Sinus tachycardia Confirmed by Drema Pryardama, Diandre Merica [phone]([id number]) on 09/06/2020 5:34:25 AM      Radiology DG Chest Port 1 View  Result Date: 09/06/2020 CLINICAL DATA:  Dyspnea EXAM: PORTABLE CHEST 1 VIEW COMPARISON:  07/25/2020 FINDINGS: Mild bibasilar pulmonary fibrotic change again noted. Lung volumes are small, but are symmetric and pulmonary insufflation appears slightly improved when compared to prior examination. No confluent pulmonary infiltrate. No pneumothorax or pleural effusion. Cardiac size within normal limits. Pulmonary vascularity is normal. No acute bone abnormality. IMPRESSION: Stable mild bibasilar pulmonary fibrotic change. No radiographic evidence of acute cardiopulmonary disease.  Electronically Signed   By: Helyn NumbersAshesh  Parikh MD   On: 09/06/2020 06:22    Pertinent labs & imaging results that were available during my care of the patient were reviewed by me  and considered in my medical decision making (see chart for details).  Medications Ordered in ED Medications  albuterol (PROVENTIL,VENTOLIN) solution continuous neb (10 mg/hr Nebulization New Bag/Given 09/06/20 0629)  diazepam (VALIUM) injection 2.5 mg (2.5 mg Intravenous Given 09/06/20 0601)                                                                                                                                    Procedures .1-3 Lead EKG Interpretation Performed by: Nira Conn, MD Authorized by: Nira Conn, MD     Interpretation: normal     ECG rate:  112   ECG rate assessment: tachycardic     Rhythm: sinus tachycardia     Ectopy: none     Conduction: normal   .Critical Care Performed by: Nira Conn, MD Authorized by: Nira Conn, MD   Critical care provider statement:    Critical care time (minutes):  45   Critical care was necessary to treat or prevent imminent or life-threatening deterioration of the following conditions:  Respiratory failure   Critical care was time spent personally by me on the following activities:  Discussions with consultants, evaluation of patient's response to treatment, examination of patient, ordering and performing treatments and interventions, ordering and review of laboratory studies, ordering and review of radiographic studies, pulse oximetry, re-evaluation of patient's condition, obtaining history from patient or surrogate and review of old charts    (including critical care time)  Medical Decision Making / ED Course I have reviewed the nursing notes for this encounter and the patient's prior records (if available in EHR or on provided paperwork).   [name] was evaluated in Emergency Department on 09/06/2020 for  the symptoms described in the history of present illness. She was evaluated in the context of the global COVID-19 pandemic, which necessitated consideration that the patient might be at risk for infection with the SARS-CoV-2 virus that causes COVID-19. Institutional protocols and algorithms that pertain to the evaluation of patients at risk for COVID-19 are in a state of rapid change based on information released by regulatory bodies including the CDC and federal and state organizations. These policies and algorithms were followed during the patient's care in the ED.  Respiratory distress Likely COPD exacerbation given presentation. Reportedly being worked up for HF.  Placed on BiPAP. Small dose of valium needed for anxiety.  EKG with sinus tachycardia  CXR w/o PNA, PTx, or pulmonary edema. Leukocytosis on CBC - close to her baseline. Intact renal function.  Mildly elevated BNP, but <500. CHF unlikely. Trop slightly elevated - likely demand. Doubt ACS.  Delay in ABG sampling, but obtained 1.5 hrs after arrival and reassuring w/o acidosis. Currently getting additional albuterol pass through nebs. Will attempt to wean off of BiPAP once complete.  Requires admission for further management.      Final Clinical Impression(s) / ED Diagnoses Final diagnoses:  Respiratory distress  This chart was dictated using voice recognition software.  Despite best efforts to proofread,  errors can occur which can change the documentation meaning.   Nira Conn, MD 09/06/20 [phone]

## 2020-09-06 NOTE — H&P (Signed)
Date: 09/06/2020               Patient Name:  [name] MRN: [id number]  DOB: 04-16-1966 Age / Sex: 54 y.o., female   PCP: [name], MD         Medical Service: Internal Medicine Teaching Service         Attending Physician: Dr. Sandre Kitty Elwin Mocha, MD    First Contact: Dr. Kirke Corin  Pager: [phone]  Second Contact: Dr. Marchia Bond Pager: [phone]       After Hours (After 5p/  First Contact Pager: [phone]  weekends / holidays): Second Contact Pager: [phone]   Chief Complaint: Shortness of breast, weakness  History of Present Illness: 54 y/o female with PMHx of COPD on chronic prednisone, azithromycin and on 4 L O2 home, HTN, HLD, chronic pain on Norco and morphine, tobacco use, OSAnonadherent toCPAP, who presented with worsening of shortness of breath, weakness. Patient is somnolent on admission but easy to arose and answers questions. Her husband also assisted with providing information and history.  Per her husband, Mr. Axons COPD has been getting worse recently. She has developed more shortness of breath over past week, required to use her oxygen at rest too sometimes. She has mild cough with some sputum. She reports compliance to her medications. She states that she takes 10-20 mg of Prednisone daily chronically and took extra doses (up to 80 mg) past few dose to help with her symptoms. She has had chronic wound of left leg. No drainage or new swelling. Asking about her legs swelling, erythema and bluish color of her toes, she states that her legs look like this since while ago. No fever or chills. No chest pain. No N/V or abdominal pain. No diarrhea. She is vaccinated against COVID and denies sick contact.   On EMS arrival, she was in respiratory distress, O2 sat 91% at 4 li O2.  Meds:  Current Meds  Medication Sig  . albuterol (PROVENTIL) (2.5 MG/3ML) 0.083% nebulizer solution Take 3 mLs (2.5 mg total) by nebulization every 4 (four) hours as needed for wheezing or  shortness of breath.  Marland Kitchen azithromycin (ZITHROMAX) 250 MG tablet Take 250 mg by mouth daily.  . Budeson-Glycopyrrol-Formoterol (BREZTRI AEROSPHERE) 160-9-4.8 MCG/ACT AERO Inhale 2 puffs into the lungs in the morning and at bedtime.  . cetirizine (ZYRTEC) 10 MG tablet Take 10 mg daily by mouth.  . COLLAGEN PO Take 1,000 mg by mouth 3 (three) times daily.  . DULoxetine (CYMBALTA) 30 MG capsule Take 30 mg by mouth in the morning and at bedtime.  . fluticasone (FLONASE) 50 MCG/ACT nasal spray Place 1 spray into both nostrils daily as needed for allergies.  . furosemide (LASIX) 20 MG tablet Take 1 tablet (20 mg total) by mouth daily as needed for edema (weight gain of 3lbs in 1 day or 5 lbs in 2 days.). (Patient taking differently: Take 20 mg by mouth daily.)  . gabapentin (NEURONTIN) 400 MG capsule Take 400 mg by mouth 3 (three) times daily.  Marland Kitchen HYDROcodone-acetaminophen (NORCO) 10-325 MG tablet Take 1 tablet by mouth every 6 (six) hours as needed (breakthrough pain). (Patient taking differently: Take 1 tablet by mouth 3 (three) times daily.)  . montelukast (SINGULAIR) 10 MG tablet Take 10 mg at bedtime by mouth.  . morphine (MS CONTIN) 30 MG 12 hr tablet Take 30 mg by mouth every 12 (twelve) hours.  . OXYGEN Inhale 4 L into the lungs continuous.   Marland Kitchen  pantoprazole (PROTONIX) 40 MG tablet Take 40 mg by mouth 2 (two) times daily.  . predniSONE (DELTASONE) 10 MG tablet Take 10 mg by mouth daily with breakfast.  . Vitamin D, Ergocalciferol, (DRISDOL) 1.25 MG (50000 UNIT) CAPS capsule Take 50,000 Units by mouth every Monday.     Allergies: Allergies as of 09/06/2020 - Review Complete 09/06/2020  Allergen Reaction Noted  . Amoxicillin-pot clavulanate Itching 11/14/2015  . Erythromycin Rash   . Moxifloxacin Itching   . Sulfamethoxazole-trimethoprim Itching 08/01/2014   Past Medical History:  Diagnosis Date  . Allergic rhinitis   . Asthma   . COPD (chronic obstructive pulmonary disease) (HCC)    on  home O2  . GERD (gastroesophageal reflux disease)   . Hypertension   . Open wound of left hip    spouse changes dressing q day  . Shortness of breath   . Sleep apnea    NO DEVICE IN USE, USES SUPPLEMENTAL O2 IN PLACE OF DEVICE   . Supplemental oxygen dependent    4L CONTINUOUS     Family History:  Family History  Problem Relation Age of Onset  . Emphysema Father   . Rheum arthritis Father   . Lung cancer Father   . Emphysema Mother      Social History: Current smoker, no alcohol use, no illicit drug use  Review of Systems: A complete ROS was negative except as per HPI. Negative except as mentioned in HPI.  Physical Exam: Blood pressure 98/72, pulse 92, temperature 98.9 F (37.2 C), temperature source Axillary, resp. rate 20, weight 86.2 kg, SpO2 99 %. Physical Exam Constitutional:      Appearance: She is obese.  Cardiovascular:     Comments: Diminished distal pulse of LLE Toes blue discoloration Pulmonary:     Comments: Decreased breath sound, abdominal breathing Abdominal:     General: Bowel sounds are normal.     Palpations: Abdomen is soft.  Musculoskeletal:        General: Swelling and signs of injury present.     Right lower leg: Edema present.     Left lower leg: Edema present.  Skin:    General: Skin is warm and dry.     Findings: Erythema present.  Neurological:     Mental Status: She is oriented to person, place, and time.  Psychiatric:        Mood and Affect: Mood normal.        Behavior: Behavior normal.        Thought Content: Thought content normal.        Judgment: Judgment normal.     EKG: personally reviewed my interpretation is sinus tachycardia CXR: personally reviewed my interpretation is vascular congestion, hypoinflation  Assessment & Plan by Problem: Active Problems:   COPD exacerbation (HCC)  Worsening of SOB: Acute on chronic respiratory failure Likely obesity hypoventilation syndrome COPD exacerbation (How ever, CO2 is not  significantly high and CXR does not show hyperinflation.) Appears to have component of untreated sleep apnea/obesity hypoventilation syndrome. (she and not compliance to CPAP.)  Required BiPAP in ED. Now saturating well on room air>ADDENDUM: placed on BIPAP again due to respiratory distress Arterial Blood Gas result when being on BiPAP:  PO2 152; pCO2 44.3; pH 7.51;  HCO3 35.9,   -S/p Solumedrol, MgSulfate, Duonebx 2 y EMS.  -Continue Azithromycin -Solumedrol 40 mg BID for 2 more doses  -Duoneb q 6h, Budesonide BID, albuterol PRN -f/u covid result -Procalcitonin -Diuresis -Echo to evaluate heart function and  evaluate for PHTN  CHF: Mild to moderated volume overload on exam. Has congestioncon CXR. On lasix 20 mg PRN at home  -IV lasix 20 mg once -Reassess tomorrow for next dose of lasix -I and Os -Weight daily -Echo  Left lower extremity swelling Diminished pulse of left lower extremities.  Apparently, these changes have been chronic per patient.-The swelling of left leg is localized without tenderness and does not appear to be acute DVT. Well get and Venous Doppler study to rule it out though.  Rt LE ABI on last hospitalization was normal. Left ABI was not done then due to wound.  -Left ABI -Continue ASA -Lipid profile and start on Statin  ADDENDUM: Patient had (transient worsening of toes discoloration per RN). On re evaluation Rt toes are more pick but left toes is mildly darker than this morning. ABI and venous doppler have not done yet. (ABI will be hard to obtain given wound).   Paged vascular surgery for recommendation meanwhile.  -Vascular surgery consulted. Awaiting call back ADDENDUM: Per vascular lab, no DVT on doppler. Was not able to do ABI due to presence of the wound but  quick evaluation of arterial vasculature was reassurance.  Chronic pain: No overuse Hx per husband. Holding morphine and Oxy today given respiratory failure. Resume when respiratory status  improves to prevent withdrawal.  Leukocytosis Chronic. Likely 2/2 prednisone use chronically.  Afebrile and the wound on LLE does not appear to be actively infected. Will check BC and if evidence of drainage from the wound, will start Abx.  -Cbc daily -F/u [facility]  Elevated Trop: Troponin  18>21>22>24  No chest pain and no EKG changes.  Does not appear to be ACS. Will monitor and trend  Diet: HH IV fluid: None VTE ppx: Lovenox Code status: Full  Dispo: Admit patient to Inpatient with expected length of stay greater than 2 midnights.  SignedChevis Pretty, MD 09/06/2020, 6:04 PM  Pager: [phone] After 5pm on weekdays and 1pm on weekends: On Call pager: [phone]

## 2020-09-06 NOTE — Consult Note (Addendum)
WOC Nurse Consult Note: Patient receiving care in [facility] 2W19 Reason for Consult: LLE wound Wound type: Full thickness distal anterior LLE wound Pressure Injury POA: NA Measurement: 5.5 cm x 1.7 cm x 0.1 cm Wound bed: Pink/red with some yellow fat globules Drainage (amount, consistency, odor) None Periwound: Dry Crusting edges Dressing procedure/placement/frequency: Clean the wound with NS, pat dry. Apply folded piece of Xeroform gauze [place] # 294) to the bed of the wound. Cover with 4 x 4 and secure with Kerlix. Change daily.  Dressing change completed on assessment.  Monitor the wound area(s) for worsening of condition such as: Signs/symptoms of infection, increase in size, development of or worsening of odor, development of pain, or increased pain at the affected locations.   Notify the medical team if any of these develop.  Thank you for the consult. WOC nurse will not follow at this time.   Please re-consult the WOC team if needed.  Renaldo Reel Katrinka Blazing, MSN, RN, CMSRN, Angus Seller, [facility] Wound Treatment Associate Pager [phone]

## 2020-09-06 NOTE — ED Notes (Signed)
Pt comfortably resting in bed with eyes closed. Respirations are labored and pt is on high flow nasal canula, tolerating much better than bipap mask  Skin is warm, dry and intact  Spouse remains at bedside.

## 2020-09-06 NOTE — Telephone Encounter (Signed)
Thank you, appreciated

## 2020-09-06 NOTE — Telephone Encounter (Signed)
Vascular tech called to give report of procedure done today, gave him dr Sandre Kitty and [employer] pagers and triage private ph# in case they do not answer.

## 2020-09-06 NOTE — Progress Notes (Signed)
Pt taken off bipap per MD request and placed on CAT mask to finish neb tx. Pt c/o of headach, being hot, not being able to breathe because she is hot, and being anxious. RT placed cold wash rags on pt's forehead and back of beck to help her cool off and possibly help with anxiety. Pt in no resp distress. No increased wob, no obvious sob, VS WNL. RT will continue to monitor.

## 2020-09-07 DIAGNOSIS — I350 Nonrheumatic aortic (valve) stenosis: Secondary | ICD-10-CM

## 2020-09-07 DIAGNOSIS — I5033 Acute on chronic diastolic (congestive) heart failure: Secondary | ICD-10-CM | POA: Diagnosis present

## 2020-09-07 DIAGNOSIS — J441 Chronic obstructive pulmonary disease with (acute) exacerbation: Secondary | ICD-10-CM | POA: Diagnosis not present

## 2020-09-07 LAB — BASIC METABOLIC PANEL
Anion gap: 9 (ref 5–15)
BUN: 12 mg/dL (ref 6–20)
CO2: 38 mmol/L — ABNORMAL HIGH (ref 22–32)
Calcium: 8.8 mg/dL — ABNORMAL LOW (ref 8.9–10.3)
Chloride: 94 mmol/L — ABNORMAL LOW (ref 98–111)
Creatinine, Ser: 0.69 mg/dL (ref 0.44–1.00)
GFR, Estimated: >60 mL/min (ref >60)
Glucose, Bld: 138 mg/dL — ABNORMAL HIGH (ref 70–99)
Potassium: 5 mmol/L (ref 3.5–5.1)
Sodium: 141 mmol/L (ref 135–145)

## 2020-09-07 LAB — CBC
HCT: 39 % (ref 36.0–46.0)
Hemoglobin: 11.7 g/dL — ABNORMAL LOW (ref 12.0–15.0)
MCH: 27.3 pg (ref 26.0–34.0)
MCHC: 30 g/dL (ref 30.0–36.0)
MCV: 90.9 fL (ref 80.0–100.0)
Platelets: 225 10*3/uL (ref 150–400)
RBC: 4.29 MIL/uL (ref 3.87–5.11)
RDW: 15.8 % — ABNORMAL HIGH (ref 11.5–15.5)
WBC: 14.8 10*3/uL — ABNORMAL HIGH (ref 4.0–10.5)
nRBC: 0 % (ref 0.0–0.2)

## 2020-09-07 LAB — BLOOD GAS, ARTERIAL
Acid-Base Excess: 15.8 mmol/L — ABNORMAL HIGH (ref 0.0–2.0)
Bicarbonate: 41.8 mmol/L — ABNORMAL HIGH (ref 20.0–28.0)
FIO2: 40
O2 Saturation: 97.6 %
Patient temperature: 36.9
pCO2 arterial: 73.2 mmHg (ref 32.0–48.0)
pH, Arterial: 7.374 (ref 7.350–7.450)
pO2, Arterial: 101 mmHg (ref 83.0–108.0)

## 2020-09-07 LAB — HEMOGLOBIN A1C
Hgb A1c MFr Bld: 6.1 % — ABNORMAL HIGH (ref 4.8–5.6)
Mean Plasma Glucose: 128.37 mg/dL

## 2020-09-07 MED ORDER — MORPHINE SULFATE (PF) 4 MG/ML IV SOLN
4.0000 mg | Freq: Every day | INTRAVENOUS | Status: DC
  Administered 2020-09-07 – 2020-09-08 (×3): 4 mg via INTRAVENOUS
  Filled 2020-09-07 (×3): qty 1

## 2020-09-07 MED ORDER — HYDROCODONE-ACETAMINOPHEN 5-325 MG PO TABS
1.0000 | ORAL_TABLET | ORAL | Status: DC | PRN
  Administered 2020-09-08: 1 via ORAL
  Filled 2020-09-07 (×2): qty 1

## 2020-09-07 MED ORDER — MORPHINE SULFATE (PF) 10 MG/ML IV SOLN: 10.0000 mg | Freq: Two times a day (BID) | INTRAVENOUS | Status: DC

## 2020-09-07 MED ORDER — ADULT MULTIVITAMIN W/MINERALS CH
1.0000 | ORAL_TABLET | Freq: Every day | ORAL | Status: DC
  Administered 2020-09-08 – 2020-09-10 (×3): 1 via ORAL
  Filled 2020-09-07 (×3): qty 1

## 2020-09-07 MED ORDER — METHYLPREDNISOLONE SODIUM SUCC 40 MG IJ SOLR: 40.0000 mg | Freq: Two times a day (BID) | INTRAMUSCULAR | Status: DC

## 2020-09-07 MED ORDER — PREDNISONE 20 MG PO TABS
40.0000 mg | ORAL_TABLET | Freq: Every day | ORAL | Status: DC
  Administered 2020-09-07 – 2020-09-10 (×4): 40 mg via ORAL
  Filled 2020-09-07 (×4): qty 2

## 2020-09-07 MED ORDER — ENSURE ENLIVE PO LIQD
237.0000 mL | Freq: Two times a day (BID) | ORAL | Status: DC
  Administered 2020-09-07 – 2020-09-10 (×4): 237 mL via ORAL

## 2020-09-07 MED ORDER — HYDROCODONE-ACETAMINOPHEN 5-325 MG PO TABS: 1.0000 | ORAL_TABLET | Freq: Four times a day (QID) | ORAL | Status: DC | PRN

## 2020-09-07 NOTE — Progress Notes (Signed)
Patient on Bipap, was removed to take medications and to eat breakfast. Before any intake could occur the patient stated she could not breath. O2 remained at 96% per the monitor. Patient continued to feel that she could not breath and started to sweat and hyper ventilate. She was then placed back on the Bipap. Respiratory therapy was notified and stated they would follow up with her. Will continue to monitor the patient at this time.  -Estella Husk, RN

## 2020-09-07 NOTE — Discharge Summary (Signed)
Name: LAURELLA TULL MRN: [id number] DOB: 10-20-1965 54 y.o. PCP: [name], MD  Date of Admission: 09/06/2020  5:25 AM Date of Discharge: 09/11/19 Attending Physician: Dr. Oswaldo Done  Discharge Diagnosis: Principal Problem:   COPD exacerbation (HCC) Active Problems:   Obesity due to excess calories   Aortic stenosis   Acute on chronic heart failure with preserved ejection fraction (HFpEF) (HCC)    Discharge Medications: Allergies as of 09/10/2020      Reactions   Amoxicillin-pot Clavulanate Itching   Ok with benadryl Did it involve swelling of the face/tongue/throat, SOB, or low BP? No Did it involve sudden or severe rash/hives, skin peeling, or any reaction on the inside of your mouth or nose? No Did you need to seek medical attention at a hospital or doctor's office? No When did it last happen?6 months If all above answers are "NO", may proceed with cephalosporin use.   Erythromycin Rash   Ok with benadryl    Moxifloxacin Itching   Reaction to Avelox - ok with benadryl   Sulfamethoxazole-trimethoprim Itching   Ok with benadryl      Medication List    STOP taking these medications   docusate sodium 100 MG capsule Commonly known as: COLACE   magic mouthwash Soln   ondansetron 4 MG tablet Commonly known as: ZOFRAN   senna 8.6 MG Tabs tablet Commonly known as: SENOKOT     TAKE these medications   albuterol (2.5 MG/3ML) 0.083% nebulizer solution Commonly known as: PROVENTIL Take 3 mLs (2.5 mg total) by nebulization every 4 (four) hours as needed for wheezing or shortness of breath.   aspirin 81 MG chewable tablet Chew 1 tablet (81 mg total) by mouth 2 (two) times daily.   azithromycin 250 MG tablet Commonly known as: ZITHROMAX Take 250 mg by mouth daily.   Breztri Aerosphere 160-9-4.8 MCG/ACT Aero Generic drug: Budeson-Glycopyrrol-Formoterol Inhale 2 puffs into the lungs in the morning and at bedtime.   cetirizine 10 MG tablet Commonly known  as: ZYRTEC Take 10 mg daily by mouth.   COLLAGEN PO Take 1,000 mg by mouth 3 (three) times daily.   DULoxetine 30 MG capsule Commonly known as: CYMBALTA Take 30 mg by mouth in the morning and at bedtime.   fluticasone 50 MCG/ACT nasal spray Commonly known as: FLONASE Place 1 spray into both nostrils daily as needed for allergies.   furosemide 20 MG tablet Commonly known as: Lasix Take 1 tablet (20 mg total) by mouth daily as needed for edema (weight gain of 3lbs in 1 day or 5 lbs in 2 days.). What changed: when to take this   gabapentin 400 MG capsule Commonly known as: NEURONTIN Take 400 mg by mouth 3 (three) times daily. What changed: Another medication with the same name was removed. Continue taking this medication, and follow the directions you see here.   HYDROcodone-acetaminophen 10-325 MG tablet Commonly known as: NORCO Take 1 tablet by mouth every 6 (six) hours as needed (breakthrough pain). What changed: when to take this   losartan-hydrochlorothiazide 100-12.5 MG tablet Commonly known as: HYZAAR Take 1 tablet by mouth daily.   montelukast 10 MG tablet Commonly known as: SINGULAIR Take 10 mg at bedtime by mouth.   morphine 30 MG 12 hr tablet Commonly known as: MS CONTIN Take 30 mg by mouth every 12 (twelve) hours.   OXYGEN Inhale 4 L into the lungs continuous.   pantoprazole 40 MG tablet Commonly known as: PROTONIX Take 40 mg by mouth  2 (two) times daily.   predniSONE 20 MG tablet Commonly known as: Deltasone Take 2 tablets (40 mg total) by mouth daily with breakfast. Take 2 tablets (40 mg total) by mouth daily with breakfast for 3 days, followed by 1 tablet (20 mg) for 5 days. What changed:   medication strength  how much to take  additional instructions   Vitamin D (Ergocalciferol) 1.25 MG (50000 UNIT) Caps capsule Commonly known as: DRISDOL Take 50,000 Units by mouth every Monday.            Discharge Care Instructions  (From admission,  onward)         Start     Ordered   09/10/20 0000  Leave dressing on - Keep it clean, dry, and intact until clinic visit        09/10/20 1301          Disposition and follow-up:   Ms.Fiana S Lapenna was discharged from [facility] in Stable condition.  At the hospital follow up visit please address:  1.  Follow-up:  A. Completes prednisone taper    B. Aortic Stenosis management  2.  Labs / imaging needed at time of follow-up: N/A  3.  Pending labs/ test needing follow-up: None  Follow-up Appointments:  Follow-up Information    [name], MD. Schedule an appointment as soon as possible for a visit in 1 week(s).   Specialty: Family Medicine Contact information: [address] [place] [address] [phone]        [facility] Follow up.   Why: For home health services Contact information: [address] [place] [address] [phone]               Hospital Course by problem list: SAMAMTHA TIEGS is a 54 y.o. female with PMHx of COPD on chronic prednisone, azithromycin and on 4 L O2 home, HTN, HLD, chronic pain on Norco and morphine, tobacco use, OSA nonadherent to CPAP, who presented with worsening of shortness of breath, weakness and found to have COPD exacerbation requiring BiPAP.      Acute on chronic respiratory failure Obesity hypoventilation syndrome COPD (GOLD C) exacerbation  Patient remained on BiPap overnight. Unable to tolerate coming off for more than 20 minutes this morning. Repeat ABG showed normal pH but elevated pCO2 of 73.2 and bicarb of 41.8. Likely chronic hypercapnia in the setting COPD. Normal pH likely due to renal compensation. Re-evaluated patient in the afternoon and was able to wean off BiPap. Currently on 3 LNC with O2 sats around 92-98%. Will wtach respiratory status and mental status closely due to increased risk of intubation.  --Discontinue IV solumedrol and  start prednisone 40 mg daily --Continue Azithromycin 250 mg daily --Continue Singular, pulmicort and nebulizer --Keep O2 above 90% --Continue tele --Swallow screen --PT/OT eval and treat   CHF Elevated Trop Patient mildly fluid overloaded on exam with 2+ lower extremity pitting edema on exam. Repeat ECHO shows EF 65-70%  But severe aortic valve stenosis. Patient endorse dyspnea on exertion and occasional dizziness. Denies CP.  --Continue Lasix 20 mg daily --Strict I&Os --Daily weights   Left lower extremity swelling No DVT on doppler by Vascular. Arterial vasculature reassuring. Faint dorsal pedalis pulses. Likely 2/2 to HF. Will continue diuresing and reassess. ASCVD score of 6.6%. Plan to start on moderate intensity statin as respiratory status improve. --Continue ASA --Needs moderate intensity statin   Chronic pain: On home Norco 10-325  mg q6h prn and MS contin 30 mg twice daily. Also on gabpentin and Cymbalta at home. Plan to start on dose-reduce opiate to avoid opiate withdrawal.  --Start COWS  --Start on Norco 5-325 q4h prn   Leukocytosis Resolving. WBC down to 14.8 from 19.2. Likely 2/2 her chronic steroid use. She has remained afebrile. --Daily CBC     Discharge Vitals:   BP (!) 161/98 (BP Location: Right Arm)   Pulse (!) 106   Temp 98.4 F (36.9 C)   Resp 19   Wt 84.6 kg   SpO2 98%   BMI 33.04 kg/m   Pertinent Labs, Studies, and Procedures:  CBC Latest Ref Rng & Units 09/08/2020 09/07/2020 09/06/2020  WBC 4.0 - 10.5 K/uL 12.5(H) 14.8(H) 19.2(H)  Hemoglobin 12.0 - 15.0 g/dL 21.3 11.7(L) 11.7(L)  Hematocrit 36.0 - 46.0 % 42.3 39.0 41.0  Platelets 150 - 400 K/uL 223 225 233    CMP Latest Ref Rng & Units 09/08/2020 09/07/2020 09/06/2020  Glucose 70 - 99 mg/dL 77 086(V) -  BUN 6 - 20 mg/dL 18 12 -  Creatinine 7.84 - 1.00 mg/dL 6.96 2.95 2.84  Sodium 135 - 145 mmol/L 144 141 -  Potassium 3.5 - 5.1 mmol/L 3.5 5.0 -  Chloride 98 - 111 mmol/L 92(L) 94(L) -   CO2 22 - 32 mmol/L 43(H) 38(H) -  Calcium 8.9 - 10.3 mg/dL 1.3(K) 4.4(W) -  Total Protein 6.5 - 8.1 g/dL - - -  Total Bilirubin 0.3 - 1.2 mg/dL - - -  Alkaline Phos 38 - 126 U/L - - -  AST 15 - 41 U/L - - -  ALT 0 - 44 U/L - - -    DG Chest Port 1 View  Result Date: 09/06/2020 CLINICAL DATA:  Dyspnea EXAM: PORTABLE CHEST 1 VIEW COMPARISON:  07/25/2020 FINDINGS: Mild bibasilar pulmonary fibrotic change again noted. Lung volumes are small, but are symmetric and pulmonary insufflation appears slightly improved when compared to prior examination. No confluent pulmonary infiltrate. No pneumothorax or pleural effusion. Cardiac size within normal limits. Pulmonary vascularity is normal. No acute bone abnormality. IMPRESSION: Stable mild bibasilar pulmonary fibrotic change. No radiographic evidence of acute cardiopulmonary disease. Electronically Signed   By: Helyn Numbers MD   On: 09/06/2020 06:22   ECHOCARDIOGRAM COMPLETE  Result Date: 09/06/2020    ECHOCARDIOGRAM REPORT   Patient Name:   ERIONA KINCHEN Date of Exam: 09/06/2020 Medical Rec #:  [id number]      Height:       63.0 in Accession #:    [id number]     Weight:       190.0 lb Date of Birth:  1966/06/14     BSA:          1.892 m Patient Age:    54 years       BP:           98/72 mmHg Patient Gender: F              HR:           92 bpm. Exam Location:  Inpatient Procedure: 2D Echo, Cardiac Doppler, Color Doppler and Intracardiac            Opacification Agent Indications:    Acute respiratory distress  History:        Patient has prior history of Echocardiogram examinations, most                 recent 07/21/2017. COPD, Aortic  Valve Disease,                 Signs/Symptoms:Dyspnea; Risk Factors:Hypertension and Sleep                 Apnea.  Sonographer:    Ross Ludwig RDCS (AE) Referring Phys: [id number] Anne Shutter  Sonographer Comments: Suboptimal parasternal window, suboptimal apical window, Technically difficult study due to poor echo  windows and patient is morbidly obese. Image acquisition challenging due to patient body habitus. IMPRESSIONS  1. Left ventricular ejection fraction, by estimation, is 65 to 70%. The left ventricle has normal function. The left ventricle has no regional wall motion abnormalities. Left ventricular diastolic parameters are indeterminate.  2. Right ventricular systolic function is normal. The right ventricular size is normal. Tricuspid regurgitation signal is inadequate for assessing PA pressure.  3. The mitral valve is grossly normal. No evidence of mitral valve regurgitation. No evidence of mitral stenosis.  4. The inferior vena cava is dilated in size with >50% respiratory variability, suggesting right atrial pressure of 8 mmHg.  5. The aortic valve was not well visualized. Aortic valve regurgitation is not visualized. Severe aortic valve stenosis. Vmax 4.3 m/s, MG 46 mmHg, AVA 0.7 cm^2, DI 0.25, consistent with severe aortic stenosis FINDINGS  Left Ventricle: Left ventricular ejection fraction, by estimation, is 65 to 70%. The left ventricle has normal function. The left ventricle has no regional wall motion abnormalities. Definity contrast agent was given IV to delineate the left ventricular  endocardial borders. The left ventricular internal cavity size was normal in size. There is no left ventricular hypertrophy. Left ventricular diastolic parameters are indeterminate. Right Ventricle: The right ventricular size is normal. No increase in right ventricular wall thickness. Right ventricular systolic function is normal. Tricuspid regurgitation signal is inadequate for assessing PA pressure. Left Atrium: Left atrial size was normal in size. Right Atrium: Right atrial size was normal in size. Pericardium: Trivial pericardial effusion is present. Mitral Valve: The mitral valve is grossly normal. No evidence of mitral valve regurgitation. No evidence of mitral valve stenosis. Tricuspid Valve: The tricuspid valve is not  well visualized. Tricuspid valve regurgitation is not demonstrated. Aortic Valve: The aortic valve was not well visualized. Aortic valve regurgitation is not visualized. Severe aortic stenosis is present. Aortic valve mean gradient measures 46.0 mmHg. Aortic valve peak gradient measures 73.6 mmHg. Aortic valve area, by VTI measures 0.73 cm. Pulmonic Valve: The pulmonic valve was not well visualized. Pulmonic valve regurgitation is not visualized. Aorta: The aortic root is normal in size and structure. Venous: The inferior vena cava is dilated in size with greater than 50% respiratory variability, suggesting right atrial pressure of 8 mmHg. IAS/Shunts: The interatrial septum was not well visualized.  LEFT VENTRICLE PLAX 2D LVOT diam:     1.90 cm  Diastology LV SV:         69       LV e' medial:    6.85 cm/s LV SV Index:   36       LV E/e' medial:  14.0 LVOT Area:     2.84 cm LV e' lateral:   11.20 cm/s                         LV E/e' lateral: 8.6  RIGHT VENTRICLE             IVC RV Basal diam:  3.20 cm     IVC diam: 2.30 cm  RV S prime:     12.50 cm/s TAPSE (M-mode): 2.5 cm LEFT ATRIUM           Index       RIGHT ATRIUM           Index LA Vol (A2C): 28.3 ml 14.96 ml/m RA Area:     16.50 cm LA Vol (A4C): 35.4 ml 18.71 ml/m RA Volume:   41.40 ml  21.88 ml/m  AORTIC VALVE AV Area (Vmax):    0.81 cm AV Area (Vmean):   0.71 cm AV Area (VTI):     0.73 cm AV Vmax:           429.00 cm/s AV Vmean:          323.000 cm/s AV VTI:            0.948 m AV Peak Grad:      73.6 mmHg AV Mean Grad:      46.0 mmHg LVOT Vmax:         123.00 cm/s LVOT Vmean:        80.600 cm/s LVOT VTI:          0.243 m LVOT/AV VTI ratio: 0.26  AORTA Ao Root diam: 3.40 cm MITRAL VALVE MV Area (PHT): 2.82 cm     SHUNTS MV Decel Time: 269 msec     Systemic VTI:  0.24 m MV E velocity: 96.10 cm/s   Systemic Diam: 1.90 cm MV A velocity: 117.00 cm/s MV E/A ratio:  0.82 Epifanio Lesches MD Electronically signed by Epifanio Lesches MD Signature  Date/Time: 09/06/2020/10:57:01 PM    Final    VAS [place] LOWER EXTREMITY VENOUS (DVT)  Result Date: 09/06/2020  Lower Venous DVT Study Indications: Swelling.  Risk Factors: None identified. Limitations: Body habitus, poor ultrasound/tissue interface and patient positioning, patient somnolence. Comparison Study: No prior studies. Performing Technologist: Chanda Busing RVT  Examination Guidelines: A complete evaluation includes B-mode imaging, spectral Doppler, color Doppler, and power Doppler as needed of all accessible portions of each vessel. Bilateral testing is considered an integral part of a complete examination. Limited examinations for reoccurring indications may be performed as noted. The reflux portion of the exam is performed with the patient in reverse Trendelenburg.  +-----+---------------+---------+-----------+----------+--------------+ RIGHTCompressibilityPhasicitySpontaneityPropertiesThrombus Aging +-----+---------------+---------+-----------+----------+--------------+ CFV  Full           Yes      Yes                                 +-----+---------------+---------+-----------+----------+--------------+   +---------+---------------+---------+-----------+----------+-------------------+ LEFT     CompressibilityPhasicitySpontaneityPropertiesThrombus Aging      +---------+---------------+---------+-----------+----------+-------------------+ CFV      Full           Yes      Yes                                      +---------+---------------+---------+-----------+----------+-------------------+ SFJ      Full                                                             +---------+---------------+---------+-----------+----------+-------------------+ FV Prox  Full                                                             +---------+---------------+---------+-----------+----------+-------------------+  FV Mid   Full                                                              +---------+---------------+---------+-----------+----------+-------------------+ FV DistalFull                                                             +---------+---------------+---------+-----------+----------+-------------------+ PFV      Full                                                             +---------+---------------+---------+-----------+----------+-------------------+ POP                     Yes      Yes                                      +---------+---------------+---------+-----------+----------+-------------------+ PTV      Full                                                             +---------+---------------+---------+-----------+----------+-------------------+ PERO                                                  Not well visualized +---------+---------------+---------+-----------+----------+-------------------+ Arterial flow noted in the common femoral, popliteal, posterior tibial, and anterior tibial arteries all demonstrate triphasic waveforms.    Summary: RIGHT: - No evidence of common femoral vein obstruction.  LEFT: - There is no evidence of deep vein thrombosis in the lower extremity. However, portions of this examination were limited- see technologist comments above.  - No cystic structure found in the popliteal fossa.  *See table(s) above for measurements and observations. Electronically signed by Heath Lark on 09/06/2020 at 5:32:20 PM.    Final      Discharge Instructions: Discharge Instructions    Call MD for:  difficulty breathing, headache or visual disturbances   Complete by: As directed    Call MD for:  extreme fatigue   Complete by: As directed    Call MD for:  persistant dizziness or light-headedness   Complete by: As directed    Call MD for:  severe uncontrolled pain   Complete by: As directed    Diet - low sodium heart healthy   Complete by: As directed    Increase activity slowly   Complete by: As  directed    Leave dressing on - Keep it clean, dry, and intact until clinic visit   Complete by: As  directed       Signed: Dolan AmenWinters, Andra Matsuo, MD 09/12/2020, 11:08 AM   Pager: [phone]

## 2020-09-07 NOTE — Progress Notes (Addendum)
Initial Nutrition Assessment  DOCUMENTATION CODES:  Obesity unspecified  INTERVENTION:   Ensure Enlive po BID, each supplement provides 350 kcal and 20 grams of protein  Magic cup BID with meals, each supplement provides 290 kcal and 9 grams of protein  MVI with minerals daily  NUTRITION DIAGNOSIS:  Increased nutrient needs related to chronic illness (COPD exacberation) as evidenced by estimated needs.  GOAL:  Patient will meet greater than or equal to 90% of their needs  MONITOR:  PO intake,Supplement acceptance,Weight trends,I & O's,Labs  REASON FOR ASSESSMENT:  Consult Assessment of nutrition requirement/status  ASSESSMENT:  Pt admitted with COPD exacerbation. PMH includes COPD, HTN, HLD, chronic pain, tobacco use, OSA.  Pt unavailable at time of RD visit. Per H&P, pt denied N/V/D and abdominal pain.    No PO intake documented. Will order oral nutrition supplements to provide pt with additional kcals/protein given increased nutrient needs.   Reviewed wt history. Unable to evaluate wt hx at this time due to current wt being estimated rather than measured. Recommend updating weight.  No UOP documented.  Labs reviewed. Medications: lasix, solu-medrol, drisdol  NUTRITION - FOCUSED PHYSICAL EXAM: Unable to perform at this time. Will attempt at follow-up.  Diet Order:   Diet Order            Diet Heart Room service appropriate? Yes; Fluid consistency: Thin  Diet effective now               EDUCATION NEEDS:  No education needs have been identified at this time  Skin:  Skin Assessment: Reviewed RN Assessment Last BM:  PTA Ht Readings from Last 1 Encounters:  07/26/20 5\' 3"  (1.6 m)   Wt Readings from Last 1 Encounters:  09/06/20 86.2 kg  BMI:  Body mass index is 33.66 kg/m.  Estimated Nutritional Needs:  Kcal:  1850-2050 Protein:  95-105 grams Fluid:  >1.85L/d   09/08/20, MS, RD, LDN RD pager number and weekend/on-call pager number located in  Amion.

## 2020-09-07 NOTE — Plan of Care (Signed)

## 2020-09-07 NOTE — Progress Notes (Signed)
Just went to the patient's room to reevaluate her respiratory status. She is doing well off of BiPAP.  She appears like she is mentating better with only mild sleepiness which she attributes to recently waking up. She states that she feels dyspneic but that this has improved since admission.  She endorses anxiety when she feels dyspnic which exacerbates her symptoms.  Therefore, she feels more comfortable on the BiPAP as it decreases her anxiety and "air hunger". She also has a difficult time breathing while laying flat. On lung auscultation she continues to have diffuse wheezing.  Plan: - Discussed the patients tenuous situation with the nurse and he agree to call us if her respiratory status declines in anyway.  - Continue to remain off BIPAP as tolerated. If respiratory status declines, then we will get a STAT ABG and restart BIPAP. - We will switch to oral steroids for her COPD exacerbation and continue nebulizer therapy    Chari Manning, D.O.  Internal Medicine Resident, PGY-2 [facility]  Pager: [phone] 4:17 PM, 09/07/2020

## 2020-09-07 NOTE — Progress Notes (Signed)
PT Cancellation Note  Patient Details Name: [name] MRN: [id number] DOB: [birth year]   Cancelled Treatment:    Reason Eval/Treat Not Completed: Other (comment).  Spoke with RN who states pt is currently trying to be off of BiPAP.  PT checked in with pt who appears to be working to maintain a good RR supine in the bed.  O2 sats are in the low 90s on 8L HFNC.  Pt reports she is unable to attempt to try even bed level mobility today because she is concentrating on keeping her breathing under control.  She is agreeable for PT to check back tomorrow.   Thanks,  Corinna Capra, PT, DPT  Acute Rehabilitation [phone] pager #[phone] office       [name] 09/07/2020, 2:33 PM

## 2020-09-07 NOTE — Progress Notes (Signed)
Subjective:   Hospital day: 1  Overnight event: NAEOV. Remained on BIPAP overnight  This AM, RN took patient off BiPap but patient was able to tolerate only 15-20 min off the BiPap before becoming short of breath. Per husband, patient could not breathe well or eat with the mask off. He also reports that patient has had leg wound on leg for over a month with shiny, swelling and skin tightening.   Objective:  Vital signs in last 24 hours: Vitals:   09/06/20 2143 09/07/20 0000 09/07/20 0112 09/07/20 0500  BP: 125/84   128/85  Pulse: 99   100  Resp: (!) 21 19  18   Temp: 98.7 F (37.1 C)   98.3 F (36.8 C)  TempSrc: Axillary   Oral  SpO2: 100%  96% 99%  Weight:        Physical Exam  General: Somnolent, obese woman laying in bed on BiPap. No acute distress. Head: Normocephalic. Atraumatic. CV: Tachycardic Regular rhythm. No murmurs, rubs, or gallops. 2+ LLE pitting edema to the knee. Pulmonary: On BiPap. Belly breathing. Decreased breath sounds. Moderate wheezing.  Abdominal: Soft, nontender, nondistended. Normal bowel sounds. Extremities: Diminished dorsal pedalis pulses on LLE. RLE wrapped in bandage.  Skin: Warm and dry. Neuro: Alert and oriented x3. Moves all extremities. Normal sensation. No focal deficit. Psych: Anxious mood  Assessment/Plan: [name] is a 54 y.o. female with PMHx of COPD on chronic prednisone, azithromycin and on 4 L O2 home, HTN, HLD, chronic pain on Norco and morphine, tobacco use, OSAnonadherent toCPAP, who presented with worsening of shortness of breath, weakness and found to have COPD exacerbation requiring BiPAP.   Active Problems:   COPD exacerbation (HCC)  Acute on chronic respiratory failure Obesity hypoventilation syndrome COPD (GOLD C) exacerbation  Patient remained on BiPap overnight. Unable to tolerate coming off for more than 20 minutes this morning. Repeat ABG showed normal pH but elevated pCO2 of 73.2 and bicarb of 41.8.  Likely chronic hypercapnia in the setting COPD. Normal pH likely due to renal compensation. Re-evaluated patient in the afternoon and was able to wean off BiPap. Currently on 3 LNC with O2 sats around 92-98%. Will wtach respiratory status and mental status closely due to increased risk of intubation.  --Discontinue IV solumedrol and start prednisone 40 mg daily --Continue Azithromycin 250 mg daily --Continue Singular, pulmicort and nebulizer --Keep O2 above 90% --Continue tele --Swallow screen --PT/OT eval and treat  CHF Elevated Trop Patient mildly fluid overloaded on exam with 2+ lower extremity pitting edema on exam. Repeat ECHO shows EF 65-70%  But severe aortic valve stenosis. Patient endorse dyspnea on exertion and occasional dizziness. Denies CP.  --Continue Lasix 20 mg daily --Strict I&Os --Daily weights  Left lower extremity swelling No DVT on doppler by Vascular. Arterial vasculature reassuring. Faint dorsal pedalis pulses. Likely 2/2 to HF. Will continue diuresing and reassess. ASCVD score of 6.6%. Plan to start on moderate intensity statin as respiratory status improve. --Continue ASA --Needs moderate intensity statin  Chronic pain: On home Norco 10-325 mg q6h prn and MS contin 30 mg twice daily. Also on gabpentin and Cymbalta at home. Plan to start on dose-reduce opiate to avoid opiate withdrawal.  --Start COWS  --Start on Norco 5-325 q4h prn  Leukocytosis Resolving. WBC down to 14.8 from 19.2. Likely 2/2 her chronic steroid use. She has remained afebrile. --Daily CBC  Diet: HH IVF: None VTE: Lovenox CODE: Full Code  Prior to Admission Living Arrangement: Home Anticipated  Discharge Location: Home Barriers to Discharge: Medical work up Dispo: Anticipated discharge in approximately 1-2 day(s).   Signed: Steffanie Rainwater, MD 09/07/2020, 5:42 AM  Pager: [phone] Internal Medicine Teaching Service After 5pm on weekdays and 1pm on weekends: On Call pager:  [phone]

## 2020-09-07 NOTE — Progress Notes (Signed)
OT Cancellation Note  Patient Details Name: [name] MRN: [id number] DOB: August 31, 1966   Cancelled Treatment:    Reason Eval/Treat Not Completed: Medical issues which prohibited therapy. Per RN hold on pt today. Rn reports that pt conitunes feel that she could not breath and started to sweat and hyper ventilate. She was then placed back on the Bipap. OT will follow up next available/appropriate time Galen Manila 09/07/2020, 12:00 PM

## 2020-09-08 DIAGNOSIS — J441 Chronic obstructive pulmonary disease with (acute) exacerbation: Secondary | ICD-10-CM | POA: Diagnosis not present

## 2020-09-08 LAB — CBC
HCT: 42.3 % (ref 36.0–46.0)
Hemoglobin: 12.2 g/dL (ref 12.0–15.0)
MCH: 26.8 pg (ref 26.0–34.0)
MCHC: 28.8 g/dL — ABNORMAL LOW (ref 30.0–36.0)
MCV: 92.8 fL (ref 80.0–100.0)
Platelets: 223 10*3/uL (ref 150–400)
RBC: 4.56 MIL/uL (ref 3.87–5.11)
RDW: 15.8 % — ABNORMAL HIGH (ref 11.5–15.5)
WBC: 12.5 10*3/uL — ABNORMAL HIGH (ref 4.0–10.5)
nRBC: 0 % (ref 0.0–0.2)

## 2020-09-08 LAB — BASIC METABOLIC PANEL
Anion gap: 9 (ref 5–15)
BUN: 18 mg/dL (ref 6–20)
CO2: 43 mmol/L — ABNORMAL HIGH (ref 22–32)
Calcium: 8.7 mg/dL — ABNORMAL LOW (ref 8.9–10.3)
Chloride: 92 mmol/L — ABNORMAL LOW (ref 98–111)
Creatinine, Ser: 0.64 mg/dL (ref 0.44–1.00)
GFR, Estimated: >60 mL/min (ref >60)
Glucose, Bld: 77 mg/dL (ref 70–99)
Potassium: 3.5 mmol/L (ref 3.5–5.1)
Sodium: 144 mmol/L (ref 135–145)

## 2020-09-08 MED ORDER — NYSTATIN 100000 UNIT/ML MT SUSP
5.0000 mL | Freq: Four times a day (QID) | OROMUCOSAL | Status: DC
  Administered 2020-09-08 – 2020-09-10 (×8): 500000 [IU] via ORAL
  Filled 2020-09-08 (×8): qty 5

## 2020-09-08 MED ORDER — SODIUM BICARBONATE/SODIUM CHLORIDE MOUTHWASH
OROMUCOSAL | Status: DC | PRN
  Filled 2020-09-08: qty 1000

## 2020-09-08 MED ORDER — HYDROCODONE-ACETAMINOPHEN 10-325 MG PO TABS
1.0000 | ORAL_TABLET | ORAL | Status: DC | PRN
  Administered 2020-09-08 – 2020-09-10 (×7): 1 via ORAL
  Filled 2020-09-08 (×7): qty 1

## 2020-09-08 MED ORDER — CHLORHEXIDINE GLUCONATE 0.12 % MT SOLN
15.0000 mL | Freq: Two times a day (BID) | OROMUCOSAL | Status: DC
  Administered 2020-09-08 – 2020-09-10 (×5): 15 mL via OROMUCOSAL
  Filled 2020-09-08 (×5): qty 15

## 2020-09-08 MED ORDER — MORPHINE SULFATE ER 15 MG PO TBCR
30.0000 mg | EXTENDED_RELEASE_TABLET | Freq: Two times a day (BID) | ORAL | Status: DC
  Administered 2020-09-08 – 2020-09-10 (×4): 30 mg via ORAL
  Filled 2020-09-08 (×6): qty 2

## 2020-09-08 MED ORDER — ORAL CARE MOUTH RINSE
15.0000 mL | Freq: Two times a day (BID) | OROMUCOSAL | Status: DC
  Administered 2020-09-08 – 2020-09-10 (×4): 15 mL via OROMUCOSAL

## 2020-09-08 NOTE — Plan of Care (Signed)

## 2020-09-08 NOTE — Progress Notes (Signed)
Pt removed from BIPAP and placed on 4L [place]. Pt is tolerating well at this time.

## 2020-09-08 NOTE — Progress Notes (Signed)
Pt was able to alternate between bi-pap and [place] 5L, tolerated well and sustained 02sats above 90%.

## 2020-09-08 NOTE — Progress Notes (Signed)
Subjective:   Hospital day: 2  Overnight event: Received morphine and Norco overnight for pain.  This AM, patient was evaluate at the bedside after just waking up. States she woke up a few times overnight. She was not on BiPap overnight. Put back on BiPap early this morning and taken off by RRT during encounter. Patient denied any SOB, CP or palpitations. States he pain was much better with the IV morphine.  Objective:  Vital signs in last 24 hours: Vitals:   09/07/20 1958 09/08/20 0113 09/08/20 0445 09/08/20 0500  BP: (!) 128/94  (!) 147/100   Pulse: (!) 110  (!) 102   Resp: (!) 24  (!) 22   Temp: 98.6 F (37 C)  98 F (36.7 C)   TempSrc: Axillary  Oral   SpO2: 97% 97% 96%   Weight:    84.9 kg    Physical Exam  General: Sleepy, obese woman laying in bed. Off BiPAP. NAD. HEENT: [place]/AT CV: tachycardia. Regular rhythm. No M/R/G. 1+ LLE pitting edema Pulm: On 4L HFNC. Mild expiratory wheezes. Decreased breath sounds. Nornal effort. Abdominal: Soft. NT/ND. Normal BS. Extremities: Dimished dorsal pedalis pulses on LLE. RLE with palpable pulses.  Neuro: A&Ox3. Moves all extremities.  Psych: Normal mood and affect.  Assessment/Plan: [name] is a 54 y.o. female with PMHx of COPD on chronic prednisone, azithromycin and on 4 L O2 home, HTN, HLD, chronic pain on Norco and morphine, tobacco use, OSAnonadherent toCPAP, who presented with worsening of shortness of breath, weakness and found to have COPD exacerbation requiring BiPAP.   Principal Problem:   COPD exacerbation (HCC) Active Problems:   Obesity due to excess calories   Aortic stenosis   Acute on chronic heart failure with preserved ejection fraction (HFpEF) (HCC)  Acute on chronic respiratory failure Obesity hypoventilation syndrome COPD (GOLD D) exacerbation  Patient was able to come off BiPAP overnight. Tolerated 4LNC throughout the night. Placed on BiPAP early in the morning and later transitioned off  during morning rounds. Patient denied SOB, palpitations or CP. O2 sats above 90s on 4L HFNC. Plan to transition to oral meds and CPAP at night. --Continue prednisone 40 mg daily --Continue Azithromycin 250 mg daily --Continue Singulair, pulmicort and nebulizer --Keep O2 above 90% --CPAP at night. --PT/OT eval and treat  CHF Elevated Trop  Repeat ECHO shows EF 65-70% but severe aortic valve stenosis. Patient continues to endorse DOE but denies any SOB at rest. Patient's weight is down 3 lbs since admission.  --Continue lasix 20 mg daily --Strict I&Os --Daily weights  Left lower extremity swelling Chronic. No DVT on doppler by Vascular. Arterial vasculature reassuring. Faint dorsal pedalis pulses. Likely a combination of HF and venous insufficiency. Will continue diuresing and monitor for improvement  --Continue ASA --Needs moderate intensity statin  Chronic pain On home Norco 10-325 mg q6h prn and MS contin 30 mg twice daily. Received IV morphine and oral Norco overnight. Pain is well-controlled this morning.  --Restarted home Norco 10-325 q6hr --Restarted home MS Contin 30 mg q12h --Continue home gabapentin 300 mg TID  HLD  ASCVD score of 6.6%. Lipid panel shows LDL of 113.  --Plan to initiate moderate intensity statin   Leukocytosis Trending down. WBC down to 12.5 from 19 on admission.  --Daily CBC  Diet: HH IVF: None VTE: Lovenox CODE: Full Code  Prior to Admission Living Arrangement: Home Anticipated Discharge Location: Home Barriers to Discharge: Medical work up Dispo: Anticipated discharge in approximately 1-2 day(s).  Signed: Steffanie Rainwater, MD 09/08/2020, 6:45 AM  Pager: [phone] Internal Medicine Teaching Service After 5pm on weekdays and 1pm on weekends: On Call pager: [phone]

## 2020-09-08 NOTE — Evaluation (Signed)
Physical Therapy Evaluation Patient Details Name: [name] MRN: [id number] DOB: 16-May-1966 Today's Date: 09/08/2020   History of Present Illness  54 yo admitted 12/29 with respiratory failure due to COPD exacerbation. PMHx: obesity, sleep apnea, COPD on 4L, HTN, L THA  Clinical Impression  Pt pleasant and willing to attempt mobility. Pt reports fatigue from being up in chair and returning to bed grossly 2hrs ago. Pt able to walk limited distance in room and about half of what her baseline is. Pt on 4L throughout with SpO2 88-97% with limited activity. Pt able to recover to mid 90s after gait with 30 sec seated rest but required increased time prior to being able to pivot back into bed. PT denied further activity or HEP and refused BSC for use during the day despite education of need to use and avoid purewick as this will not be an option at home. Pt with progressive decline in function for 2years with acute worsening with COPD exacerbation. Pt with decreased gait, transfers and functional mobility who will benefit from acute therapy to maximize independence.    HR 102-127   Follow Up Recommendations Home health PT    Equipment Recommendations  None recommended by PT    Recommendations for Other Services       Precautions / Restrictions Precautions Precautions: Fall;Other (comment) Precaution Comments: watch sats      Mobility  Bed Mobility Overal bed mobility: Modified Independent       Supine to sit: HOB elevated     General bed mobility comments: pt able to use rail to pivot to EOb with HOB 50degrees, sleeps in recliner at home    Transfers Overall transfer level: Needs assistance   Transfers: Sit to/from Stand Sit to Stand: Supervision         General transfer comment: supervison for lines and safety, good hand placement  Ambulation/Gait Ambulation/Gait assistance: Min guard Gait Distance (Feet): 24 Feet Assistive device: Rolling walker (2  wheeled) Gait Pattern/deviations: Step-through pattern;Decreased stride length;Trunk flexed     General Gait Details: cues for posture and guarding for lines/safety  Stairs            Wheelchair Mobility    Modified Rankin (Stroke Patients Only)       Balance Overall balance assessment: Needs assistance Sitting-balance support: No upper extremity supported;Feet supported Sitting balance-Leahy Scale: Good     Standing balance support: During functional activity;Bilateral upper extremity supported Standing balance-Leahy Scale: Poor Standing balance comment: Reliant on UE support                             Pertinent Vitals/Pain Pain Assessment: 0-10 Pain Score: 7  Pain Location: LUE Pain Descriptors / Indicators: Aching Pain Intervention(s): Repositioned;Monitored during session    Home Living Family/patient expects to be discharged to:: Private residence Living Arrangements: Spouse/significant other Available Help at Discharge: Family;Available 24 hours/day Type of Home: House Home Access: Stairs to enter Entrance Stairs-Rails: Can reach both;Left;Right Entrance Stairs-Number of Steps: 1 Home Layout: Two level;Able to live on main level with bedroom/bathroom (Has been living on main floor; but bath upstairs) Home Equipment: Walker - 2 wheels;Shower seat;Adaptive equipment;Hand held shower head;Walker - 4 wheels;Bedside commode;Wheelchair - manual Additional Comments: home O2 at 4L    Prior Function Level of Independence: Needs assistance   Gait / Transfers Assistance Needed: Pt reports she sometimes uses RW but mainly will hold onto walls in home, max 50' at  a time  ADL's / Homemaking Assistance Needed: Assistance for showers and LB dressing tasks as needed, sleeps in a recliner        Hand Dominance   Dominant Hand: Right    Extremity/Trunk Assessment   Upper Extremity Assessment Upper Extremity Assessment: Generalized weakness    Lower  Extremity Assessment Lower Extremity Assessment: Generalized weakness    Cervical / Trunk Assessment Cervical / Trunk Exceptions: Increased body habitus  Communication   Communication: No difficulties  Cognition Arousal/Alertness: Awake/alert Behavior During Therapy: WFL for tasks assessed/performed Overall Cognitive Status: Within Functional Limits for tasks assessed                                        General Comments      Exercises     Assessment/Plan    PT Assessment Patient needs continued PT services  PT Problem List Decreased mobility;Decreased activity tolerance;Decreased balance       PT Treatment Interventions DME instruction;Therapeutic exercise;Gait training;Functional mobility training;Stair training;Therapeutic activities;Patient/family education    PT Goals (Current goals can be found in the Care Plan section)  Acute Rehab PT Goals Patient Stated Goal: return home PT Goal Formulation: With patient Time For Goal Achievement: 09/22/20 Potential to Achieve Goals: Fair    Frequency Min 3X/week   Barriers to discharge        Co-evaluation               AM-PAC PT "6 Clicks" Mobility  Outcome Measure Help needed turning from your back to your side while in a flat bed without using bedrails?: A Little Help needed moving from lying on your back to sitting on the side of a flat bed without using bedrails?: A Little Help needed moving to and from a bed to a chair (including a wheelchair)?: A Little Help needed standing up from a chair using your arms (e.g., wheelchair or bedside chair)?: A Little Help needed to walk in hospital room?: A Little Help needed climbing 3-5 steps with a railing? : A Lot 6 Click Score: 17    End of Session   Activity Tolerance: Patient limited by fatigue Patient left: in bed;with call bell/phone within reach Nurse Communication: Mobility status PT Visit Diagnosis: Other abnormalities of gait and  mobility (R26.89);Difficulty in walking, not elsewhere classified (R26.2)    Time: 8101-7510 PT Time Calculation (min) (ACUTE ONLY): 26 min   Charges:   PT Evaluation $PT Eval Moderate Complexity: 1 Mod          Rashia Mckesson P, PT Acute Rehabilitation Services Pager: [phone] Office: [phone]   Journi Moffa B [place] 09/08/2020, 3:09 PM

## 2020-09-08 NOTE — Evaluation (Signed)
Occupational Therapy Evaluation Patient Details Name: [name] MRN: [id number] DOB: Jun 08, 1966 Today's Date: 09/08/2020    History of Present Illness 54 yo admitted 12/29 with respiratory failure due to COPD exacerbation. PMHx: obesity, sleep apnea, COPD on 4L, HTN, L THA   Clinical Impression   PTA, pt was living with her husband and required assistance for LB ADLs and used RW occasionally; typically holds the wall for support in walking around home. Pt currently requiring Max A for LB ADLs and Min Guard A for functional transfers with RW. Pt with incontinence of bowel in bed; reports she was unaware and requiring Max A for peri care. Pt presenting with decreased activity tolerance and requiring several seated rest breaks. Pt would benefit from further acute OT to facilitate safe dc. Recommend dc to home with HHOT for further OT to optimize safety, independence with ADLs, and return to PLOF.     Follow Up Recommendations  Home health OT;Supervision/Assistance - 24 hour    Equipment Recommendations  None recommended by OT    Recommendations for Other Services PT consult     Precautions / Restrictions Precautions Precautions: Fall;Other (comment) (Watch SPO2; increased rest breaks)      Mobility Bed Mobility Overal bed mobility: Needs Assistance Bed Mobility: Supine to Sit     Supine to sit: Min guard;HOB elevated     General bed mobility comments: Requesting HOB be elevated as far as possible. Min Guard A for safety    Transfers Overall transfer level: Needs assistance Equipment used: Rolling walker (2 wheeled) Transfers: Sit to/from [employer] to Stand: Min guard Stand pivot transfers: Min guard       General transfer comment: MIn Guard A for safety with sit<>Stand and then pivot to recliner    Balance Overall balance assessment: Needs assistance Sitting-balance support: No upper extremity supported;Feet supported Sitting balance-Leahy  Scale: Fair     Standing balance support: During functional activity;Bilateral upper extremity supported Standing balance-Leahy Scale: Poor Standing balance comment: Reliant on UE support                           ADL either performed or assessed with clinical judgement   ADL Overall ADL's : Needs assistance/impaired Eating/Feeding: Set up;Sitting   Grooming: Wash/dry face;Set up;Sitting   Upper Body Bathing: Minimal assistance;Sitting   Lower Body Bathing: Maximal assistance;Sit to/from stand   Upper Body Dressing : Minimal assistance;Sitting   Lower Body Dressing: Maximal assistance;Sit to/from stand Lower Body Dressing Details (indicate cue type and reason): don shoes Toilet Transfer: Min guard;Stand-pivot;RW (simulated to recliner)   Toileting- Clothing Manipulation and Hygiene: Maximal assistance;Sit to/from stand Toileting - Clothing Manipulation Details (indicate cue type and reason): Pt able to perform sit<>stand with Min Guard A. Max A for peri care after bowel incontinence in bed. Pt reporting she was not aware of BM in bed.     Functional mobility during ADLs: Min guard;Rolling walker (stand pivot) General ADL Comments: Pt presenting with decreased activity tolerance and requiring several seated rest breaks during simple tasks.     Vision         Perception     Praxis      Pertinent Vitals/Pain Pain Assessment: Faces Faces Pain Scale: Hurts little more Pain Location: HA Pain Descriptors / Indicators: Discomfort;Grimacing Pain Intervention(s): Monitored during session;Repositioned     Hand Dominance Right   Extremity/Trunk Assessment Upper Extremity Assessment Upper Extremity Assessment: LUE deficits/detail LUE  Deficits / Details: pain with L shoulder in movement due to prior insury and surgery   Lower Extremity Assessment Lower Extremity Assessment: Defer to PT evaluation   Cervical / Trunk Assessment Cervical / Trunk Assessment: Other  exceptions Cervical / Trunk Exceptions: Increased body habitus   Communication Communication Communication: No difficulties   Cognition Arousal/Alertness: Awake/alert Behavior During Therapy: WFL for tasks assessed/performed Overall Cognitive Status: Within Functional Limits for tasks assessed                                 General Comments: Slight anxiety with SOB but demonstrating good control to calmly take breathes   General Comments  Upon arrival, SpO2 99% on 2L; pt normally on 4L at home. Pt performing bed mobility, sit<>Stand for peri care, and stand pivot to recliner with Spo2 in 90s on 4L. For majority of session, Spo2 100-98% on 4L. Once Spo2 dropping to 92% after pivot to reclienr    Exercises     Shoulder Instructions      Home Living Family/patient expects to be discharged to:: Private residence Living Arrangements: Spouse/significant other Available Help at Discharge: Family;Available 24 hours/day Type of Home: House Home Access: Stairs to enter [employer] of Steps: 1 Entrance Stairs-Rails: Can reach both;Left;Right Home Layout: Two level;Able to live on main level with bedroom/bathroom (Has been living on main floor; but bath upstairs)     Bathroom Shower/Tub: Walk-in shower (sponge bathes to stay on first floor)   Bathroom Toilet: Standard     Home Equipment: Environmental consultant - 2 wheels;Shower seat;Adaptive equipment;Hand held shower head;Walker - 4 wheels;Bedside commode;Wheelchair - Radiographer, therapeutic Comments: home O2 at 4L      Prior Functioning/Environment Level of Independence: Needs assistance  Gait / Transfers Assistance Needed: Pt reports she sometimes uses RW but mainly will hold onto walls in home ADL's / Homemaking Assistance Needed: Assistance for showers and LB dressing tasks as needed            OT Problem List: Decreased strength;Decreased range of motion;Decreased activity  tolerance;Impaired balance (sitting and/or standing);Decreased safety awareness;Decreased knowledge of precautions;Decreased knowledge of use of DME or AE;Pain      OT Treatment/Interventions: Self-care/ADL training;Therapeutic exercise;Energy conservation;DME and/or AE instruction;Therapeutic activities;Patient/family education    OT Goals(Current goals can be found in the care plan section) Acute Rehab OT Goals Patient Stated Goal: Go home OT Goal Formulation: With patient Time For Goal Achievement: 09/22/20 Potential to Achieve Goals: Good  OT Frequency: Min 2X/week   Barriers to D/C:            Co-evaluation              AM-PAC OT "6 Clicks" Daily Activity     Outcome Measure Help from another person eating meals?: None Help from another person taking care of personal grooming?: A Little Help from another person toileting, which includes using toliet, bedpan, or urinal?: A Little Help from another person bathing (including washing, rinsing, drying)?: A Lot Help from another person to put on and taking off regular upper body clothing?: A Little Help from another person to put on and taking off regular lower body clothing?: A Lot 6 Click Score: 17   End of Session Equipment Utilized During Treatment: Oxygen;Rolling walker (4L) Nurse Communication: Mobility status  Activity Tolerance: Patient tolerated treatment well Patient left: in chair;with call bell/phone within reach;with nursing/sitter in room  OT Visit  Diagnosis: Unsteadiness on feet (R26.81);Other abnormalities of gait and mobility (R26.89);Muscle weakness (generalized) (M62.81);Pain Pain - part of body:  (HA)                Time: 0109-3235 OT Time Calculation (min): 42 min Charges:  OT General Charges $OT Visit: 1 Visit OT Evaluation $OT Eval Moderate Complexity: 1 Mod OT Treatments $Self Care/Home Management : 23-37 mins  Ted Goodner MSOT, OTR/L Acute Rehab Pager: [phone] Office:  [phone]  Theodoro Grist Phenix Vandermeulen 09/08/2020, 10:05 AM

## 2020-09-08 NOTE — Progress Notes (Signed)
PT Cancellation Note  Patient Details Name: [name] MRN: [id number] DOB: 1965/09/13   Cancelled Treatment:    Reason Eval/Treat Not Completed: Patient declined, no reason specified (Pt just up to chair with OT and declined any further activity at this time)   Algis Lehenbauer B [name] 09/08/2020, 9:28 AM  Merryl Hacker, PT Acute Rehabilitation Services Pager: [phone] Office: [phone]

## 2020-09-09 DIAGNOSIS — J441 Chronic obstructive pulmonary disease with (acute) exacerbation: Secondary | ICD-10-CM | POA: Diagnosis not present

## 2020-09-09 NOTE — Progress Notes (Signed)
Pt placed on cpap 

## 2020-09-09 NOTE — Progress Notes (Signed)
OT Cancellation Note  Patient Details Name: [name] MRN: [id number] DOB: 1966/02/25   Cancelled Treatment:    Reason Eval/Treat Not Completed: Other (comment) pt receiving breathing treatment upon OTA arrival and requesting to eat breakfast after treatment, will check back as time allows for OT session.   Lenor Derrick., COTA/L Acute Rehabilitation Services [phone]   Barron Schmid 09/09/2020, 10:06 AM

## 2020-09-09 NOTE — Progress Notes (Signed)
Subjective:   Hospital day: 3  Overnight event: Patient used CPAP overnight.  Resting comfortably in bed recently had her CPAP taken off.  She appears tired and short of breath on 4-5  L nasal cannula.  Patient admits to shortness of breath but denies any new symptoms today.  She admits to being eager to get home.  Counseled her on the importance of staying for another night or 2 to make sure that the steroids are working and that she is able to get down to her home oxygen requirement of 4 L consistently.  Patient's daughter in the room and she agrees with these recommendations as well.  Objective:  Vital signs in last 24 hours: Vitals:   09/08/20 2112 09/08/20 2116 09/09/20 0252 09/09/20 0530  BP: (!) 147/90   137/85  Pulse: 100 100 99 86  Resp: 20 20 20 20   Temp: 98 F (36.7 C)   97.9 F (36.6 C)  TempSrc:    Axillary  SpO2: 94% 97% 95% 100%  Weight:        Physical Exam General: Appears weak and generally unwell but improving CV: Patient is tachycardic with regular rhythm no murmurs rubs or gallops trace lower extremity edema we will Pulmonary: She has mild expiratory wheezing with decreased breath sounds in the bilateral bases with increased effort Abdominal: Soft, nontender, normal bowel sounds Extremities: Warm and dry with a ulcer on the left distal shin on the lateral aspect covered with bandage Neuro: Neurovascularly intact.  Assessment/Plan: [name] is a 55 y.o. female with PMHx of COPD on chronic prednisone, azithromycin and on 4 L O2 home, HTN, HLD, chronic pain on Norco and morphine, tobacco use, OSAnonadherent toCPAP, who presented with worsening of shortness of breath, weakness and found to have COPD exacerbation requiring BiPAP.  Now off BiPAP satting well and home oxygen.  Principal Problem:   COPD exacerbation (HCC) Active Problems:   Obesity due to excess calories   Aortic stenosis   Acute on chronic heart failure with preserved ejection  fraction (HFpEF) (HCC)  Acute on chronic respiratory failure Obesity hypoventilation syndrome COPD (GOLD D) exacerbation  Patient is making significant improvements on current therapy.  She tolerated CPAP overnight.  She states that she was able to ambulate to her side chair yesterday and will continue to work on that today.  We will hold off discharge until she is able to ambulate comfortably on 4 L.  -Continue prednisone 40 mg daily.  We will need a 5 to 7-day course -Continue home azithromycin 250 mg daily -Continue home Singulair, Pulmicort, nebulizer -Continue supplemental oxygen with nasal cannula to keep oxygen saturations near 90% -CPAP at night -Continue PT/OT as tolerated, recommend home health PT  Hypervolemia: Aortic stenosis Patient has some evidence of hypervolemia that is improved since her admission.  Echo showed EF of 65 to 70% with normal left ventricular function no regional wall motion abnormalities no right sided heart failure noted.  However it did show severe aortic stenosis.  This will need to be followed up in the outpatient setting for possible aortic valve replacement.  -Patient still has some mild lower extremity edema and therefore we will continue Lasix 20 mg daily -await fluid status daily -Work-up for stenosis  Left lower extremity swelling: Venous insufficiency Patient likely has some venous insufficiency which is evident by some lower extremity venous stasis changes in her skin.  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 -Continue wound care daily -Continue compression wraps on the right leg -Once wound is healed she will need bilateral compression wraps.  Chronic pain Patient restarted on home pain medications.  -Continue Norco 10-3 25 every 6 hours, MS Contin 30 mg every 12 hours, gabapentin 300 mg 3 times daily.  HLD Patient has no previous history of CAD or PAD of note.  Unable perform ABI of her left lower  extremity due to wound right lower extremity was within normal limits.  However, she does have an ASCVD of at least 6.6% and a lipid panel showing an LDL of 113.  Therefore she would likely benefit from statin therapy for primary prevention of cardiovascular disease.  She was started on aspirin 81 mg by her PCP for primary prevention and was continued on it here in the hospital.  No evidence of obvious bleeds.   -Continue ASA daily -We will discuss initiating moderate intensity statin with patient prior to starting medication.  Diet: HH IVF: None VTE: Lovenox CODE: Full Code  Prior to Admission Living Arrangement: Home Anticipated Discharge Location: Home Barriers to Discharge: Medical work up Dispo: Anticipated discharge in approximately 1-2 day(s).   Signed: Chari Manning, D.O.  Internal Medicine Resident, PGY-2 [facility]  Pager: [phone] 12:07 PM, 09/09/2020   After 5pm on weekdays and 1pm on weekends: On Call pager: [phone]

## 2020-09-10 DIAGNOSIS — J441 Chronic obstructive pulmonary disease with (acute) exacerbation: Secondary | ICD-10-CM | POA: Diagnosis not present

## 2020-09-10 MED ORDER — PREDNISONE 20 MG PO TABS: 40.0000 mg | ORAL_TABLET | Freq: Every day | ORAL | 0 refills | Status: DC

## 2020-09-10 NOTE — Discharge Instructions (Signed)
To [name],   It was a pleasure taking care of you during your hospital stay. You were treated for a COPD exacerbation. We are discharging you with 40 mg (2 tablets) of Prednisone. Please take 2 tablets (40 mg) for the next 3 days, followed by 1 tablet (20 mg) for 5 more days. After completing the 5 day course, please continue your home dose of 10 mg daily. If you experiencing worsening symptoms, altered mental status, or acute decline in your state of health, please come back for evaluation.     Chronic Obstructive Pulmonary Disease Exacerbation  Chronic obstructive pulmonary disease (COPD) is a long-term (chronic) condition that affects the lungs. COPD is a general term that can be used to describe many different lung problems that cause lung swelling (inflammation) and limit airflow, including chronic bronchitis and emphysema. COPD exacerbations are episodes when breathing symptoms become much worse and require extra treatment. COPD exacerbations are usually caused by infections. Without treatment, COPD exacerbations can be severe and even life threatening. Frequent COPD exacerbations can cause further damage to the lungs. What are the causes? This condition may be caused by:  Respiratory infections, including viral and bacterial infections.  Exposure to smoke.  Exposure to air pollution, chemical fumes, or dust.  Things that give you an allergic reaction (allergens).  Not taking your usual COPD medicines as directed.  Underlying medical problems, such as congestive heart failure or infections not involving the lungs. In many cases, the cause (trigger) of this condition is not known. What increases the risk? The following factors may make you more likely to develop this condition:  Smoking cigarettes.  Old age.  Frequent prior COPD exacerbations. What are the signs or symptoms? Symptoms of this condition include:  Increased coughing.  Increased production of mucus from your  lungs (sputum).  Increased wheezing.  Increased shortness of breath.  Rapid or labored breathing.  Chest tightness.  Less energy than usual.  Sleep disruption from symptoms.  Confusion or increased sleepiness. Often these symptoms happen or get worse even with the use of medicines. How is this diagnosed? This condition is diagnosed based on:  Your medical history.  A physical exam. You may also have tests, including:  A chest X-ray.  Blood tests.  Lung (pulmonary) function tests. How is this treated? Treatment for this condition depends on the severity and cause of the symptoms. You may need to be admitted to a hospital for treatment. Some of the treatments commonly used to treat COPD exacerbations are:  Antibiotic medicines. These may be used for severe exacerbations caused by a lung infection, such as pneumonia.  Bronchodilators. These are inhaled medicines that expand the air passages and allow increased airflow.  Steroid medicines. These act to reduce inflammation in the airways. They may be given with an inhaler, taken by mouth, or given through an IV tube inserted into one of your veins.  Supplemental oxygen therapy.  Airway clearing techniques, such as noninvasive ventilation (NIV) and positive expiratory pressure (PEP). These provide respiratory support through a mask or other noninvasive device. An example of this would be using a continuous positive airway pressure (CPAP) machine to improve delivery of oxygen into your lungs. Follow these instructions at home: Medicines  Take over-the-counter and prescription medicines only as told by your health care provider. It is important to use correct technique with inhaled medicines.  If you were prescribed an antibiotic medicine or oral steroid, take it as told by your health care provider. Do  not stop taking the medicine even if you start to feel better. Lifestyle  Eat a healthy diet.  Exercise regularly.  Get  plenty of sleep.  Avoid exposure to all substances that irritate the airway, especially to tobacco smoke.  Wash your hands often with soap and water to reduce the risk of infection. If soap and water are not available, use hand sanitizer.  During flu season, avoid enclosed spaces that are crowded with people. General instructions  Drink enough fluid to keep your urine clear or pale yellow (unless you have a medical condition that requires fluid restriction).  Use a cool mist vaporizer. This humidifies the air and makes it easier for you to clear your chest when you cough.  If you have a home nebulizer and oxygen, continue to use them as told by your health care provider.  Keep all follow-up visits as told by your health care provider. This is important. How is this prevented?  Stay up-to-date on pneumococcal and influenza (flu) vaccines. A flu shot is recommended every year to help prevent exacerbations.  Do not use any products that contain nicotine or tobacco, such as cigarettes and e-cigarettes. Quitting smoking is very important in preventing COPD from getting worse and in preventing exacerbations from happening as often. If you need help quitting, ask your health care provider.  Follow all instructions for pulmonary rehabilitation after a recent exacerbation. This can help prevent future exacerbations.  Work with your health care provider to develop and follow an action plan. This tells you what steps to take when you experience certain symptoms. Contact a health care provider if:  You have a worsening of your regular COPD symptoms. Get help right away if:  You have worsening shortness of breath, even when resting.  You have trouble talking.  You have severe chest pain.  You cough up blood.  You have a fever.  You have weakness, vomit repeatedly, or faint.  You feel confused.  You are not able to sleep because of your symptoms.  You have trouble doing daily  activities. Summary  COPD exacerbations are episodes when breathing symptoms become much worse and require extra treatment above your normal treatment.  Exacerbations can be severe and even life threatening. Frequent COPD exacerbations can cause further damage to your lungs.  COPD exacerbations are usually triggered by infections such as the flu, colds, and even pneumonia.  Treatment for this condition depends on the severity and cause of the symptoms. You may need to be admitted to a hospital for treatment.  Quitting smoking is very important to prevent COPD from getting worse and to prevent exacerbations from happening as often. This information is not intended to replace advice given to you by your health care provider. Make sure you discuss any questions you have with your health care provider. Document Revised: 08/08/2017 Document Reviewed: 09/30/2016 Elsevier Patient Education  2020 [employer].

## 2020-09-10 NOTE — Progress Notes (Signed)
Subjective:   ON Events: None  Ms. Dierks was seen at bedside this AM. She states that she did well overnight. Her breathing is much improved and stable on 4L. We discussed her plans for discharge today, and she states that she is ready to go back home. All questions and concerns addressed at bedside.    Objective:  Vital signs in last 24 hours: Vitals:   09/09/20 1939 09/09/20 2015 09/10/20 0408 09/10/20 0529  BP: 128/85   135/87  Pulse: 97   99  Resp: 16   18  Temp: 97.7 F (36.5 C)   97.8 F (36.6 C)  TempSrc:      SpO2: 95% 100%  100%  Weight:   84.6 kg     Physical Exam Physical Exam Constitutional:      General: She is not in acute distress.    Appearance: Normal appearance. She is not toxic-appearing or diaphoretic.  Cardiovascular:     Rate and Rhythm: Normal rate and regular rhythm.     Pulses: Normal pulses.     Heart sounds: Normal heart sounds. No murmur heard. No friction rub. No gallop.   Neurological:     Mental Status: She is alert.     Assessment/Plan: DISHA COTTAM is a 55 y.o. female with PMHx of COPD on chronic prednisone, azithromycin and on 4 L O2 home, HTN, HLD, chronic pain on Norco and morphine, tobacco use, OSAnonadherent toCPAP, who presented with worsening of shortness of breath, weakness and found to have COPD exacerbation requiring BiPAP. Now off BiPAP satting well and home oxygen.  Principal Problem:   COPD exacerbation (HCC) Active Problems:   Obesity due to excess calories   Aortic stenosis   Acute on chronic heart failure with preserved ejection fraction (HFpEF) (HCC)  Acute on chronic respiratory failure Obesity hypoventilation syndrome COPD (GOLD D) exacerbation  Patient did well overnight. Currently saturating at 100% O2 on 4L. Plan for discharge today with a taper on her prednisone.  -Continue prednisone 40 mg daily, day 4/7. Complete 7 day course of 40 mg prednisone followed by 5 day course of 20 mg prednisone then  continuing home 10 mg.  -Continue home azithromycin 250 mg daily -Continue home Singulair, Pulmicort, nebulizer -Continue supplemental oxygen with nasal cannula to keep oxygen saturations near 90% -CPAP at night - PT recommend HH PT.  Hypervolemia: Aortic stenosis  Echo showed EF of 65 to 70% with normal left ventricular function no regional wall motion abnormalities no right sided heart failure noted. Does show severe aortic stenosis, which will  will need to be followed up in the outpatient setting for possible aortic valve replacement.  -Continue Lasix 20 mg daily -await fluid status daily -Work-up for stenosis in outpatient setting.  Left lower extremity swelling: Venous insufficiency Patient likely has some venous insufficiency which is evident by some lower extremity venous stasis changes in her skin.  She does have a chronic ulcer on the left lateral aspect of her distal leg. -Continue wound care daily -Continue compression wraps on the right leg -Once wound is healed she will need bilateral compression wraps.  Chronic pain: -Continue Norco 10-3 25 every 6 hours, MS Contin 30 mg every 12 hours, gabapentin 300 mg 3 times daily.  HLD Patient has no previous history of CAD or PAD of note.  Unable perform ABI of her left lower extremity due to wound right lower extremity was within normal limits.  However, she does have an ASCVD of at  least 6.6% and a lipid panel showing an LDL of 113.  Therefore she would likely benefit from statin therapy for primary prevention of cardiovascular disease.  She was started on aspirin 81 mg by her PCP for primary prevention and was continued on it here in the hospital.  No evidence of obvious bleeds.   -Continue ASA daily -We will discuss initiating moderate intensity statin with patient prior to starting medication.  Diet: HH IVF: None VTE: Lovenox CODE: Full Code  Prior to Admission Living Arrangement: Home Anticipated Discharge Location:  Home Barriers to Discharge: Medical work up Dispo: Anticipated discharge in approximately 0-1(s).   Signed: Dolan Amen, MD Internal Medicine Resident, PGY-2 [facility]  Pager: [phone] 6:06 AM, 09/10/2020   After 5pm on weekdays and 1pm on weekends: On Call pager: [phone]

## 2020-09-10 NOTE — TOC Transition Note (Signed)
Transition of Care [facility]) - CM/SW Discharge Note   Patient Details  Name: [name] MRN: [id number] Date of Birth: May 29, 1966  Transition of [facility] CM/SW Contact:  Lawerance Sabal, RN Phone Number: 09/10/2020, 2:10 PM   Clinical Narrative:   Spoke w patient, she is active with [facility] for RN. She denies needs for DME, already has home oxygen with [employer] and her spouse will be transporting her home, she states that she has oxygen for the ride home.  [facility] notified of DC today w orders for PT OT RN.  No other CM needs identified.     Final next level of care: Home w Home Health Services Barriers to Discharge: No Barriers Identified   Patient Goals and CMS Choice Patient states their goals for this hospitalization and ongoing recovery are:: to go home CMS Medicare.gov Compare Post Acute Care list provided to:: Patient Choice offered to / list presented to : Patient  Discharge Placement                       Discharge Plan and Services                DME Arranged: N/A         HH Arranged: RN,PT,OT HH Agency: [facility] Date HH Agency Contacted: 09/10/20 Time HH Agency Contacted: 1410 Representative spoke with at [facility]: Barbara Cower  Social Determinants of Health (SDOH) Interventions     Readmission Risk Interventions No flowsheet data found.

## 2020-09-10 NOTE — Plan of Care (Signed)

## 2020-09-11 LAB — CULTURE, BLOOD (ROUTINE X 2)
Culture: NO GROWTH
Culture: NO GROWTH
Special Requests: ADEQUATE
Special Requests: ADEQUATE

## 2020-09-26 ENCOUNTER — Ambulatory Visit: Payer: Medicare Other | Admitting: Neurology

## 2020-11-11 ENCOUNTER — Inpatient Hospital Stay (HOSPITAL_COMMUNITY): Payer: Medicare Other

## 2020-11-11 ENCOUNTER — Emergency Department (HOSPITAL_COMMUNITY): Payer: Medicare Other

## 2020-11-11 ENCOUNTER — Inpatient Hospital Stay (HOSPITAL_COMMUNITY)
Admission: EM | Admit: 2020-11-11 | Discharge: 2020-11-20 | DRG: 177 | Disposition: A | Discharge status: Home / Self Care | Payer: Medicare Other | Attending: Internal Medicine | Admitting: Internal Medicine

## 2020-11-11 ENCOUNTER — Encounter (HOSPITAL_COMMUNITY): Payer: Self-pay | Admitting: Emergency Medicine

## 2020-11-11 ENCOUNTER — Other Ambulatory Visit: Payer: Self-pay

## 2020-11-11 DIAGNOSIS — Z801 Family history of malignant neoplasm of trachea, bronchus and lung: Secondary | ICD-10-CM

## 2020-11-11 DIAGNOSIS — Z882 Allergy status to sulfonamides status: Secondary | ICD-10-CM | POA: Diagnosis not present

## 2020-11-11 DIAGNOSIS — F419 Anxiety disorder, unspecified: Secondary | ICD-10-CM | POA: Diagnosis present

## 2020-11-11 DIAGNOSIS — U071 COVID-19: Secondary | ICD-10-CM | POA: Diagnosis present

## 2020-11-11 DIAGNOSIS — Z880 Allergy status to penicillin: Secondary | ICD-10-CM

## 2020-11-11 DIAGNOSIS — Z96642 Presence of left artificial hip joint: Secondary | ICD-10-CM | POA: Diagnosis present

## 2020-11-11 DIAGNOSIS — F17210 Nicotine dependence, cigarettes, uncomplicated: Secondary | ICD-10-CM | POA: Diagnosis present

## 2020-11-11 DIAGNOSIS — Z7982 Long term (current) use of aspirin: Secondary | ICD-10-CM

## 2020-11-11 DIAGNOSIS — K219 Gastro-esophageal reflux disease without esophagitis: Secondary | ICD-10-CM | POA: Diagnosis present

## 2020-11-11 DIAGNOSIS — J1282 Pneumonia due to coronavirus disease 2019: Secondary | ICD-10-CM | POA: Diagnosis present

## 2020-11-11 DIAGNOSIS — Z888 Allergy status to other drugs, medicaments and biological substances status: Secondary | ICD-10-CM

## 2020-11-11 DIAGNOSIS — Z825 Family history of asthma and other chronic lower respiratory diseases: Secondary | ICD-10-CM

## 2020-11-11 DIAGNOSIS — G4733 Obstructive sleep apnea (adult) (pediatric): Secondary | ICD-10-CM | POA: Diagnosis present

## 2020-11-11 DIAGNOSIS — Z8261 Family history of arthritis: Secondary | ICD-10-CM

## 2020-11-11 DIAGNOSIS — J9600 Acute respiratory failure, unspecified whether with hypoxia or hypercapnia: Secondary | ICD-10-CM | POA: Diagnosis not present

## 2020-11-11 DIAGNOSIS — R0602 Shortness of breath: Secondary | ICD-10-CM

## 2020-11-11 DIAGNOSIS — J440 Chronic obstructive pulmonary disease with acute lower respiratory infection: Secondary | ICD-10-CM | POA: Diagnosis present

## 2020-11-11 DIAGNOSIS — Z9981 Dependence on supplemental oxygen: Secondary | ICD-10-CM

## 2020-11-11 DIAGNOSIS — J9602 Acute respiratory failure with hypercapnia: Secondary | ICD-10-CM | POA: Diagnosis not present

## 2020-11-11 DIAGNOSIS — R0603 Acute respiratory distress: Secondary | ICD-10-CM | POA: Diagnosis present

## 2020-11-11 DIAGNOSIS — J441 Chronic obstructive pulmonary disease with (acute) exacerbation: Secondary | ICD-10-CM | POA: Diagnosis present

## 2020-11-11 DIAGNOSIS — Z881 Allergy status to other antibiotic agents status: Secondary | ICD-10-CM

## 2020-11-11 DIAGNOSIS — G4730 Sleep apnea, unspecified: Secondary | ICD-10-CM | POA: Diagnosis present

## 2020-11-11 DIAGNOSIS — I110 Hypertensive heart disease with heart failure: Secondary | ICD-10-CM | POA: Diagnosis present

## 2020-11-11 DIAGNOSIS — G4700 Insomnia, unspecified: Secondary | ICD-10-CM | POA: Diagnosis not present

## 2020-11-11 DIAGNOSIS — I5032 Chronic diastolic (congestive) heart failure: Secondary | ICD-10-CM | POA: Diagnosis present

## 2020-11-11 DIAGNOSIS — J9621 Acute and chronic respiratory failure with hypoxia: Secondary | ICD-10-CM | POA: Diagnosis present

## 2020-11-11 DIAGNOSIS — Z79891 Long term (current) use of opiate analgesic: Secondary | ICD-10-CM

## 2020-11-11 DIAGNOSIS — D72829 Elevated white blood cell count, unspecified: Secondary | ICD-10-CM | POA: Diagnosis not present

## 2020-11-11 DIAGNOSIS — J9622 Acute and chronic respiratory failure with hypercapnia: Secondary | ICD-10-CM | POA: Diagnosis present

## 2020-11-11 DIAGNOSIS — Z79899 Other long term (current) drug therapy: Secondary | ICD-10-CM

## 2020-11-11 DIAGNOSIS — T380X5A Adverse effect of glucocorticoids and synthetic analogues, initial encounter: Secondary | ICD-10-CM | POA: Diagnosis not present

## 2020-11-11 DIAGNOSIS — Z66 Do not resuscitate: Secondary | ICD-10-CM | POA: Diagnosis not present

## 2020-11-11 DIAGNOSIS — J9601 Acute respiratory failure with hypoxia: Secondary | ICD-10-CM | POA: Diagnosis not present

## 2020-11-11 HISTORY — DX: Heart failure, unspecified: I50.9

## 2020-11-11 LAB — CBC WITH DIFFERENTIAL/PLATELET
Abs Immature Granulocytes: 0.43 10*3/uL — ABNORMAL HIGH (ref 0.00–0.07)
Basophils Absolute: 0.1 10*3/uL (ref 0.0–0.1)
Basophils Relative: 1 %
Eosinophils Absolute: 0 10*3/uL (ref 0.0–0.5)
Eosinophils Relative: 0 %
HCT: 43.9 % (ref 36.0–46.0)
Hemoglobin: 12.9 g/dL (ref 12.0–15.0)
Immature Granulocytes: 2 %
Lymphocytes Relative: 8 %
Lymphs Abs: 1.5 10*3/uL (ref 0.7–4.0)
MCH: 26.2 pg (ref 26.0–34.0)
MCHC: 29.4 g/dL — ABNORMAL LOW (ref 30.0–36.0)
MCV: 89.2 fL (ref 80.0–100.0)
Monocytes Absolute: 1.2 10*3/uL — ABNORMAL HIGH (ref 0.1–1.0)
Monocytes Relative: 7 %
Neutro Abs: 15.3 10*3/uL — ABNORMAL HIGH (ref 1.7–7.7)
Neutrophils Relative %: 82 %
Platelets: 222 10*3/uL (ref 150–400)
RBC: 4.92 MIL/uL (ref 3.87–5.11)
RDW: 15.7 % — ABNORMAL HIGH (ref 11.5–15.5)
WBC: 18.6 10*3/uL — ABNORMAL HIGH (ref 4.0–10.5)
nRBC: 0 % (ref 0.0–0.2)

## 2020-11-11 LAB — BASIC METABOLIC PANEL
Anion gap: 9 (ref 5–15)
BUN: 13 mg/dL (ref 6–20)
CO2: 34 mmol/L — ABNORMAL HIGH (ref 22–32)
Calcium: 8.1 mg/dL — ABNORMAL LOW (ref 8.9–10.3)
Chloride: 95 mmol/L — ABNORMAL LOW (ref 98–111)
Creatinine, Ser: 0.58 mg/dL (ref 0.44–1.00)
GFR, Estimated: >60 mL/min (ref >60)
Glucose, Bld: 73 mg/dL (ref 70–99)
Potassium: 3.9 mmol/L (ref 3.5–5.1)
Sodium: 138 mmol/L (ref 135–145)

## 2020-11-11 LAB — BLOOD GAS, ARTERIAL
Acid-Base Excess: 10.7 mmol/L — ABNORMAL HIGH (ref 0.0–2.0)
Bicarbonate: 38.9 mmol/L — ABNORMAL HIGH (ref 20.0–28.0)
FIO2: 50
O2 Saturation: 99.1 %
Patient temperature: 98.6
pCO2 arterial: 73.1 mmHg (ref 32.0–48.0)
pH, Arterial: 7.345 — ABNORMAL LOW (ref 7.350–7.450)
pO2, Arterial: 152 mmHg — ABNORMAL HIGH (ref 83.0–108.0)

## 2020-11-11 LAB — GLUCOSE, CAPILLARY
Glucose-Capillary: 221 mg/dL — ABNORMAL HIGH (ref 70–99)
Glucose-Capillary: 207 mg/dL — ABNORMAL HIGH (ref 70–99)

## 2020-11-11 LAB — RESP PANEL BY RT-PCR (FLU A&B, COVID) ARPGX2
Influenza A by PCR: NEGATIVE
Influenza B by PCR: NEGATIVE
SARS Coronavirus 2 by RT PCR: POSITIVE — AB

## 2020-11-11 LAB — BRAIN NATRIURETIC PEPTIDE: B Natriuretic Peptide: 102 pg/mL — ABNORMAL HIGH (ref 0.0–100.0)

## 2020-11-11 LAB — PROCALCITONIN: Procalcitonin: <0.1 ng/mL

## 2020-11-11 LAB — MRSA PCR SCREENING: MRSA by PCR: NEGATIVE

## 2020-11-11 MED ORDER — PREDNISONE 50 MG PO TABS
50.0000 mg | ORAL_TABLET | Freq: Every day | ORAL | Status: DC
  Administered 2020-11-15 – 2020-11-16 (×2): 50 mg via ORAL
  Filled 2020-11-11 (×2): qty 1

## 2020-11-11 MED ORDER — SODIUM CHLORIDE 0.9 % IV SOLN: 200.0000 mg | Freq: Once | INTRAVENOUS | Status: DC

## 2020-11-11 MED ORDER — SODIUM CHLORIDE 0.9 % IV SOLN
500.0000 mg | INTRAVENOUS | Status: DC
  Administered 2020-11-11 – 2020-11-14 (×4): 500 mg via INTRAVENOUS
  Filled 2020-11-11 (×4): qty 500

## 2020-11-11 MED ORDER — CHLORHEXIDINE GLUCONATE CLOTH 2 % EX PADS
6.0000 | MEDICATED_PAD | Freq: Every day | CUTANEOUS | Status: DC
  Administered 2020-11-11 – 2020-11-20 (×9): 6 via TOPICAL

## 2020-11-11 MED ORDER — IPRATROPIUM-ALBUTEROL 20-100 MCG/ACT IN AERS
1.0000 | INHALATION_SPRAY | Freq: Four times a day (QID) | RESPIRATORY_TRACT | Status: DC
  Administered 2020-11-12 – 2020-11-18 (×20): 1 via RESPIRATORY_TRACT
  Filled 2020-11-11 (×2): qty 4

## 2020-11-11 MED ORDER — METHYLPREDNISOLONE SODIUM SUCC 125 MG IJ SOLR
125.0000 mg | Freq: Once | INTRAMUSCULAR | Status: AC
  Administered 2020-11-11: 125 mg via INTRAVENOUS
  Filled 2020-11-11: qty 2

## 2020-11-11 MED ORDER — ONDANSETRON HCL 4 MG PO TABS: 4.0000 mg | ORAL_TABLET | Freq: Four times a day (QID) | ORAL | Status: DC | PRN

## 2020-11-11 MED ORDER — GUAIFENESIN-DM 100-10 MG/5ML PO SYRP: 10.0000 mL | ORAL_SOLUTION | ORAL | Status: DC | PRN

## 2020-11-11 MED ORDER — ONDANSETRON HCL 4 MG/2ML IJ SOLN: 4.0000 mg | Freq: Four times a day (QID) | INTRAMUSCULAR | Status: DC | PRN

## 2020-11-11 MED ORDER — SODIUM CHLORIDE 0.9% FLUSH
10.0000 mL | Freq: Two times a day (BID) | INTRAVENOUS | Status: DC
  Administered 2020-11-11 – 2020-11-20 (×13): 10 mL

## 2020-11-11 MED ORDER — SODIUM CHLORIDE 0.9 % IV SOLN: INTRAVENOUS | Status: DC

## 2020-11-11 MED ORDER — ALBUTEROL (5 MG/ML) CONTINUOUS INHALATION SOLN
10.0000 mg/h | INHALATION_SOLUTION | Freq: Once | RESPIRATORY_TRACT | Status: AC
  Administered 2020-11-11: 10 mg/h via RESPIRATORY_TRACT
  Filled 2020-11-11: qty 20

## 2020-11-11 MED ORDER — ACETAMINOPHEN 325 MG PO TABS
650.0000 mg | ORAL_TABLET | Freq: Four times a day (QID) | ORAL | Status: DC | PRN
  Filled 2020-11-11: qty 2

## 2020-11-11 MED ORDER — SODIUM CHLORIDE 0.9 % IV SOLN: 100.0000 mg | Freq: Every day | INTRAVENOUS | Status: DC

## 2020-11-11 MED ORDER — ASCORBIC ACID 500 MG PO TABS
500.0000 mg | ORAL_TABLET | Freq: Every day | ORAL | Status: DC
  Administered 2020-11-14 – 2020-11-20 (×7): 500 mg via ORAL
  Filled 2020-11-11 (×9): qty 1

## 2020-11-11 MED ORDER — TOCILIZUMAB 400 MG/20ML IV SOLN
8.0000 mg/kg | Freq: Once | INTRAVENOUS | Status: AC
  Administered 2020-11-11: 676 mg via INTRAVENOUS
  Filled 2020-11-11: qty 33.8

## 2020-11-11 MED ORDER — METHYLPREDNISOLONE SODIUM SUCC 125 MG IJ SOLR
80.0000 mg | Freq: Two times a day (BID) | INTRAMUSCULAR | Status: AC
  Administered 2020-11-12 – 2020-11-14 (×6): 80 mg via INTRAVENOUS
  Filled 2020-11-11 (×5): qty 2

## 2020-11-11 MED ORDER — ORAL CARE MOUTH RINSE
15.0000 mL | Freq: Two times a day (BID) | OROMUCOSAL | Status: DC
  Administered 2020-11-11 – 2020-11-18 (×13): 15 mL via OROMUCOSAL

## 2020-11-11 MED ORDER — HYDROCOD POLST-CPM POLST ER 10-8 MG/5ML PO SUER: 5.0000 mL | Freq: Two times a day (BID) | ORAL | Status: DC | PRN

## 2020-11-11 MED ORDER — SODIUM CHLORIDE 0.9 % IV SOLN
2.0000 g | INTRAVENOUS | Status: DC
  Administered 2020-11-11 – 2020-11-14 (×4): 2 g via INTRAVENOUS
  Filled 2020-11-11 (×4): qty 20

## 2020-11-11 MED ORDER — ENOXAPARIN SODIUM 40 MG/0.4ML ~~LOC~~ SOLN
40.0000 mg | SUBCUTANEOUS | Status: DC
  Administered 2020-11-11 – 2020-11-19 (×9): 40 mg via SUBCUTANEOUS
  Filled 2020-11-11 (×9): qty 0.4

## 2020-11-11 MED ORDER — SODIUM CHLORIDE 0.9% FLUSH
10.0000 mL | INTRAVENOUS | Status: DC | PRN
  Administered 2020-11-16: 10 mL

## 2020-11-11 MED ORDER — ZINC SULFATE 220 (50 ZN) MG PO CAPS
220.0000 mg | ORAL_CAPSULE | Freq: Every day | ORAL | Status: DC
  Administered 2020-11-13 – 2020-11-20 (×8): 220 mg via ORAL
  Filled 2020-11-11 (×9): qty 1

## 2020-11-11 MED ORDER — IOHEXOL 350 MG/ML SOLN
100.0000 mL | Freq: Once | INTRAVENOUS | Status: AC | PRN
  Administered 2020-11-11: 100 mL via INTRAVENOUS

## 2020-11-11 MED ORDER — SODIUM CHLORIDE 0.9 % IV SOLN
100.0000 mg | Freq: Every day | INTRAVENOUS | Status: DC
  Administered 2020-11-12: 100 mg via INTRAVENOUS
  Filled 2020-11-11: qty 20

## 2020-11-11 MED ORDER — SODIUM CHLORIDE 0.9 % IV SOLN
200.0000 mg | Freq: Once | INTRAVENOUS | Status: AC
  Administered 2020-11-11: 200 mg via INTRAVENOUS
  Filled 2020-11-11: qty 40

## 2020-11-11 NOTE — ED Notes (Signed)
Hospitalist at bedside 

## 2020-11-11 NOTE — ED Notes (Addendum)
Respiratory made aware assistance needed transferring patient to CT for imaging prior to transfer to the floor. Awaiting respiratory at this time. Floor RN made aware of delay.

## 2020-11-11 NOTE — Progress Notes (Signed)
Pt removed from BIPAP and placed on 6 LPM [place] to facilitate CT angio and transport to ICU.  Pt tolerating [place] well at this time.

## 2020-11-11 NOTE — ED Provider Notes (Signed)
[facility]-EMERGENCY DEPT Provider Note   CSN: [id number] Arrival date & time: 11/11/20  1203     History Chief Complaint  Patient presents with  . Respiratory Distress  . Pneumonia    [name] is a 55 y.o. female.  55 year old female presents with shortness of breath times several days.  History of COPD and was recently diagnosed with pneumonia.  She is not on home oxygen.  EMS was called and patient found to be in respite distress.  Gave patient albuterol treatment which made her feel better.  Patient has completed her entire Covid vaccination series.  No further history obtainable due to her current state.        Past Medical History:  Diagnosis Date  . Allergic rhinitis   . Asthma   . COPD (chronic obstructive pulmonary disease) (HCC)    on home O2  . GERD (gastroesophageal reflux disease)   . Hypertension   . Open wound of left hip    spouse changes dressing q day  . Shortness of breath   . Sleep apnea    NO DEVICE IN USE, USES SUPPLEMENTAL O2 IN PLACE OF DEVICE   . Supplemental oxygen dependent    4L CONTINUOUS     Patient Active Problem List   Diagnosis Date Noted  . Aortic stenosis 09/07/2020  . Acute on chronic heart failure with preserved ejection fraction (HFpEF) (HCC) 09/07/2020  . Osteoarthritis of left hip 02/18/2019  . Chronic respiratory failure with hypoxia (HCC) 05/20/2018  . Chronic pain 05/20/2018  . Tobacco abuse 05/20/2018  . Essential hypertension   . GERD (gastroesophageal reflux disease)   . COPD (chronic obstructive pulmonary disease) (HCC)   . Asthma   . Obesity due to excess calories   . Lumbar spinal stenosis 04/03/2012    Class: Diagnosis of  . COPD exacerbation (HCC) 05/31/2010    Past Surgical History:  Procedure Laterality Date  . COLONOSCOPY    . HUMERUS IM NAIL Left 07/23/2017   Procedure: INTRAMEDULLARY (IM) NAIL HUMERAL;  Surgeon: Yolonda Kida, MD;  Location: [facility] OR;  Service:  Orthopedics;  Laterality: Left;  . INCISION AND DRAINAGE HIP Left 04/01/2019   Procedure: IRRIGATION AND DEBRIDEMENT HIP;  Surgeon: Samson Frederic, MD;  Location: [facility] ORS;  Service: Orthopedics;  Laterality: Left;  . LUMBAR LAMINECTOMY/DECOMPRESSION MICRODISCECTOMY  04/06/2012   Procedure: LUMBAR LAMINECTOMY/DECOMPRESSION MICRODISCECTOMY;  Surgeon: Kerrin Champagne, MD;  Location: [facility] OR;  Service: Orthopedics;  Laterality: N/A;  Left L3-4 and L4-5 lateral recess decompression MIS  . NECK SURGERY  Sept 2005  . TOTAL ABDOMINAL HYSTERECTOMY  Oct 2003   partial  . TOTAL HIP ARTHROPLASTY Left 02/18/2019   Procedure: TOTAL HIP ARTHROPLASTY ANTERIOR APPROACH;  Surgeon: Samson Frederic, MD;  Location: [facility] ORS;  Service: Orthopedics;  Laterality: Left;  . TUBAL LIGATION  1995     OB History   No obstetric history on file.     Family History  Problem Relation Age of Onset  . Emphysema Father   . Rheum arthritis Father   . Lung cancer Father   . Emphysema Mother     Social History   Tobacco Use  . Smoking status: Current Every Day Smoker    Packs/day: 1.00    Years: 30.00    Pack years: 30.00    Types: Cigarettes  . Smokeless tobacco: Never Used  . Tobacco comment: currently smoking 1/2 ppd.   Vaping Use  . Vaping Use: Never used  Substance Use Topics  . Alcohol use: Not Currently  . Drug use: No    Home Medications Prior to Admission medications   Medication Sig Start Date End Date Taking? Authorizing Provider  albuterol (PROVENTIL) (2.5 MG/3ML) 0.083% nebulizer solution Take 3 mLs (2.5 mg total) by nebulization every 4 (four) hours as needed for wheezing or shortness of breath. 05/23/18   Delano Metz, MD  aspirin 81 MG chewable tablet Chew 1 tablet (81 mg total) by mouth 2 (two) times daily. Patient not taking: No sig reported 02/19/19   Samson Frederic, MD  azithromycin (ZITHROMAX) 250 MG tablet Take 250 mg by mouth daily.    [provider]   Budeson-Glycopyrrol-Formoterol (BREZTRI AEROSPHERE) 160-9-4.8 MCG/ACT AERO Inhale 2 puffs into the lungs in the morning and at bedtime.    [provider]  cetirizine (ZYRTEC) 10 MG tablet Take 10 mg daily by mouth.    [provider]  COLLAGEN PO Take 1,000 mg by mouth 3 (three) times daily.    [provider]  DULoxetine (CYMBALTA) 30 MG capsule Take 30 mg by mouth in the morning and at bedtime. 07/12/20   [provider]  fluticasone (FLONASE) 50 MCG/ACT nasal spray Place 1 spray into both nostrils daily as needed for allergies.    [provider]  furosemide (LASIX) 20 MG tablet Take 1 tablet (20 mg total) by mouth daily as needed for edema (weight gain of 3lbs in 1 day or 5 lbs in 2 days.). Patient taking differently: Take 20 mg by mouth daily. 07/27/20   Rolly Salter, MD  gabapentin (NEURONTIN) 400 MG capsule Take 400 mg by mouth 3 (three) times daily. 08/09/20   [provider]  HYDROcodone-acetaminophen (NORCO) 10-325 MG tablet Take 1 tablet by mouth every 6 (six) hours as needed (breakthrough pain). Patient taking differently: Take 1 tablet by mouth 3 (three) times daily. 04/02/19   Swinteck, Arlys John, MD  losartan-hydrochlorothiazide (HYZAAR) 100-12.5 MG tablet Take 1 tablet by mouth daily. Patient not taking: No sig reported 06/15/20   [provider]  montelukast (SINGULAIR) 10 MG tablet Take 10 mg at bedtime by mouth.    [provider]  morphine (MS CONTIN) 30 MG 12 hr tablet Take 30 mg by mouth every 12 (twelve) hours.    [provider]  OXYGEN Inhale 4 L into the lungs continuous.     [provider]  pantoprazole (PROTONIX) 40 MG tablet Take 40 mg by mouth 2 (two) times daily. 08/16/20   [provider]  predniSONE (DELTASONE) 20 MG tablet Take 2 tablets (40 mg total) by mouth daily with breakfast. Take 2 tablets (40 mg total) by mouth daily with breakfast for 3 days, followed by 1 tablet  (20 mg) for 5 days. 09/10/20   Dolan Amen, MD  Vitamin D, Ergocalciferol, (DRISDOL) 1.25 MG (50000 UNIT) CAPS capsule Take 50,000 Units by mouth every Monday. 06/15/20   [provider]    Allergies    Amoxicillin-pot clavulanate, Erythromycin, Moxifloxacin, and Sulfamethoxazole-trimethoprim  Review of Systems   Review of Systems  Unable to perform ROS: Acuity of condition    Physical Exam Updated Vital Signs BP (!) 155/105   Pulse (!) 121   Temp 98.5 F (36.9 C)   Resp (!) 26   SpO2 100%   Physical Exam Vitals and nursing note reviewed.  Constitutional:      General: She is not in acute distress.    Appearance: Normal appearance. She is well-developed  and well-nourished. She is not toxic-appearing.  HENT:     Head: Normocephalic and atraumatic.  Eyes:     General: Lids are normal.     Extraocular Movements: EOM normal.     Conjunctiva/sclera: Conjunctivae normal.     Pupils: Pupils are equal, round, and reactive to light.  Neck:     Thyroid: No thyroid mass.     Trachea: No tracheal deviation.  Cardiovascular:     Rate and Rhythm: Normal rate and regular rhythm.     Heart sounds: Normal heart sounds. No murmur heard. No gallop.   Pulmonary:     Effort: Tachypnea and respiratory distress present.     Breath sounds: No stridor. Decreased breath sounds and wheezing present. No rhonchi or rales.  Abdominal:     General: Bowel sounds are normal. There is no distension.     Palpations: Abdomen is soft.     Tenderness: There is no abdominal tenderness. There is no CVA tenderness or rebound.  Musculoskeletal:        General: No tenderness or edema. Normal range of motion.     Cervical back: Normal range of motion and neck supple.  Skin:    General: Skin is warm and dry.     Findings: No abrasion or rash.  Neurological:     Mental Status: She is alert and oriented to person, place, and time.     GCS: GCS eye subscore is 4. GCS verbal subscore is 5. GCS motor  subscore is 6.     Cranial Nerves: No cranial nerve deficit.     Sensory: No sensory deficit.     Deep Tendon Reflexes: Strength normal.  Psychiatric:        Mood and Affect: Mood and affect normal.        Speech: Speech normal.        Behavior: Behavior normal.     ED Results / Procedures / Treatments   Labs (all labs ordered are listed, but only abnormal results are displayed) Labs Reviewed  RESP PANEL BY RT-PCR (FLU A&B, COVID) ARPGX2  CBC WITH DIFFERENTIAL/PLATELET  BASIC METABOLIC PANEL  BRAIN NATRIURETIC PEPTIDE  BLOOD GAS, ARTERIAL    EKG EKG Interpretation  Date/Time:  Saturday November 11 2020 13:01:47 EST Ventricular Rate:  126 PR Interval:    QRS Duration: 90 QT Interval:  296 QTC Calculation: 429 R Axis:   76 Text Interpretation: Sinus tachycardia Confirmed by Lorre Nick ([id number]) on 11/11/2020 1:22:28 PM   Radiology No results found.  Procedures Procedures   Medications Ordered in ED Medications  0.9 %  sodium chloride infusion (has no administration in time range)  methylPREDNISolone sodium succinate (SOLU-MEDROL) 125 mg/2 mL injection 125 mg (has no administration in time range)  albuterol (PROVENTIL,VENTOLIN) solution continuous neb (has no administration in time range)    ED Course  I have reviewed the triage vital signs and the nursing notes.  Pertinent labs & imaging results that were available during my care of the patient were reviewed by me and considered in my medical decision making (see chart for details).    MDM Rules/Calculators/A&P                          Presented in acute respiratory distress.  Required BiPAP.  Blood gas does show some hypercapnia.  Mild leukocytosis noted with bicarb 18,000.  Chest x-ray without evidence pneumothorax or infiltrate.  Covid test is pending at this time.  Patient reassessed multiple times and is improved much since being on BiPAP.  Discussed with hospitalist and she will be admitted   CRITICAL  CARE Performed by: Toy BakerAnthony T Lennon Boutwell Total critical care time: 60 minutes Critical care time was exclusive of separately billable procedures and treating other patients. Critical care was necessary to treat or prevent imminent or life-threatening deterioration. Critical care was time spent personally by me on the following activities: development of treatment plan with patient and/or surrogate as well as nursing, discussions with consultants, evaluation of patient's response to treatment, examination of patient, obtaining history from patient or surrogate, ordering and performing treatments and interventions, ordering and review of laboratory studies, ordering and review of radiographic studies, pulse oximetry and re-evaluation of patient's condition.  Final Clinical Impression(s) / ED Diagnoses Final diagnoses:  None    Rx / DC Orders ED Discharge Orders    None       Lorre NickAllen, Kailo Kosik, MD 11/11/20 1323

## 2020-11-11 NOTE — ED Notes (Signed)
Patient transported to CT and up to floor by this RN, Karma Ganja RN, and RT. Belongings sent with husband.

## 2020-11-11 NOTE — ED Notes (Signed)
Respiratory at bedside.

## 2020-11-11 NOTE — H&P (Addendum)
History and Physical    [name]:[id number] DOB: 14-Aug-1966 DOA: 11/11/2020  PCP: [name], MD  Patient coming from: Home  Chief Complaint: dyspnea  HPI: [name] is a 55 y.o. female with medical history significant of COPD, HFpEF, chronic hypoxic respiratory failure on 4L [place] at home, OSA. Presenting with dyspnea. Hx per husband as patient is currently on BiPap and it is difficult for Morrison to speak. Husband reports that patient had chest and sinus congestion about 5 days ago. OTC meds were not helping so she saw Morrison pulmonologist. She was diagnosed with a pneumonia and was given levaquin and prednisone. Morrison husband reports that Morrison symptoms have only gotten worse. This morning it was exceedingly difficult for Morrison to breathe, so she came to the ED. Denies any other aggravating or alleviating factors.   Of note, she is thrice vaccinated for COVID.  ED Course: She was given 125 of solmedrol, a nebulizer tx, and placed on BiPap. CXR was negative for acute disease.TRH was called for admission.   Review of Systems:  Unable to obtain d/t respiratory distress.   PMHx Past Medical History:  Diagnosis Date  . Allergic rhinitis   . Asthma   . COPD (chronic obstructive pulmonary disease) (HCC)    on home O2  . GERD (gastroesophageal reflux disease)   . Hypertension   . Open wound of left hip    spouse changes dressing q day  . Shortness of breath   . Sleep apnea    NO DEVICE IN USE, USES SUPPLEMENTAL O2 IN PLACE OF DEVICE   . Supplemental oxygen dependent    4L CONTINUOUS     PSHx Past Surgical History:  Procedure Laterality Date  . COLONOSCOPY    . HUMERUS IM NAIL Left 07/23/2017   Procedure: INTRAMEDULLARY (IM) NAIL HUMERAL;  Surgeon: Yolonda Kida, MD;  Location: [facility] OR;  Service: Orthopedics;  Laterality: Left;  . INCISION AND DRAINAGE HIP Left 04/01/2019   Procedure: IRRIGATION AND DEBRIDEMENT HIP;  Surgeon: Samson Frederic, MD;  Location: [facility] ORS;   Service: Orthopedics;  Laterality: Left;  . LUMBAR LAMINECTOMY/DECOMPRESSION MICRODISCECTOMY  04/06/2012   Procedure: LUMBAR LAMINECTOMY/DECOMPRESSION MICRODISCECTOMY;  Surgeon: Kerrin Champagne, MD;  Location: [facility] OR;  Service: Orthopedics;  Laterality: N/A;  Left L3-4 and L4-5 lateral recess decompression MIS  . NECK SURGERY  Sept 2005  . TOTAL ABDOMINAL HYSTERECTOMY  Oct 2003   partial  . TOTAL HIP ARTHROPLASTY Left 02/18/2019   Procedure: TOTAL HIP ARTHROPLASTY ANTERIOR APPROACH;  Surgeon: Samson Frederic, MD;  Location: [facility] ORS;  Service: Orthopedics;  Laterality: Left;  . TUBAL LIGATION  1995    SocHx  reports that she has been smoking cigarettes. She has a 30.00 pack-year smoking history. She has never used smokeless tobacco. She reports previous alcohol use. She reports that she does not use drugs.  Allergies  Allergen Reactions  . Amoxicillin-Pot Clavulanate Itching    Ok with benadryl Did it involve swelling of the face/tongue/throat, SOB, or low BP? No Did it involve sudden or severe rash/hives, skin peeling, or any reaction on the inside of your mouth or nose? No Did you need to seek medical attention at a hospital or doctor's office? No When did it last happen?6 months If all above answers are "NO", may proceed with cephalosporin use.   . Erythromycin Rash    Ok with benadryl   . Moxifloxacin Itching    Reaction to Avelox - ok with benadryl  .  Sulfamethoxazole-Trimethoprim Itching    Ok with benadryl    FamHx Family History  Problem Relation Age of Onset  . Emphysema Father   . Rheum arthritis Father   . Lung cancer Father   . Emphysema Mother     Prior to Admission medications   Medication Sig Start Date End Date Taking? Authorizing Provider  albuterol (PROVENTIL) (2.5 MG/3ML) 0.083% nebulizer solution Take 3 mLs (2.5 mg total) by nebulization every 4 (four) hours as needed for wheezing or shortness of breath. 05/23/18   Delano Metz, MD  aspirin 81 MG  chewable tablet Chew 1 tablet (81 mg total) by mouth 2 (two) times daily. Patient not taking: No sig reported 02/19/19   Samson Frederic, MD  azithromycin (ZITHROMAX) 250 MG tablet Take 250 mg by mouth daily.    [provider]  Budeson-Glycopyrrol-Formoterol (BREZTRI AEROSPHERE) 160-9-4.8 MCG/ACT AERO Inhale 2 puffs into the lungs in the morning and at bedtime.    [provider]  cetirizine (ZYRTEC) 10 MG tablet Take 10 mg daily by mouth.    [provider]  COLLAGEN PO Take 1,000 mg by mouth 3 (three) times daily.    [provider]  DULoxetine (CYMBALTA) 30 MG capsule Take 30 mg by mouth in the morning and at bedtime. 07/12/20   [provider]  fluticasone (FLONASE) 50 MCG/ACT nasal spray Place 1 spray into both nostrils daily as needed for allergies.    [provider]  furosemide (LASIX) 20 MG tablet Take 1 tablet (20 mg total) by mouth daily as needed for edema (weight gain of 3lbs in 1 day or 5 lbs in 2 days.). Patient taking differently: Take 20 mg by mouth daily. 07/27/20   Rolly Salter, MD  gabapentin (NEURONTIN) 400 MG capsule Take 400 mg by mouth 3 (three) times daily. 08/09/20   [provider]  HYDROcodone-acetaminophen (NORCO) 10-325 MG tablet Take 1 tablet by mouth every 6 (six) hours as needed (breakthrough pain). Patient taking differently: Take 1 tablet by mouth 3 (three) times daily. 04/02/19   Swinteck, Arlys John, MD  losartan-hydrochlorothiazide (HYZAAR) 100-12.5 MG tablet Take 1 tablet by mouth daily. Patient not taking: No sig reported 06/15/20   [provider]  montelukast (SINGULAIR) 10 MG tablet Take 10 mg at bedtime by mouth.    [provider]  morphine (MS CONTIN) 30 MG 12 hr tablet Take 30 mg by mouth every 12 (twelve) hours.    [provider]  OXYGEN Inhale 4 L into the lungs continuous.     [provider]  pantoprazole (PROTONIX) 40 MG tablet Take 40 mg by mouth 2 (two)  times daily. 08/16/20   [provider]  predniSONE (DELTASONE) 20 MG tablet Take 2 tablets (40 mg total) by mouth daily with breakfast. Take 2 tablets (40 mg total) by mouth daily with breakfast for 3 days, followed by 1 tablet (20 mg) for 5 days. 09/10/20   Dolan Amen, MD  Vitamin D, Ergocalciferol, (DRISDOL) 1.25 MG (50000 UNIT) CAPS capsule Take 50,000 Units by mouth every Monday. 06/15/20   [provider]    Physical Exam: Vitals:   11/11/20 0029 11/11/20 1209 11/11/20 1238 11/11/20 1245  BP:  (!) 155/105 (!) 155/105 (!) 140/105  Pulse:  (!) 121 (!) 122 (!) 123  Resp:  (!) 26 (!) 25 (!) 23  Temp:  98.5 F (36.9 C)    SpO2: 98% 100% 98% 100%    General: 55 y.o. ill appearing female resting  in bed on bipap Eyes: PERRL, normal sclera ENMT: Nares patent w/o discharge, orophaynx clear, dentition normal, ears w/o discharge/lesions/ulcers Neck: Supple, trachea midline Cardiovascular: tachy, +S1, S2, no m/g/r, equal pulses throughout Respiratory: decreased at bases, wheeze note b/l upper fields, increased WOB on BiPap GI: BS+, NDNT, no masses noted, no organomegaly noted MSK: No e/c/c Skin: No rashes, bruises, ulcerations noted Neuro: Alert and following commands, no focal deficits Psyc: Calm and cooperative  Labs on Admission: I have personally reviewed following labs and imaging studies  CBC: Recent Labs  Lab 11/11/20 1216  WBC 18.6*  NEUTROABS 15.3*  HGB 12.9  HCT 43.9  MCV 89.2  PLT 222   Basic Metabolic Panel: Recent Labs  Lab 11/11/20 1216  NA 138  K 3.9  CL 95*  CO2 34*  GLUCOSE 73  BUN 13  CREATININE 0.58  CALCIUM 8.1*   GFR: CrCl cannot be calculated (Unknown ideal weight.). Liver Function Tests: No results for input(s): AST, ALT, ALKPHOS, BILITOT, PROT, ALBUMIN in the last 168 hours. No results for input(s): LIPASE, AMYLASE in the last 168 hours. No results for input(s): AMMONIA in the last 168 hours. Coagulation Profile: No  results for input(s): INR, PROTIME in the last 168 hours. Cardiac Enzymes: No results for input(s): CKTOTAL, CKMB, CKMBINDEX, TROPONINI in the last 168 hours. BNP (last 3 results) No results for input(s): PROBNP in the last 8760 hours. HbA1C: No results for input(s): HGBA1C in the last 72 hours. CBG: No results for input(s): GLUCAP in the last 168 hours. Lipid Profile: No results for input(s): CHOL, HDL, LDLCALC, TRIG, CHOLHDL, LDLDIRECT in the last 72 hours. Thyroid Function Tests: No results for input(s): TSH, T4TOTAL, FREET4, T3FREE, THYROIDAB in the last 72 hours. Anemia Panel: No results for input(s): VITAMINB12, FOLATE, FERRITIN, TIBC, IRON, RETICCTPCT in the last 72 hours. Urine analysis:    Component Value Date/Time   COLORURINE STRAW (A) 02/15/2019 1309   APPEARANCEUR CLEAR 02/15/2019 1309   LABSPEC 1.002 (L) 02/15/2019 1309   PHURINE 7.0 02/15/2019 1309   GLUCOSEU NEGATIVE 02/15/2019 1309   HGBUR NEGATIVE 02/15/2019 1309   BILIRUBINUR NEGATIVE 02/15/2019 1309   KETONESUR NEGATIVE 02/15/2019 1309   PROTEINUR NEGATIVE 02/15/2019 1309   UROBILINOGEN 0.2 03/31/2012 1338   NITRITE NEGATIVE 02/15/2019 1309   LEUKOCYTESUR NEGATIVE 02/15/2019 1309    Radiological Exams on Admission: DG Chest Portable 1 View  Result Date: 11/11/2020 CLINICAL DATA:  Respiratory distress. Recent diagnosis of pneumonia. EXAM: PORTABLE CHEST 1 VIEW COMPARISON:  Single-view of the chest 09/06/2020 and 07/25/2020. PA and lateral chest 05/19/2018. FINDINGS: Streaky bibasilar opacities are chronic. The lungs are otherwise clear. Heart size is normal. No pneumothorax or pleural fluid. No acute or focal bony abnormality. IMPRESSION: No acute disease. Streaky bibasilar opacities are chronic and may be due to scar or fibrosis. Electronically Signed   By: Drusilla Kannerhomas  Dalessio M.D.   On: 11/11/2020 12:23    EKG: Independently reviewed. Sinus tachy, no st elevations  Assessment/Plan Acute on chronic hypoxic and  hypercapnic respiratory failure COPD exacerbation COVID 19 infection OSA     - admit to inpt, SDU     - steroids, remdes, inhalers, anti-tussives, IS, FV     - currently on BiPap (baseline 4L [place] use), wean O2 as able     - follow inflammatory markers     - check procal; if negative, will consider actemra; she was recently started on abx for PNA; will continue CAP coverage for now     -  CXR is clear of acute disease     - checking CTA chest  Chronic HFpEF     - BNP is 102; volume status is ok     - continue home meds  HTN     - continue home meds  DVT prophylaxis: lovenox  Code Status: FULL  Family Communication: w/ husband at bedside.  Consults called: [facility] pulmonology  Status is: Inpatient  Remains inpatient appropriate because:Inpatient level of care appropriate due to severity of illness   Dispo: The patient is from: Home              Anticipated d/c is to: Home              Patient currently is not medically stable to d/c.   Difficult to place patient No  Teddy Spike DO [facility]  If 7PM-7AM, please contact night-coverage [email or web address]  11/11/2020, 1:12 PM

## 2020-11-11 NOTE — ED Notes (Signed)
Family at bedside. 

## 2020-11-11 NOTE — ED Triage Notes (Addendum)
Per EMS, patient from home, dx with pneumonia x4 days ago. Respiratory distress. 22g R wrist. Rhonchi bilaterally. Hx COPD.

## 2020-11-12 ENCOUNTER — Inpatient Hospital Stay (HOSPITAL_COMMUNITY): Payer: Medicare Other

## 2020-11-12 DIAGNOSIS — R0603 Acute respiratory distress: Secondary | ICD-10-CM | POA: Diagnosis not present

## 2020-11-12 DIAGNOSIS — U071 COVID-19: Secondary | ICD-10-CM | POA: Diagnosis not present

## 2020-11-12 LAB — POCT I-STAT 7, (LYTES, BLD GAS, ICA,H+H)
Acid-Base Excess: 17 mmol/L — ABNORMAL HIGH (ref 0.0–2.0)
Bicarbonate: 44.2 mmol/L — ABNORMAL HIGH (ref 20.0–28.0)
Calcium, Ion: 1.2 mmol/L (ref 1.15–1.40)
HCT: 36 % (ref 36.0–46.0)
Hemoglobin: 12.2 g/dL (ref 12.0–15.0)
O2 Saturation: 99 %
Patient temperature: 98.6
Potassium: 4.5 mmol/L (ref 3.5–5.1)
Sodium: 139 mmol/L (ref 135–145)
TCO2: 46 mmol/L — ABNORMAL HIGH (ref 22–32)
pCO2 arterial: 62.4 mmHg — ABNORMAL HIGH (ref 32.0–48.0)
pH, Arterial: 7.458 — ABNORMAL HIGH (ref 7.350–7.450)
pO2, Arterial: 144 mmHg — ABNORMAL HIGH (ref 83.0–108.0)

## 2020-11-12 LAB — GLUCOSE, CAPILLARY
Glucose-Capillary: 147 mg/dL — ABNORMAL HIGH (ref 70–99)
Glucose-Capillary: 154 mg/dL — ABNORMAL HIGH (ref 70–99)
Glucose-Capillary: 146 mg/dL — ABNORMAL HIGH (ref 70–99)
Glucose-Capillary: 182 mg/dL — ABNORMAL HIGH (ref 70–99)

## 2020-11-12 LAB — COMPREHENSIVE METABOLIC PANEL
ALT: 18 U/L (ref 0–44)
AST: 15 U/L (ref 15–41)
Albumin: 3.3 g/dL — ABNORMAL LOW (ref 3.5–5.0)
Alkaline Phosphatase: 52 U/L (ref 38–126)
Anion gap: 12 (ref 5–15)
BUN: 16 mg/dL (ref 6–20)
CO2: 35 mmol/L — ABNORMAL HIGH (ref 22–32)
Calcium: 8.9 mg/dL (ref 8.9–10.3)
Chloride: 94 mmol/L — ABNORMAL LOW (ref 98–111)
Creatinine, Ser: 0.64 mg/dL (ref 0.44–1.00)
GFR, Estimated: >60 mL/min (ref >60)
Glucose, Bld: 150 mg/dL — ABNORMAL HIGH (ref 70–99)
Potassium: 4.6 mmol/L (ref 3.5–5.1)
Sodium: 141 mmol/L (ref 135–145)
Total Bilirubin: 0.6 mg/dL (ref 0.3–1.2)
Total Protein: 6.4 g/dL — ABNORMAL LOW (ref 6.5–8.1)

## 2020-11-12 LAB — CBC WITH DIFFERENTIAL/PLATELET
Abs Immature Granulocytes: 0.23 10*3/uL — ABNORMAL HIGH (ref 0.00–0.07)
Basophils Absolute: 0 10*3/uL (ref 0.0–0.1)
Basophils Relative: 0 %
Eosinophils Absolute: 0 10*3/uL (ref 0.0–0.5)
Eosinophils Relative: 0 %
HCT: 40.5 % (ref 36.0–46.0)
Hemoglobin: 12 g/dL (ref 12.0–15.0)
Immature Granulocytes: 2 %
Lymphocytes Relative: 3 %
Lymphs Abs: 0.3 10*3/uL — ABNORMAL LOW (ref 0.7–4.0)
MCH: 26.5 pg (ref 26.0–34.0)
MCHC: 29.6 g/dL — ABNORMAL LOW (ref 30.0–36.0)
MCV: 89.6 fL (ref 80.0–100.0)
Monocytes Absolute: 0.3 10*3/uL (ref 0.1–1.0)
Monocytes Relative: 2 %
Neutro Abs: 10.6 10*3/uL — ABNORMAL HIGH (ref 1.7–7.7)
Neutrophils Relative %: 93 %
Platelets: 231 10*3/uL (ref 150–400)
RBC: 4.52 MIL/uL (ref 3.87–5.11)
RDW: 15.6 % — ABNORMAL HIGH (ref 11.5–15.5)
WBC: 11.4 10*3/uL — ABNORMAL HIGH (ref 4.0–10.5)
nRBC: 0 % (ref 0.0–0.2)

## 2020-11-12 LAB — D-DIMER, QUANTITATIVE: D-Dimer, Quant: 0.77 ug/mL-FEU — ABNORMAL HIGH (ref 0.00–0.50)

## 2020-11-12 LAB — C-REACTIVE PROTEIN: CRP: 10.4 mg/dL — ABNORMAL HIGH (ref <1.0)

## 2020-11-12 LAB — PHOSPHORUS: Phosphorus: 5 mg/dL — ABNORMAL HIGH (ref 2.5–4.6)

## 2020-11-12 LAB — MAGNESIUM: Magnesium: 2.3 mg/dL (ref 1.7–2.4)

## 2020-11-12 LAB — FERRITIN: Ferritin: 82 ng/mL (ref 11–307)

## 2020-11-12 MED ORDER — NICOTINE 14 MG/24HR TD PT24
14.0000 mg | MEDICATED_PATCH | Freq: Every day | TRANSDERMAL | Status: DC
  Administered 2020-11-12 – 2020-11-20 (×9): 14 mg via TRANSDERMAL
  Filled 2020-11-12 (×9): qty 1

## 2020-11-12 MED ORDER — FUROSEMIDE 10 MG/ML IJ SOLN
20.0000 mg | Freq: Once | INTRAMUSCULAR | Status: AC
  Administered 2020-11-12: 20 mg via INTRAVENOUS
  Filled 2020-11-12: qty 2

## 2020-11-12 MED ORDER — LORAZEPAM 2 MG/ML IJ SOLN
0.5000 mg | INTRAMUSCULAR | Status: DC
  Administered 2020-11-12: 0.5 mg via INTRAVENOUS
  Filled 2020-11-12: qty 1

## 2020-11-12 MED ORDER — LORAZEPAM 2 MG/ML IJ SOLN: 0.5000 mg | INTRAMUSCULAR | Status: DC | PRN

## 2020-11-12 MED ORDER — MORPHINE SULFATE (PF) 2 MG/ML IV SOLN
2.0000 mg | INTRAVENOUS | Status: DC | PRN
  Administered 2020-11-12 – 2020-11-13 (×2): 2 mg via INTRAVENOUS
  Filled 2020-11-12 (×2): qty 1

## 2020-11-12 MED ORDER — HYDRALAZINE HCL 20 MG/ML IJ SOLN
5.0000 mg | INTRAMUSCULAR | Status: DC | PRN
  Administered 2020-11-12 – 2020-11-13 (×2): 5 mg via INTRAVENOUS
  Filled 2020-11-12 (×2): qty 1

## 2020-11-12 MED ORDER — DEXMEDETOMIDINE HCL IN NACL 200 MCG/50ML IV SOLN
0.4000 ug/kg/h | INTRAVENOUS | Status: AC
  Administered 2020-11-12 – 2020-11-13 (×4): 0.4 ug/kg/h via INTRAVENOUS
  Administered 2020-11-13: 0.5 ug/kg/h via INTRAVENOUS
  Filled 2020-11-12 (×5): qty 50

## 2020-11-12 NOTE — Consult Note (Signed)
NAME:  [name], MRN:  [id number], DOB:  10-20-1965, LOS: 1 ADMISSION DATE:  11/11/2020, CONSULTATION DATE:  11/12/20 REFERRING MD:  TRH CHIEF COMPLAINT:  short of breath   Brief History:  55 year old with end stage COPD admitted with   History of Present Illness:  Presented to ED with dyspnea. Present about a week. Contacted pulmonary doctor Kathryne Sharper) and started on levofloxacin and oral prednisone. WOB worsened prompting presentation. In ED, Covid positive, CXR similar to one prior 08/2020 on my interpretation with bilateral lower lung field bronchitic changes. Started on steroids, given actemra, Abx for CAP as well as remdesivir. Been on BiPAP for 24 hours given increased WOB.  Past Medical History:  COPD dCHF  Significant Hospital Events:  Admit 3/5 Transfer PCCM 3/6   Consults:  PCCM  Procedures:  n/a  Significant Diagnostic Tests:  CXR 3/5 chronic LLL bronchitic changes CTA 3/5 Tree in bud RLL, no PE  Micro Data:  MRSA PCR negative  Antimicrobials:  CTX/Azithro 3/5 >> Remdesivir 3/5>>3/6  Interim History / Subjective:  As above  Objective   Blood pressure (!) 163/93, pulse (!) 111, temperature 97.9 F (36.6 C), temperature source Oral, resp. rate (!) 21, height 5\' 3"  (1.6 m), weight 86 kg, SpO2 92 %.    Vent Mode: BIPAP FiO2 (%):  [40 %] 40 % Set Rate:  [10 bmp] 10 bmp PEEP:  [5 cmH20] 5 cmH20   Intake/Output Summary (Last 24 hours) at 11/12/2020 1539 Last data filed at 11/12/2020 1518 Gross per 24 hour  Intake 841.66 ml  Output 750 ml  Net 91.66 ml   Filed Weights   11/11/20 1640 11/11/20 1727  Weight: 84.6 kg 86 kg    Examination: General: lethargic, easily arousable Eyes: EOMI, no icterus Lungs: Distant, poor air movement, increased WOB Cardiovascular: RRR, no murmurs Abdomen: distended, BS present Extremities: Chronic venous changes, trace edema Neuro: oriented x 3, no focal deficits  Resolved Hospital Problem list    n/a  Assessment & Plan:  Acute in chronic hypercapnic/hypoxemic respiratory failure: In setting of COPD, likely OHS with acute exacerbation related to atypical Pna/CAP. --BiPAP for WOB, ok with HFNC trials --High dose steroids --Azithro/CTX  --Low dose morphine for air hunger  COVID 19 infection: Imaging appears chronic, CT more consistent with bacterial/atypical Pna.  --s/p Actemra --Continue high dose steroid with wean, will hopefully help with above --Stop remdesivir  Anxiety: --Precedex  Diastolic CHF: BNP mildly elevated. Volume status looks ok.  --IV lasix 20 mg (20 mg PO at home)   Best practice (evaluated daily)  Diet: NPO Pain/Anxiety/Delirium protocol (if indicated): Prededex VAP protocol (if indicated): n/a DVT prophylaxis: LOVENOX SQ GI prophylaxis: n/a Glucose control: n/a Mobility: as tolerated Disposition:ICU  Goals of Care:  Last date of multidisciplinary goals of care discussion:11/12/20 Family and staff present: MD, patient, husband Summary of discussion: End stage COPD, acute on chronic respiratory failure. Fear ventilator is a one way ticket, unable to get off vent. She would not want prolonged life support. Decided to be DNR.  Follow up goals of care discussion due: 11/18/20 Code Status: DNR  Labs   CBC: Recent Labs  Lab 11/11/20 1216 11/12/20 0243 11/12/20 0844  WBC 18.6* 11.4*  --   NEUTROABS 15.3* 10.6*  --   HGB 12.9 12.0 12.2  HCT 43.9 40.5 36.0  MCV 89.2 89.6  --   PLT 222 231  --     Basic Metabolic Panel: Recent Labs  Lab 11/11/20  1216 11/12/20 0243 11/12/20 0844  NA 138 141 139  K 3.9 4.6 4.5  CL 95* 94*  --   CO2 34* 35*  --   GLUCOSE 73 150*  --   BUN 13 16  --   CREATININE 0.58 0.64  --   CALCIUM 8.1* 8.9  --   MG  --  2.3  --   PHOS  --  5.0*  --    GFR: Estimated Creatinine Clearance: 82.5 mL/min (by C-G formula based on SCr of 0.64 mg/dL). Recent Labs  Lab 11/11/20 1216 11/12/20 0243  PROCALCITON <0.10  --    WBC 18.6* 11.4*    Liver Function Tests: Recent Labs  Lab 11/12/20 0243  AST 15  ALT 18  ALKPHOS 52  BILITOT 0.6  PROT 6.4*  ALBUMIN 3.3*   No results for input(s): LIPASE, AMYLASE in the last 168 hours. No results for input(s): AMMONIA in the last 168 hours.  ABG    Component Value Date/Time   PHART 7.458 (H) 11/12/2020 0844   PCO2ART 62.4 (H) 11/12/2020 0844   PO2ART 144 (H) 11/12/2020 0844   HCO3 44.2 (H) 11/12/2020 0844   TCO2 46 (H) 11/12/2020 0844   O2SAT 99.0 11/12/2020 0844     Coagulation Profile: No results for input(s): INR, PROTIME in the last 168 hours.  Cardiac Enzymes: No results for input(s): CKTOTAL, CKMB, CKMBINDEX, TROPONINI in the last 168 hours.  HbA1C: Hgb A1c MFr Bld  Date/Time Value Ref Range Status  09/07/2020 02:35 AM 6.1 (H) 4.8 - 5.6 % Final    Comment:    (NOTE) Pre diabetes:          5.7%-6.4%  Diabetes:              >6.4%  Glycemic control for   <7.0% adults with diabetes     CBG: Recent Labs  Lab 11/11/20 2207 11/11/20 2329 11/12/20 0332 11/12/20 0823 11/12/20 1225  GLUCAP 221* 207* 147* 154* 146*    Review of Systems:   Difficult to obtain given communication barrier on BiPAP  Past Medical History:  She,  has a past medical history of Allergic rhinitis, Asthma, CHF (congestive heart failure) (HCC), COPD (chronic obstructive pulmonary disease) (HCC), GERD (gastroesophageal reflux disease), Hypertension, Open wound of left hip, Shortness of breath, Sleep apnea, and Supplemental oxygen dependent.   Surgical History:   Past Surgical History:  Procedure Laterality Date   COLONOSCOPY     HUMERUS IM NAIL Left 07/23/2017   Procedure: INTRAMEDULLARY (IM) NAIL HUMERAL;  Surgeon: Yolonda Kida, MD;  Location: [facility] OR;  Service: Orthopedics;  Laterality: Left;   INCISION AND DRAINAGE HIP Left 04/01/2019   Procedure: IRRIGATION AND DEBRIDEMENT HIP;  Surgeon: Samson Frederic, MD;  Location: [facility] OR[facility];  Service:  Orthopedics;  Laterality: Left;   LUMBAR LAMINECTOMY/DECOMPRESSION MICRODISCECTOMY  04/06/2012   Procedure: LUMBAR LAMINECTOMY/DECOMPRESSION MICRODISCECTOMY;  Surgeon: Kerrin Champagne, MD;  Location: [facility] OR;  Service: Orthopedics;  Laterality: N/A;  Left L3-4 and L4-5 lateral recess decompression MIS   NECK SURGERY  Sept 2005   TOTAL ABDOMINAL HYSTERECTOMY  Oct 2003   partial   TOTAL HIP ARTHROPLASTY Left 02/18/2019   Procedure: TOTAL HIP ARTHROPLASTY ANTERIOR APPROACH;  Surgeon: Samson Frederic, MD;  Location: [facility];  Service: Orthopedics;  Laterality: Left;   TUBAL LIGATION  1995     Social History:   reports that she has been smoking cigarettes. She has a 30.00 pack-year smoking history. She has never used  smokeless tobacco. She reports previous alcohol use. She reports that she does not use drugs.   Family History:  Her family history includes Emphysema in her father and mother; Lung cancer in her father; Rheum arthritis in her father.   Allergies Allergies  Allergen Reactions   Amoxicillin-Pot Clavulanate Itching    Ok with benadryl Did it involve swelling of the face/tongue/throat, SOB, or low BP? No Did it involve sudden or severe rash/hives, skin peeling, or any reaction on the inside of your mouth or nose? No Did you need to seek medical attention at a hospital or doctor's office? No When did it last happen?6 months If all above answers are NO, may proceed with cephalosporin use.    Erythromycin Rash    Ok with benadryl    Moxifloxacin Itching    Reaction to Avelox - ok with benadryl   Sulfamethoxazole-Trimethoprim Itching    Ok with benadryl     Home Medications  Prior to Admission medications   Medication Sig Start Date End Date Taking? Authorizing Provider  albuterol (PROVENTIL) (2.5 MG/3ML) 0.083% nebulizer solution Take 3 mLs (2.5 mg total) by nebulization every 4 (four) hours as needed for wheezing or shortness of breath. 05/23/18  Yes Delano MetzSchertz,  Robert, MD  Budeson-Glycopyrrol-Formoterol (BREZTRI AEROSPHERE) 160-9-4.8 MCG/ACT AERO Inhale 2 puffs into the lungs in the morning and at bedtime.   Yes [provider]  cetirizine (ZYRTEC) 10 MG tablet Take 10 mg daily by mouth.   Yes [provider]  COLLAGEN PO Take 1,000 mg by mouth 3 (three) times daily.   Yes [provider]  dextromethorphan-guaiFENesin (MUCINEX DM) 30-600 MG 12hr tablet Take 1 tablet by mouth 2 (two) times daily.   Yes [provider]  DULoxetine (CYMBALTA) 30 MG capsule Take 30 mg by mouth daily. 07/12/20  Yes [provider]  fluticasone (FLONASE) 50 MCG/ACT nasal spray Place 1 spray into both nostrils daily as needed for allergies.   Yes [provider]  furosemide (LASIX) 20 MG tablet Take 1 tablet (20 mg total) by mouth daily as needed for edema (weight gain of 3lbs in 1 day or 5 lbs in 2 days.). Patient taking differently: Take 20 mg by mouth daily. 07/27/20  Yes Rolly SalterPatel, Pranav M, MD  gabapentin (NEURONTIN) 400 MG capsule Take 400 mg by mouth 3 (three) times daily. 08/09/20  Yes [provider]  HYDROcodone-acetaminophen (NORCO) 10-325 MG tablet Take 1 tablet by mouth every 6 (six) hours as needed (breakthrough pain). Patient taking differently: Take 1 tablet by mouth 3 (three) times daily. 04/02/19  Yes Swinteck, Arlys JohnBrian, MD  levofloxacin (LEVAQUIN) 500 MG tablet Take 500 mg by mouth daily. 11/09/20  Yes [provider]  montelukast (SINGULAIR) 10 MG tablet Take 10 mg at bedtime by mouth.   Yes [provider]  morphine (MS CONTIN) 30 MG 12 hr tablet Take 30 mg by mouth every 12 (twelve) hours.   Yes [provider]  nystatin-triamcinolone ointment (MYCOLOG) Apply 1 application topically 2 (two) times daily as needed (rash). 10/16/20  Yes [provider]  OXYGEN Inhale 4 L into the lungs continuous.    Yes [provider]  pantoprazole (PROTONIX) 40 MG tablet Take 40 mg  by mouth 2 (two) times daily. 08/16/20  Yes [provider]  polyethylene glycol powder (GLYCOLAX/MIRALAX) 17 GM/SCOOP powder Take 17 g by mouth daily as needed for mild constipation. 09/04/20  Yes [provider]  predniSONE (DELTASONE) 10 MG tablet Take 10  mg by mouth daily. 10/19/20  Yes [provider]  Vitamin D, Ergocalciferol, (DRISDOL) 1.25 MG (50000 UNIT) CAPS capsule Take 50,000 Units by mouth every Monday. 06/15/20  Yes [provider]  aspirin 81 MG chewable tablet Chew 1 tablet (81 mg total) by mouth 2 (two) times daily. Patient not taking: No sig reported 02/19/19   Samson Frederic, MD  azithromycin (ZITHROMAX) 250 MG tablet Take 250 mg by mouth daily.    [provider]  losartan-hydrochlorothiazide (HYZAAR) 100-12.5 MG tablet Take 1 tablet by mouth daily. Patient not taking: No sig reported 06/15/20   [provider]  predniSONE (DELTASONE) 20 MG tablet Take 2 tablets (40 mg total) by mouth daily with breakfast. Take 2 tablets (40 mg total) by mouth daily with breakfast for 3 days, followed by 1 tablet (20 mg) for 5 days. Patient not taking: Reported on 11/12/2020 09/10/20   Dolan Amen, MD     Critical care time:       CRITICAL CARE Performed by: Karren Burly   Total critical care time: 45 minutes  Critical care time was exclusive of separately billable procedures and treating other patients.  Critical care was necessary to treat or prevent imminent or life-threatening deterioration.  Critical care was time spent personally by me on the following activities: development of treatment plan with patient and/or surrogate as well as nursing, discussions with consultants, evaluation of patient's response to treatment, examination of patient, obtaining history from patient or surrogate, ordering and performing treatments and interventions, ordering and review of laboratory studies, ordering and review of radiographic  studies, pulse oximetry and re-evaluation of patient's condition.

## 2020-11-12 NOTE — Progress Notes (Signed)
PROGRESS NOTE    [name]  WUJ:[id number] DOB: 13-Sep-1965 DOA: 11/11/2020 PCP: [name], Michael C, MD   Brief Narrative:HPI per Dr Renae FickleKyle  [name] is a 55 y.o. female with medical history significant of COPD, HFpEF, chronic hypoxic respiratory failure on 4L [place] at home, OSA. Presenting with dyspnea. Hx per husband as patient is currently on BiPap and it is difficult for her to speak. Husband reports that patient had chest and sinus congestion about 5 days ago. OTC meds were not helping so she saw her pulmonologist. She was diagnosed with a pneumonia and was given levaquin and prednisone. Her husband reports that her symptoms have only gotten worse. This morning it was exceedingly difficult for her to breathe, so she came to the ED. Denies any other aggravating or alleviating factors.   Of note, she is thrice vaccinated for COVID.  ED Course: She was given 125 of solmedrol, a nebulizer tx, and placed on BiPap. CXR was negative for acute disease.TRH was called for admission.   Assessment & Plan:   Active Problems:   COVID-19    # Acute on chronic hypoxic hypercapneic respiratory failure secondary to COVID-19/COPD exacerbation-patient is admitted with dyspnea and cough.  She has been diagnosed with pneumonia prior to admission to the hospital last being treated with Levaquin and prednisone without much improvement.  At baseline she is on 4 L of oxygen at home.  Patient is completely vaccinated for Covid. she received Actemra and is on remdesivir. Continue Solu-Medrol. ABG after being on BiPAP overnight improved CT chest-No evidence acute pulmonary embolism.  Peribronchial thickening and consolidation in the periphery of the RIGHT lower lobe and tree-in-bud but pattern in the RIGHT middle lobe. Findings are concerning for early multilobar pneumonia within the RIGHT lung. Borderline enlarged mediastinal lymph nodes Continue BiPAP, Solu-Medrol, finish the course of remdesivir,  incentive spirometry once she is awake continue breathing treatments. Trend inflammatory markers. CRP 10.4, procalcitonin less than 0.10, D-dimer 0.77. White count 11.4 from 18.6.  #2 diastolic heart failure -BNP was 100.  Euvolemic.  Chest x-ray did not show any evidence of fluid overload or pleural effusion.  #3 history of essential hypertension-blood pressure elevated.  She is on Hyzaar at home with Lasix as needed.  Restart when she is able to take p.o.  I will start her on IV hydralazine as needed.   Estimated body mass index is 33.59 kg/m as calculated from the following:   Height as of this encounter: 5\' 3"  (1.6 m).   Weight as of this encounter: 86 kg.  DVT prophylaxis: Lovenox  code Status: Full code Family Communication: Called husband no response Disposition Plan:  Status is: Inpatient   Dispo: The patient is from: Home              Anticipated d/c is to: Home              Patient currently is not medically stable to d/c.   Difficult to place patient no    Consultants: none  Procedures: none Antimicrobials:  Anti-infectives (From admission, onward)   Start     Dose/Rate Route Frequency Ordered Stop   11/12/20 1000  remdesivir 100 mg in sodium chloride 0.9 % 100 mL IVPB  Status:  Discontinued       "Followed by" Linked Group Details   100 mg 200 mL/hr over 30 Minutes Intravenous Daily 11/11/20 1653 11/11/20 1655   11/12/20 1000  remdesivir 100 mg in sodium chloride 0.9 %  100 mL IVPB       "Followed by" Linked Group Details   100 mg 200 mL/hr over 30 Minutes Intravenous Daily 11/11/20 1354 11/16/20 0959   11/11/20 1500  remdesivir 200 mg in sodium chloride 0.9% 250 mL IVPB       "Followed by" Linked Group Details   200 mg 580 mL/hr over 30 Minutes Intravenous Once 11/11/20 1354 11/11/20 1722   11/11/20 1500  cefTRIAXone (ROCEPHIN) 2 g in sodium chloride 0.9 % 100 mL IVPB        2 g 200 mL/hr over 30 Minutes Intravenous Every 24 hours 11/11/20 1420     11/11/20  1500  azithromycin (ZITHROMAX) 500 mg in sodium chloride 0.9 % 250 mL IVPB        500 mg 250 mL/hr over 60 Minutes Intravenous Every 24 hours 11/11/20 1420     11/11/20 1400  remdesivir 200 mg in sodium chloride 0.9% 250 mL IVPB  Status:  Discontinued       "Followed by" Linked Group Details   200 mg 580 mL/hr over 30 Minutes Intravenous Once 11/11/20 1653 11/11/20 1655       Subjective:remains on Bipap No events overnite   Objective: Vitals:   11/12/20 0749 11/12/20 0800 11/12/20 0900 11/12/20 1000  BP:  126/84 (!) 150/88 (!) 157/98  Pulse:  80 85 98  Resp:  16 19 19   Temp: 98.4 F (36.9 C)     TempSrc: Axillary     SpO2: 98% 97% 96% 96%  Weight:      Height:        Intake/Output Summary (Last 24 hours) at 11/12/2020 1303 Last data filed at 11/12/2020 0939 Gross per 24 hour  Intake 740.74 ml  Output 750 ml  Net -9.26 ml   Filed Weights   11/11/20 1640 11/11/20 1727  Weight: 84.6 kg 86 kg    Examination:  General exam:on bipap Respiratory system: wheezing to auscultation. Respiratory effort normal. Cardiovascular system: S1 & S2 heard, RRR. No JVD, murmurs, rubs, gallops or clicks. No pedal edema. Gastrointestinal system: Abdomen is nondistended, soft and nontender. No organomegaly or masses felt. Normal bowel sounds heard. Central nervous system: Alert and oriented. No focal neurological deficits. Extremities: Symmetric 5 x 5 power. Skin: No rashes, lesions or ulcers Psychiatry: Judgement and insight appear normal. Mood & affect appropriate.     Data Reviewed: I have personally reviewed following labs and imaging studies  CBC: Recent Labs  Lab 11/11/20 1216 11/12/20 0243 11/12/20 0844  WBC 18.6* 11.4*  --   NEUTROABS 15.3* 10.6*  --   HGB 12.9 12.0 12.2  HCT 43.9 40.5 36.0  MCV 89.2 89.6  --   PLT 222 231  --    Basic Metabolic Panel: Recent Labs  Lab 11/11/20 1216 11/12/20 0243 11/12/20 0844  NA 138 141 139  K 3.9 4.6 4.5  CL 95* 94*  --    CO2 34* 35*  --   GLUCOSE 73 150*  --   BUN 13 16  --   CREATININE 0.58 0.64  --   CALCIUM 8.1* 8.9  --   MG  --  2.3  --   PHOS  --  5.0*  --    GFR: Estimated Creatinine Clearance: 82.5 mL/min (by C-G formula based on SCr of 0.64 mg/dL). Liver Function Tests: Recent Labs  Lab 11/12/20 0243  AST 15  ALT 18  ALKPHOS 52  BILITOT 0.6  PROT 6.4*  ALBUMIN 3.3*  No results for input(s): LIPASE, AMYLASE in the last 168 hours. No results for input(s): AMMONIA in the last 168 hours. Coagulation Profile: No results for input(s): INR, PROTIME in the last 168 hours. Cardiac Enzymes: No results for input(s): CKTOTAL, CKMB, CKMBINDEX, TROPONINI in the last 168 hours. BNP (last 3 results) No results for input(s): PROBNP in the last 8760 hours. HbA1C: No results for input(s): HGBA1C in the last 72 hours. CBG: Recent Labs  Lab 11/11/20 2207 11/11/20 2329 11/12/20 0332 11/12/20 0823 11/12/20 1225  GLUCAP 221* 207* 147* 154* 146*   Lipid Profile: No results for input(s): CHOL, HDL, LDLCALC, TRIG, CHOLHDL, LDLDIRECT in the last 72 hours. Thyroid Function Tests: No results for input(s): TSH, T4TOTAL, FREET4, T3FREE, THYROIDAB in the last 72 hours. Anemia Panel: Recent Labs    11/12/20 0243  FERRITIN 82   Sepsis Labs: Recent Labs  Lab 11/11/20 1216  PROCALCITON <0.10    Recent Results (from the past 240 hour(s))  Resp Panel by RT-PCR (Flu A&B, Covid) Nasopharyngeal Swab     Status: Abnormal   Collection Time: 11/11/20 12:16 PM   Specimen: Nasopharyngeal Swab; Nasopharyngeal(NP) swabs in vial transport medium  Result Value Ref Range Status   SARS Coronavirus 2 by RT PCR POSITIVE (A) NEGATIVE Final    Comment: RESULT CALLED TO, READ BACK BY AND VERIFIED WITH: HENRY AT 1322 ON 11/11/2020 BY JPM (NOTE) SARS-CoV-2 target nucleic acids are DETECTED.  The SARS-CoV-2 RNA is generally detectable in upper respiratory specimens during the acute phase of infection. Positive  results are indicative of the presence of the identified virus, but do not rule out bacterial infection or co-infection with other pathogens not detected by the test. Clinical correlation with patient history and other diagnostic information is necessary to determine patient infection status. The expected result is Negative.  Fact Sheet for Patients: BloggerCourse.com  Fact Sheet for Healthcare Providers: SeriousBroker.it  This test is not yet approved or cleared by the [place] FDA and  has been authorized for detection and/or diagnosis of SARS-CoV-2 by FDA under an Emergency Use Authorization (EUA).  This EUA will remain in effect (meaning this test can be  used) for the duration of  the COVID-19 declaration under Section 564(b)(1) of the Act, 21 U.S.C. section 360bbb-3(b)(1), unless the authorization is terminated or revoked sooner.     Influenza A by PCR NEGATIVE NEGATIVE Final   Influenza B by PCR NEGATIVE NEGATIVE Final    Comment: (NOTE) The Xpert Xpress SARS-CoV-2/FLU/RSV plus assay is intended as an aid in the diagnosis of influenza from Nasopharyngeal swab specimens and should not be used as a sole basis for treatment. Nasal washings and aspirates are unacceptable for Xpert Xpress SARS-CoV-2/FLU/RSV testing.  Fact Sheet for Patients: BloggerCourse.com  Fact Sheet for Healthcare Providers: SeriousBroker.it  This test is not yet approved or cleared by the [place] FDA and has been authorized for detection and/or diagnosis of SARS-CoV-2 by FDA under an Emergency Use Authorization (EUA). This EUA will remain in effect (meaning this test can be used) for the duration of the COVID-19 declaration under Section 564(b)(1) of the Act, 21 U.S.C. section 360bbb-3(b)(1), unless the authorization is terminated or revoked.  Performed at [facility],  [address], [place], [place] [address]   MRSA PCR Screening     Status: None   Collection Time: 11/11/20  4:55 PM   Specimen: Nasopharyngeal  Result Value Ref Range Status   MRSA by PCR NEGATIVE NEGATIVE Final  Comment:        The GeneXpert MRSA Assay (FDA approved for NASAL specimens only), is one component of a comprehensive MRSA colonization surveillance program. It is not intended to diagnose MRSA infection nor to guide or monitor treatment for MRSA infections. Performed at [facility], [address], [place], [place] [address]          Radiology Studies: CT ANGIO CHEST PE W OR WO CONTRAST  Result Date: 11/11/2020 CLINICAL DATA:  High probability of pulmonary embolism suspected. Shortness of breath for several days. COPD. EXAM: CT ANGIOGRAPHY CHEST WITH CONTRAST TECHNIQUE: Multidetector CT imaging of the chest was performed using the standard protocol during bolus administration of intravenous contrast. Multiplanar CT image reconstructions and MIPs were obtained to evaluate the vascular anatomy. CONTRAST:  OMNIPAQUE IOHEXOL 350 MG/ML SOLN COMPARISON:  None. FINDINGS: Cardiovascular: No filling defects within the pulmonary arteries to suggest acute pulmonary embolism. Mediastinum/Nodes: No axillary or supraclavicular adenopathy. No mediastinal or hilar adenopathy. No pericardial fluid. Esophagus normal. Paratracheal lymph nodes are upper limits of normal at 8-9 mm. Lungs/Pleura: Within the periphery of the RIGHT lower lobe there is interstitial thickening and peribronchial thickening with collapse since along the pleural surface (image 96/6). Finding extend into the superior segment of the RIGHT lower lobe is seen on sagittal image 38/8). Similar findings in the inferior lateral aspect of the RIGHT middle lobe where there is a fine branching nodular pattern (image 87/6. Upper Abdomen: Limited view of the liver, kidneys, pancreas are unremarkable.  Normal adrenal glands. Musculoskeletal: No acute osseous abnormality. Review of the MIP images confirms the above findings. IMPRESSION: 1. No evidence acute pulmonary embolism. 2. Peribronchial thickening and consolidation in the periphery of the RIGHT lower lobe and tree-in-bud but pattern in the RIGHT middle lobe. Findings are concerning for early multilobar pneumonia within the RIGHT lung. 3. Borderline enlarged mediastinal lymph nodes. Electronically Signed   By: Genevive Bi M.D.   On: 11/11/2020 16:39   DG Chest Portable 1 View  Result Date: 11/11/2020 CLINICAL DATA:  Respiratory distress. Recent diagnosis of pneumonia. EXAM: PORTABLE CHEST 1 VIEW COMPARISON:  Single-view of the chest 09/06/2020 and 07/25/2020. PA and lateral chest 05/19/2018. FINDINGS: Streaky bibasilar opacities are chronic. The lungs are otherwise clear. Heart size is normal. No pneumothorax or pleural fluid. No acute or focal bony abnormality. IMPRESSION: No acute disease. Streaky bibasilar opacities are chronic and may be due to scar or fibrosis. Electronically Signed   By: Drusilla Kanner M.D.   On: 11/11/2020 12:23        Scheduled Meds: . vitamin C  500 mg Oral Daily  . Chlorhexidine Gluconate Cloth  6 each Topical Daily  . enoxaparin (LOVENOX) injection  40 mg Subcutaneous Q24H  . Ipratropium-Albuterol  1 puff Inhalation Q6H  . LORazepam  0.5 mg Intravenous Q4H  . mouth rinse  15 mL Mouth Rinse BID  . methylPREDNISolone (SOLU-MEDROL) injection  80 mg Intravenous Q12H   Followed by  . [START ON 11/15/2020] predniSONE  50 mg Oral Daily  . nicotine  14 mg Transdermal Daily  . sodium chloride flush  10-40 mL Intracatheter Q12H  . zinc sulfate  220 mg Oral Daily   Continuous Infusions: . azithromycin Stopped (11/11/20 1851)  . cefTRIAXone (ROCEPHIN)  IV Stopped (11/11/20 1940)  . remdesivir 100 mg in NS 100 mL Stopped (11/12/20 0938)     LOS: 1 day     Alwyn Ren, MD  11/12/2020, 1:03 PM

## 2020-11-13 ENCOUNTER — Other Ambulatory Visit: Payer: Self-pay

## 2020-11-13 ENCOUNTER — Encounter (HOSPITAL_COMMUNITY): Payer: Self-pay | Admitting: Internal Medicine

## 2020-11-13 DIAGNOSIS — Z66 Do not resuscitate: Secondary | ICD-10-CM | POA: Diagnosis not present

## 2020-11-13 DIAGNOSIS — R0603 Acute respiratory distress: Secondary | ICD-10-CM | POA: Diagnosis not present

## 2020-11-13 DIAGNOSIS — J9600 Acute respiratory failure, unspecified whether with hypoxia or hypercapnia: Secondary | ICD-10-CM | POA: Diagnosis not present

## 2020-11-13 DIAGNOSIS — U071 COVID-19: Secondary | ICD-10-CM | POA: Diagnosis not present

## 2020-11-13 LAB — CBC WITH DIFFERENTIAL/PLATELET
Abs Immature Granulocytes: 0.16 10*3/uL — ABNORMAL HIGH (ref 0.00–0.07)
Basophils Absolute: 0 10*3/uL (ref 0.0–0.1)
Basophils Relative: 0 %
Eosinophils Absolute: 0 10*3/uL (ref 0.0–0.5)
Eosinophils Relative: 0 %
HCT: 41.5 % (ref 36.0–46.0)
Hemoglobin: 11.9 g/dL — ABNORMAL LOW (ref 12.0–15.0)
Immature Granulocytes: 2 %
Lymphocytes Relative: 5 %
Lymphs Abs: 0.5 10*3/uL — ABNORMAL LOW (ref 0.7–4.0)
MCH: 26.2 pg (ref 26.0–34.0)
MCHC: 28.7 g/dL — ABNORMAL LOW (ref 30.0–36.0)
MCV: 91.2 fL (ref 80.0–100.0)
Monocytes Absolute: 0.4 10*3/uL (ref 0.1–1.0)
Monocytes Relative: 4 %
Neutro Abs: 9.4 10*3/uL — ABNORMAL HIGH (ref 1.7–7.7)
Neutrophils Relative %: 89 %
Platelets: 239 10*3/uL (ref 150–400)
RBC: 4.55 MIL/uL (ref 3.87–5.11)
RDW: 15.2 % (ref 11.5–15.5)
WBC: 10.5 10*3/uL (ref 4.0–10.5)
nRBC: 0 % (ref 0.0–0.2)

## 2020-11-13 LAB — COMPREHENSIVE METABOLIC PANEL
ALT: 19 U/L (ref 0–44)
AST: 16 U/L (ref 15–41)
Albumin: 3.2 g/dL — ABNORMAL LOW (ref 3.5–5.0)
Alkaline Phosphatase: 49 U/L (ref 38–126)
Anion gap: 8 (ref 5–15)
BUN: 27 mg/dL — ABNORMAL HIGH (ref 6–20)
CO2: 41 mmol/L — ABNORMAL HIGH (ref 22–32)
Calcium: 8.9 mg/dL (ref 8.9–10.3)
Chloride: 94 mmol/L — ABNORMAL LOW (ref 98–111)
Creatinine, Ser: 0.68 mg/dL (ref 0.44–1.00)
GFR, Estimated: >60 mL/min (ref >60)
Glucose, Bld: 160 mg/dL — ABNORMAL HIGH (ref 70–99)
Potassium: 4.4 mmol/L (ref 3.5–5.1)
Sodium: 143 mmol/L (ref 135–145)
Total Bilirubin: 0.4 mg/dL (ref 0.3–1.2)
Total Protein: 6.4 g/dL — ABNORMAL LOW (ref 6.5–8.1)

## 2020-11-13 LAB — GLUCOSE, CAPILLARY: Glucose-Capillary: 170 mg/dL — ABNORMAL HIGH (ref 70–99)

## 2020-11-13 LAB — D-DIMER, QUANTITATIVE: D-Dimer, Quant: 0.63 ug/mL-FEU — ABNORMAL HIGH (ref 0.00–0.50)

## 2020-11-13 LAB — MAGNESIUM: Magnesium: 2.5 mg/dL — ABNORMAL HIGH (ref 1.7–2.4)

## 2020-11-13 LAB — FERRITIN: Ferritin: 136 ng/mL (ref 11–307)

## 2020-11-13 LAB — PHOSPHORUS: Phosphorus: 4.9 mg/dL — ABNORMAL HIGH (ref 2.5–4.6)

## 2020-11-13 LAB — C-REACTIVE PROTEIN: CRP: 6.8 mg/dL — ABNORMAL HIGH (ref <1.0)

## 2020-11-13 MED ORDER — METOPROLOL TARTRATE 5 MG/5ML IV SOLN
2.5000 mg | INTRAVENOUS | Status: DC | PRN
  Administered 2020-11-13 – 2020-11-15 (×6): 5 mg via INTRAVENOUS
  Filled 2020-11-13 (×6): qty 5

## 2020-11-13 MED ORDER — MORPHINE SULFATE (PF) 2 MG/ML IV SOLN
2.0000 mg | INTRAVENOUS | Status: DC | PRN
  Administered 2020-11-13 (×2): 2 mg via INTRAVENOUS
  Filled 2020-11-13 (×2): qty 1

## 2020-11-13 MED ORDER — DEXMEDETOMIDINE HCL IN NACL 200 MCG/50ML IV SOLN
0.4000 ug/kg/h | INTRAVENOUS | Status: DC
  Administered 2020-11-13 – 2020-11-14 (×2): 0.4 ug/kg/h via INTRAVENOUS
  Filled 2020-11-13 (×3): qty 50

## 2020-11-13 MED ORDER — MORPHINE SULFATE ER 30 MG PO TBCR
30.0000 mg | EXTENDED_RELEASE_TABLET | Freq: Two times a day (BID) | ORAL | Status: DC
Start: 2020-11-13 — End: 2020-11-13
  Administered 2020-11-13: 30 mg via ORAL
  Filled 2020-11-13: qty 1

## 2020-11-13 MED ORDER — LORAZEPAM 2 MG/ML IJ SOLN
1.0000 mg | Freq: Once | INTRAMUSCULAR | Status: AC
  Administered 2020-11-13: 1 mg via INTRAVENOUS
  Filled 2020-11-13: qty 1

## 2020-11-13 MED ORDER — DULOXETINE HCL 30 MG PO CPEP
30.0000 mg | ORAL_CAPSULE | Freq: Every day | ORAL | Status: DC
  Administered 2020-11-13 – 2020-11-20 (×8): 30 mg via ORAL
  Filled 2020-11-13 (×8): qty 1

## 2020-11-13 MED ORDER — FUROSEMIDE 10 MG/ML IJ SOLN
20.0000 mg | Freq: Once | INTRAMUSCULAR | Status: AC
  Administered 2020-11-13: 20 mg via INTRAVENOUS
  Filled 2020-11-13: qty 2

## 2020-11-13 MED ORDER — GABAPENTIN 400 MG PO CAPS
400.0000 mg | ORAL_CAPSULE | Freq: Three times a day (TID) | ORAL | Status: DC
  Administered 2020-11-13 – 2020-11-20 (×21): 400 mg via ORAL
  Filled 2020-11-13 (×21): qty 1

## 2020-11-13 MED ORDER — MORPHINE SULFATE (PF) 4 MG/ML IV SOLN
4.0000 mg | INTRAVENOUS | Status: DC
  Administered 2020-11-13 – 2020-11-16 (×24): 4 mg via INTRAVENOUS
  Filled 2020-11-13 (×24): qty 1

## 2020-11-13 NOTE — Progress Notes (Signed)
RRT Debbie notified and aware.

## 2020-11-13 NOTE — Progress Notes (Signed)
NAME:  [name], MRN:  [id number], DOB:  1966-06-03, LOS: 2 ADMISSION DATE:  11/11/2020, CONSULTATION DATE:  11/13/20 REFERRING MD:  TRH CHIEF COMPLAINT:  short of breath   Brief History:  54 y/oF with end stage COPD admitted 3/5 with one week hx of SOB. Contacted pulmonary doctor Kathryne Sharper) and started on levofloxacin and oral prednisone. WOB worsened prompting presentation. In ED, COVID positive, CXR similar to one prior 08/2020 with bilateral lower lung field bronchitic changes. Started on steroids, given actemra, Abx for CAP as well as remdesivir. Been on BiPAP for 24 hours given increased WOB.  Past Medical History:  COPD dCHF  Significant Hospital Events:  3/05 Admit with SOB, COVID positive 3/06 PCCM consulted  3/07 Precedex stopped, home meds resumed (ms contin, gabapentin)  Consults:  PCCM  Procedures:    Significant Diagnostic Tests:   CXR 3/5 >> chronic LLL bronchitic changes  CTA 3/5 >> Tree in bud RLL, no PE  Micro Data:  MRSA PCR 3/5 >> negative COVID 3/5 >> positive   Antimicrobials:  CTX 3/5 >>  Azithro 3/5 >> Remdesivir 3/5 >> 3/6  Interim History / Subjective:  Afebrile  BiPAP 50%, PEEP 5 Glucose range 146-182 Pt asking to come off bipap, wants to go back to [place] O2, requesting to eat / drink RN reports pt very anxious   Objective   Blood pressure (!) 166/111, pulse 90, temperature (!) 96.7 F (35.9 C), temperature source Axillary, resp. rate 20, height 5\' 3"  (1.6 m), weight 86 kg, SpO2 98 %.    Vent Mode: PCV FiO2 (%):  [30 %-50 %] 30 % Set Rate:  [10 bmp] 10 bmp PEEP:  [5 cmH20] 5 cmH20   Intake/Output Summary (Last 24 hours) at 11/13/2020 01/13/2021 Last data filed at 11/13/2020 0542 Gross per 24 hour  Intake 584.93 ml  Output 1700 ml  Net -1115.07 ml   Filed Weights   11/11/20 1640 11/11/20 1727  Weight: 84.6 kg 86 kg    Examination: General: chronically ill appearing adult female sitting up in bed, appears anxious  HEENT: MM  pink/dry, bipap mask in place, anicteric  Neuro: Awake, alert, follows commands / appropriate, MAE.   PSY: anxious CV: s1s2 RRR, tachy, no m/r/g PULM: prolonged exp phase, increased effort but speaking in full sentences, diminished breath sounds bilaterally GI: soft, bsx4 active  Extremities: warm/dry, no edema  Skin: thin skin, multiple areas of bruising   Resolved Hospital Problem list     Assessment & Plan:   Acute on Chronic Hypercapnic / Hypoxemic Respiratory Failure: In setting of COPD, likely OHS with acute exacerbation related to atypical PNA/CAP. -PRN BiPAP for comfort / increased WOB  -O2 for sats >85% at rest -continue high dose steroids  -continue ceftriaxone / azithromycin  -morphine IV PRN for air hunger  COVID 19 Infection Imaging appears chronic, CT more consistent with bacterial/atypical PNA. S/p Actemra.  -continue high dose steroids with wean  -pulmonary hygiene -follow inflammatory markers intermittently   Anxiety -resume home MS contin, gabapentin  -increase morphine IV PRN to Q2  -stop precedex at noon > allow enough time for oral med dosing   Diastolic CHF BNP mildly elevated. Volume status looks ok.  -IV lasix 20 mg x1 -follow I/O   Best practice (evaluated daily)  Diet: NPO Pain/Anxiety/Delirium protocol (if indicated): Transition back to home medications. VAP protocol (if indicated): n/a DVT prophylaxis: LOVENOX SQ GI prophylaxis: n/a Glucose control: n/a Mobility: as tolerated Disposition: SDU, to TRH  as of am 3/8  Goals of Care:  Last date of multidisciplinary goals of care discussion: 11/12/20 Family and staff present: MD, patient, husband Summary of discussion: End stage COPD, acute on chronic respiratory failure. Fear ventilator is a one way ticket, unable to get off vent. She would not want prolonged life support. Decided to be DNR.  Follow up goals of care discussion due: 11/18/20 Code Status: DNR  Labs   CBC: Recent Labs  Lab  11/11/20 1216 11/12/20 0243 11/12/20 0844 11/13/20 0343  WBC 18.6* 11.4*  --  10.5  NEUTROABS 15.3* 10.6*  --  9.4*  HGB 12.9 12.0 12.2 11.9*  HCT 43.9 40.5 36.0 41.5  MCV 89.2 89.6  --  91.2  PLT 222 231  --  239    Basic Metabolic Panel: Recent Labs  Lab 11/11/20 1216 11/12/20 0243 11/12/20 0844 11/13/20 0343  NA 138 141 139 143  K 3.9 4.6 4.5 4.4  CL 95* 94*  --  94*  CO2 34* 35*  --  41*  GLUCOSE 73 150*  --  160*  BUN 13 16  --  27*  CREATININE 0.58 0.64  --  0.68  CALCIUM 8.1* 8.9  --  8.9  MG  --  2.3  --  2.5*  PHOS  --  5.0*  --  4.9*   GFR: Estimated Creatinine Clearance: 82.5 mL/min (by C-G formula based on SCr of 0.68 mg/dL). Recent Labs  Lab 11/11/20 1216 11/12/20 0243 11/13/20 0343  PROCALCITON <0.10  --   --   WBC 18.6* 11.4* 10.5    Liver Function Tests: Recent Labs  Lab 11/12/20 0243 11/13/20 0343  AST 15 16  ALT 18 19  ALKPHOS 52 49  BILITOT 0.6 0.4  PROT 6.4* 6.4*  ALBUMIN 3.3* 3.2*   No results for input(s): LIPASE, AMYLASE in the last 168 hours. No results for input(s): AMMONIA in the last 168 hours.  ABG    Component Value Date/Time   PHART 7.458 (H) 11/12/2020 0844   PCO2ART 62.4 (H) 11/12/2020 0844   PO2ART 144 (H) 11/12/2020 0844   HCO3 44.2 (H) 11/12/2020 0844   TCO2 46 (H) 11/12/2020 0844   O2SAT 99.0 11/12/2020 0844     Coagulation Profile: No results for input(s): INR, PROTIME in the last 168 hours.  Cardiac Enzymes: No results for input(s): CKTOTAL, CKMB, CKMBINDEX, TROPONINI in the last 168 hours.  HbA1C: Hgb A1c MFr Bld  Date/Time Value Ref Range Status  09/07/2020 02:35 AM 6.1 (H) 4.8 - 5.6 % Final    Comment:    (NOTE) Pre diabetes:          5.7%-6.4%  Diabetes:              >6.4%  Glycemic control for   <7.0% adults with diabetes     CBG: Recent Labs  Lab 11/12/20 0332 11/12/20 0823 11/12/20 1225 11/12/20 1703 11/13/20 0336  GLUCAP 147* 154* 146* 182* 170*    Critical care time:        Canary Brim, MSN, APRN, NP-C, AGACNP-BC [facility] 11/13/2020, 8:33 AM   Please see [email or web address] for pager details.   From 7A-7P if no response, please call [phone] After hours, please call ELink [phone]

## 2020-11-13 NOTE — Progress Notes (Signed)
Fellow RT had removed PT from BiPAP at approximately 0945 and placed on 10 LPM [place] nasal cannula. At approx 1050 this RT placed PT back on BiPAP for WOB.

## 2020-11-13 NOTE — Progress Notes (Signed)
TRH pickup  11/14/2020 55 year old female with O2 dependent COPD, possible obstructive sleep apnea admitted with atypical pneumonia and Covid.  Status post Actemra.  Patient has been mostly on BiPAP.  CODE STATUS DNR.

## 2020-11-13 NOTE — Progress Notes (Signed)
Patient very anxious this time. Patient given x1 dose of ativan.  Pt c/o difficulty breathing and thinks " I need to go back on the mask again". Oxygen at 100 % at 12L [place] with salter. RR 31.  Will notify RRT.

## 2020-11-13 NOTE — Progress Notes (Addendum)
Patient HR sustaining in 120s-125. Pt asleep at this time, no sign of distress. BiPAP on at this time. Will notify CCM attending, Brandi Who is aware. PRN metoprolol ordered. Will administer and continue to monitor.

## 2020-11-13 NOTE — TOC Initial Note (Signed)
Transition of Care [facility]) - Initial/Assessment Note    Patient Details  Name: [name] MRN: [id number] Date of Birth: 1966/06/08  Transition of [name]) CM/SW Contact:    Golda Acre, RN Phone Number: 11/13/2020, 8:18 AM  Clinical Narrative:                 55 year old with end stage COPD admitted with   History of Present Illness:  Presented to ED with dyspnea. Present about a week. Contacted pulmonary doctor Kathryne Sharper) and started on levofloxacin and oral prednisone. WOB worsened prompting presentation. In ED, Covid positive, CXR similar to one prior 08/2020 on my interpretation with bilateral lower lung field bronchitic changes. Started on steroids, given actemra, Abx for CAP as well as remdesivir. Been on BiPAP for 24 hours given increased WOB.  Past Medical History:  COPD dCHF  Significant Hospital Events:  Admit 3/5 Transfer PCCM 3/6   Consults:  PCCM  Procedures:  n/a  Significant Diagnostic Tests:  CXR 3/5 chronic LLL bronchitic changes CTA 3/5 Tree in bud RLL, no PE  Micro Data:  MRSA PCR negative  Antimicrobials:  CTX/Azithro 3/5 >> Remdesivir 3/5>>3/6 PLAN:to return to home with husband and self care.  Will follow for hhc needs.  Expected Discharge Plan: Home/Self Care Barriers to Discharge: Continued Medical Work up   Patient Goals and CMS Choice Patient states their goals for this hospitalization and ongoing recovery are:: on iv sedation      Expected Discharge Plan and Services Expected Discharge Plan: Home/Self Care   Discharge Planning Services: CM Consult   Living arrangements for the past 2 months: Single Family Home                                      Prior Living Arrangements/Services Living arrangements for the past 2 months: Single Family Home Lives with:: Spouse Patient language and need for interpreter reviewed:: Yes        Need for Family Participation in Patient Care: Yes (Comment) Care  giver support system in place?: Yes (comment)   Criminal Activity/Legal Involvement Pertinent to Current Situation/Hospitalization: No - Comment as needed  Activities of Daily Living Home Assistive Devices/Equipment: None ADL Screening (condition at time of admission) Patient's cognitive ability adequate to safely complete daily activities?: Yes Is the patient deaf or have difficulty hearing?: No Does the patient have difficulty seeing, even when wearing glasses/contacts?: No Does the patient have difficulty concentrating, remembering, or making decisions?: No Patient able to express need for assistance with ADLs?: Yes Does the patient have difficulty dressing or bathing?: No Independently performs ADLs?: Yes (appropriate for developmental age) Does the patient have difficulty walking or climbing stairs?: No Weakness of Legs: None Weakness of Arms/Hands: None  Permission Sought/Granted                  Emotional Assessment Appearance:: Appears stated age Attitude/Demeanor/Rapport: Unable to Assess Affect (typically observed): Unable to Assess Orientation: : Fluctuating Orientation (Suspected and/or reported Sundowners) Alcohol / Substance Use: Not Applicable Psych Involvement: No (comment)  Admission diagnosis:  Respiratory distress [R06.03] COVID-19 [U07.1] Patient Active Problem List   Diagnosis Date Noted  . Respiratory distress   . COVID-19 11/11/2020  . Aortic stenosis 09/07/2020  . Acute on chronic heart failure with preserved ejection fraction (HFpEF) (HCC) 09/07/2020  . Osteoarthritis of left hip 02/18/2019  . Chronic respiratory failure with  hypoxia (HCC) 05/20/2018  . Chronic pain 05/20/2018  . Tobacco abuse 05/20/2018  . Essential hypertension   . GERD (gastroesophageal reflux disease)   . COPD (chronic obstructive pulmonary disease) (HCC)   . Asthma   . Obesity due to excess calories   . Lumbar spinal stenosis 04/03/2012    Class: Diagnosis of  . COPD  exacerbation (HCC) 05/31/2010   PCP:  Nichole Inch, MD Pharmacy:   [facility] [phone] - [place], [place] - [address] [facility] [address] [place] [address] Phone: [phone] Fax: [phone]  [facility] [place], [place] - [address] [place] [address] Phone: [phone] Fax: [phone]     Social Determinants of Health (SDOH) Interventions    Readmission Risk Interventions No flowsheet data found.

## 2020-11-13 NOTE — Progress Notes (Addendum)
Patient is NPO but insisting on having sips of water. BiPAP intact at this this time. This RN and RRT [place] at bedside and provided education on aspiration risk as it can be deadly. Pt anxious and insisted because "my mouth is so dry". Pt did not verbalize understanding.  Will continue to monitor.

## 2020-11-14 ENCOUNTER — Inpatient Hospital Stay (HOSPITAL_COMMUNITY): Payer: Medicare Other

## 2020-11-14 DIAGNOSIS — Z66 Do not resuscitate: Secondary | ICD-10-CM | POA: Diagnosis not present

## 2020-11-14 DIAGNOSIS — U071 COVID-19: Secondary | ICD-10-CM | POA: Diagnosis not present

## 2020-11-14 DIAGNOSIS — J9602 Acute respiratory failure with hypercapnia: Secondary | ICD-10-CM

## 2020-11-14 DIAGNOSIS — J9601 Acute respiratory failure with hypoxia: Secondary | ICD-10-CM

## 2020-11-14 LAB — CBC WITH DIFFERENTIAL/PLATELET
Abs Immature Granulocytes: 0.2 10*3/uL — ABNORMAL HIGH (ref 0.00–0.07)
Basophils Absolute: 0.1 10*3/uL (ref 0.0–0.1)
Basophils Relative: 1 %
Eosinophils Absolute: 0 10*3/uL (ref 0.0–0.5)
Eosinophils Relative: 0 %
HCT: 41.8 % (ref 36.0–46.0)
Hemoglobin: 12.1 g/dL (ref 12.0–15.0)
Immature Granulocytes: 2 %
Lymphocytes Relative: 6 %
Lymphs Abs: 0.6 10*3/uL — ABNORMAL LOW (ref 0.7–4.0)
MCH: 26.4 pg (ref 26.0–34.0)
MCHC: 28.9 g/dL — ABNORMAL LOW (ref 30.0–36.0)
MCV: 91.1 fL (ref 80.0–100.0)
Monocytes Absolute: 0.3 10*3/uL (ref 0.1–1.0)
Monocytes Relative: 3 %
Neutro Abs: 9.5 10*3/uL — ABNORMAL HIGH (ref 1.7–7.7)
Neutrophils Relative %: 88 %
Platelets: 246 10*3/uL (ref 150–400)
RBC: 4.59 MIL/uL (ref 3.87–5.11)
RDW: 15.1 % (ref 11.5–15.5)
WBC: 10.7 10*3/uL — ABNORMAL HIGH (ref 4.0–10.5)
nRBC: 0 % (ref 0.0–0.2)

## 2020-11-14 LAB — COMPREHENSIVE METABOLIC PANEL
ALT: 22 U/L (ref 0–44)
AST: 22 U/L (ref 15–41)
Albumin: 3.3 g/dL — ABNORMAL LOW (ref 3.5–5.0)
Alkaline Phosphatase: 49 U/L (ref 38–126)
Anion gap: 12 (ref 5–15)
BUN: 36 mg/dL — ABNORMAL HIGH (ref 6–20)
CO2: 38 mmol/L — ABNORMAL HIGH (ref 22–32)
Calcium: 9.1 mg/dL (ref 8.9–10.3)
Chloride: 94 mmol/L — ABNORMAL LOW (ref 98–111)
Creatinine, Ser: 0.62 mg/dL (ref 0.44–1.00)
GFR, Estimated: >60 mL/min (ref >60)
Glucose, Bld: 104 mg/dL — ABNORMAL HIGH (ref 70–99)
Potassium: 4.5 mmol/L (ref 3.5–5.1)
Sodium: 144 mmol/L (ref 135–145)
Total Bilirubin: 0.7 mg/dL (ref 0.3–1.2)
Total Protein: 6.3 g/dL — ABNORMAL LOW (ref 6.5–8.1)

## 2020-11-14 LAB — PHOSPHORUS: Phosphorus: 5.2 mg/dL — ABNORMAL HIGH (ref 2.5–4.6)

## 2020-11-14 LAB — D-DIMER, QUANTITATIVE: D-Dimer, Quant: 0.65 ug/mL-FEU — ABNORMAL HIGH (ref 0.00–0.50)

## 2020-11-14 LAB — C-REACTIVE PROTEIN: CRP: 3 mg/dL — ABNORMAL HIGH (ref <1.0)

## 2020-11-14 LAB — FERRITIN: Ferritin: 139 ng/mL (ref 11–307)

## 2020-11-14 LAB — MAGNESIUM: Magnesium: 2.6 mg/dL — ABNORMAL HIGH (ref 1.7–2.4)

## 2020-11-14 NOTE — Progress Notes (Signed)
PROGRESS NOTE    [name]  IZT:[id number] DOB: 1966/07/29 DOA: 11/11/2020 PCP: [name], MD   Chief Complaint  Patient presents with  . Respiratory Distress  . Pneumonia    Brief Narrative:   55 YEAR old lady with h/o COPD, chronic diastolic heart failure, chronic hypoxic respiratory failure on 4 lit of [place] oxygen, OSA, presents to ED with sob, non compliant to BIPAP, was found to have pneumonia.  She was started on IV steroids, placed on bipap and admitted for further evaluation. She received Actemra, CT chest does not show any acute pulm embolism. She has completed remdesivir . Plan to continue with prednisone as per pccm recommendations.    Assessment & Plan:   Active Problems:   COVID-19   Respiratory distress   DNR (do not resuscitate)   Acute respiratory failure (HCC)   Acute on chronic hypoxic respiratory failure sec to COVID 19 /copd exacerbation: - completed actemra and remdesivir.  Continue with prednisone taper.  bipap at night. And taper oxygen as tolerated.  Will need PT evaluation in 1 to 2 days.     Chronic diastolic heart failure:  - she appears compensated.    Hypertension:  BP parameters slightly elevated.  Restart her meds today.     DVT prophylaxis: (Lovenox) Code Status: DNR Family Communication: none at bedside.  Disposition:   Status is: Inpatient  Remains inpatient appropriate because:Unsafe d/c plan, IV treatments appropriate due to intensity of illness or inability to take PO and Inpatient level of care appropriate due to severity of illness   Dispo: The patient is from: Home              Anticipated d/c is to: pending.               Patient currently is not medically stable to d/c.   Difficult to place patient No       Consultants:   PCCM    Procedures: None.    Antimicrobials: rocephin and zithromax    Subjective: No chest pain , breathing has improved.   Objective: Vitals:   11/14/20 0400  11/14/20 0750 11/14/20 0800 11/14/20 0900  BP: (!) 149/89   (!) 143/77  Pulse: 71   75  Resp: 18   15  Temp:   (!) 97.3 F (36.3 C)   TempSrc:   Axillary   SpO2: 90% 94%  95%  Weight:      Height:        Intake/Output Summary (Last 24 hours) at 11/14/2020 1057 Last data filed at 11/14/2020 1000 Gross per 24 hour  Intake 575.68 ml  Output 1750 ml  Net -1174.32 ml   Filed Weights   11/11/20 1640 11/11/20 1727  Weight: 84.6 kg 86 kg    Examination:  General exam: alert not in distress.  Respiratory system: scattered wheezing heard , on 9 lit [place] oxygen.  Cardiovascular system: S1 & S2 heard, RRR. No pedal edema. Gastrointestinal system: Abdomen is nondistended, soft and nontender. Normal bowel sounds heard. Central nervous system: Alert and oriented. No focal neurological deficits. Extremities:  Scattered cyanosis Skin: No rashes, lesions or ulcers Psychiatry:  Mood & affect appropriate.     Data Reviewed: I have personally reviewed following labs and imaging studies  CBC: Recent Labs  Lab 11/11/20 1216 11/12/20 0243 11/12/20 0844 11/13/20 0343 11/14/20 0527  WBC 18.6* 11.4*  --  10.5 10.7*  NEUTROABS 15.3* 10.6*  --  9.4* 9.5*  HGB 12.9  12.0 12.2 11.9* 12.1  HCT 43.9 40.5 36.0 41.5 41.8  MCV 89.2 89.6  --  91.2 91.1  PLT 222 231  --  239 246    Basic Metabolic Panel: Recent Labs  Lab 11/11/20 1216 11/12/20 0243 11/12/20 0844 11/13/20 0343 11/14/20 0527  NA 138 141 139 143 144  K 3.9 4.6 4.5 4.4 4.5  CL 95* 94*  --  94* 94*  CO2 34* 35*  --  41* 38*  GLUCOSE 73 150*  --  160* 104*  BUN 13 16  --  27* 36*  CREATININE 0.58 0.64  --  0.68 0.62  CALCIUM 8.1* 8.9  --  8.9 9.1  MG  --  2.3  --  2.5* 2.6*  PHOS  --  5.0*  --  4.9* 5.2*    GFR: Estimated Creatinine Clearance: 82.5 mL/min (by C-G formula based on SCr of 0.62 mg/dL).  Liver Function Tests: Recent Labs  Lab 11/12/20 0243 11/13/20 0343 11/14/20 0527  AST 15 16 22   ALT 18 19 22    ALKPHOS 52 49 49  BILITOT 0.6 0.4 0.7  PROT 6.4* 6.4* 6.3*  ALBUMIN 3.3* 3.2* 3.3*    CBG: Recent Labs  Lab 11/12/20 0332 11/12/20 0823 11/12/20 1225 11/12/20 1703 11/13/20 0336  GLUCAP 147* 154* 146* 182* 170*     Recent Results (from the past 240 hour(s))  Resp Panel by RT-PCR (Flu A&B, Covid) Nasopharyngeal Swab     Status: Abnormal   Collection Time: 11/11/20 12:16 PM   Specimen: Nasopharyngeal Swab; Nasopharyngeal(NP) swabs in vial transport medium  Result Value Ref Range Status   SARS Coronavirus 2 by RT PCR POSITIVE (A) NEGATIVE Final    Comment: RESULT CALLED TO, READ BACK BY AND VERIFIED WITH: HENRY AT 1322 ON 11/11/2020 BY JPM (NOTE) SARS-CoV-2 target nucleic acids are DETECTED.  The SARS-CoV-2 RNA is generally detectable in upper respiratory specimens during the acute phase of infection. Positive results are indicative of the presence of the identified virus, but do not rule out bacterial infection or co-infection with other pathogens not detected by the test. Clinical correlation with patient history and other diagnostic information is necessary to determine patient infection status. The expected result is Negative.  Fact Sheet for Patients: 01/11/21  Fact Sheet for Healthcare Providers: 01/11/2021  This test is not yet approved or cleared by the [employer] FDA and  has been authorized for detection and/or diagnosis of SARS-CoV-2 by FDA under an Emergency Use Authorization (EUA).  This EUA will remain in effect (meaning this test can be  used) for the duration of  the COVID-19 declaration under Section 564(b)(1) of the Act, 21 U.S.C. section 360bbb-3(b)(1), unless the authorization is terminated or revoked sooner.     Influenza A by PCR NEGATIVE NEGATIVE Final   Influenza B by PCR NEGATIVE NEGATIVE Final    Comment: (NOTE) The Xpert Xpress SARS-CoV-2/FLU/RSV plus assay is intended as  an aid in the diagnosis of influenza from Nasopharyngeal swab specimens and should not be used as a sole basis for treatment. Nasal washings and aspirates are unacceptable for Xpert Xpress SARS-CoV-2/FLU/RSV testing.  Fact Sheet for Patients: [employer]it  Fact Sheet for Healthcare Providers: [place]  This test is not yet approved or cleared by the [employer] FDA and has been authorized for detection and/or diagnosis of SARS-CoV-2 by FDA under an Emergency Use Authorization (EUA). This EUA will remain in effect (meaning this test can be used) for the duration of the COVID-19  declaration under Section 564(b)(1) of the Act, 21 U.S.C. section 360bbb-3(b)(1), unless the authorization is terminated or revoked.  Performed at [facility], [address] [place], [place] [address]   MRSA PCR Screening     Status: None   Collection Time: 11/11/20  4:55 PM   Specimen: Nasopharyngeal  Result Value Ref Range Status   MRSA by PCR NEGATIVE NEGATIVE Final    Comment:        The GeneXpert MRSA Assay (FDA approved for NASAL specimens only), is one component of a comprehensive MRSA colonization surveillance program. It is not intended to diagnose MRSA infection nor to guide or monitor treatment for MRSA infections. Performed at [facility], [address], [place], [place] [address]          Radiology Studies: DG CHEST PORT 1 VIEW  Result Date: 11/12/2020 CLINICAL DATA:  COVID positive.  Shortness of breath. EXAM: PORTABLE CHEST 1 VIEW COMPARISON:  Radiograph and CT yesterday. FINDINGS: Emphysema and hyperinflation with central bronchial thickening. Ill-defined patchy bibasilar opacities, similar in the right and worsening on the left. Stable heart size and mediastinal contours. No pneumothorax or pleural effusion. Surgical change in the left proximal humerus and lower cervical  spine. IMPRESSION: 1. Ill-defined patchy bibasilar opacities, similar in the right and worsening on the left, pattern consistent with pneumonia. 2. Emphysema, bronchial thickening, and hyperinflation. Electronically Signed   By: Narda Rutherford M.D.   On: 11/12/2020 15:07        Scheduled Meds: . vitamin C  500 mg Oral Daily  . Chlorhexidine Gluconate Cloth  6 each Topical Daily  . DULoxetine  30 mg Oral Daily  . enoxaparin (LOVENOX) injection  40 mg Subcutaneous Q24H  . gabapentin  400 mg Oral TID  . Ipratropium-Albuterol  1 puff Inhalation Q6H  . mouth rinse  15 mL Mouth Rinse BID  . methylPREDNISolone (SOLU-MEDROL) injection  80 mg Intravenous Q12H   Followed by  . [START ON 11/15/2020] predniSONE  50 mg Oral Daily  .  morphine injection  4 mg Intravenous Q3H  . nicotine  14 mg Transdermal Daily  . sodium chloride flush  10-40 mL Intracatheter Q12H  . zinc sulfate  220 mg Oral Daily   Continuous Infusions: . azithromycin Stopped (11/13/20 1619)  . cefTRIAXone (ROCEPHIN)  IV Stopped (11/13/20 1516)  . dexmedetomidine (PRECEDEX) IV infusion 0.2 mcg/kg/hr (11/14/20 1000)     LOS: 3 days       Kathlen Mody, MD [facility]   To contact the attending provider between 7A-7P or the covering provider during after hours 7P-7A, please log into the web site [email or web address] and access using universal [facility] [place] password for that web site. If you do not have the password, please call the hospital operator.  11/14/2020, 10:57 AM

## 2020-11-14 NOTE — CV Procedure (Signed)
BLE venous duplex completed.  Results can be found under chart review under CV PROC. 11/14/2020 12:19 PM Amica Harron RVT, RDMS

## 2020-11-14 NOTE — Progress Notes (Signed)
PT states she is breathing ok at this time- A&O x 4. PT admits to having OSA and is non compliant with PAP therapy at home (uses 4 LPM 02 at night and PRN day). PT agrees to notify staff if she wishes to nap during day- RN aware to notify RT if PT appears drowsy or napping.

## 2020-11-14 NOTE — Progress Notes (Signed)
NAME:  [name], MRN:  [id number], DOB:  1966-08-21, LOS: 3 ADMISSION DATE:  11/11/2020, CONSULTATION DATE:  11/14/20 REFERRING MD:  TR[facility] CHIEF COMPLAINT:  short of breath   Brief History:  55 y/oF with end stage COPD admitted 3/5 with one week hx of SOB. Contacted pulmonary doctor Kathryne Sharper) and started on levofloxacin and oral prednisone. WOB worsened prompting presentation. In ED, COVID positive, CXR similar to one prior 08/2020 with bilateral lower lung field bronchitic changes. Started on steroids, given actemra, Abx for CAP as well as remdesivir. Been on BiPAP for 24 hours given increased WOB.  Past Medical History:  COPD dCHF  Significant Hospital Events:  3/05 Admit with SOB, COVID positive 3/06 PCCM consulted  3/07 Precedex stopped, home meds resumed (ms contin, gabapentin) - Afebrile  BiPAP 50%, PEEP 5 Glucose range 146-182 Pt asking to come off bipap, wants to go back to [place] O2, requesting to eat / drink RN reports pt very anxious   Consults:  PCCM  Procedures:    Significant Diagnostic Tests:   CXR 3/5 >> chronic LLL bronchitic changes  CTA 3/5 >> Tree in bud RLL, no PE  Micro Data:  MRSA PCR 3/5 >> negative COVID 3/5 >> positive   Antimicrobials:  CTX 3/5 >>  Azithro 3/5 >> Remdesivir 3/5 >> 3/6  Interim History / Subjective:   3/8 - much better after scheduled morphine4mg  Q3h - total 32mg  (baseline MEDD 60mg ). Used bipap earlier. Now on o2 -  9L HFNC. Off precedx.   CNS meds here include - morphine , cymbalta, niotine patch, gabaoentin  Objective   Blood pressure (!) 152/92, pulse (!) 111, temperature (!) 96.3 F (35.7 C), temperature source Oral, resp. rate (!) 35, height 5\' 3"  (1.6 m), weight 86 kg, SpO2 92 %.    Vent Mode: PCV FiO2 (%):  [30 %] 30 % Set Rate:  [10 bmp] 10 bmp PEEP:  [5 cmH20] 5 cmH20   Intake/Output Summary (Last 24 hours) at 11/14/2020 1505 Last data filed at 11/14/2020 1200 Gross per 24 hour  Intake 579.77 ml  Output  600 ml  Net -20.23 ml   Filed Weights   11/11/20 1640 11/11/20 1727  Weight: 84.6 kg 86 kg    Examination: General Appearance:  Looks remarkably bettter OBESE - +. Looks Head:  Normocephalic, without obvious abnormality, atraumatic Eyes:  PERRL - yes, conjunctiva/corneas - muddy     Ears:  Normal external ear canals, both ears Nose:  G tube - no but has 9L HFNC Throat:  ETT TUBE - no , OG tube - no Neck:  Supple,  No enlargement/tenderness/nodules Lungs: Clear to auscultation bilaterally, Heart:  S1 and S2 normal, no murmur, CVP - c.  Pressors - no Abdomen:  Soft, no masses, no organomegaly Genitalia / Rectal:  Not done Extremities:  Extremities- intact Skin:  ntact in exposed areas . Sacral area - not examined Neurologic:  Sedation - morphine scheuled -> RASS - +! 01/14/2021 Moves all 4s - yes. CAM-ICU - neg . Orientation - x3+    LABS    PULMONARY Recent Labs  Lab 11/11/20 1209 11/12/20 0844  PHART 7.345* 7.458*  PCO2ART 73.1* 62.4*  PO2ART 152* 144*  HCO3 38.9* 44.2*  TCO2  --  46*  O2SAT 99.1 99.0    CBC Recent Labs  Lab 11/12/20 0243 11/12/20 0844 11/13/20 0343 11/14/20 0527  HGB 12.0 12.2 11.9* 12.1  HCT 40.5 36.0 41.5 41.8  WBC 11.4*  --  10.5 10.7*  PLT 231  --  239 246    COAGULATION No results for input(s): INR in the last 168 hours.  CARDIAC  No results for input(s): TROPONINI in the last 168 hours. No results for input(s): PROBNP in the last 168 hours.   CHEMISTRY Recent Labs  Lab 11/11/20 1216 11/12/20 0243 11/12/20 0844 11/13/20 0343 11/14/20 0527  NA 138 141 139 143 144  K 3.9 4.6 4.5 4.4 4.5  CL 95* 94*  --  94* 94*  CO2 34* 35*  --  41* 38*  GLUCOSE 73 150*  --  160* 104*  BUN 13 16  --  27* 36*  CREATININE 0.58 0.64  --  0.68 0.62  CALCIUM 8.1* 8.9  --  8.9 9.1  MG  --  2.3  --  2.5* 2.6*  PHOS  --  5.0*  --  4.9* 5.2*   Estimated Creatinine Clearance: 82.5 mL/min (by C-G formula based on SCr of 0.62 mg/dL).   LIVER Recent  Labs  Lab 11/12/20 0243 11/13/20 0343 11/14/20 0527  AST 15 16 22   ALT 18 19 22   ALKPHOS 52 49 49  BILITOT 0.6 0.4 0.7  PROT 6.4* 6.4* 6.3*  ALBUMIN 3.3* 3.2* 3.3*     INFECTIOUS Recent Labs  Lab 11/11/20 1216  PROCALCITON <0.10     ENDOCRINE CBG (last 3)  Recent Labs    11/12/20 1225 11/12/20 1703 11/13/20 0336  GLUCAP 146* 182* 170*         IMAGING x48h  - image(s) personally visualized  -   highlighted in bold VAS US LOWER EXTREMITY VENOUS (DVT)  Result Date: 11/14/2020  Lower Venous DVT Study Indications: Covid+.  Comparison Study: Previous exam 09/06/20 - negative Performing Technologist: Ernestene MentionJody Hill  Examination Guidelines: A complete evaluation includes B-mode imaging, spectral Doppler, color Doppler, and power Doppler as needed of all accessible portions of each vessel. Bilateral testing is considered an integral part of a complete examination. Limited examinations for reoccurring indications may be performed as noted. The reflux portion of the exam is performed with the patient in reverse Trendelenburg.  +---------+---------------+---------+-----------+----------+--------------+ RIGHT    CompressibilityPhasicitySpontaneityPropertiesThrombus Aging +---------+---------------+---------+-----------+----------+--------------+ CFV      Full           Yes      Yes                                 +---------+---------------+---------+-----------+----------+--------------+ SFJ      Full                                                        +---------+---------------+---------+-----------+----------+--------------+ FV Prox  Full           Yes      Yes                                 +---------+---------------+---------+-----------+----------+--------------+ FV Mid   Full           Yes      Yes                                 +---------+---------------+---------+-----------+----------+--------------+  FV DistalFull           Yes      Yes                                  +---------+---------------+---------+-----------+----------+--------------+ PFV      Full                                                        +---------+---------------+---------+-----------+----------+--------------+ POP      Full           Yes      Yes                                 +---------+---------------+---------+-----------+----------+--------------+ PTV      Full                                                        +---------+---------------+---------+-----------+----------+--------------+ PERO     Full                                                        +---------+---------------+---------+-----------+----------+--------------+   +---------+---------------+---------+-----------+----------+--------------+ LEFT     CompressibilityPhasicitySpontaneityPropertiesThrombus Aging +---------+---------------+---------+-----------+----------+--------------+ CFV      Full           Yes      Yes                                 +---------+---------------+---------+-----------+----------+--------------+ SFJ      Full                                                        +---------+---------------+---------+-----------+----------+--------------+ FV Prox  Full           Yes      Yes                                 +---------+---------------+---------+-----------+----------+--------------+ FV Mid   Full           Yes      Yes                                 +---------+---------------+---------+-----------+----------+--------------+ FV DistalFull           Yes      Yes                                 +---------+---------------+---------+-----------+----------+--------------+ PFV      Full                                                        +---------+---------------+---------+-----------+----------+--------------+  POP      Full           Yes      Yes                                  +---------+---------------+---------+-----------+----------+--------------+ PTV      Full                                                        +---------+---------------+---------+-----------+----------+--------------+ PERO     Full                                                        +---------+---------------+---------+-----------+----------+--------------+     Summary: BILATERAL: - No evidence of deep vein thrombosis seen in the lower extremities, bilaterally. - No evidence of superficial venous thrombosis in the lower extremities, bilaterally. -No evidence of popliteal cyst, bilaterally.   *See table(s) above for measurements and observations.    Preliminary         Resolved Hospital Problem list     Assessment & Plan:   Acute on Chronic Hypercapnic / Hypoxemic Respiratory Failure: In setting of COPD, likely OHS with acute exacerbation related to COVID 19 s/p actemra   11/14/2020 - remarkably better with bipap and scheduled morphine started at 50% of home dosing  Plan - BiPAP QHS + day time 1 application -> check abg 11/15/20 and also 24h prior to dc   - might needs bipap for dc home if hypercapnic - -O2 for sats >92% at rest -continue steroids at prednisone  per day and 2-3 week taper to baseline dose - dc ceftriaxone / azithromycin  (PCT < 0.1)  - check urine strep /leg -morphine IV shcedled for air hunger  -   - start po 11/15/20 per triad  - scheduled BD  - check alpha 1 AT phenotype - advanced COPD at age 87      Move to progressive. Needs CCM followup as below  CCM will sign off but please call back if needed closer to dc if bipap fo hime coorination is needed     Future Appointments  Date Time Provider Department Center  11/28/2020  2:30 PM Parrett, Virgel Bouquet, NP [facility] None  12/14/2020 10:00 AM Levert Feinstein, MD [facility] None     Best practice (evaluated daily)  Per triad  Goals of Care:  Last date of multidisciplinary goals of care  discussion: 11/12/20 Family and staff present: MD, patient, husband Summary of discussion: End stage COPD, acute on chronic respiratory failure. Fear ventilator is a one way ticket, unable to get off vent. She would not want prolonged life support. Decided to be DNR.  Follow up goals of care discussion due: 11/18/20 Code Status: DNR bu full medical care      SIGNATURE    Dr. Kalman Shan, M.D., F.C.C.P,  Pulmonary and Critical Care Medicine Staff Physician, [facility] Director - Interstitial Lung Disease  Program  Pulmonary Fibrosis [facility] at [facility] [place], [place], [address]  Pager: [phone], If no answer  OR  between  19:00-7:00h: page [phone] Telephone (clinical office): [phone] Telephone (research): [phone]  3:06 PM 11/14/2020    LABS    PULMONARY Recent Labs  Lab 11/11/20 1209 11/12/20 0844  PHART 7.345* 7.458*  PCO2ART 73.1* 62.4*  PO2ART 152* 144*  HCO3 38.9* 44.2*  TCO2  --  46*  O2SAT 99.1 99.0    CBC Recent Labs  Lab 11/12/20 0243 11/12/20 0844 11/13/20 0343 11/14/20 0527  HGB 12.0 12.2 11.9* 12.1  HCT 40.5 36.0 41.5 41.8  WBC 11.4*  --  10.5 10.7*  PLT 231  --  239 246    COAGULATION No results for input(s): INR in the last 168 hours.  CARDIAC  No results for input(s): TROPONINI in the last 168 hours. No results for input(s): PROBNP in the last 168 hours.   CHEMISTRY Recent Labs  Lab 11/11/20 1216 11/12/20 0243 11/12/20 0844 11/13/20 0343 11/14/20 0527  NA 138 141 139 143 144  K 3.9 4.6 4.5 4.4 4.5  CL 95* 94*  --  94* 94*  CO2 34* 35*  --  41* 38*  GLUCOSE 73 150*  --  160* 104*  BUN 13 16  --  27* 36*  CREATININE 0.58 0.64  --  0.68 0.62  CALCIUM 8.1* 8.9  --  8.9 9.1  MG  --  2.3  --  2.5* 2.6*  PHOS  --  5.0*  --  4.9* 5.2*   Estimated Creatinine Clearance: 82.5 mL/min (by C-G formula based on SCr of 0.62 mg/dL).   LIVER Recent Labs  Lab  11/12/20 0243 11/13/20 0343 11/14/20 0527  AST 15 16 22   ALT 18 19 22   ALKPHOS 52 49 49  BILITOT 0.6 0.4 0.7  PROT 6.4* 6.4* 6.3*  ALBUMIN 3.3* 3.2* 3.3*     INFECTIOUS Recent Labs  Lab 11/11/20 1216  PROCALCITON <0.10     ENDOCRINE CBG (last 3)  Recent Labs    11/12/20 1225 11/12/20 1703 11/13/20 0336  GLUCAP 146* 182* 170*         IMAGING x48h  - image(s) personally visualized  -   highlighted in bold VAS 01/12/21 LOWER EXTREMITY VENOUS (DVT)  Result Date: 11/14/2020  Lower Venous DVT Study Indications: Covid+.  Comparison Study: Previous exam 09/06/20 - negative Performing Technologist: 01/14/2021  Examination Guidelines: A complete evaluation includes B-mode imaging, spectral Doppler, color Doppler, and power Doppler as needed of all accessible portions of each vessel. Bilateral testing is considered an integral part of a complete examination. Limited examinations for reoccurring indications may be performed as noted. The reflux portion of the exam is performed with the patient in reverse Trendelenburg.  +---------+---------------+---------+-----------+----------+--------------+ RIGHT    CompressibilityPhasicitySpontaneityPropertiesThrombus Aging +---------+---------------+---------+-----------+----------+--------------+ CFV      Full           Yes      Yes                                 +---------+---------------+---------+-----------+----------+--------------+ SFJ      Full                                                        +---------+---------------+---------+-----------+----------+--------------+ FV Prox  Full  Yes      Yes                                 +---------+---------------+---------+-----------+----------+--------------+ FV Mid   Full           Yes      Yes                                 +---------+---------------+---------+-----------+----------+--------------+ FV DistalFull           Yes      Yes                                  +---------+---------------+---------+-----------+----------+--------------+ PFV      Full                                                        +---------+---------------+---------+-----------+----------+--------------+ POP      Full           Yes      Yes                                 +---------+---------------+---------+-----------+----------+--------------+ PTV      Full                                                        +---------+---------------+---------+-----------+----------+--------------+ PERO     Full                                                        +---------+---------------+---------+-----------+----------+--------------+   +---------+---------------+---------+-----------+----------+--------------+ LEFT     CompressibilityPhasicitySpontaneityPropertiesThrombus Aging +---------+---------------+---------+-----------+----------+--------------+ CFV      Full           Yes      Yes                                 +---------+---------------+---------+-----------+----------+--------------+ SFJ      Full                                                        +---------+---------------+---------+-----------+----------+--------------+ FV Prox  Full           Yes      Yes                                 +---------+---------------+---------+-----------+----------+--------------+ FV Mid   Full           Yes      Yes                                 +---------+---------------+---------+-----------+----------+--------------+  FV DistalFull           Yes      Yes                                 +---------+---------------+---------+-----------+----------+--------------+ PFV      Full                                                        +---------+---------------+---------+-----------+----------+--------------+ POP      Full           Yes      Yes                                  +---------+---------------+---------+-----------+----------+--------------+ PTV      Full                                                        +---------+---------------+---------+-----------+----------+--------------+ PERO     Full                                                        +---------+---------------+---------+-----------+----------+--------------+     Summary: BILATERAL: - No evidence of deep vein thrombosis seen in the lower extremities, bilaterally. - No evidence of superficial venous thrombosis in the lower extremities, bilaterally. -No evidence of popliteal cyst, bilaterally.   *See table(s) above for measurements and observations.    Preliminary

## 2020-11-15 DIAGNOSIS — J9601 Acute respiratory failure with hypoxia: Secondary | ICD-10-CM | POA: Diagnosis not present

## 2020-11-15 DIAGNOSIS — R0603 Acute respiratory distress: Secondary | ICD-10-CM | POA: Diagnosis not present

## 2020-11-15 DIAGNOSIS — J9602 Acute respiratory failure with hypercapnia: Secondary | ICD-10-CM | POA: Diagnosis not present

## 2020-11-15 LAB — CBC WITH DIFFERENTIAL/PLATELET
Abs Immature Granulocytes: 0.76 10*3/uL — ABNORMAL HIGH (ref 0.00–0.07)
Basophils Absolute: 0.2 10*3/uL — ABNORMAL HIGH (ref 0.0–0.1)
Basophils Relative: 1 %
Eosinophils Absolute: 0 10*3/uL (ref 0.0–0.5)
Eosinophils Relative: 0 %
HCT: 39.5 % (ref 36.0–46.0)
Hemoglobin: 11.7 g/dL — ABNORMAL LOW (ref 12.0–15.0)
Immature Granulocytes: 5 %
Lymphocytes Relative: 14 %
Lymphs Abs: 2 10*3/uL (ref 0.7–4.0)
MCH: 26 pg (ref 26.0–34.0)
MCHC: 29.6 g/dL — ABNORMAL LOW (ref 30.0–36.0)
MCV: 87.8 fL (ref 80.0–100.0)
Monocytes Absolute: 1.3 10*3/uL — ABNORMAL HIGH (ref 0.1–1.0)
Monocytes Relative: 9 %
Neutro Abs: 10 10*3/uL — ABNORMAL HIGH (ref 1.7–7.7)
Neutrophils Relative %: 71 %
Platelets: 234 10*3/uL (ref 150–400)
RBC: 4.5 MIL/uL (ref 3.87–5.11)
RDW: 14.7 % (ref 11.5–15.5)
WBC: 14.2 10*3/uL — ABNORMAL HIGH (ref 4.0–10.5)
nRBC: 0 % (ref 0.0–0.2)

## 2020-11-15 LAB — COMPREHENSIVE METABOLIC PANEL
ALT: 21 U/L (ref 0–44)
AST: 21 U/L (ref 15–41)
Albumin: 3.2 g/dL — ABNORMAL LOW (ref 3.5–5.0)
Alkaline Phosphatase: 46 U/L (ref 38–126)
Anion gap: 8 (ref 5–15)
BUN: 22 mg/dL — ABNORMAL HIGH (ref 6–20)
CO2: 39 mmol/L — ABNORMAL HIGH (ref 22–32)
Calcium: 8.6 mg/dL — ABNORMAL LOW (ref 8.9–10.3)
Chloride: 91 mmol/L — ABNORMAL LOW (ref 98–111)
Creatinine, Ser: 0.53 mg/dL (ref 0.44–1.00)
GFR, Estimated: >60 mL/min (ref >60)
Glucose, Bld: 69 mg/dL — ABNORMAL LOW (ref 70–99)
Potassium: 3.9 mmol/L (ref 3.5–5.1)
Sodium: 138 mmol/L (ref 135–145)
Total Bilirubin: 0.8 mg/dL (ref 0.3–1.2)
Total Protein: 5.9 g/dL — ABNORMAL LOW (ref 6.5–8.1)

## 2020-11-15 LAB — BLOOD GAS, ARTERIAL
Acid-Base Excess: 13.5 mmol/L — ABNORMAL HIGH (ref 0.0–2.0)
Bicarbonate: 40.8 mmol/L — ABNORMAL HIGH (ref 20.0–28.0)
FIO2: 30
O2 Saturation: 93.9 %
Patient temperature: 98.6
pCO2 arterial: 66.6 mmHg (ref 32.0–48.0)
pH, Arterial: 7.404 (ref 7.350–7.450)
pO2, Arterial: 71 mmHg — ABNORMAL LOW (ref 83.0–108.0)

## 2020-11-15 LAB — MAGNESIUM: Magnesium: 2.5 mg/dL — ABNORMAL HIGH (ref 1.7–2.4)

## 2020-11-15 LAB — PHOSPHORUS: Phosphorus: 2.6 mg/dL (ref 2.5–4.6)

## 2020-11-15 LAB — D-DIMER, QUANTITATIVE: D-Dimer, Quant: 0.69 ug/mL-FEU — ABNORMAL HIGH (ref 0.00–0.50)

## 2020-11-15 LAB — FERRITIN: Ferritin: 124 ng/mL (ref 11–307)

## 2020-11-15 LAB — C-REACTIVE PROTEIN: CRP: 1.3 mg/dL — ABNORMAL HIGH (ref <1.0)

## 2020-11-15 LAB — STREP PNEUMONIAE URINARY ANTIGEN: Strep Pneumo Urinary Antigen: NEGATIVE

## 2020-11-15 MED ORDER — TRAZODONE HCL 50 MG PO TABS
50.0000 mg | ORAL_TABLET | Freq: Every evening | ORAL | Status: DC | PRN
  Administered 2020-11-15 – 2020-11-19 (×4): 50 mg via ORAL
  Filled 2020-11-15 (×4): qty 1

## 2020-11-15 MED ORDER — TRAMADOL HCL 50 MG PO TABS
50.0000 mg | ORAL_TABLET | Freq: Once | ORAL | Status: AC
  Administered 2020-11-15: 50 mg via ORAL
  Filled 2020-11-15: qty 1

## 2020-11-15 MED ORDER — HYDROCODONE-ACETAMINOPHEN 5-325 MG PO TABS
1.0000 | ORAL_TABLET | ORAL | Status: DC | PRN
  Administered 2020-11-15: 2 via ORAL
  Administered 2020-11-16 (×2): 1 via ORAL
  Administered 2020-11-17 – 2020-11-20 (×6): 2 via ORAL
  Filled 2020-11-15 (×5): qty 2
  Filled 2020-11-15: qty 1
  Filled 2020-11-15 (×2): qty 2
  Filled 2020-11-15: qty 1
  Filled 2020-11-15: qty 2

## 2020-11-15 NOTE — Plan of Care (Signed)
  Problem: Health Behavior/Discharge Planning: Goal: Ability to manage health-related needs will improve Outcome: Progressing   Problem: Clinical Measurements: Goal: Respiratory complications will improve Outcome: Progressing   Problem: Clinical Measurements: Goal: Cardiovascular complication will be avoided Outcome: Progressing   Problem: Activity: Goal: Risk for activity intolerance will decrease Outcome: Progressing   Problem: Coping: Goal: Level of anxiety will decrease Outcome: Progressing   Problem: Pain Managment: Goal: General experience of comfort will improve Outcome: Progressing

## 2020-11-15 NOTE — Progress Notes (Addendum)
MD aware pt Critical value pCO2 66.6, no new orders received. Will continue to monitor.  Pt awake, alert and oriented x4.

## 2020-11-15 NOTE — Progress Notes (Signed)
PROGRESS NOTE    [name]  ZOX:[id number] DOB: 05/05/1966 DOA: 11/11/2020 PCP: [name], Michael C, MD   Chief Complaint  Patient presents with  . Respiratory Distress  . Pneumonia    Brief Narrative:   55 YEAR old lady with h/o COPD, chronic diastolic heart failure, chronic hypoxic respiratory failure on 4 lit of [place] oxygen, OSA, presents to ED with sob, non compliant to BIPAP, was found to have pneumonia.  She was started on IV steroids, placed on bipap and admitted for further evaluation. She received Actemra, CT chest does not show any acute pulm embolism. She has completed remdesivir . Plan to continue with prednisone as per pccm recommendations.  Pt seen and examined at bedside. We have started to wean her oxygen down to 7 lit/min today.  She reports feeling good, she denies any chest pain and wants to go home.    Assessment & Plan:   Active Problems:   COVID-19   Respiratory distress   DNR (do not resuscitate)   Acute respiratory failure (HCC)   Acute on chronic hypoxic respiratory failure sec to COVID 19 /copd exacerbation: - completed actemra and remdesivir.  Continue with prednisone taper.   And taper oxygen as tolerated. Currently on 7 lit/min.  Pt will need BIPAP at night,  at home for discharge.  Streptococcal antigen is negative.  CRP is improving.  Alpha 1 anti trypsin is pending.    Chronic diastolic heart failure:  - she appears compensated.    Hypertension:  BP parameters are optimal.    Leukocytosis  Probably from steroids.     DVT prophylaxis: (Lovenox) Code Status: DNR Family Communication: none at bedside.  Disposition:   Status is: Inpatient  Remains inpatient appropriate because:Unsafe d/c plan, IV treatments appropriate due to intensity of illness or inability to take PO and Inpatient level of care appropriate due to severity of illness   Dispo: The patient is from: Home              Anticipated d/c is to: pending.                Patient currently is not medically stable to d/c.   Difficult to place patient No       Consultants:   PCCM    Procedures: None.    Antimicrobials: rocephin and zithromax    Subjective: Breathing has improved.   Objective: Vitals:   11/14/20 2052 11/15/20 0037 11/15/20 0621 11/15/20 1200  BP: (!) 170/96 (!) 155/89 (!) 158/90 (!) 155/93  Pulse: 73 74 83 90  Resp: 18 18 18 19   Temp: 97.7 F (36.5 C)  97.9 F (36.6 C) 98.5 F (36.9 C)  TempSrc: Oral  Oral Oral  SpO2: 99% 95% 97% 99%  Weight: 88.4 kg     Height: 5\' 3"  (1.6 m)       Intake/Output Summary (Last 24 hours) at 11/15/2020 1555 Last data filed at 11/15/2020 1542 Gross per 24 hour  Intake 458.21 ml  Output 2545 ml  Net -2086.79 ml   Filed Weights   11/11/20 1640 11/11/20 1727 11/14/20 2052  Weight: 84.6 kg 86 kg 88.4 kg    Examination:  General exam: alert on 7l it of  oxygen.  Respiratory system: no wheezing heard, on 7 lit/min. Tachypnea present.  Cardiovascular system: S1S2, RRR, no JVD, no pedal edema.  Gastrointestinal system: Abdomen is soft, NT ND BS+ Central nervous system: Alert and oriented, non focal. Extremities: cyanosis in the feet,  warm feet.  Skin: No rashes.  Psychiatry:  Mood is appropriate.     Data Reviewed: I have personally reviewed following labs and imaging studies  CBC: Recent Labs  Lab 11/11/20 1216 11/12/20 0243 11/12/20 0844 11/13/20 0343 11/14/20 0527 11/15/20 0601  WBC 18.6* 11.4*  --  10.5 10.7* 14.2*  NEUTROABS 15.3* 10.6*  --  9.4* 9.5* 10.0*  HGB 12.9 12.0 12.2 11.9* 12.1 11.7*  HCT 43.9 40.5 36.0 41.5 41.8 39.5  MCV 89.2 89.6  --  91.2 91.1 87.8  PLT 222 231  --  239 246 234    Basic Metabolic Panel: Recent Labs  Lab 11/11/20 1216 11/12/20 0243 11/12/20 0844 11/13/20 0343 11/14/20 0527 11/15/20 0601  NA 138 141 139 143 144 138  K 3.9 4.6 4.5 4.4 4.5 3.9  CL 95* 94*  --  94* 94* 91*  CO2 34* 35*  --  41* 38* 39*  GLUCOSE 73 150*  --   160* 104* 69*  BUN 13 16  --  27* 36* 22*  CREATININE 0.58 0.64  --  0.68 0.62 0.53  CALCIUM 8.1* 8.9  --  8.9 9.1 8.6*  MG  --  2.3  --  2.5* 2.6* 2.5*  PHOS  --  5.0*  --  4.9* 5.2* 2.6    GFR: Estimated Creatinine Clearance: 83.8 mL/min (by C-G formula based on SCr of 0.53 mg/dL).  Liver Function Tests: Recent Labs  Lab 11/12/20 0243 11/13/20 0343 11/14/20 0527 11/15/20 0601  AST 15 16 22 21   ALT 18 19 22 21   ALKPHOS 52 49 49 46  BILITOT 0.6 0.4 0.7 0.8  PROT 6.4* 6.4* 6.3* 5.9*  ALBUMIN 3.3* 3.2* 3.3* 3.2*    CBG: Recent Labs  Lab 11/12/20 0332 11/12/20 0823 11/12/20 1225 11/12/20 1703 11/13/20 0336  GLUCAP 147* 154* 146* 182* 170*     Recent Results (from the past 240 hour(s))  Resp Panel by RT-PCR (Flu A&B, Covid) Nasopharyngeal Swab     Status: Abnormal   Collection Time: 11/11/20 12:16 PM   Specimen: Nasopharyngeal Swab; Nasopharyngeal(NP) swabs in vial transport medium  Result Value Ref Range Status   SARS Coronavirus 2 by RT PCR POSITIVE (A) NEGATIVE Final    Comment: RESULT CALLED TO, READ BACK BY AND VERIFIED WITH: HENRY AT 1322 ON 11/11/2020 BY JPM (NOTE) SARS-CoV-2 target nucleic acids are DETECTED.  The SARS-CoV-2 RNA is generally detectable in upper respiratory specimens during the acute phase of infection. Positive results are indicative of the presence of the identified virus, but do not rule out bacterial infection or co-infection with other pathogens not detected by the test. Clinical correlation with patient history and other diagnostic information is necessary to determine patient infection status. The expected result is Negative.  Fact Sheet for Patients: 01/11/21  Fact Sheet for Healthcare Providers: 01/11/2021  This test is not yet approved or cleared by the [employer] FDA and  has been authorized for detection and/or diagnosis of SARS-CoV-2 by FDA under an  Emergency Use Authorization (EUA).  This EUA will remain in effect (meaning this test can be  used) for the duration of  the COVID-19 declaration under Section 564(b)(1) of the Act, 21 U.S.C. section 360bbb-3(b)(1), unless the authorization is terminated or revoked sooner.     Influenza A by PCR NEGATIVE NEGATIVE Final   Influenza B by PCR NEGATIVE NEGATIVE Final    Comment: (NOTE) The Xpert Xpress SARS-CoV-2/FLU/RSV plus assay is intended as an aid in the  diagnosis of influenza from Nasopharyngeal swab specimens and should not be used as a sole basis for treatment. Nasal washings and aspirates are unacceptable for Xpert Xpress SARS-CoV-2/FLU/RSV testing.  Fact Sheet for Patients: [employer]  Fact Sheet for Healthcare Providers: [employer]ious[employer]ker.it  This test is not yet approved or cleared by the [place] FDA and has been authorized for detection and/or diagnosis of SARS-CoV-2 by FDA under an Emergency Use Authorization (EUA). This EUA will remain in effect (meaning this test can be used) for the duration of the COVID-19 declaration under Section 564(b)(1) of the Act, 21 U.S.C. section 360bbb-3(b)(1), unless the authorization is terminated or revoked.  Performed at [facility], [address], [place], [place] [address]   MRSA PCR Screening     Status: None   Collection Time: 11/11/20  4:55 PM   Specimen: Nasopharyngeal  Result Value Ref Range Status   MRSA by PCR NEGATIVE NEGATIVE Final    Comment:        The GeneXpert MRSA Assay (FDA approved for NASAL specimens only), is one component of a comprehensive MRSA colonization surveillance program. It is not intended to diagnose MRSA infection nor to guide or monitor treatment for MRSA infections. Performed at [facility], [address] [place], [place] [address]          Radiology Studies: VAS [place] LOWER EXTREMITY  VENOUS (DVT)  Result Date: 11/14/2020  Lower Venous DVT Study Indications: Covid+.  Comparison Study: Previous exam 09/06/20 - negative Performing Technologist: Ernestene Mention  Examination Guidelines: A complete evaluation includes B-mode imaging, spectral Doppler, color Doppler, and power Doppler as needed of all accessible portions of each vessel. Bilateral testing is considered an integral part of a complete examination. Limited examinations for reoccurring indications may be performed as noted. The reflux portion of the exam is performed with the patient in reverse Trendelenburg.  +---------+---------------+---------+-----------+----------+--------------+ RIGHT    CompressibilityPhasicitySpontaneityPropertiesThrombus Aging +---------+---------------+---------+-----------+----------+--------------+ CFV      Full           Yes      Yes                                 +---------+---------------+---------+-----------+----------+--------------+ SFJ      Full                                                        +---------+---------------+---------+-----------+----------+--------------+ FV Prox  Full           Yes      Yes                                 +---------+---------------+---------+-----------+----------+--------------+ FV Mid   Full           Yes      Yes                                 +---------+---------------+---------+-----------+----------+--------------+ FV DistalFull           Yes      Yes                                 +---------+---------------+---------+-----------+----------+--------------+  PFV      Full                                                        +---------+---------------+---------+-----------+----------+--------------+ POP      Full           Yes      Yes                                 +---------+---------------+---------+-----------+----------+--------------+ PTV      Full                                                         +---------+---------------+---------+-----------+----------+--------------+ PERO     Full                                                        +---------+---------------+---------+-----------+----------+--------------+   +---------+---------------+---------+-----------+----------+--------------+ LEFT     CompressibilityPhasicitySpontaneityPropertiesThrombus Aging +---------+---------------+---------+-----------+----------+--------------+ CFV      Full           Yes      Yes                                 +---------+---------------+---------+-----------+----------+--------------+ SFJ      Full                                                        +---------+---------------+---------+-----------+----------+--------------+ FV Prox  Full           Yes      Yes                                 +---------+---------------+---------+-----------+----------+--------------+ FV Mid   Full           Yes      Yes                                 +---------+---------------+---------+-----------+----------+--------------+ FV DistalFull           Yes      Yes                                 +---------+---------------+---------+-----------+----------+--------------+ PFV      Full                                                        +---------+---------------+---------+-----------+----------+--------------+  POP      Full           Yes      Yes                                 +---------+---------------+---------+-----------+----------+--------------+ PTV      Full                                                        +---------+---------------+---------+-----------+----------+--------------+ PERO     Full                                                        +---------+---------------+---------+-----------+----------+--------------+     Summary: BILATERAL: - No evidence of deep vein thrombosis seen in the lower extremities, bilaterally. - No  evidence of superficial venous thrombosis in the lower extremities, bilaterally. -No evidence of popliteal cyst, bilaterally.   *See table(s) above for measurements and observations. Electronically signed by Waverly Ferrari MD on 11/14/2020 at 5:00:29 PM.    Final         Scheduled Meds: . vitamin C  500 mg Oral Daily  . Chlorhexidine Gluconate Cloth  6 each Topical Daily  . DULoxetine  30 mg Oral Daily  . enoxaparin (LOVENOX) injection  40 mg Subcutaneous Q24H  . gabapentin  400 mg Oral TID  . Ipratropium-Albuterol  1 puff Inhalation Q6H  . mouth rinse  15 mL Mouth Rinse BID  .  morphine injection  4 mg Intravenous Q3H  . nicotine  14 mg Transdermal Daily  . predniSONE  50 mg Oral Daily  . sodium chloride flush  10-40 mL Intracatheter Q12H  . zinc sulfate  220 mg Oral Daily   Continuous Infusions:    LOS: 4 days       Kathlen Mody, MD [facility]   To contact the attending provider between 7A-7P or the covering provider during after hours 7P-7A, please log into the web site [email or web address] and access using universal [place] password for that web site. If you do not have the password, please call the hospital operator.  11/15/2020, 3:55 PM

## 2020-11-15 NOTE — Progress Notes (Signed)
Pt alert, c/o pain and tingling sensation on her all toes on both feet, notified MD on call and ordered 1x dose of tramadol 50 mg, administered to pt with good relief and will continue plan of care.

## 2020-11-16 DIAGNOSIS — R0603 Acute respiratory distress: Secondary | ICD-10-CM | POA: Diagnosis not present

## 2020-11-16 DIAGNOSIS — J9602 Acute respiratory failure with hypercapnia: Secondary | ICD-10-CM | POA: Diagnosis not present

## 2020-11-16 DIAGNOSIS — J9601 Acute respiratory failure with hypoxia: Secondary | ICD-10-CM | POA: Diagnosis not present

## 2020-11-16 LAB — CBC WITH DIFFERENTIAL/PLATELET
Abs Immature Granulocytes: 0.94 10*3/uL — ABNORMAL HIGH (ref 0.00–0.07)
Basophils Absolute: 0 10*3/uL (ref 0.0–0.1)
Basophils Relative: 0 %
Eosinophils Absolute: 0.1 10*3/uL (ref 0.0–0.5)
Eosinophils Relative: 1 %
HCT: 40.8 % (ref 36.0–46.0)
Hemoglobin: 12 g/dL (ref 12.0–15.0)
Immature Granulocytes: 7 %
Lymphocytes Relative: 21 %
Lymphs Abs: 3 10*3/uL (ref 0.7–4.0)
MCH: 26.1 pg (ref 26.0–34.0)
MCHC: 29.4 g/dL — ABNORMAL LOW (ref 30.0–36.0)
MCV: 88.9 fL (ref 80.0–100.0)
Monocytes Absolute: 1.1 10*3/uL — ABNORMAL HIGH (ref 0.1–1.0)
Monocytes Relative: 8 %
Neutro Abs: 9 10*3/uL — ABNORMAL HIGH (ref 1.7–7.7)
Neutrophils Relative %: 63 %
Platelets: 234 10*3/uL (ref 150–400)
RBC: 4.59 MIL/uL (ref 3.87–5.11)
RDW: 14.6 % (ref 11.5–15.5)
WBC: 14.2 10*3/uL — ABNORMAL HIGH (ref 4.0–10.5)
nRBC: 0 % (ref 0.0–0.2)

## 2020-11-16 LAB — COMPREHENSIVE METABOLIC PANEL
ALT: 21 U/L (ref 0–44)
AST: 20 U/L (ref 15–41)
Albumin: 3.2 g/dL — ABNORMAL LOW (ref 3.5–5.0)
Alkaline Phosphatase: 45 U/L (ref 38–126)
Anion gap: 7 (ref 5–15)
BUN: 16 mg/dL (ref 6–20)
CO2: 41 mmol/L — ABNORMAL HIGH (ref 22–32)
Calcium: 8.4 mg/dL — ABNORMAL LOW (ref 8.9–10.3)
Chloride: 93 mmol/L — ABNORMAL LOW (ref 98–111)
Creatinine, Ser: 0.59 mg/dL (ref 0.44–1.00)
GFR, Estimated: >60 mL/min (ref >60)
Glucose, Bld: 73 mg/dL (ref 70–99)
Potassium: 3.8 mmol/L (ref 3.5–5.1)
Sodium: 141 mmol/L (ref 135–145)
Total Bilirubin: 0.8 mg/dL (ref 0.3–1.2)
Total Protein: 5.7 g/dL — ABNORMAL LOW (ref 6.5–8.1)

## 2020-11-16 LAB — ALPHA-1 ANTITRYPSIN PHENOTYPE: A-1 Antitrypsin, Ser: 131 mg/dL (ref 101–187)

## 2020-11-16 LAB — LEGIONELLA PNEUMOPHILA SEROGP 1 UR AG: L. pneumophila Serogp 1 Ur Ag: NEGATIVE

## 2020-11-16 LAB — D-DIMER, QUANTITATIVE: D-Dimer, Quant: 1.16 ug/mL-FEU — ABNORMAL HIGH (ref 0.00–0.50)

## 2020-11-16 LAB — FERRITIN: Ferritin: 89 ng/mL (ref 11–307)

## 2020-11-16 LAB — PHOSPHORUS: Phosphorus: 2.8 mg/dL (ref 2.5–4.6)

## 2020-11-16 LAB — C-REACTIVE PROTEIN: CRP: 0.8 mg/dL (ref <1.0)

## 2020-11-16 LAB — MAGNESIUM: Magnesium: 2.5 mg/dL — ABNORMAL HIGH (ref 1.7–2.4)

## 2020-11-16 MED ORDER — ALPRAZOLAM 0.25 MG PO TABS
0.2500 mg | ORAL_TABLET | Freq: Every evening | ORAL | Status: DC | PRN
  Administered 2020-11-16: 0.25 mg via ORAL
  Filled 2020-11-16: qty 1

## 2020-11-16 MED ORDER — LIP MEDEX EX OINT
TOPICAL_OINTMENT | CUTANEOUS | Status: DC | PRN
  Filled 2020-11-16: qty 7

## 2020-11-16 MED ORDER — MORPHINE SULFATE (PF) 2 MG/ML IV SOLN
1.0000 mg | INTRAVENOUS | Status: DC | PRN
  Administered 2020-11-16 – 2020-11-20 (×15): 2 mg via INTRAVENOUS
  Filled 2020-11-16 (×15): qty 1

## 2020-11-16 MED ORDER — PREDNISONE 20 MG PO TABS
40.0000 mg | ORAL_TABLET | Freq: Every day | ORAL | Status: DC
  Administered 2020-11-17 – 2020-11-20 (×4): 40 mg via ORAL
  Filled 2020-11-16 (×4): qty 2

## 2020-11-16 NOTE — Plan of Care (Signed)
Pt aox4, tolerated Bipap thru the night with O2 sat of 98-100%.  No signs of respiratory distress noted and will continue plan of care.   Problem: Health Behavior/Discharge Planning: Goal: Ability to manage health-related needs will improve Outcome: Progressing   Problem: Clinical Measurements: Goal: Ability to maintain clinical measurements within normal limits will improve Outcome: Progressing   Problem: Clinical Measurements: Goal: Respiratory complications will improve Outcome: Progressing   Problem: Activity: Goal: Risk for activity intolerance will decrease Outcome: Progressing   Problem: Coping: Goal: Level of anxiety will decrease Outcome: Progressing   Problem: Pain Managment: Goal: General experience of comfort will improve Outcome: Progressing   Problem: Skin Integrity: Goal: Risk for impaired skin integrity will decrease Outcome: Progressing

## 2020-11-16 NOTE — Progress Notes (Signed)
Pt on BIPAP machine per hs order for tonight. Tolerating well

## 2020-11-16 NOTE — Progress Notes (Signed)
Shift Summary :   Remained alert and oriented, on 6L HFNC during this shift. Requested anxiety medication. Encouraged to get OOB to use bathroom, patient refuses. Worked with therapy. Attempted to wean down below 6L patient refused weaning below to 6 due to feeling cramped for air.   POC: d.c home when weaned down to 4L.

## 2020-11-16 NOTE — Progress Notes (Signed)
In to see patient, alert and oriented, RR even and unlabored, transitioned from bi-pap, back to hi-flo nasal cannula. Patient was 98% on 7L, titrated down to 6L.O2 saturation remained 98%. Ordered breakfast, provided with meal tray, bed low and locked. No other needs identified. Will continue to monitor.

## 2020-11-16 NOTE — Evaluation (Signed)
Physical Therapy Evaluation Patient Details Name: [name] MRN: [id number] DOB: 1965/10/08 Today's Date: 11/16/2020   History of Present Illness  55 year old lady with h/o COPD, chronic diastolic heart failure, chronic hypoxic respiratory failure on 4 lit of [place] oxygen, OSA, presents to ED with sob, non compliant to BIPAP, was found to have pneumonia.  Pt admitted for acute on chronic hypoxic respiratory failure sec to COVID 19 /copd exacerbation  Clinical Impression  Pt admitted with above diagnosis.  Pt currently with functional limitations due to the deficits listed below (see PT Problem List). Pt will benefit from skilled PT to increase their independence and safety with mobility to allow discharge to the venue listed below.  Pt reports not being OOB since admission.  Pt appears anxious of mobility and becoming SOB.  Pt assisted to recliner and reports moderate dyspnea.  SpO2 dropped to 82% on 6L HFNC after transfer and improved quickly to 93% with increase to 8L.  Pt left on 7L due to feeling SOB however SPO2 91-95% end of session.  RN notified of oxygen saturations and pt's request for morphine.  Pt encouraged to start being OOB in recliner and at least start using BSC in preparation for d/c home soon (has had pure wick).      Follow Up Recommendations Home health PT    Equipment Recommendations  None recommended by PT    Recommendations for Other Services       Precautions / Restrictions Precautions Precautions: Fall Precaution Comments: chronic 4L O2 [place] at home      Mobility  Bed Mobility Overal bed mobility: Needs Assistance Bed Mobility: Supine to Sit     Supine to sit: Supervision;HOB elevated     General bed mobility comments: pt requested elevated HOB and utilized hand rail to self assist (sleeps in recliner)    Transfers Overall transfer level: Needs assistance Equipment used: None Transfers: Sit to/from [employer] Sit to Stand: Min  guard Stand pivot transfers: Min guard       General transfer comment: min/guard for safety, pt used UE support through armrests  Ambulation/Gait             General Gait Details: pt declined, felt too SOB after only transferring to recliner (has not been OOB since admission per pt)  Stairs            Wheelchair Mobility    Modified Rankin (Stroke Patients Only)       Balance Overall balance assessment: Mild deficits observed, not formally tested                                           Pertinent Vitals/Pain Pain Assessment: No/denies pain    Home Living Family/patient expects to be discharged to:: Private residence Living Arrangements: Spouse/significant other Available Help at Discharge: Family;Available 24 hours/day Type of Home: House Home Access: Stairs to enter Entrance Stairs-Rails: Can reach both;Left;Right Entrance Stairs-Number of Steps: 1 Home Layout: Two level;Able to live on main level with bedroom/bathroom Home Equipment: Dan Humphreys - 2 wheels;Shower seat;Adaptive equipment;Hand held shower head;Walker - 4 wheels;Bedside commode;Wheelchair - manual Additional Comments: home O2 at 4L    Prior Function Level of Independence: Needs assistance   Gait / Transfers Assistance Needed: Pt reports she sometimes uses RW but mainly will hold onto walls in home, max 50' at a time  ADL's /  Homemaking Assistance Needed: Assistance for showers and LB dressing tasks as needed, sleeps in a recliner        Hand Dominance        Extremity/Trunk Assessment        Lower Extremity Assessment Lower Extremity Assessment: Generalized weakness    Cervical / Trunk Assessment Cervical / Trunk Assessment: Normal  Communication   Communication: No difficulties  Cognition Arousal/Alertness: Awake/alert Behavior During Therapy: WFL for tasks assessed/performed;Anxious Overall Cognitive Status: Within Functional Limits for tasks assessed                                         General Comments      Exercises     Assessment/Plan    PT Assessment Patient needs continued PT services  PT Problem List Decreased strength;Decreased mobility;Decreased activity tolerance;Cardiopulmonary status limiting activity       PT Treatment Interventions Gait training;DME instruction;Therapeutic exercise;Balance training;Functional mobility training;Therapeutic activities;Patient/family education    PT Goals (Current goals can be found in the Care Plan section)  Acute Rehab PT Goals Patient Stated Goal: home asap PT Goal Formulation: With patient Time For Goal Achievement: 11/30/20 Potential to Achieve Goals: Good    Frequency Min 3X/week   Barriers to discharge        Co-evaluation               AM-PAC PT "6 Clicks" Mobility  Outcome Measure Help needed turning from your back to your side while in a flat bed without using bedrails?: None Help needed moving from lying on your back to sitting on the side of a flat bed without using bedrails?: None Help needed moving to and from a bed to a chair (including a wheelchair)?: A Little Help needed standing up from a chair using your arms (e.g., wheelchair or bedside chair)?: A Little Help needed to walk in hospital room?: A Little Help needed climbing 3-5 steps with a railing? : A Lot 6 Click Score: 19    End of Session Equipment Utilized During Treatment: Oxygen Activity Tolerance: Patient tolerated treatment well Patient left: in chair;with call bell/phone within reach;with family/visitor present (agreeable to use call bell for out of recliner, spouse present) Nurse Communication: Mobility status PT Visit Diagnosis: Other abnormalities of gait and mobility (R26.89)    Time: 4709-6283 PT Time Calculation (min) (ACUTE ONLY): 25 min   Charges:   PT Evaluation $PT Eval Low Complexity: 1 Low         Kati PT, DPT Acute Rehabilitation Services Pager:  [phone] Office: [phone] Maida Sale E 11/16/2020, 12:41 PM

## 2020-11-16 NOTE — Progress Notes (Signed)
Assisted back to bed. Patient was placed back on 7L of hi-flo nasal cannula during therapy, as patient got short winded. During transfer back to bed, patient got extremely short winded, and  Begin to purse lip breath. Hob sat all the way up to allow expansion of airway, encouraged to take deep breaths. O2 sats during this time remained in the upper 90s. [place]wick repositioned between legs, husband remains at bedside. No additional needs, identified.. Will continue to monitor.

## 2020-11-16 NOTE — Progress Notes (Signed)
PROGRESS NOTE    Nichole RanchDrinda S Joos  ZOX:[id number] DOB: 03-04-1966 DOA: 11/11/2020 PCP: [name], Michael C, MD   Chief Complaint  Patient presents with  . Respiratory Distress  . Pneumonia    Brief Narrative:   55 YEAR old lady with h/o COPD, chronic diastolic heart failure, chronic hypoxic respiratory failure on 4 lit of [place] oxygen, OSA, presents to ED with sob, non compliant to BIPAP, was found to have pneumonia.  She was started on IV steroids, placed on bipap and admitted for further evaluation. She received Actemra, CT chest does not show any acute pulm embolism. She has completed remdesivir . Plan to continue with prednisone as per pccm recommendations.  Patient seen and examined at bedside with family.  Patient was able to wean oxygen to 6 L/min Reports she had a good night sleep with trazodone and would like to continue with.   Assessment & Plan:   Active Problems:   COVID-19   Respiratory distress   DNR (do not resuscitate)   Acute respiratory failure (HCC)   Acute on chronic hypoxic respiratory failure sec to COVID 19 /copd exacerbation: - completed actemra and remdesivir.  Continue with prednisone taper.   And taper oxygen as tolerated. Currently on 6 lit/min.  No chest pain or shortness of breath at this time. Streptococcal antigen is negative.  Legionella antigen is negative. CRP is improving.  Alpha 1 anti trypsin is pending.   She need BIPAP for severe copd.  ABG shows ph of 7.4 with PCO2 OF 66 AND PO2 OF 71. Pt continues to exhibit signs of hypercapnia associated with chronic respiratory failure secondary to severe COPD. Interruption or failure to provide NIV would quickly lead to exacerbation of the patient's condition, hospital admission and likely harm to the patient. Continued use is preferred. The use of the NIV will treat patient's high p co2 levels and can reduce the risk of exacerbations and future hospitalizations when used at night and during the day.  BILEVEL/ rad has been tried previously and has been proven ineffective at managing this patient's hypercapnia. Ventilation is required to decreased the work of breathing and improve pulmonary status. Interruption of ventilator support would lead to decline of health status . Patient is able to protect her airway and clear secretions on their own.    Chronic diastolic heart failure:  - she appears compensated.  Continue strict intake and output. Cardiogram showed left ventricular ejection fraction of 65 to 70%, no regional wall abnormalities, with indeterminate left ventricular diastolic parameters.   Hypertension:  Blood pressure parameters appear to be optimal   Leukocytosis  Probably from steroids.     DVT prophylaxis: (Lovenox) Code Status: DNR Family Communication: none at bedside.  Disposition:   Status is: Inpatient  Remains inpatient appropriate because:Unsafe d/c plan, IV treatments appropriate due to intensity of illness or inability to take PO and Inpatient level of care appropriate due to severity of illness   Dispo: The patient is from: Home              Anticipated d/c is to: Home              Patient currently is not medically stable to d/c.   Difficult to place patient No       Consultants:   PCCM    Procedures: None.    Antimicrobials: rocephin and zithromax    Subjective: Pt reports feeling better, no chest pain . No nausea or vomiting.  Objective: Vitals:  11/16/20 0100 11/16/20 0200 11/16/20 0300 11/16/20 0552  BP:    (!) 146/96  Pulse:    85  Resp:    18  Temp:    97.8 F (36.6 C)  TempSrc:    Oral  SpO2: 97% 99% 98% 100%  Weight:      Height:        Intake/Output Summary (Last 24 hours) at 11/16/2020 1018 Last data filed at 11/15/2020 2033 Gross per 24 hour  Intake -  Output 1200 ml  Net -1200 ml   Filed Weights   11/11/20 1640 11/11/20 1727 11/14/20 2052  Weight: 84.6 kg 86 kg 88.4 kg    Examination:  General exam: Alert  and comfortable, not in any kind of distress Respiratory system: Air entry fair bilateral, on 6 L of nasal cannula oxygen, tachypnea on ambulation. Cardiovascular system: S1-S2 heard, regular rate rhythm, no JVD no pedal edema Gastrointestinal system: Abdomen is soft nontender, nondistended, bowel sounds normal Central nervous system: Alert and oriented, grossly nonfocal Extremities: No pedal edema, Skin: No rashes seen Psychiatry: Mood is appropriate    Data Reviewed: I have personally reviewed following labs and imaging studies  CBC: Recent Labs  Lab 11/12/20 0243 11/12/20 0844 11/13/20 0343 11/14/20 0527 11/15/20 0601 11/16/20 0608  WBC 11.4*  --  10.5 10.7* 14.2* 14.2*  NEUTROABS 10.6*  --  9.4* 9.5* 10.0* 9.0*  HGB 12.0 12.2 11.9* 12.1 11.7* 12.0  HCT 40.5 36.0 41.5 41.8 39.5 40.8  MCV 89.6  --  91.2 91.1 87.8 88.9  PLT 231  --  239 246 234 234    Basic Metabolic Panel: Recent Labs  Lab 11/12/20 0243 11/12/20 0844 11/13/20 0343 11/14/20 0527 11/15/20 0601 11/16/20 0608  NA 141 139 143 144 138 141  K 4.6 4.5 4.4 4.5 3.9 3.8  CL 94*  --  94* 94* 91* 93*  CO2 35*  --  41* 38* 39* 41*  GLUCOSE 150*  --  160* 104* 69* 73  BUN 16  --  27* 36* 22* 16  CREATININE 0.64  --  0.68 0.62 0.53 0.59  CALCIUM 8.9  --  8.9 9.1 8.6* 8.4*  MG 2.3  --  2.5* 2.6* 2.5* 2.5*  PHOS 5.0*  --  4.9* 5.2* 2.6 2.8    GFR: Estimated Creatinine Clearance: 83.8 mL/min (by C-G formula based on SCr of 0.59 mg/dL).  Liver Function Tests: Recent Labs  Lab 11/12/20 0243 11/13/20 0343 11/14/20 0527 11/15/20 0601 11/16/20 0608  AST 15 16 22 21 20   ALT 18 19 22 21 21   ALKPHOS 52 49 49 46 45  BILITOT 0.6 0.4 0.7 0.8 0.8  PROT 6.4* 6.4* 6.3* 5.9* 5.7*  ALBUMIN 3.3* 3.2* 3.3* 3.2* 3.2*    CBG: Recent Labs  Lab 11/12/20 0332 11/12/20 0823 11/12/20 1225 11/12/20 1703 11/13/20 0336  GLUCAP 147* 154* 146* 182* 170*     Recent Results (from the past 240 hour(s))  Resp Panel by  RT-PCR (Flu A&B, Covid) Nasopharyngeal Swab     Status: Abnormal   Collection Time: 11/11/20 12:16 PM   Specimen: Nasopharyngeal Swab; Nasopharyngeal(NP) swabs in vial transport medium  Result Value Ref Range Status   SARS Coronavirus 2 by RT PCR POSITIVE (A) NEGATIVE Final    Comment: RESULT CALLED TO, READ BACK BY AND VERIFIED WITH: HENRY AT 1322 ON 11/11/2020 BY JPM (NOTE) SARS-CoV-2 target nucleic acids are DETECTED.  The SARS-CoV-2 RNA is generally detectable in upper respiratory specimens during the  acute phase of infection. Positive results are indicative of the presence of the identified virus, but do not rule out bacterial infection or co-infection with other pathogens not detected by the test. Clinical correlation with patient history and other diagnostic information is necessary to determine patient infection status. The expected result is Negative.  Fact Sheet for Patients: BloggerCourse.com  Fact Sheet for Healthcare Providers: SeriousBroker.it  This test is not yet approved or cleared by the [place] FDA and  has been authorized for detection and/or diagnosis of SARS-CoV-2 by FDA under an Emergency Use Authorization (EUA).  This EUA will remain in effect (meaning this test can be  used) for the duration of  the COVID-19 declaration under Section 564(b)(1) of the Act, 21 U.S.C. section 360bbb-3(b)(1), unless the authorization is terminated or revoked sooner.     Influenza A by PCR NEGATIVE NEGATIVE Final   Influenza B by PCR NEGATIVE NEGATIVE Final    Comment: (NOTE) The Xpert Xpress SARS-CoV-2/FLU/RSV plus assay is intended as an aid in the diagnosis of influenza from Nasopharyngeal swab specimens and should not be used as a sole basis for treatment. Nasal washings and aspirates are unacceptable for Xpert Xpress SARS-CoV-2/FLU/RSV testing.  Fact Sheet for  Patients: BloggerCourse.com  Fact Sheet for Healthcare Providers: SeriousBroker.it  This test is not yet approved or cleared by the [place] FDA and has been authorized for detection and/or diagnosis of SARS-CoV-2 by FDA under an Emergency Use Authorization (EUA). This EUA will remain in effect (meaning this test can be used) for the duration of the COVID-19 declaration under Section 564(b)(1) of the Act, 21 U.S.C. section 360bbb-3(b)(1), unless the authorization is terminated or revoked.  Performed at [facility], [address], [place], [place] [address]   MRSA PCR Screening     Status: None   Collection Time: 11/11/20  4:55 PM   Specimen: Nasopharyngeal  Result Value Ref Range Status   MRSA by PCR NEGATIVE NEGATIVE Final    Comment:        The GeneXpert MRSA Assay (FDA approved for NASAL specimens only), is one component of a comprehensive MRSA colonization surveillance program. It is not intended to diagnose MRSA infection nor to guide or monitor treatment for MRSA infections. Performed at [facility], [address] [place], [place] [address]          Radiology Studies: VAS [place] LOWER EXTREMITY VENOUS (DVT)  Result Date: 11/14/2020  Lower Venous DVT Study Indications: Covid+.  Comparison Study: Previous exam 09/06/20 - negative Performing Technologist: Ernestene Mention  Examination Guidelines: A complete evaluation includes B-mode imaging, spectral Doppler, color Doppler, and power Doppler as needed of all accessible portions of each vessel. Bilateral testing is considered an integral part of a complete examination. Limited examinations for reoccurring indications may be performed as noted. The reflux portion of the exam is performed with the patient in reverse Trendelenburg.  +---------+---------------+---------+-----------+----------+--------------+ RIGHT     CompressibilityPhasicitySpontaneityPropertiesThrombus Aging +---------+---------------+---------+-----------+----------+--------------+ CFV      Full           Yes      Yes                                 +---------+---------------+---------+-----------+----------+--------------+ SFJ      Full                                                        +---------+---------------+---------+-----------+----------+--------------+  FV Prox  Full           Yes      Yes                                 +---------+---------------+---------+-----------+----------+--------------+ FV Mid   Full           Yes      Yes                                 +---------+---------------+---------+-----------+----------+--------------+ FV DistalFull           Yes      Yes                                 +---------+---------------+---------+-----------+----------+--------------+ PFV      Full                                                        +---------+---------------+---------+-----------+----------+--------------+ POP      Full           Yes      Yes                                 +---------+---------------+---------+-----------+----------+--------------+ PTV      Full                                                        +---------+---------------+---------+-----------+----------+--------------+ PERO     Full                                                        +---------+---------------+---------+-----------+----------+--------------+   +---------+---------------+---------+-----------+----------+--------------+ LEFT     CompressibilityPhasicitySpontaneityPropertiesThrombus Aging +---------+---------------+---------+-----------+----------+--------------+ CFV      Full           Yes      Yes                                 +---------+---------------+---------+-----------+----------+--------------+ SFJ      Full                                                         +---------+---------------+---------+-----------+----------+--------------+ FV Prox  Full           Yes      Yes                                 +---------+---------------+---------+-----------+----------+--------------+ FV Mid   Full  Yes      Yes                                 +---------+---------------+---------+-----------+----------+--------------+ FV DistalFull           Yes      Yes                                 +---------+---------------+---------+-----------+----------+--------------+ PFV      Full                                                        +---------+---------------+---------+-----------+----------+--------------+ POP      Full           Yes      Yes                                 +---------+---------------+---------+-----------+----------+--------------+ PTV      Full                                                        +---------+---------------+---------+-----------+----------+--------------+ PERO     Full                                                        +---------+---------------+---------+-----------+----------+--------------+     Summary: BILATERAL: - No evidence of deep vein thrombosis seen in the lower extremities, bilaterally. - No evidence of superficial venous thrombosis in the lower extremities, bilaterally. -No evidence of popliteal cyst, bilaterally.   *See table(s) above for measurements and observations. Electronically signed by Waverly Ferrari MD on 11/14/2020 at 5:00:29 PM.    Final         Scheduled Meds: . vitamin C  500 mg Oral Daily  . Chlorhexidine Gluconate Cloth  6 each Topical Daily  . DULoxetine  30 mg Oral Daily  . enoxaparin (LOVENOX) injection  40 mg Subcutaneous Q24H  . gabapentin  400 mg Oral TID  . Ipratropium-Albuterol  1 puff Inhalation Q6H  . mouth rinse  15 mL Mouth Rinse BID  .  morphine injection  4 mg Intravenous Q3H  . nicotine  14 mg Transdermal  Daily  . predniSONE  50 mg Oral Daily  . sodium chloride flush  10-40 mL Intracatheter Q12H  . zinc sulfate  220 mg Oral Daily   Continuous Infusions:    LOS: 5 days       Kathlen Mody, MD [facility]   To contact the attending provider between 7A-7P or the covering provider during after hours 7P-7A, please log into the web site [email or web address] and access using universal [place] password for that web site. If you do not have the password, please call the hospital operator.  11/16/2020, 10:18 AM

## 2020-11-16 NOTE — Care Management Important Message (Signed)
Important Message  Patient Details IM Letter given to the Patient. Name: [name] MRN: [id number] Date of Birth: 12-26-1965   Medicare Important Message Given:  Yes     Caren Macadam 11/16/2020, 10:51 AM

## 2020-11-17 DIAGNOSIS — J9602 Acute respiratory failure with hypercapnia: Secondary | ICD-10-CM | POA: Diagnosis not present

## 2020-11-17 DIAGNOSIS — J9601 Acute respiratory failure with hypoxia: Secondary | ICD-10-CM | POA: Diagnosis not present

## 2020-11-17 DIAGNOSIS — R0603 Acute respiratory distress: Secondary | ICD-10-CM | POA: Diagnosis not present

## 2020-11-17 MED ORDER — ALPRAZOLAM 0.25 MG PO TABS
0.2500 mg | ORAL_TABLET | Freq: Two times a day (BID) | ORAL | Status: DC | PRN
  Administered 2020-11-17 – 2020-11-20 (×6): 0.25 mg via ORAL
  Filled 2020-11-17 (×6): qty 1

## 2020-11-17 MED ORDER — LORATADINE 10 MG PO TABS
10.0000 mg | ORAL_TABLET | Freq: Every day | ORAL | Status: DC
  Administered 2020-11-17 – 2020-11-20 (×4): 10 mg via ORAL
  Filled 2020-11-17 (×4): qty 1

## 2020-11-17 NOTE — Progress Notes (Signed)
PROGRESS NOTE    Kendrick RanchDrinda S Hereford  WRU:[id number] DOB: [birth year] DOA: 11/11/2020 PCP: [name], Michael C, MD   Chief Complaint  Patient presents with  . Respiratory Distress  . Pneumonia    Brief Narrative:   55 YEAR old lady with h/o COPD, chronic diastolic heart failure, chronic hypoxic respiratory failure on 4 lit of [place] oxygen, OSA, presents to ED with sob, non compliant to BIPAP, was found to have pneumonia.  She was started on IV steroids, placed on bipap and admitted for further evaluation. She received Actemra, CT chest does not show any acute pulm embolism. She has completed remdesivir . Plan to continue with prednisone as per pccm recommendations.  Patient seen and examined, with family at bedside.  She continues to remain on 6 L of nasal cannula oxygen.  Unable to wean her down today.  Patient reports she did not have a good night sleep, did not receive her trazodone.   Assessment & Plan:   Active Problems:   COVID-19   Respiratory distress   DNR (do not resuscitate)   Acute respiratory failure (HCC)   Acute on chronic hypoxic respiratory failure sec to COVID 19 /copd exacerbation: - completed actemra and remdesivir.  Continue with prednisone taper.   And taper oxygen as tolerated. Currently on 6 lit/min.  She denies any chest pain or shortness of breath at this time. Streptococcal antigen is negative.  Legionella antigen is negative. CRP has normalized.  She need BIPAP for severe copd.  ABG shows ph of 7.4 with PCO2 OF 66 AND PO2 OF 71. Pt continues to exhibit signs of hypercapnia associated with chronic respiratory failure secondary to severe COPD. Interruption or failure to provide NIV would quickly lead to exacerbation of the patient's condition, hospital admission and likely harm to the patient. Continued use is preferred. The use of the NIV will treat patient's high p co2 levels and can reduce the risk of exacerbations and future hospitalizations when used at night  and during the day. BILEVEL/ rad has been tried previously and has been proven ineffective at managing this patient's hypercapnia. Ventilation is required to decreased the work of breathing and improve pulmonary status. Interruption of ventilator support would lead to decline of health status . Patient is able to protect her airway and clear secretions on their own.    Chronic diastolic heart failure:  - she appears compensated.  Continue strict intake and output. Echocardiogram showed left ventricular ejection fraction of 65 to 70%, no regional wall abnormalities, with indeterminate left ventricular diastolic parameters.   Hypertension:  Blood pressure parameters appear to be optimal   Leukocytosis  Probably from steroids.  Stable around 14,000  Anxiety Increase the Xanax to twice daily as needed.     DVT prophylaxis: (Lovenox) Code Status: DNR Family Communication: Family at bedside disposition:   Status is: Inpatient  Remains inpatient appropriate because:Unsafe d/c plan, IV treatments appropriate due to intensity of illness or inability to take PO and Inpatient level of care appropriate due to severity of illness   Dispo: The patient is from: Home              Anticipated d/c is to: Home              Patient currently is not medically stable to d/c.   Difficult to place patient No       Consultants:   PCCM    Procedures: None.    Antimicrobials: rocephin and zithromax  Subjective: She reports she did not have a good night sleep as she missed her trazodone.  Requesting to increase the dose of Xanax. Objective: Vitals:   11/16/20 2140 11/16/20 2200 11/17/20 0554 11/17/20 1356  BP:   (!) 151/88 (!) 142/95  Pulse:   79 (!) 107  Resp: (!) 28 16 18 16   Temp:   98.4 F (36.9 C) 98.4 F (36.9 C)  TempSrc:   Oral Oral  SpO2:   100% 96%  Weight:      Height:        Intake/Output Summary (Last 24 hours) at 11/17/2020 1659 Last data filed at 11/17/2020  1659 Gross per 24 hour  Intake 360 ml  Output 2600 ml  Net -2240 ml   Filed Weights   11/11/20 1640 11/11/20 1727 11/14/20 2052  Weight: 84.6 kg 86 kg 88.4 kg    Examination:  General exam: Alert, on 6 L of nasal cannula oxygen, mild distress from tachypnea Respiratory system: Air entry fair bilateral, no wheezing heard, on 6 L of nasal cannula oxygen, tachypnea present Cardiovascular system: S1-S2 heard, regular rate rhythm, no JVD no pedal edema Gastrointestinal system: Abdomen is soft nontender bowel sounds normal Central nervous system: Alert and oriented, grossly nonfocal Extremities: No pedal edema Skin: No rashes seen Psychiatry: Anxious    Data Reviewed: I have personally reviewed following labs and imaging studies  CBC: Recent Labs  Lab 11/12/20 0243 11/12/20 0844 11/13/20 0343 11/14/20 0527 11/15/20 0601 11/16/20 0608  WBC 11.4*  --  10.5 10.7* 14.2* 14.2*  NEUTROABS 10.6*  --  9.4* 9.5* 10.0* 9.0*  HGB 12.0 12.2 11.9* 12.1 11.7* 12.0  HCT 40.5 36.0 41.5 41.8 39.5 40.8  MCV 89.6  --  91.2 91.1 87.8 88.9  PLT 231  --  239 246 234 234    Basic Metabolic Panel: Recent Labs  Lab 11/12/20 0243 11/12/20 0844 11/13/20 0343 11/14/20 0527 11/15/20 0601 11/16/20 0608  NA 141 139 143 144 138 141  K 4.6 4.5 4.4 4.5 3.9 3.8  CL 94*  --  94* 94* 91* 93*  CO2 35*  --  41* 38* 39* 41*  GLUCOSE 150*  --  160* 104* 69* 73  BUN 16  --  27* 36* 22* 16  CREATININE 0.64  --  0.68 0.62 0.53 0.59  CALCIUM 8.9  --  8.9 9.1 8.6* 8.4*  MG 2.3  --  2.5* 2.6* 2.5* 2.5*  PHOS 5.0*  --  4.9* 5.2* 2.6 2.8    GFR: Estimated Creatinine Clearance: 83.8 mL/min (by C-G formula based on SCr of 0.59 mg/dL).  Liver Function Tests: Recent Labs  Lab 11/12/20 0243 11/13/20 0343 11/14/20 0527 11/15/20 0601 11/16/20 0608  AST 15 16 22 21 20   ALT 18 19 22 21 21   ALKPHOS 52 49 49 46 45  BILITOT 0.6 0.4 0.7 0.8 0.8  PROT 6.4* 6.4* 6.3* 5.9* 5.7*  ALBUMIN 3.3* 3.2* 3.3* 3.2*  3.2*    CBG: Recent Labs  Lab 11/12/20 0332 11/12/20 0823 11/12/20 1225 11/12/20 1703 11/13/20 0336  GLUCAP 147* 154* 146* 182* 170*     Recent Results (from the past 240 hour(s))  Resp Panel by RT-PCR (Flu A&B, Covid) Nasopharyngeal Swab     Status: Abnormal   Collection Time: 11/11/20 12:16 PM   Specimen: Nasopharyngeal Swab; Nasopharyngeal(NP) swabs in vial transport medium  Result Value Ref Range Status   SARS Coronavirus 2 by RT PCR POSITIVE (A) NEGATIVE Final  Comment: RESULT CALLED TO, READ BACK BY AND VERIFIED WITH: HENRY AT 1322 ON 11/11/2020 BY JPM (NOTE) SARS-CoV-2 target nucleic acids are DETECTED.  The SARS-CoV-2 RNA is generally detectable in upper respiratory specimens during the acute phase of infection. Positive results are indicative of the presence of the identified virus, but do not rule out bacterial infection or co-infection with other pathogens not detected by the test. Clinical correlation with patient history and other diagnostic information is necessary to determine patient infection status. The expected result is Negative.  Fact Sheet for Patients: BloggerCourse.com  Fact Sheet for Healthcare Providers: SeriousBroker.it  This test is not yet approved or cleared by the [place] FDA and  has been authorized for detection and/or diagnosis of SARS-CoV-2 by FDA under an Emergency Use Authorization (EUA).  This EUA will remain in effect (meaning this test can be  used) for the duration of  the COVID-19 declaration under Section 564(b)(1) of the Act, 21 U.S.C. section 360bbb-3(b)(1), unless the authorization is terminated or revoked sooner.     Influenza A by PCR NEGATIVE NEGATIVE Final   Influenza B by PCR NEGATIVE NEGATIVE Final    Comment: (NOTE) The Xpert Xpress SARS-CoV-2/FLU/RSV plus assay is intended as an aid in the diagnosis of influenza from Nasopharyngeal swab specimens  and should not be used as a sole basis for treatment. Nasal washings and aspirates are unacceptable for Xpert Xpress SARS-CoV-2/FLU/RSV testing.  Fact Sheet for Patients: BloggerCourse.com  Fact Sheet for Healthcare Providers: SeriousBroker.it  This test is not yet approved or cleared by the [place] FDA and has been authorized for detection and/or diagnosis of SARS-CoV-2 by FDA under an Emergency Use Authorization (EUA). This EUA will remain in effect (meaning this test can be used) for the duration of the COVID-19 declaration under Section 564(b)(1) of the Act, 21 U.S.C. section 360bbb-3(b)(1), unless the authorization is terminated or revoked.  Performed at [facility], [address], [place], [place] [address]   MRSA PCR Screening     Status: None   Collection Time: 11/11/20  4:55 PM   Specimen: Nasopharyngeal  Result Value Ref Range Status   MRSA by PCR NEGATIVE NEGATIVE Final    Comment:        The GeneXpert MRSA Assay (FDA approved for NASAL specimens only), is one component of a comprehensive MRSA colonization surveillance program. It is not intended to diagnose MRSA infection nor to guide or monitor treatment for MRSA infections. Performed at [facility], [address], [place], [place] [address]          Radiology Studies: No results found.      Scheduled Meds: . vitamin C  500 mg Oral Daily  . Chlorhexidine Gluconate Cloth  6 each Topical Daily  . DULoxetine  30 mg Oral Daily  . enoxaparin (LOVENOX) injection  40 mg Subcutaneous Q24H  . gabapentin  400 mg Oral TID  . Ipratropium-Albuterol  1 puff Inhalation Q6H  . loratadine  10 mg Oral Daily  . mouth rinse  15 mL Mouth Rinse BID  . nicotine  14 mg Transdermal Daily  . predniSONE  40 mg Oral Daily  . sodium chloride flush  10-40 mL Intracatheter Q12H  . zinc sulfate  220 mg Oral Daily    Continuous Infusions:    LOS: 6 days       Kathlen Mody, MD [facility]   To contact the attending provider between 7A-7P or the covering provider during after  hours 7P-7A, please log into the web site [email or web address] and access using universal [place] password for that web site. If you do not have the password, please call the hospital operator.  11/17/2020, 4:59 PM

## 2020-11-18 DIAGNOSIS — J9601 Acute respiratory failure with hypoxia: Secondary | ICD-10-CM | POA: Diagnosis not present

## 2020-11-18 DIAGNOSIS — J9602 Acute respiratory failure with hypercapnia: Secondary | ICD-10-CM | POA: Diagnosis not present

## 2020-11-18 DIAGNOSIS — R0603 Acute respiratory distress: Secondary | ICD-10-CM | POA: Diagnosis not present

## 2020-11-18 LAB — CREATININE, SERUM
Creatinine, Ser: 0.56 mg/dL (ref 0.44–1.00)
GFR, Estimated: >60 mL/min (ref >60)

## 2020-11-18 MED ORDER — IPRATROPIUM-ALBUTEROL 20-100 MCG/ACT IN AERS
1.0000 | INHALATION_SPRAY | Freq: Two times a day (BID) | RESPIRATORY_TRACT | Status: DC
  Administered 2020-11-19 – 2020-11-20 (×3): 1 via RESPIRATORY_TRACT
  Filled 2020-11-18: qty 4

## 2020-11-18 NOTE — Progress Notes (Signed)
PROGRESS NOTE    [name]  GHW:[id number] DOB: 1966/04/14 DOA: 11/11/2020 PCP: [name], MD   Chief Complaint  Patient presents with  . Respiratory Distress  . Pneumonia    Brief Narrative:   55 YEAR old lady with h/o COPD, chronic diastolic heart failure, chronic hypoxic respiratory failure on 4 lit of [place] oxygen, OSA, presents to ED with sob, non compliant to BIPAP, was found to have pneumonia.  She was started on IV steroids, placed on bipap and admitted for further evaluation. She received Actemra, CT chest does not show any acute pulm embolism. She has completed remdesivir . Plan to continue with prednisone as per pccm recommendations.  Patient seen and examined at bedside wean her oxygen down to 4 L with sats around 93 to 94%, she reports back pain and neck pain requesting pain medication.   Assessment & Plan:   Active Problems:   COVID-19   Respiratory distress   DNR (do not resuscitate)   Acute respiratory failure (HCC)   Acute on chronic hypoxic respiratory failure sec to COVID 19 /copd exacerbation: - completed actemra and remdesivir.  Continue with prednisone taper.   And taper oxygen as tolerated.  Wean her oxygen down to 4 L/min she denies any chest pain or shortness of breath at this time. Streptococcal antigen is negative.  Legionella antigen is negative. CRP has normalized.  No changes in medications at this time.  Her BiPAP or trilogy will be delivered on Monday  She need BIPAP for severe copd.  ABG shows ph of 7.4 with PCO2 OF 66 AND PO2 OF 71. Pt continues to exhibit signs of hypercapnia associated with chronic respiratory failure secondary to severe COPD. Interruption or failure to provide NIV would quickly lead to exacerbation of the patient's condition, hospital admission and likely harm to the patient. Continued use is preferred. The use of the NIV will treat patient's high p co2 levels and can reduce the risk of exacerbations and future  hospitalizations when used at night and during the day. BILEVEL/ rad has been tried previously and has been proven ineffective at managing this patient's hypercapnia. Ventilation is required to decreased the work of breathing and improve pulmonary status. Interruption of ventilator support would lead to decline of health status . Patient is able to protect her airway and clear secretions on their own.    Chronic diastolic heart failure:  - she appears compensated.  Continue strict intake and output. Echocardiogram showed left ventricular ejection fraction of 65 to 70%, no regional wall abnormalities, with indeterminate left ventricular diastolic parameters.  No new medications   Hypertension:  Blood pressure parameters appear to be optimal   Leukocytosis  Probably from steroids.  Stable around 14,000  Anxiety Increase the Xanax to twice daily as needed.  Insomnia Continue with trazodone.   DVT prophylaxis: (Lovenox) Code Status: DNR Family Communication: Family at bedside disposition:   Status is: Inpatient  Remains inpatient appropriate because:Unsafe d/c plan, IV treatments appropriate due to intensity of illness or inability to take PO and Inpatient level of care appropriate due to severity of illness   Dispo: The patient is from: Home              Anticipated d/c is to: Home              Patient currently is not medically stable to d/c.   Difficult to place patient No       Consultants:   PCCM  Procedures: None.    Antimicrobials: rocephin and zithromax    Subjective: Patient reports some back pain and neck pain, we were able to wean her oxygen down to 4 L with sats in 93% Objective: Vitals:   11/18/20 0000 11/18/20 0500 11/18/20 0557 11/18/20 1410  BP:  (!) 153/93 (!) 153/93 (!) 145/95  Pulse: 92 94 92 (!) 107  Resp: 20 16 16 18   Temp:  98.4 F (36.9 C) 98.4 F (36.9 C) 98.9 F (37.2 C)  TempSrc:  Oral Oral Oral  SpO2: 98% 95% 96% 92%  Weight:       Height:        Intake/Output Summary (Last 24 hours) at 11/18/2020 1721 Last data filed at 11/18/2020 1253 Gross per 24 hour  Intake 477 ml  Output 1500 ml  Net -1023 ml   Filed Weights   11/11/20 1640 11/11/20 1727 11/14/20 2052  Weight: 84.6 kg 86 kg 88.4 kg    Examination:  General exam: Appears comfortable not in any kind of distress Respiratory system: Air entry fair bilateral, no wheezing heard on 4 L of nasal cannula oxygen no tachypnea Cardiovascular system: S1-S2 heard, regular rate rhythm, no JVD Gastrointestinal system: Abdomen is soft nontender nondistended bowel sounds normal Central nervous system: Answers all questions appropriately and follows command, grossly nonfocal Extremities: No pedal edema Skin: No rashes seen Psychiatry: Mood is appropriate    Data Reviewed: I have personally reviewed following labs and imaging studies  CBC: Recent Labs  Lab 11/12/20 0243 11/12/20 0844 11/13/20 0343 11/14/20 0527 11/15/20 0601 11/16/20 0608  WBC 11.4*  --  10.5 10.7* 14.2* 14.2*  NEUTROABS 10.6*  --  9.4* 9.5* 10.0* 9.0*  HGB 12.0 12.2 11.9* 12.1 11.7* 12.0  HCT 40.5 36.0 41.5 41.8 39.5 40.8  MCV 89.6  --  91.2 91.1 87.8 88.9  PLT 231  --  239 246 234 234    Basic Metabolic Panel: Recent Labs  Lab 11/12/20 0243 11/12/20 0844 11/13/20 0343 11/14/20 0527 11/15/20 0601 11/16/20 0608 11/18/20 0305  NA 141 139 143 144 138 141  --   K 4.6 4.5 4.4 4.5 3.9 3.8  --   CL 94*  --  94* 94* 91* 93*  --   CO2 35*  --  41* 38* 39* 41*  --   GLUCOSE 150*  --  160* 104* 69* 73  --   BUN 16  --  27* 36* 22* 16  --   CREATININE 0.64  --  0.68 0.62 0.53 0.59 0.56  CALCIUM 8.9  --  8.9 9.1 8.6* 8.4*  --   MG 2.3  --  2.5* 2.6* 2.5* 2.5*  --   PHOS 5.0*  --  4.9* 5.2* 2.6 2.8  --     GFR: Estimated Creatinine Clearance: 83.8 mL/min (by C-G formula based on SCr of 0.56 mg/dL).  Liver Function Tests: Recent Labs  Lab 11/12/20 0243 11/13/20 0343  11/14/20 0527 11/15/20 0601 11/16/20 0608  AST 15 16 22 21 20   ALT 18 19 22 21 21   ALKPHOS 52 49 49 46 45  BILITOT 0.6 0.4 0.7 0.8 0.8  PROT 6.4* 6.4* 6.3* 5.9* 5.7*  ALBUMIN 3.3* 3.2* 3.3* 3.2* 3.2*    CBG: Recent Labs  Lab 11/12/20 0332 11/12/20 0823 11/12/20 1225 11/12/20 1703 11/13/20 0336  GLUCAP 147* 154* 146* 182* 170*     Recent Results (from the past 240 hour(s))  Resp Panel by RT-PCR (Flu A&B, Covid) Nasopharyngeal Swab  Status: Abnormal   Collection Time: 11/11/20 12:16 PM   Specimen: Nasopharyngeal Swab; Nasopharyngeal(NP) swabs in vial transport medium  Result Value Ref Range Status   SARS Coronavirus 2 by RT PCR POSITIVE (A) NEGATIVE Final    Comment: RESULT CALLED TO, READ BACK BY AND VERIFIED WITH: HENRY AT 1322 ON 11/11/2020 BY JPM (NOTE) SARS-CoV-2 target nucleic acids are DETECTED.  The SARS-CoV-2 RNA is generally detectable in upper respiratory specimens during the acute phase of infection. Positive results are indicative of the presence of the identified virus, but do not rule out bacterial infection or co-infection with other pathogens not detected by the test. Clinical correlation with patient history and other diagnostic information is necessary to determine patient infection status. The expected result is Negative.  Fact Sheet for Patients: BloggerCourse.comhttps://www.fda.gov/media/152166/download  Fact Sheet for Healthcare Providers: SeriousBroker.ithttps://www.fda.gov/media/152162/download  This test is not yet approved or cleared by the [place] FDA and  has been authorized for detection and/or diagnosis of SARS-CoV-2 by FDA under an Emergency Use Authorization (EUA).  This EUA will remain in effect (meaning this test can be  used) for the duration of  the COVID-19 declaration under Section 564(b)(1) of the Act, 21 U.S.C. section 360bbb-3(b)(1), unless the authorization is terminated or revoked sooner.     Influenza A by PCR NEGATIVE NEGATIVE Final    Influenza B by PCR NEGATIVE NEGATIVE Final    Comment: (NOTE) The Xpert Xpress SARS-CoV-2/FLU/RSV plus assay is intended as an aid in the diagnosis of influenza from Nasopharyngeal swab specimens and should not be used as a sole basis for treatment. Nasal washings and aspirates are unacceptable for Xpert Xpress SARS-CoV-2/FLU/RSV testing.  Fact Sheet for Patients: BloggerCourse.comhttps://www.fda.gov/media/152166/download  Fact Sheet for Healthcare Providers: SeriousBroker.ithttps://www.fda.gov/media/152162/download  This test is not yet approved or cleared by the [place] FDA and has been authorized for detection and/or diagnosis of SARS-CoV-2 by FDA under an Emergency Use Authorization (EUA). This EUA will remain in effect (meaning this test can be used) for the duration of the COVID-19 declaration under Section 564(b)(1) of the Act, 21 U.S.C. section 360bbb-3(b)(1), unless the authorization is terminated or revoked.  Performed at [facility], [address] [place], [place] [address]   MRSA PCR Screening     Status: None   Collection Time: 11/11/20  4:55 PM   Specimen: Nasopharyngeal  Result Value Ref Range Status   MRSA by PCR NEGATIVE NEGATIVE Final    Comment:        The GeneXpert MRSA Assay (FDA approved for NASAL specimens only), is one component of a comprehensive MRSA colonization surveillance program. It is not intended to diagnose MRSA infection nor to guide or monitor treatment for MRSA infections. Performed at [facility], [address] [place], [place] [address]          Radiology Studies: No results found.      Scheduled Meds: . vitamin C  500 mg Oral Daily  . Chlorhexidine Gluconate Cloth  6 each Topical Daily  . DULoxetine  30 mg Oral Daily  . enoxaparin (LOVENOX) injection  40 mg Subcutaneous Q24H  . gabapentin  400 mg Oral TID  . Ipratropium-Albuterol  1 puff Inhalation Q6H  . loratadine  10 mg Oral Daily  . mouth rinse   15 mL Mouth Rinse BID  . nicotine  14 mg Transdermal Daily  . predniSONE  40 mg Oral Daily  . sodium chloride flush  10-40 mL Intracatheter Q12H  . zinc sulfate  220 mg Oral Daily   Continuous Infusions:    LOS: 7 days       Kathlen Mody, MD [facility]   To contact the attending provider between 7A-7P or the covering provider during after hours 7P-7A, please log into the web site [email or web address] and access using universal [place] password for that web site. If you do not have the password, please call the hospital operator.  11/18/2020, 5:21 PM

## 2020-11-18 NOTE — TOC Progression Note (Signed)
Transition of Care [facility]) - Progression Note    Patient Details  Name: [name] MRN: [id number] Date of Birth: 11/15/1965  Transition of [facility]) CM/SW Contact  Armanda Heritage, RN Phone Number: 11/18/2020, 12:43 PM  Clinical Narrative:    CM followed up with [employer] regarding Trilogy vent.  Per [employer] rep, insurance authorization initiated, but they do not anticipate having a response until Monday. MD updated.    Expected Discharge Plan: Home/Self Care Barriers to Discharge: Continued Medical Work up  Expected Discharge Plan and Services Expected Discharge Plan: Home/Self Care   Discharge Planning Services: CM Consult   Living arrangements for the past 2 months: Single Family Home                                       Social Determinants of Health (SDOH) Interventions    Readmission Risk Interventions No flowsheet data found.

## 2020-11-19 DIAGNOSIS — R0603 Acute respiratory distress: Secondary | ICD-10-CM | POA: Diagnosis not present

## 2020-11-19 DIAGNOSIS — J9602 Acute respiratory failure with hypercapnia: Secondary | ICD-10-CM | POA: Diagnosis not present

## 2020-11-19 DIAGNOSIS — J9601 Acute respiratory failure with hypoxia: Secondary | ICD-10-CM | POA: Diagnosis not present

## 2020-11-19 MED ORDER — DOXYCYCLINE HYCLATE 100 MG PO TABS
100.0000 mg | ORAL_TABLET | Freq: Two times a day (BID) | ORAL | Status: DC
  Administered 2020-11-19 – 2020-11-20 (×3): 100 mg via ORAL
  Filled 2020-11-19 (×4): qty 1

## 2020-11-19 MED ORDER — SENNOSIDES-DOCUSATE SODIUM 8.6-50 MG PO TABS
2.0000 | ORAL_TABLET | Freq: Two times a day (BID) | ORAL | Status: DC
  Administered 2020-11-19 – 2020-11-20 (×3): 2 via ORAL
  Filled 2020-11-19 (×3): qty 2

## 2020-11-19 MED ORDER — FLUTICASONE PROPIONATE 50 MCG/ACT NA SUSP
2.0000 | Freq: Every day | NASAL | Status: DC
  Administered 2020-11-19 – 2020-11-20 (×2): 2 via NASAL
  Filled 2020-11-19: qty 16

## 2020-11-19 MED ORDER — POLYETHYLENE GLYCOL 3350 17 G PO PACK
17.0000 g | PACK | Freq: Every day | ORAL | Status: DC
  Administered 2020-11-19: 17 g via ORAL
  Filled 2020-11-19 (×2): qty 1

## 2020-11-19 MED ORDER — ALUM & MAG HYDROXIDE-SIMETH 200-200-20 MG/5ML PO SUSP
30.0000 mL | Freq: Four times a day (QID) | ORAL | Status: DC | PRN
  Administered 2020-11-19: 30 mL via ORAL
  Filled 2020-11-19: qty 30

## 2020-11-19 MED ORDER — PANTOPRAZOLE SODIUM 40 MG PO TBEC
40.0000 mg | DELAYED_RELEASE_TABLET | Freq: Every day | ORAL | Status: DC
  Administered 2020-11-19 – 2020-11-20 (×2): 40 mg via ORAL
  Filled 2020-11-19 (×2): qty 1

## 2020-11-19 NOTE — Progress Notes (Signed)
PROGRESS NOTE    [name]  XTA:[id number] DOB: [birth year] DOA: 11/11/2020 PCP: [name], MD   Chief Complaint  Patient presents with  . Respiratory Distress  . Pneumonia    Brief Narrative:   55 YEAR old lady with h/o COPD, chronic diastolic heart failure, chronic hypoxic respiratory failure on 4 lit of [place] oxygen, OSA, presents to ED with sob, non compliant to BIPAP, was found to have pneumonia.  She was started on IV steroids, placed on bipap and admitted for further evaluation. She received Actemra, CT chest does not show any acute pulm embolism. She has completed remdesivir . Plan to continue with prednisone as per pccm recommendations.  Pt seen and examined, reports nasal congestion and sinusitis.  No chest pain or sob.  Used BIPAP at night.     Assessment & Plan:   Active Problems:   COVID-19   Respiratory distress   DNR (do not resuscitate)   Acute respiratory failure (HCC)   Acute on chronic hypoxic respiratory failure sec to COVID 19 /copd exacerbation: - completed actemra and remdesivir.  Continue with prednisone taper.   And taper oxygen as tolerated.  Wean her oxygen down to 4 L/min she denies any chest pain or shortness of breath at this time. Streptococcal antigen is negative.  Legionella antigen is negative. CRP has normalized.  No changes in medications at this time.  Her BiPAP or trilogy will be delivered on Monday  She need BIPAP for severe copd.  ABG shows ph of 7.4 with PCO2 OF 66 AND PO2 OF 71. Pt continues to exhibit signs of hypercapnia associated with chronic respiratory failure secondary to severe COPD. Interruption or failure to provide NIV would quickly lead to exacerbation of the patient's condition, hospital admission and likely harm to the patient. Continued use is preferred. The use of the NIV will treat patient's high p co2 levels and can reduce the risk of exacerbations and future hospitalizations when used at night and during the  day. BILEVEL/ rad has been tried previously and has been proven ineffective at managing this patient's hypercapnia. Ventilation is required to decreased the work of breathing and improve pulmonary status. Interruption of ventilator support would lead to decline of health status . Patient is able to protect her airway and clear secretions on their own.    Chronic diastolic heart failure:  - she appears compensated.  Continue strict intake and output. Echocardiogram showed left ventricular ejection fraction of 65 to 70%, no regional wall abnormalities, with indeterminate left ventricular diastolic parameters.  No new medications   Hypertension:  Blood pressure parameters appear to be optimal   Leukocytosis  Probably from steroids.  Stable around 14,000  Anxiety Increase the Xanax to twice daily as needed.  Insomnia Continue with trazodone.   Sinusitis and nasal congestion - flonase, and doxycycline for 3 days.     GERD: Continue with protonix.    DVT prophylaxis: (Lovenox) Code Status: DNR Family Communication:none a tbedside.   disposition:   Status is: Inpatient  Remains inpatient appropriate because:Unsafe d/c plan, IV treatments appropriate due to intensity of illness or inability to take PO and Inpatient level of care appropriate due to severity of illness   Dispo: The patient is from: Home              Anticipated d/c is to: Home              Patient currently is not medically stable to d/c.  Difficult to place patient No       Consultants:   PCCM    Procedures: None.    Antimicrobials: rocephin and zithromax    Subjective: appears comfortable on 4lit of [place] oxygen.  Objective: Vitals:   11/19/20 0546 11/19/20 0837 11/19/20 0925 11/19/20 1424  BP: (!) 155/110     Pulse: 81 90    Resp: 18 (!) 29 20 18   Temp: 97.9 F (36.6 C)   98.8 F (37.1 C)  TempSrc: Oral   Oral  SpO2: 97% 97%  95%  Weight:      Height:        Intake/Output Summary  (Last 24 hours) at 11/19/2020 1432 Last data filed at 11/19/2020 1200 Gross per 24 hour  Intake 240 ml  Output 1950 ml  Net -1710 ml   Filed Weights   11/11/20 1640 11/11/20 1727 11/14/20 2052  Weight: 84.6 kg 86 kg 88.4 kg    Examination:  General exam: alert and comfortable.  Respiratory system: air entry fair, no wheezin gheard, on 4lit of [place] oxygen.  Cardiovascular system:S1S2, RRR no JVD,  Gastrointestinal system: Abdomen is soft NT ND BS+ Central nervous system: ALERT and oriented, non focal Extremities: No edema.  Skin: No rashes seen.  Psychiatry: Mood is appropriate    Data Reviewed: I have personally reviewed following labs and imaging studies  CBC: Recent Labs  Lab 11/13/20 0343 11/14/20 0527 11/15/20 0601 11/16/20 0608  WBC 10.5 10.7* 14.2* 14.2*  NEUTROABS 9.4* 9.5* 10.0* 9.0*  HGB 11.9* 12.1 11.7* 12.0  HCT 41.5 41.8 39.5 40.8  MCV 91.2 91.1 87.8 88.9  PLT 239 246 234 234    Basic Metabolic Panel: Recent Labs  Lab 11/13/20 0343 11/14/20 0527 11/15/20 0601 11/16/20 0608 11/18/20 0305  NA 143 144 138 141  --   K 4.4 4.5 3.9 3.8  --   CL 94* 94* 91* 93*  --   CO2 41* 38* 39* 41*  --   GLUCOSE 160* 104* 69* 73  --   BUN 27* 36* 22* 16  --   CREATININE 0.68 0.62 0.53 0.59 0.56  CALCIUM 8.9 9.1 8.6* 8.4*  --   MG 2.5* 2.6* 2.5* 2.5*  --   PHOS 4.9* 5.2* 2.6 2.8  --     GFR: Estimated Creatinine Clearance: 83.8 mL/min (by C-G formula based on SCr of 0.56 mg/dL).  Liver Function Tests: Recent Labs  Lab 11/13/20 0343 11/14/20 0527 11/15/20 0601 11/16/20 0608  AST 16 22 21 20   ALT 19 22 21 21   ALKPHOS 49 49 46 45  BILITOT 0.4 0.7 0.8 0.8  PROT 6.4* 6.3* 5.9* 5.7*  ALBUMIN 3.2* 3.3* 3.2* 3.2*    CBG: Recent Labs  Lab 11/12/20 1703 11/13/20 0336  GLUCAP 182* 170*     Recent Results (from the past 240 hour(s))  Resp Panel by RT-PCR (Flu A&B, Covid) Nasopharyngeal Swab     Status: Abnormal   Collection Time: 11/11/20 12:16 PM    Specimen: Nasopharyngeal Swab; Nasopharyngeal(NP) swabs in vial transport medium  Result Value Ref Range Status   SARS Coronavirus 2 by RT PCR POSITIVE (A) NEGATIVE Final    Comment: RESULT CALLED TO, READ BACK BY AND VERIFIED WITH: HENRY AT 1322 ON 11/11/2020 BY JPM (NOTE) SARS-CoV-2 target nucleic acids are DETECTED.  The SARS-CoV-2 RNA is generally detectable in upper respiratory specimens during the acute phase of infection. Positive results are indicative of the presence of the identified virus, but do  not rule out bacterial infection or co-infection with other pathogens not detected by the test. Clinical correlation with patient history and other diagnostic information is necessary to determine patient infection status. The expected result is Negative.  Fact Sheet for Patients: BloggerCourse.com  Fact Sheet for Healthcare Providers: SeriousBroker.it  This test is not yet approved or cleared by the [place] FDA and  has been authorized for detection and/or diagnosis of SARS-CoV-2 by FDA under an Emergency Use Authorization (EUA).  This EUA will remain in effect (meaning this test can be  used) for the duration of  the COVID-19 declaration under Section 564(b)(1) of the Act, 21 U.S.C. section 360bbb-3(b)(1), unless the authorization is terminated or revoked sooner.     Influenza A by PCR NEGATIVE NEGATIVE Final   Influenza B by PCR NEGATIVE NEGATIVE Final    Comment: (NOTE) The Xpert Xpress SARS-CoV-2/FLU/RSV plus assay is intended as an aid in the diagnosis of influenza from Nasopharyngeal swab specimens and should not be used as a sole basis for treatment. Nasal washings and aspirates are unacceptable for Xpert Xpress SARS-CoV-2/FLU/RSV testing.  Fact Sheet for Patients: BloggerCourse.com  Fact Sheet for Healthcare Providers: SeriousBroker.it  This test is not yet  approved or cleared by the [place] FDA and has been authorized for detection and/or diagnosis of SARS-CoV-2 by FDA under an Emergency Use Authorization (EUA). This EUA will remain in effect (meaning this test can be used) for the duration of the COVID-19 declaration under Section 564(b)(1) of the Act, 21 U.S.C. section 360bbb-3(b)(1), unless the authorization is terminated or revoked.  Performed at [facility], [address] [place], [place] [address]   MRSA PCR Screening     Status: None   Collection Time: 11/11/20  4:55 PM   Specimen: Nasopharyngeal  Result Value Ref Range Status   MRSA by PCR NEGATIVE NEGATIVE Final    Comment:        The GeneXpert MRSA Assay (FDA approved for NASAL specimens only), is one component of a comprehensive MRSA colonization surveillance program. It is not intended to diagnose MRSA infection nor to guide or monitor treatment for MRSA infections. Performed at [facility], [address] [place], [place] [address]          Radiology Studies: No results found.      Scheduled Meds: . vitamin C  500 mg Oral Daily  . Chlorhexidine Gluconate Cloth  6 each Topical Daily  . doxycycline  100 mg Oral Q12H  . DULoxetine  30 mg Oral Daily  . enoxaparin (LOVENOX) injection  40 mg Subcutaneous Q24H  . fluticasone  2 spray Each Nare Daily  . gabapentin  400 mg Oral TID  . Ipratropium-Albuterol  1 puff Inhalation BID  . loratadine  10 mg Oral Daily  . mouth rinse  15 mL Mouth Rinse BID  . nicotine  14 mg Transdermal Daily  . pantoprazole  40 mg Oral Q0600  . polyethylene glycol  17 g Oral Daily  . predniSONE  40 mg Oral Daily  . senna-docusate  2 tablet Oral BID  . sodium chloride flush  10-40 mL Intracatheter Q12H  . zinc sulfate  220 mg Oral Daily   Continuous Infusions:    LOS: 8 days       Kathlen Mody, MD [facility]   To contact the attending provider between 7A-7P or  the covering provider during after hours 7P-7A, please log into the web site [email or web address] and access using  universal [place] password for that web site. If you do not have the password, please call the hospital operator.  11/19/2020, 2:32 PM

## 2020-11-19 NOTE — Plan of Care (Signed)

## 2020-11-20 DIAGNOSIS — J9602 Acute respiratory failure with hypercapnia: Secondary | ICD-10-CM | POA: Diagnosis not present

## 2020-11-20 DIAGNOSIS — U071 COVID-19: Secondary | ICD-10-CM | POA: Diagnosis not present

## 2020-11-20 DIAGNOSIS — J9601 Acute respiratory failure with hypoxia: Secondary | ICD-10-CM | POA: Diagnosis not present

## 2020-11-20 MED ORDER — ASCORBIC ACID 500 MG PO TABS: 500.0000 mg | ORAL_TABLET | Freq: Every day | ORAL | Status: DC

## 2020-11-20 MED ORDER — PREDNISONE 10 MG PO TABS: ORAL_TABLET | ORAL | 0 refills | Status: DC

## 2020-11-20 MED ORDER — SENNOSIDES-DOCUSATE SODIUM 8.6-50 MG PO TABS: 2.0000 | ORAL_TABLET | Freq: Two times a day (BID) | ORAL | Status: DC

## 2020-11-20 MED ORDER — DOXYCYCLINE HYCLATE 100 MG PO TABS: 100.0000 mg | ORAL_TABLET | Freq: Two times a day (BID) | ORAL | 0 refills | Status: AC

## 2020-11-20 MED ORDER — TRAZODONE HCL 50 MG PO TABS: 50.0000 mg | ORAL_TABLET | Freq: Every evening | ORAL | 0 refills | Status: DC | PRN

## 2020-11-20 MED ORDER — ZINC SULFATE 220 (50 ZN) MG PO CAPS: 220.0000 mg | ORAL_CAPSULE | Freq: Every day | ORAL | Status: DC

## 2020-11-20 NOTE — Discharge Summary (Signed)
Physician Discharge Summary  [name]:[id number] DOB: July 10, 1966 DOA: 11/11/2020  PCP: [name], MD  Admit date: 11/11/2020 Discharge date: 11/20/2020  Admitted From: Home  Disposition:  Home.   Recommendations for Outpatient Follow-up:  1. Follow up with PCP in 1-2 weeks 2. Please obtain BMP/CBC in one week 3. Please follow up with pulmonology in 1 to 2 weeks.   Home Health: Yes  Discharge Condition: stable.  CODE STATUS: Diet recommendation: Heart Healthy  Brief/Interim Summary:  55 YEAR old lady with h/o COPD, chronic diastolic heart failure, chronic hypoxic respiratory failure on 4 lit of [place] oxygen, OSA, presents to ED with sob, non compliant to BIPAP, was found to have pneumonia.  She was started on IV steroids, placed on bipap and admitted for further evaluation. She received Actemra, CT chest does not show any acute pulm embolism. She has completed remdesivir . Plan to continue with prednisone as per pccm recommendations.     Discharge Diagnoses:  Active Problems:   COVID-19   Respiratory distress   DNR (do not resuscitate)   Acute respiratory failure (HCC)  Acute on chronic hypoxic respiratory failure sec to COVID 19 /copd exacerbation: - completed actemra and remdesivir.  Continue with prednisone taper.   And taper oxygen as tolerated.  Wean her oxygen down to 4 L/min she denies any chest pain or shortness of breath at this time. Streptococcal antigen is negative.  Legionella antigen is negative. CRP has normalized.  No changes in medications at this time.  Her BiPAP or trilogy will be delivered on Monday  She need BIPAP for severe copd.  ABG shows ph of 7.4 with PCO2 OF 66 AND PO2 OF 71. Pt continues to exhibit signs of hypercapnia associated with chronic respiratory failure secondary to severe COPD. Interruption or failure to provide NIV would quickly lead to exacerbation of the patient's condition, hospital admission and likely harm to the  patient. Continued use is preferred. The use of the NIV will treat patient's high p co2 levels and can reduce the risk of exacerbations and future hospitalizations when used at night and during the day. BILEVEL/ rad has been tried previously and has been proven ineffective at managing this patient's hypercapnia. Ventilation is required to decreased the work of breathing and improve pulmonary status. Interruption of ventilator support would lead to decline of health status . Patient is able to protect her airway and clear secretions on their own.    Chronic diastolic heart failure:  - she appears compensated.  Continue strict intake and output. Echocardiogram showed left ventricular ejection fraction of 65 to 70%, no regional wall abnormalities, with indeterminate left ventricular diastolic parameters.  No new medications   Hypertension:  Blood pressure parameters appear to be optimal   Leukocytosis  Probably from steroids.  Stable around 14,000  Anxiety Improved.   Insomnia Continue with trazodone.   Sinusitis and nasal congestion - flonase, and doxycycline for 3 days.     GERD: Continue with protonix.   Discharge Instructions  Discharge Instructions    Diet - low sodium heart healthy   Complete by: As directed    Discharge instructions   Complete by: As directed    Please follow up with PCP in one week.  Please follow up with Pulmonology in one week.     Allergies as of 11/20/2020      Reactions   Amoxicillin-pot Clavulanate Itching   Ok with benadryl Did it involve swelling of the face/tongue/throat, SOB, or  low BP? No Did it involve sudden or severe rash/hives, skin peeling, or any reaction on the inside of your mouth or nose? No Did you need to seek medical attention at a hospital or doctor's office? No When did it last happen?6 months If all above answers are "NO", may proceed with cephalosporin use.   Erythromycin Rash   Ok with benadryl     Moxifloxacin Itching   Reaction to Avelox - ok with benadryl   Sulfamethoxazole-trimethoprim Itching   Ok with benadryl      Medication List    STOP taking these medications   albuterol (2.5 MG/3ML) 0.083% nebulizer solution Commonly known as: PROVENTIL   aspirin 81 MG chewable tablet   azithromycin 250 MG tablet Commonly known as: ZITHROMAX   levofloxacin 500 MG tablet Commonly known as: LEVAQUIN     TAKE these medications   ascorbic acid 500 MG tablet Commonly known as: VITAMIN C Take 1 tablet (500 mg total) by mouth daily. Start taking on: November 21, 2020   Breztri Aerosphere 160-9-4.8 MCG/ACT Aero Generic drug: Budeson-Glycopyrrol-Formoterol Inhale 2 puffs into the lungs in the morning and at bedtime.   cetirizine 10 MG tablet Commonly known as: ZYRTEC Take 10 mg daily by mouth.   COLLAGEN PO Take 1,000 mg by mouth 3 (three) times daily.   dextromethorphan-guaiFENesin 30-600 MG 12hr tablet Commonly known as: MUCINEX DM Take 1 tablet by mouth 2 (two) times daily.   doxycycline 100 MG tablet Commonly known as: VIBRA-TABS Take 1 tablet (100 mg total) by mouth every 12 (twelve) hours for 2 days.   DULoxetine 30 MG capsule Commonly known as: CYMBALTA Take 30 mg by mouth daily.   fluticasone 50 MCG/ACT nasal spray Commonly known as: FLONASE Place 1 spray into both nostrils daily as needed for allergies.   furosemide 20 MG tablet Commonly known as: Lasix Take 1 tablet (20 mg total) by mouth daily as needed for edema (weight gain of 3lbs in 1 day or 5 lbs in 2 days.). What changed: when to take this   gabapentin 400 MG capsule Commonly known as: NEURONTIN Take 400 mg by mouth 3 (three) times daily.   HYDROcodone-acetaminophen 10-325 MG tablet Commonly known as: NORCO Take 1 tablet by mouth every 6 (six) hours as needed (breakthrough pain). What changed: when to take this   losartan-hydrochlorothiazide 100-12.5 MG tablet Commonly known as: HYZAAR Take 1  tablet by mouth daily.   montelukast 10 MG tablet Commonly known as: SINGULAIR Take 10 mg at bedtime by mouth.   morphine 30 MG 12 hr tablet Commonly known as: MS CONTIN Take 30 mg by mouth every 12 (twelve) hours.   nystatin-triamcinolone ointment Commonly known as: MYCOLOG Apply 1 application topically 2 (two) times daily as needed (rash).   OXYGEN Inhale 4 L into the lungs continuous.   pantoprazole 40 MG tablet Commonly known as: PROTONIX Take 40 mg by mouth 2 (two) times daily.   polyethylene glycol powder 17 GM/SCOOP powder Commonly known as: GLYCOLAX/MIRALAX Take 17 g by mouth daily as needed for mild constipation.   predniSONE 10 MG tablet Commonly known as: DELTASONE Prednisone 30 mg daily for 3 days followed by  Prednisone 20 mg daily for 3 days followed by  Prendisone 10 mg daily for 3 days. What changed:   medication strength  how much to take  how to take this  when to take this  additional instructions  Another medication with the same name was removed. Continue taking this  medication, and follow the directions you see here.   senna-docusate 8.6-50 MG tablet Commonly known as: Senokot-S Take 2 tablets by mouth 2 (two) times daily.   Vitamin D (Ergocalciferol) 1.25 MG (50000 UNIT) Caps capsule Commonly known as: DRISDOL Take 50,000 Units by mouth every Monday.   zinc sulfate 220 (50 Zn) MG capsule Take 1 capsule (220 mg total) by mouth daily. Start taking on: November 21, 2020       Allergies  Allergen Reactions  . Amoxicillin-Pot Clavulanate Itching    Ok with benadryl Did it involve swelling of the face/tongue/throat, SOB, or low BP? No Did it involve sudden or severe rash/hives, skin peeling, or any reaction on the inside of your mouth or nose? No Did you need to seek medical attention at a hospital or doctor's office? No When did it last happen?6 months If all above answers are "NO", may proceed with cephalosporin use.   .  Erythromycin Rash    Ok with benadryl   . Moxifloxacin Itching    Reaction to Avelox - ok with benadryl  . Sulfamethoxazole-Trimethoprim Itching    Ok with benadryl    Consultations:  PCCM   Procedures/Studies: CT ANGIO CHEST PE W OR WO CONTRAST  Result Date: 11/11/2020 CLINICAL DATA:  High probability of pulmonary embolism suspected. Shortness of breath for several days. COPD. EXAM: CT ANGIOGRAPHY CHEST WITH CONTRAST TECHNIQUE: Multidetector CT imaging of the chest was performed using the standard protocol during bolus administration of intravenous contrast. Multiplanar CT image reconstructions and MIPs were obtained to evaluate the vascular anatomy. CONTRAST:  OMNIPAQUE IOHEXOL 350 MG/ML SOLN COMPARISON:  None. FINDINGS: Cardiovascular: No filling defects within the pulmonary arteries to suggest acute pulmonary embolism. Mediastinum/Nodes: No axillary or supraclavicular adenopathy. No mediastinal or hilar adenopathy. No pericardial fluid. Esophagus normal. Paratracheal lymph nodes are upper limits of normal at 8-9 mm. Lungs/Pleura: Within the periphery of the RIGHT lower lobe there is interstitial thickening and peribronchial thickening with collapse since along the pleural surface (image 96/6). Finding extend into the superior segment of the RIGHT lower lobe is seen on sagittal image 38/8). Similar findings in the inferior lateral aspect of the RIGHT middle lobe where there is a fine branching nodular pattern (image 87/6. Upper Abdomen: Limited view of the liver, kidneys, pancreas are unremarkable. Normal adrenal glands. Musculoskeletal: No acute osseous abnormality. Review of the MIP images confirms the above findings. IMPRESSION: 1. No evidence acute pulmonary embolism. 2. Peribronchial thickening and consolidation in the periphery of the RIGHT lower lobe and tree-in-bud but pattern in the RIGHT middle lobe. Findings are concerning for early multilobar pneumonia within the RIGHT lung. 3.  Borderline enlarged mediastinal lymph nodes. Electronically Signed   By: Genevive Bi M.D.   On: 11/11/2020 16:39   DG CHEST PORT 1 VIEW  Result Date: 11/12/2020 CLINICAL DATA:  COVID positive.  Shortness of breath. EXAM: PORTABLE CHEST 1 VIEW COMPARISON:  Radiograph and CT yesterday. FINDINGS: Emphysema and hyperinflation with central bronchial thickening. Ill-defined patchy bibasilar opacities, similar in the right and worsening on the left. Stable heart size and mediastinal contours. No pneumothorax or pleural effusion. Surgical change in the left proximal humerus and lower cervical spine. IMPRESSION: 1. Ill-defined patchy bibasilar opacities, similar in the right and worsening on the left, pattern consistent with pneumonia. 2. Emphysema, bronchial thickening, and hyperinflation. Electronically Signed   By: Narda Rutherford M.D.   On: 11/12/2020 15:07   DG Chest Portable 1 View  Result Date: 11/11/2020 CLINICAL  DATA:  Respiratory distress. Recent diagnosis of pneumonia. EXAM: PORTABLE CHEST 1 VIEW COMPARISON:  Single-view of the chest 09/06/2020 and 07/25/2020. PA and lateral chest 05/19/2018. FINDINGS: Streaky bibasilar opacities are chronic. The lungs are otherwise clear. Heart size is normal. No pneumothorax or pleural fluid. No acute or focal bony abnormality. IMPRESSION: No acute disease. Streaky bibasilar opacities are chronic and may be due to scar or fibrosis. Electronically Signed   By: Drusilla Kanner M.D.   On: 11/11/2020 12:23   VAS [place] LOWER EXTREMITY VENOUS (DVT)  Result Date: 11/14/2020  Lower Venous DVT Study Indications: Covid+.  Comparison Study: Previous exam 09/06/20 - negative Performing Technologist: Ernestene Mention  Examination Guidelines: A complete evaluation includes B-mode imaging, spectral Doppler, color Doppler, and power Doppler as needed of all accessible portions of each vessel. Bilateral testing is considered an integral part of a complete examination. Limited examinations  for reoccurring indications may be performed as noted. The reflux portion of the exam is performed with the patient in reverse Trendelenburg.  +---------+---------------+---------+-----------+----------+--------------+ RIGHT    CompressibilityPhasicitySpontaneityPropertiesThrombus Aging +---------+---------------+---------+-----------+----------+--------------+ CFV      Full           Yes      Yes                                 +---------+---------------+---------+-----------+----------+--------------+ SFJ      Full                                                        +---------+---------------+---------+-----------+----------+--------------+ FV Prox  Full           Yes      Yes                                 +---------+---------------+---------+-----------+----------+--------------+ FV Mid   Full           Yes      Yes                                 +---------+---------------+---------+-----------+----------+--------------+ FV DistalFull           Yes      Yes                                 +---------+---------------+---------+-----------+----------+--------------+ PFV      Full                                                        +---------+---------------+---------+-----------+----------+--------------+ POP      Full           Yes      Yes                                 +---------+---------------+---------+-----------+----------+--------------+ PTV      Full                                                        +---------+---------------+---------+-----------+----------+--------------+  PERO     Full                                                        +---------+---------------+---------+-----------+----------+--------------+   +---------+---------------+---------+-----------+----------+--------------+ LEFT     CompressibilityPhasicitySpontaneityPropertiesThrombus Aging  +---------+---------------+---------+-----------+----------+--------------+ CFV      Full           Yes      Yes                                 +---------+---------------+---------+-----------+----------+--------------+ SFJ      Full                                                        +---------+---------------+---------+-----------+----------+--------------+ FV Prox  Full           Yes      Yes                                 +---------+---------------+---------+-----------+----------+--------------+ FV Mid   Full           Yes      Yes                                 +---------+---------------+---------+-----------+----------+--------------+ FV DistalFull           Yes      Yes                                 +---------+---------------+---------+-----------+----------+--------------+ PFV      Full                                                        +---------+---------------+---------+-----------+----------+--------------+ POP      Full           Yes      Yes                                 +---------+---------------+---------+-----------+----------+--------------+ PTV      Full                                                        +---------+---------------+---------+-----------+----------+--------------+ PERO     Full                                                        +---------+---------------+---------+-----------+----------+--------------+  Summary: BILATERAL: - No evidence of deep vein thrombosis seen in the lower extremities, bilaterally. - No evidence of superficial venous thrombosis in the lower extremities, bilaterally. -No evidence of popliteal cyst, bilaterally.   *See table(s) above for measurements and observations. Electronically signed by Waverly Ferrarihristopher Dickson MD on 11/14/2020 at 5:00:29 PM.    Final        Subjective: No new complaints.   Discharge Exam: Vitals:   11/19/20 2222 11/20/20 0610  BP: (!) 135/99 (!)  139/99  Pulse: 92 84  Resp: 18 18  Temp: 98.1 F (36.7 C) 98.6 F (37 C)  SpO2: 93% 99%   Vitals:   11/19/20 1643 11/19/20 2121 11/19/20 2222 11/20/20 0610  BP: (!) 156/101 (!) 161/102 (!) 135/99 (!) 139/99  Pulse: (!) 101 99 92 84  Resp:  18 18 18   Temp:  98.5 F (36.9 C) 98.1 F (36.7 C) 98.6 F (37 C)  TempSrc:  Oral Oral Oral  SpO2:  91% 93% 99%  Weight:      Height:        General: Pt is alert, awake, not in acute distress Cardiovascular: RRR, S1/S2 +, no rubs, no gallops Respiratory: CTA bilaterally, no wheezing, no rhonchi Abdominal: Soft, NT, ND, bowel sounds + Extremities: no edema, no cyanosis    The results of significant diagnostics from this hospitalization (including imaging, microbiology, ancillary and laboratory) are listed below for reference.     Microbiology: Recent Results (from the past 240 hour(s))  Resp Panel by RT-PCR (Flu A&B, Covid) Nasopharyngeal Swab     Status: Abnormal   Collection Time: 11/11/20 12:16 PM   Specimen: Nasopharyngeal Swab; Nasopharyngeal(NP) swabs in vial transport medium  Result Value Ref Range Status   SARS Coronavirus 2 by RT PCR POSITIVE (A) NEGATIVE Final    Comment: RESULT CALLED TO, READ BACK BY AND VERIFIED WITH: HENRY AT 1322 ON 11/11/2020 BY JPM (NOTE) SARS-CoV-2 target nucleic acids are DETECTED.  The SARS-CoV-2 RNA is generally detectable in upper respiratory specimens during the acute phase of infection. Positive results are indicative of the presence of the identified virus, but do not rule out bacterial infection or co-infection with other pathogens not detected by the test. Clinical correlation with patient history and other diagnostic information is necessary to determine patient infection status. The expected result is Negative.  Fact Sheet for Patients: BloggerCourse.comhttps://www.fda.gov/media/152166/download  Fact Sheet for Healthcare Providers: SeriousBroker.ithttps://www.fda.gov/media/152162/download  This test is not yet  approved or cleared by the [place] FDA and  has been authorized for detection and/or diagnosis of SARS-CoV-2 by FDA under an Emergency Use Authorization (EUA).  This EUA will remain in effect (meaning this test can be  used) for the duration of  the COVID-19 declaration under Section 564(b)(1) of the Act, 21 U.S.C. section 360bbb-3(b)(1), unless the authorization is terminated or revoked sooner.     Influenza A by PCR NEGATIVE NEGATIVE Final   Influenza B by PCR NEGATIVE NEGATIVE Final    Comment: (NOTE) The Xpert Xpress SARS-CoV-2/FLU/RSV plus assay is intended as an aid in the diagnosis of influenza from Nasopharyngeal swab specimens and should not be used as a sole basis for treatment. Nasal washings and aspirates are unacceptable for Xpert Xpress SARS-CoV-2/FLU/RSV testing.  Fact Sheet for Patients: BloggerCourse.comhttps://www.fda.gov/media/152166/download  Fact Sheet for Healthcare Providers: SeriousBroker.ithttps://www.fda.gov/media/152162/download  This test is not yet approved or cleared by the [place] FDA and has been authorized for detection and/or diagnosis of SARS-CoV-2 by FDA under an Emergency Use Authorization (EUA).  This EUA will remain in effect (meaning this test can be used) for the duration of the COVID-19 declaration under Section 564(b)(1) of the Act, 21 U.S.C. section 360bbb-3(b)(1), unless the authorization is terminated or revoked.  Performed at [facility], [address], [place], [place] [address]   MRSA PCR Screening     Status: None   Collection Time: 11/11/20  4:55 PM   Specimen: Nasopharyngeal  Result Value Ref Range Status   MRSA by PCR NEGATIVE NEGATIVE Final    Comment:        The GeneXpert MRSA Assay (FDA approved for NASAL specimens only), is one component of a comprehensive MRSA colonization surveillance program. It is not intended to diagnose MRSA infection nor to guide or monitor treatment for MRSA infections. Performed at  [facility], [address] [place], [place] [address]      Labs: BNP (last 3 results) Recent Labs    09/06/20 0555 11/11/20 1216  BNP 203.6* 102.0*   Basic Metabolic Panel: Recent Labs  Lab 11/14/20 0527 11/15/20 0601 11/16/20 0608 11/18/20 0305  NA 144 138 141  --   K 4.5 3.9 3.8  --   CL 94* 91* 93*  --   CO2 38* 39* 41*  --   GLUCOSE 104* 69* 73  --   BUN 36* 22* 16  --   CREATININE 0.62 0.53 0.59 0.56  CALCIUM 9.1 8.6* 8.4*  --   MG 2.6* 2.5* 2.5*  --   PHOS 5.2* 2.6 2.8  --    Liver Function Tests: Recent Labs  Lab 11/14/20 0527 11/15/20 0601 11/16/20 0608  AST 22 21 20   ALT 22 21 21   ALKPHOS 49 46 45  BILITOT 0.7 0.8 0.8  PROT 6.3* 5.9* 5.7*  ALBUMIN 3.3* 3.2* 3.2*   No results for input(s): LIPASE, AMYLASE in the last 168 hours. No results for input(s): AMMONIA in the last 168 hours. CBC: Recent Labs  Lab 11/14/20 0527 11/15/20 0601 11/16/20 0608  WBC 10.7* 14.2* 14.2*  NEUTROABS 9.5* 10.0* 9.0*  HGB 12.1 11.7* 12.0  HCT 41.8 39.5 40.8  MCV 91.1 87.8 88.9  PLT 246 234 234   Cardiac Enzymes: No results for input(s): CKTOTAL, CKMB, CKMBINDEX, TROPONINI in the last 168 hours. BNP: Invalid input(s): POCBNP CBG: No results for input(s): GLUCAP in the last 168 hours. D-Dimer No results for input(s): DDIMER in the last 72 hours. Hgb A1c No results for input(s): HGBA1C in the last 72 hours. Lipid Profile No results for input(s): CHOL, HDL, LDLCALC, TRIG, CHOLHDL, LDLDIRECT in the last 72 hours. Thyroid function studies No results for input(s): TSH, T4TOTAL, T3FREE, THYROIDAB in the last 72 hours.  Invalid input(s): FREET3 Anemia work up No results for input(s): VITAMINB12, FOLATE, FERRITIN, TIBC, IRON, RETICCTPCT in the last 72 hours. Urinalysis    Component Value Date/Time   COLORURINE STRAW (A) 02/15/2019 1309   APPEARANCEUR CLEAR 02/15/2019 1309   LABSPEC 1.002 (L) 02/15/2019 1309   PHURINE 7.0 02/15/2019 1309    GLUCOSEU NEGATIVE 02/15/2019 1309   HGBUR NEGATIVE 02/15/2019 1309   BILIRUBINUR NEGATIVE 02/15/2019 1309   KETONESUR NEGATIVE 02/15/2019 1309   PROTEINUR NEGATIVE 02/15/2019 1309   UROBILINOGEN 0.2 03/31/2012 1338   NITRITE NEGATIVE 02/15/2019 1309   LEUKOCYTESUR NEGATIVE 02/15/2019 1309   Sepsis Labs Invalid input(s): PROCALCITONIN,  WBC,  LACTICIDVEN Microbiology Recent Results (from the past 240 hour(s))  Resp Panel by RT-PCR (Flu A&B, Covid) Nasopharyngeal Swab  Status: Abnormal   Collection Time: 11/11/20 12:16 PM   Specimen: Nasopharyngeal Swab; Nasopharyngeal(NP) swabs in vial transport medium  Result Value Ref Range Status   SARS Coronavirus 2 by RT PCR POSITIVE (A) NEGATIVE Final    Comment: RESULT CALLED TO, READ BACK BY AND VERIFIED WITH: HENRY AT 1322 ON 11/11/2020 BY JPM (NOTE) SARS-CoV-2 target nucleic acids are DETECTED.  The SARS-CoV-2 RNA is generally detectable in upper respiratory specimens during the acute phase of infection. Positive results are indicative of the presence of the identified virus, but do not rule out bacterial infection or co-infection with other pathogens not detected by the test. Clinical correlation with patient history and other diagnostic information is necessary to determine patient infection status. The expected result is Negative.  Fact Sheet for Patients: BloggerCourse.com  Fact Sheet for Healthcare Providers: SeriousBroker.it  This test is not yet approved or cleared by the [place] FDA and  has been authorized for detection and/or diagnosis of SARS-CoV-2 by FDA under an Emergency Use Authorization (EUA).  This EUA will remain in effect (meaning this test can be  used) for the duration of  the COVID-19 declaration under Section 564(b)(1) of the Act, 21 U.S.C. section 360bbb-3(b)(1), unless the authorization is terminated or revoked sooner.     Influenza A by PCR  NEGATIVE NEGATIVE Final   Influenza B by PCR NEGATIVE NEGATIVE Final    Comment: (NOTE) The Xpert Xpress SARS-CoV-2/FLU/RSV plus assay is intended as an aid in the diagnosis of influenza from Nasopharyngeal swab specimens and should not be used as a sole basis for treatment. Nasal washings and aspirates are unacceptable for Xpert Xpress SARS-CoV-2/FLU/RSV testing.  Fact Sheet for Patients: BloggerCourse.com  Fact Sheet for Healthcare Providers: SeriousBroker.it  This test is not yet approved or cleared by the [place] FDA and has been authorized for detection and/or diagnosis of SARS-CoV-2 by FDA under an Emergency Use Authorization (EUA). This EUA will remain in effect (meaning this test can be used) for the duration of the COVID-19 declaration under Section 564(b)(1) of the Act, 21 U.S.C. section 360bbb-3(b)(1), unless the authorization is terminated or revoked.  Performed at [facility], [address] [place], [place] [address]   MRSA PCR Screening     Status: None   Collection Time: 11/11/20  4:55 PM   Specimen: Nasopharyngeal  Result Value Ref Range Status   MRSA by PCR NEGATIVE NEGATIVE Final    Comment:        The GeneXpert MRSA Assay (FDA approved for NASAL specimens only), is one component of a comprehensive MRSA colonization surveillance program. It is not intended to diagnose MRSA infection nor to guide or monitor treatment for MRSA infections. Performed at [facility], [address], [place], [place] [address]      Time coordinating discharge: 34 minutes.   SIGNED:   Kathlen Mody, MD  [facility]

## 2020-11-20 NOTE — Progress Notes (Signed)
RN reviewed discharge paperwork with patient. Midline removed by this RN, clarified with Cyndia Diver IVT RN, that floor RN could remove. Rosey Bath verified. Telemetry monitor removed. Pt's husband left to go switch cars and bring back her home portable O2 tank. All questions addressed. Reinforced that [employer] would be bringing out her trilogy and explain how to use once she gets home per [facility] with [employer].  No further needs at this time.

## 2020-11-20 NOTE — TOC Progression Note (Signed)
Transition of Care [facility]) - Progression Note    Patient Details  Name: [name] MRN: [id number] Date of Birth: 1966/07/19  Transition of Care [facility] CM/SW Contact  Geni Bers, RN Phone Number: 11/20/2020, 12:23 PM  Clinical Narrative:    Pt will discharge home with [facility] and [employer] for home Trilogy and O2/selected by pt and husband. Spoke with pt's husband who states that he will take pt home, pt has RW, BSC at home.    Expected Discharge Plan: Home/Self Care Barriers to Discharge: Continued Medical Work up  Expected Discharge Plan and Services Expected Discharge Plan: Home/Self Care   Discharge Planning Services: CM Consult   Living arrangements for the past 2 months: Single Family Home Expected Discharge Date: 11/20/20                                     Social Determinants of Health (SDOH) Interventions    Readmission Risk Interventions No flowsheet data found.

## 2020-11-20 NOTE — Care Management Important Message (Signed)
Important Message  Patient Details IM Letter placed in Patient's door caddy. Name: [name] MRN: [id number] Date of Birth: Jun 11, 1966   Medicare Important Message Given:  Yes     Caren Macadam 11/20/2020, 12:57 PM

## 2020-11-20 NOTE — Plan of Care (Signed)

## 2020-11-20 NOTE — Progress Notes (Signed)
Physical Therapy Treatment Patient Details Name: [name] MRN: [id number] DOB: 05-29-1966 Today's Date: 11/20/2020    History of Present Illness 55 year old lady with h/o COPD, chronic diastolic heart failure, chronic hypoxic respiratory failure on 4 lit of [place] oxygen, OSA, presents to ED with sob, non compliant to BIPAP, was found to have pneumonia.  Pt admitted for acute on chronic hypoxic respiratory failure sec to COVID 19 /copd exacerbation    PT Comments    Pt reports not being OOB since previous visit however requesting to use BSC and ambulate today.  Pt assisted to [facility] and then recliner for short breathing rest break.  Pt only able to ambulate 8 feet with RW due to fatigue.  Pt reports increasing O2 liters at home for activity "when sick" and requested increased O2 for activity today.  Pt on 5L O2 HFNC for transfer and SPO2 98% (reading 84% upon transfer however monitor on finger and quickly increased to 98% with no grip).  Pt on 6L O2 HFNC for ambulating and SPO2 95% end of ambulation.  Pt returned to resting 4L O2 end of session.  Encouraged pt to again be OOB daily and start using BSC at least (still with pure wick today as well).   Follow Up Recommendations  Home health PT     Equipment Recommendations  None recommended by PT    Recommendations for Other Services       Precautions / Restrictions Precautions Precautions: Fall Precaution Comments: chronic 4L O2 [place] at home    Mobility  Bed Mobility Overal bed mobility: Needs Assistance Bed Mobility: Supine to Sit     Supine to sit: Supervision;HOB elevated     General bed mobility comments: pt requested elevated HOB and utilized hand rail to self assist (sleeps in recliner)    Transfers Overall transfer level: Needs assistance Equipment used: Rolling walker (2 wheeled) Transfers: Sit to/from [employer] Sit to Stand: Min guard Stand pivot transfers: Min guard       General transfer  comment: min/guard for safety, pt used UE support through armrests; assisted to [facility] and then recliner; required rest break prior to ambulating  Ambulation/Gait Ambulation/Gait assistance: Min guard Gait Distance (Feet): 8 Feet Assistive device: Rolling walker (2 wheeled) Gait Pattern/deviations: Step-through pattern;Decreased stride length     General Gait Details: pt reports dyspnea with little activity, SPO2 95% upon sitting in recliner on 6L O2 after ambulating, distance per pt tolerance; pt also reporting anxiety   Stairs             Wheelchair Mobility    Modified Rankin (Stroke Patients Only)       Balance                                            Cognition Arousal/Alertness: Awake/alert Behavior During Therapy: WFL for tasks assessed/performed;Anxious Overall Cognitive Status: Within Functional Limits for tasks assessed                                        Exercises      General Comments        Pertinent Vitals/Pain Pain Assessment: No/denies pain    Home Living  Prior Function            PT Goals (current goals can now be found in the care plan section) Progress towards PT goals: Progressing toward goals    Frequency    Min 3X/week      PT Plan Current plan remains appropriate    Co-evaluation              AM-PAC PT "6 Clicks" Mobility   Outcome Measure  Help needed turning from your back to your side while in a flat bed without using bedrails?: None Help needed moving from lying on your back to sitting on the side of a flat bed without using bedrails?: A Little Help needed moving to and from a bed to a chair (including a wheelchair)?: A Little Help needed standing up from a chair using your arms (e.g., wheelchair or bedside chair)?: A Little Help needed to walk in hospital room?: A Little Help needed climbing 3-5 steps with a railing? : A Lot 6 Click Score:  18    End of Session Equipment Utilized During Treatment: Oxygen;Gait belt Activity Tolerance: Patient tolerated treatment well Patient left: in chair;with call bell/phone within reach;with family/visitor present Nurse Communication: Mobility status PT Visit Diagnosis: Other abnormalities of gait and mobility (R26.89)     Time: 1030-1102 PT Time Calculation (min) (ACUTE ONLY): 32 min  Charges:  $Gait Training: 8-22 mins $Therapeutic Activity: 8-22 mins                    Thomasene Mohair PT, DPT Acute Rehabilitation Services Pager: [phone] Office: [phone]  Maida Sale E 11/20/2020, 12:35 PM

## 2020-11-28 ENCOUNTER — Inpatient Hospital Stay: Payer: Medicare Other | Admitting: Adult Health

## 2020-12-14 ENCOUNTER — Encounter: Payer: Self-pay | Admitting: Neurology

## 2020-12-14 ENCOUNTER — Other Ambulatory Visit: Payer: Self-pay

## 2020-12-14 ENCOUNTER — Ambulatory Visit: Payer: Medicare Other | Admitting: Neurology

## 2020-12-14 ENCOUNTER — Telehealth: Payer: Self-pay | Admitting: Neurology

## 2020-12-14 VITALS — BP 155/104 | HR 106

## 2020-12-14 DIAGNOSIS — R519 Headache, unspecified: Secondary | ICD-10-CM | POA: Diagnosis not present

## 2020-12-14 NOTE — Telephone Encounter (Signed)
[employer] medicare order sent to [facility]. No auth they will reach out to the patient to schedule.  

## 2020-12-14 NOTE — Progress Notes (Signed)
Chief Complaint  Patient presents with  . New Patient (Initial Visit)    She is here with her husband, [name]. Reports frequent headaches (2-5 days per week with varying degrees of pain). She has never been on a preventive medication. She is also concerned about neck pain radiating in her bilateral shoulder and arms (right side worse than left). Hospitalization due to Covid19/pneumonia in March 2022.      ASSESSMENT AND PLAN  [name] is a 55 y.o. female   New onset of headache  Complicated medical history including COPD, obstructive sleep apnea, hypoxic respiratory failure, oxygen dependent, noncompliant with her BiPAP machine, chronic pain, previous neck surgery, recurrent neck pain,  All likely contributed to her headache,  MRI of the brain to rule out structural abnormality  She will continue to work with her pain management   DIAGNOSTIC DATA (LABS, IMAGING, TESTING) - I reviewed patient records, labs, notes, testing and imaging myself where available.  Laboratory evaluation in 2022: Magnesium  2.5, ferritin 89, elevated D-dimer up to 1.16, C-reactive protein 0.8, CMP, creatinine of 0.59, CBC, elevated WBC 14.2, hemoglobin of 12,  A1c 6.1 LDL 113   HISTORICAL  [name] is a 55 year old female, seen in request by her pain management doctor. Ardell Isaacs, for evaluation of new onset headache, her primary care physician is Dr. Cyndia Bent, Kayleen Memos, initial evaluation was on December 14, 2020  I reviewed and summarized the referring note.  Past medical history COPD, chronic hypoxic respiratory failure on 4 L of oxygen Obstructive sleep apnea Chronic diastolic heart failure Smoker ppd,   Patient was a longtime smoker, still smoke half pack a day, COPD, obstructive sleep apnea, oxygen dependent, has been treated by low-dose prednisone persistently for many years, developed moon face, gait abnormality, obesity, chronic neck, low back pain, history of cervical decompression  surgery, currently under pain management, also on treatment for her depression anxiety,   She was admitted to the hospital from March 5-14 for Covid pneumonia, hypoxic respiratory failure, required BiPAP, but poor compliance, was treated with remdesivir, and Actemra.  Patient reported new onset of headaches since 2021, usually starting from the neck, spreading forward, become bilateral occipital, frontal pressure headaches, open relieved by neck stretching, cord compression to her neck, the headache can last all day, moderate pressure, with light noise sensitivity, she will often take her scheduled pain medication, Norco 10/325 mg as needed, sleep was also helpful,  Since her Covid admission, she complains worsening deconditioning, increased to headache, generalized weakness, hope to proceed neurological evaluation to rule out central nervous system etiology  She denies visual loss, generalized weakness, no significant asymmetry  REVIEW OF SYSTEMS: Full 14 system review of systems performed and notable only for as above All other review of systems were negative.  PHYSICAL EXAM   Vitals:   12/14/20 0951  BP: (!) 155/104  Pulse: (!) 106   Not recorded     There is no height or weight on file to calculate BMI.  PHYSICAL EXAMNIATION:  Gen: NAD, conversant, well nourised, well groomed                     Cardiovascular: Regular rate rhythm, no peripheral edema, warm, nontender. Eyes: Conjunctivae clear without exudates or hemorrhage Neck: Supple, no carotid bruits. Pulmonary: Wheezing sound  NEUROLOGICAL EXAM:  MENTAL STATUS: moon face, obese, ill looking middle-aged female Speech:    Speech is normal; fluent and spontaneous with normal comprehension.  Cognition:  Orientation to time, place and person     Normal recent and remote memory     Normal Attention span and concentration     Normal Language, naming, repeating,spontaneous speech     Fund of knowledge   CRANIAL  NERVES: CN II: Visual fields are full to confrontation. Pupils are round equal and briskly reactive to light. CN III, IV, VI: extraocular movement are normal. No ptosis. CN V: Facial sensation is intact to light touch CN VII: Face is symmetric with normal eye closure  CN VIII: Hearing is normal to causal conversation. CN IX, X: Phonation is normal. CN XI: Head turning and shoulder shrug are intact  MOTOR: There is no pronator drift of out-stretched arms. Muscle bulk and tone are normal. Muscle strength is normal.  REFLEXES: Reflexes are 2+ and symmetric at the biceps, triceps, knees, and ankles. Plantar responses are flexor.  SENSORY: Intact to light touch, pinprick and vibratory sensation are intact in fingers and toes.  COORDINATION: There is no trunk or limb dysmetria noted.  GAIT/STANCE: Need to push-up from seated position, mildly unsteady  ALLERGIES: Allergies  Allergen Reactions  . Amoxicillin-Pot Clavulanate Itching    Ok with benadryl Did it involve swelling of the face/tongue/throat, SOB, or low BP? No Did it involve sudden or severe rash/hives, skin peeling, or any reaction on the inside of your mouth or nose? No Did you need to seek medical attention at a hospital or doctor's office? No When did it last happen?6 months If all above answers are "NO", may proceed with cephalosporin use.   . Erythromycin Rash    Ok with benadryl   . Moxifloxacin Itching    Reaction to Avelox - ok with benadryl  . Sulfamethoxazole-Trimethoprim Itching    Ok with benadryl    HOME MEDICATIONS: Current Outpatient Medications  Medication Sig Dispense Refill  . ascorbic acid (VITAMIN C) 500 MG tablet Take 1 tablet (500 mg total) by mouth daily.    . Budeson-Glycopyrrol-Formoterol (BREZTRI AEROSPHERE) 160-9-4.8 MCG/ACT AERO Inhale 2 puffs into the lungs in the morning and at bedtime.    . cetirizine (ZYRTEC) 10 MG tablet Take 10 mg daily by mouth.    . COLLAGEN PO Take 1,000  mg by mouth 3 (three) times daily.    Marland Kitchen dextromethorphan-guaiFENesin (MUCINEX DM) 30-600 MG 12hr tablet Take 1 tablet by mouth 2 (two) times daily.    . DULoxetine (CYMBALTA) 30 MG capsule Take 30 mg by mouth daily.    . fluticasone (FLONASE) 50 MCG/ACT nasal spray Place 1 spray into both nostrils daily as needed for allergies.    . furosemide (LASIX) 20 MG tablet Take 1 tablet (20 mg total) by mouth daily as needed for edema (weight gain of 3lbs in 1 day or 5 lbs in 2 days.). (Patient taking differently: Take 20 mg by mouth daily.) 30 tablet 0  . gabapentin (NEURONTIN) 400 MG capsule Take 400 mg by mouth 3 (three) times daily.    Marland Kitchen HYDROcodone-acetaminophen (NORCO) 10-325 MG tablet Take 1 tablet by mouth every 6 (six) hours as needed (breakthrough pain). (Patient taking differently: Take 1 tablet by mouth 3 (three) times daily.) 28 tablet 0  . losartan-hydrochlorothiazide (HYZAAR) 100-12.5 MG tablet Take 1 tablet by mouth daily.    . montelukast (SINGULAIR) 10 MG tablet Take 10 mg at bedtime by mouth.    . morphine (MS CONTIN) 30 MG 12 hr tablet Take 30 mg by mouth every 12 (twelve) hours.    Marland Kitchen nystatin-triamcinolone  ointment (MYCOLOG) Apply 1 application topically 2 (two) times daily as needed (rash).    . OXYGEN Inhale 4 L into the lungs continuous.     . pantoprazole (PROTONIX) 40 MG tablet Take 40 mg by mouth 2 (two) times daily.    . polyethylene glycol powder (GLYCOLAX/MIRALAX) 17 GM/SCOOP powder Take 17 g by mouth daily as needed for mild constipation.    . predniSONE (DELTASONE) 10 MG tablet Prednisone 30 mg daily for 3 days followed by  Prednisone 20 mg daily for 3 days followed by  Prendisone 10 mg daily for 3 days. 18 tablet 0  . senna-docusate (SENOKOT-S) 8.6-50 MG tablet Take 2 tablets by mouth 2 (two) times daily.    . traZODone (DESYREL) 50 MG tablet Take 1 tablet (50 mg total) by mouth at bedtime as needed for sleep. 20 tablet 0  . Vitamin D, Ergocalciferol, (DRISDOL) 1.25 MG  (50000 UNIT) CAPS capsule Take 50,000 Units by mouth every Monday.    . zinc sulfate 220 (50 Zn) MG capsule Take 1 capsule (220 mg total) by mouth daily.     No current facility-administered medications for this visit.    PAST MEDICAL HISTORY: Past Medical History:  Diagnosis Date  . Allergic rhinitis   . Asthma   . CHF (congestive heart failure) (HCC)   . Chronic pain   . COPD (chronic obstructive pulmonary disease) (HCC)    on home O2  . GERD (gastroesophageal reflux disease)   . Headache   . History of COVID-19   . Hypertension   . Neck pain   . Open wound of left hip    spouse changes dressing q day  . Shortness of breath   . Sleep apnea    NO DEVICE IN USE, USES SUPPLEMENTAL O2 IN PLACE OF DEVICE   . Supplemental oxygen dependent    4L CONTINUOUS     PAST SURGICAL HISTORY: Past Surgical History:  Procedure Laterality Date  . COLONOSCOPY    . HUMERUS IM NAIL Left 07/23/2017   Procedure: INTRAMEDULLARY (IM) NAIL HUMERAL;  Surgeon: Yolonda Kidaogers, Jason Patrick, MD;  [facility];  Service: Orthopedics;  Laterality: Left;  . INCISION AND DRAINAGE HIP Left 04/01/2019   Procedure: IRRIGATION AND DEBRIDEMENT HIP;  Surgeon: Samson FredericSwinteck, Brian, MD;  Location: [facility] ORS;  Service: Orthopedics;  Laterality: Left;  . LUMBAR LAMINECTOMY/DECOMPRESSION MICRODISCECTOMY  04/06/2012   Procedure: LUMBAR LAMINECTOMY/DECOMPRESSION MICRODISCECTOMY;  Surgeon: Kerrin ChampagneJames E Nitka, MD;  [facility];  Service: Orthopedics;  Laterality: N/A;  Left L3-4 and L4-5 lateral recess decompression MIS  . NECK SURGERY  Sept 2005  . TOTAL ABDOMINAL HYSTERECTOMY  Oct 2003   partial  . TOTAL HIP ARTHROPLASTY Left 02/18/2019   Procedure: TOTAL HIP ARTHROPLASTY ANTERIOR APPROACH;  Surgeon: Samson FredericSwinteck, Brian, MD;  Location: [facility] ORS;  Service: Orthopedics;  Laterality: Left;  . TUBAL LIGATION  1995    FAMILY HISTORY: Family History  Problem Relation Age of Onset  . Emphysema Father   . Rheum arthritis Father   . Lung  cancer Father   . Emphysema Mother     SOCIAL HISTORY: Social History   Socioeconomic History  . Marital status: Married    Spouse name: Not on file  . Number of children: 2  . Years of education: some college  . Highest education level: Not on file  Occupational History  . Occupation: house wife  Tobacco Use  . Smoking status: Current Every Day Smoker    Packs/day: 0.50  Years: 30.00    Pack years: 15.00    Types: Cigarettes  . Smokeless tobacco: Never Used  . Tobacco comment: currently smoking 1/2 ppd.   Vaping Use  . Vaping Use: Never used  Substance and Sexual Activity  . Alcohol use: Not Currently  . Drug use: No  . Sexual activity: Not Currently    Birth control/protection: None  Other Topics Concern  . Not on file  Social History Narrative   Pt lives in [place], with her husband.   Right-handed.   3-4 cup caffeine per day.   Social Determinants of Health   Financial Resource Strain: Not on file  Food Insecurity: Not on file  Transportation Needs: Not on file  Physical Activity: Not on file  Stress: Not on file  Social Connections: Not on file  Intimate Partner Violence: Not on file      Levert Feinstein, M.D. Ph.D.  [facility] [address], [address] [place], [place] [address] Ph: [phone] Fax: [phone]  CC:  Ardell Isaacs, MD [address] [place],  [place] [address]  [name], MD

## 2020-12-27 ENCOUNTER — Ambulatory Visit
Admission: RE | Admit: 2020-12-27 | Discharge: 2020-12-27 | Disposition: A | Discharge status: Home / Self Care | Payer: Medicare Other | Source: Ambulatory Visit | Attending: Neurology | Admitting: Neurology

## 2020-12-27 ENCOUNTER — Other Ambulatory Visit: Payer: Self-pay

## 2020-12-27 ENCOUNTER — Other Ambulatory Visit: Payer: Self-pay | Admitting: Neurology

## 2020-12-27 DIAGNOSIS — R519 Headache, unspecified: Secondary | ICD-10-CM

## 2020-12-28 ENCOUNTER — Telehealth: Payer: Self-pay | Admitting: Neurology

## 2020-12-28 NOTE — Telephone Encounter (Signed)
I called the patient and provided her with the MRI brain results. We had a long conversation about the recommendations to help prevent future strokes. She was receptive to the plan. She will make an appt with her PCP to help with these lifestyle changes (smoking cessation, diet) and overall management of her medication conditions. She will also start aspirin 81mg  daily.

## 2020-12-28 NOTE — Telephone Encounter (Signed)
Nothing seen in the brain to explain her new symptoms. Patient has white matter changes in the brain/chronic microvascular ischemia. 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 She has to treat her OSA, stop smoking, watch her weight and blood pressure and diabetes and her general health or she is at risk for further strokes. Cannot tell her when the stroke happened but doesn't appear to be on her MRI in 2007. I would start ASA 81 daily unless specifically contraindicated for any reason. I will put this in a phone note too.   MRI brain wo 12/28/2020:  IMPRESSION: Abbreviated examination as the patient felt like she could not tolerate the study. Mild chronic small-vessel ischemic changes of the cerebral hemispheric white matter. 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

## 2021-01-17 IMAGING — DX DG CHEST 1V PORT
1 series · 1 of 1 positions shown · non-contrast
Comparison: 07/25/2020

CLINICAL DATA: Dyspnea

EXAM:
PORTABLE CHEST 1 VIEW

[chest ap]
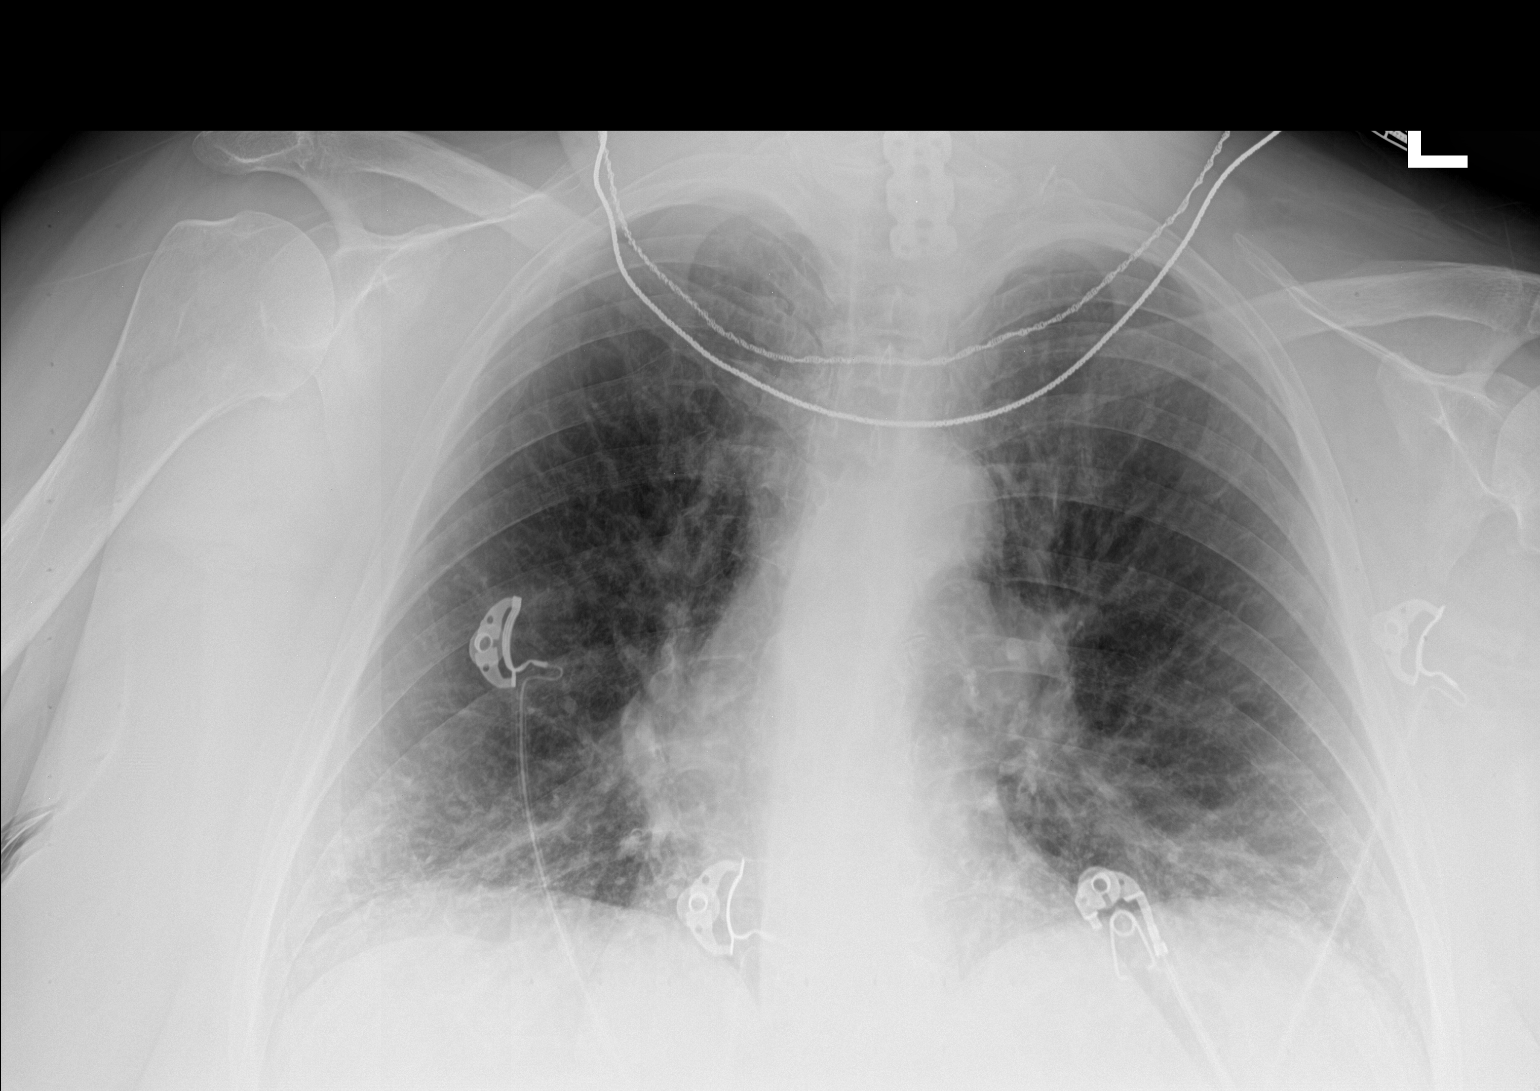

[1 of 1 positions shown; findings below may reference images not displayed]

FINDINGS: Mild bibasilar pulmonary fibrotic change again noted. Lung volumes
are small, but are symmetric and pulmonary insufflation appears
slightly improved when compared to prior examination. No confluent
pulmonary infiltrate. No pneumothorax or pleural effusion. Cardiac
size within normal limits. Pulmonary vascularity is normal. No acute
bone abnormality.
IMPRESSION: Stable mild bibasilar pulmonary fibrotic change. No radiographic
evidence of acute cardiopulmonary disease.

## 2021-02-16 ENCOUNTER — Inpatient Hospital Stay (HOSPITAL_COMMUNITY)
Admission: EM | Admit: 2021-02-16 | Discharge: 2021-02-19 | DRG: 189 | Disposition: A | Discharge status: Home Health | Payer: Medicare Other | Attending: Internal Medicine | Admitting: Internal Medicine

## 2021-02-16 ENCOUNTER — Encounter (HOSPITAL_COMMUNITY): Payer: Self-pay

## 2021-02-16 ENCOUNTER — Emergency Department (HOSPITAL_COMMUNITY): Payer: Medicare Other

## 2021-02-16 ENCOUNTER — Other Ambulatory Visit: Payer: Self-pay

## 2021-02-16 DIAGNOSIS — I350 Nonrheumatic aortic (valve) stenosis: Secondary | ICD-10-CM

## 2021-02-16 DIAGNOSIS — Z9981 Dependence on supplemental oxygen: Secondary | ICD-10-CM

## 2021-02-16 DIAGNOSIS — Z9119 Patient's noncompliance with other medical treatment and regimen: Secondary | ICD-10-CM | POA: Diagnosis not present

## 2021-02-16 DIAGNOSIS — G894 Chronic pain syndrome: Secondary | ICD-10-CM | POA: Diagnosis present

## 2021-02-16 DIAGNOSIS — J9622 Acute and chronic respiratory failure with hypercapnia: Secondary | ICD-10-CM | POA: Diagnosis present

## 2021-02-16 DIAGNOSIS — F32A Depression, unspecified: Secondary | ICD-10-CM | POA: Diagnosis present

## 2021-02-16 DIAGNOSIS — I110 Hypertensive heart disease with heart failure: Secondary | ICD-10-CM | POA: Diagnosis present

## 2021-02-16 DIAGNOSIS — Z8261 Family history of arthritis: Secondary | ICD-10-CM

## 2021-02-16 DIAGNOSIS — F17210 Nicotine dependence, cigarettes, uncomplicated: Secondary | ICD-10-CM | POA: Diagnosis present

## 2021-02-16 DIAGNOSIS — Z6830 Body mass index (BMI) 30.0-30.9, adult: Secondary | ICD-10-CM | POA: Diagnosis not present

## 2021-02-16 DIAGNOSIS — Z20822 Contact with and (suspected) exposure to covid-19: Secondary | ICD-10-CM | POA: Diagnosis present

## 2021-02-16 DIAGNOSIS — Z825 Family history of asthma and other chronic lower respiratory diseases: Secondary | ICD-10-CM

## 2021-02-16 DIAGNOSIS — D72829 Elevated white blood cell count, unspecified: Secondary | ICD-10-CM | POA: Diagnosis present

## 2021-02-16 DIAGNOSIS — I509 Heart failure, unspecified: Secondary | ICD-10-CM | POA: Diagnosis present

## 2021-02-16 DIAGNOSIS — Z79899 Other long term (current) drug therapy: Secondary | ICD-10-CM | POA: Diagnosis not present

## 2021-02-16 DIAGNOSIS — J9621 Acute and chronic respiratory failure with hypoxia: Secondary | ICD-10-CM | POA: Diagnosis present

## 2021-02-16 DIAGNOSIS — F419 Anxiety disorder, unspecified: Secondary | ICD-10-CM | POA: Diagnosis present

## 2021-02-16 DIAGNOSIS — Z882 Allergy status to sulfonamides status: Secondary | ICD-10-CM

## 2021-02-16 DIAGNOSIS — R5381 Other malaise: Secondary | ICD-10-CM | POA: Diagnosis present

## 2021-02-16 DIAGNOSIS — Z8616 Personal history of COVID-19: Secondary | ICD-10-CM

## 2021-02-16 DIAGNOSIS — I10 Essential (primary) hypertension: Secondary | ICD-10-CM | POA: Diagnosis present

## 2021-02-16 DIAGNOSIS — G4730 Sleep apnea, unspecified: Secondary | ICD-10-CM | POA: Diagnosis present

## 2021-02-16 DIAGNOSIS — G9341 Metabolic encephalopathy: Secondary | ICD-10-CM | POA: Diagnosis present

## 2021-02-16 DIAGNOSIS — J9601 Acute respiratory failure with hypoxia: Secondary | ICD-10-CM

## 2021-02-16 DIAGNOSIS — Z96642 Presence of left artificial hip joint: Secondary | ICD-10-CM | POA: Diagnosis present

## 2021-02-16 DIAGNOSIS — Z7982 Long term (current) use of aspirin: Secondary | ICD-10-CM | POA: Diagnosis not present

## 2021-02-16 DIAGNOSIS — E785 Hyperlipidemia, unspecified: Secondary | ICD-10-CM | POA: Diagnosis present

## 2021-02-16 DIAGNOSIS — E662 Morbid (severe) obesity with alveolar hypoventilation: Secondary | ICD-10-CM | POA: Diagnosis present

## 2021-02-16 DIAGNOSIS — G4733 Obstructive sleep apnea (adult) (pediatric): Secondary | ICD-10-CM | POA: Diagnosis present

## 2021-02-16 DIAGNOSIS — Z880 Allergy status to penicillin: Secondary | ICD-10-CM

## 2021-02-16 DIAGNOSIS — K219 Gastro-esophageal reflux disease without esophagitis: Secondary | ICD-10-CM | POA: Diagnosis present

## 2021-02-16 DIAGNOSIS — J441 Chronic obstructive pulmonary disease with (acute) exacerbation: Secondary | ICD-10-CM | POA: Diagnosis present

## 2021-02-16 DIAGNOSIS — Z801 Family history of malignant neoplasm of trachea, bronchus and lung: Secondary | ICD-10-CM

## 2021-02-16 DIAGNOSIS — Z7951 Long term (current) use of inhaled steroids: Secondary | ICD-10-CM

## 2021-02-16 LAB — COMPREHENSIVE METABOLIC PANEL
ALT: 15 U/L (ref 0–44)
AST: 12 U/L — ABNORMAL LOW (ref 15–41)
Albumin: 3.5 g/dL (ref 3.5–5.0)
Alkaline Phosphatase: 63 U/L (ref 38–126)
Anion gap: 9 (ref 5–15)
BUN: 5 mg/dL — ABNORMAL LOW (ref 6–20)
CO2: 41 mmol/L — ABNORMAL HIGH (ref 22–32)
Calcium: 9 mg/dL (ref 8.9–10.3)
Chloride: 93 mmol/L — ABNORMAL LOW (ref 98–111)
Creatinine, Ser: 0.53 mg/dL (ref 0.44–1.00)
GFR, Estimated: >60 mL/min (ref >60)
Glucose, Bld: 123 mg/dL — ABNORMAL HIGH (ref 70–99)
Potassium: 3.5 mmol/L (ref 3.5–5.1)
Sodium: 143 mmol/L (ref 135–145)
Total Bilirubin: 0.6 mg/dL (ref 0.3–1.2)
Total Protein: 6.2 g/dL — ABNORMAL LOW (ref 6.5–8.1)

## 2021-02-16 LAB — I-STAT BETA HCG BLOOD, ED (MC, WL, AP ONLY): I-stat hCG, quantitative: 21.1 m[IU]/mL — ABNORMAL HIGH (ref <5)

## 2021-02-16 LAB — I-STAT VENOUS BLOOD GAS, ED
Acid-Base Excess: 14 mmol/L — ABNORMAL HIGH (ref 0.0–2.0)
Acid-Base Excess: 15 mmol/L — ABNORMAL HIGH (ref 0.0–2.0)
Bicarbonate: 44.3 mmol/L — ABNORMAL HIGH (ref 20.0–28.0)
Bicarbonate: 44.3 mmol/L — ABNORMAL HIGH (ref 20.0–28.0)
Calcium, Ion: 1.11 mmol/L — ABNORMAL LOW (ref 1.15–1.40)
Calcium, Ion: 1.09 mmol/L — ABNORMAL LOW (ref 1.15–1.40)
HCT: 35 % — ABNORMAL LOW (ref 36.0–46.0)
HCT: 36 % (ref 36.0–46.0)
Hemoglobin: 11.9 g/dL — ABNORMAL LOW (ref 12.0–15.0)
Hemoglobin: 12.2 g/dL (ref 12.0–15.0)
O2 Saturation: 50 %
O2 Saturation: 91 %
Potassium: 3.3 mmol/L — ABNORMAL LOW (ref 3.5–5.1)
Potassium: 3.7 mmol/L (ref 3.5–5.1)
Sodium: 140 mmol/L (ref 135–145)
Sodium: 140 mmol/L (ref 135–145)
TCO2: 47 mmol/L — ABNORMAL HIGH (ref 22–32)
TCO2: 47 mmol/L — ABNORMAL HIGH (ref 22–32)
pCO2, Ven: 87.7 mmHg (ref 44.0–60.0)
pCO2, Ven: 79.3 mmHg (ref 44.0–60.0)
pH, Ven: 7.312 (ref 7.250–7.430)
pH, Ven: 7.355 (ref 7.250–7.430)
pO2, Ven: 31 mmHg — CL (ref 32.0–45.0)
pO2, Ven: 69 mmHg — ABNORMAL HIGH (ref 32.0–45.0)

## 2021-02-16 LAB — TROPONIN I (HIGH SENSITIVITY)
Troponin I (High Sensitivity): 23 ng/L — ABNORMAL HIGH (ref <18)
Troponin I (High Sensitivity): 21 ng/L — ABNORMAL HIGH (ref <18)

## 2021-02-16 LAB — CBC WITH DIFFERENTIAL/PLATELET
Abs Immature Granulocytes: 0 10*3/uL (ref 0.00–0.07)
Basophils Absolute: 0 10*3/uL (ref 0.0–0.1)
Basophils Relative: 0 %
Eosinophils Absolute: 0 10*3/uL (ref 0.0–0.5)
Eosinophils Relative: 0 %
HCT: 39.6 % (ref 36.0–46.0)
Hemoglobin: 11.3 g/dL — ABNORMAL LOW (ref 12.0–15.0)
Lymphocytes Relative: 2 %
Lymphs Abs: 0.6 10*3/uL — ABNORMAL LOW (ref 0.7–4.0)
MCH: 25.9 pg — ABNORMAL LOW (ref 26.0–34.0)
MCHC: 28.5 g/dL — ABNORMAL LOW (ref 30.0–36.0)
MCV: 90.6 fL (ref 80.0–100.0)
Monocytes Absolute: 1 10*3/uL (ref 0.1–1.0)
Monocytes Relative: 3 %
Neutro Abs: 30.5 10*3/uL — ABNORMAL HIGH (ref 1.7–7.7)
Neutrophils Relative %: 95 %
Platelets: 293 10*3/uL (ref 150–400)
RBC: 4.37 MIL/uL (ref 3.87–5.11)
RDW: 16.7 % — ABNORMAL HIGH (ref 11.5–15.5)
WBC: 32.1 10*3/uL — ABNORMAL HIGH (ref 4.0–10.5)
nRBC: 0 % (ref 0.0–0.2)
nRBC: 0 /100 WBC

## 2021-02-16 LAB — RESP PANEL BY RT-PCR (FLU A&B, COVID) ARPGX2
Influenza A by PCR: NEGATIVE
Influenza B by PCR: NEGATIVE
SARS Coronavirus 2 by RT PCR: NEGATIVE

## 2021-02-16 LAB — BRAIN NATRIURETIC PEPTIDE: B Natriuretic Peptide: 86.7 pg/mL (ref 0.0–100.0)

## 2021-02-16 LAB — LACTIC ACID, PLASMA: Lactic Acid, Venous: 1.1 mmol/L (ref 0.5–1.9)

## 2021-02-16 MED ORDER — FLUTICASONE FUROATE-VILANTEROL 100-25 MCG/INH IN AEPB
1.0000 | INHALATION_SPRAY | Freq: Two times a day (BID) | RESPIRATORY_TRACT | Status: DC
  Filled 2021-02-16: qty 28

## 2021-02-16 MED ORDER — ENOXAPARIN SODIUM 40 MG/0.4ML IJ SOSY
40.0000 mg | PREFILLED_SYRINGE | INTRAMUSCULAR | Status: DC
  Administered 2021-02-17 – 2021-02-19 (×3): 40 mg via SUBCUTANEOUS
  Filled 2021-02-16 (×3): qty 0.4

## 2021-02-16 MED ORDER — ALBUTEROL SULFATE (2.5 MG/3ML) 0.083% IN NEBU
2.5000 mg | INHALATION_SOLUTION | RESPIRATORY_TRACT | Status: DC | PRN
  Administered 2021-02-18 (×2): 2.5 mg via RESPIRATORY_TRACT
  Filled 2021-02-16 (×2): qty 3

## 2021-02-16 MED ORDER — ONDANSETRON HCL 4 MG/2ML IJ SOLN: 4.0000 mg | Freq: Four times a day (QID) | INTRAMUSCULAR | Status: DC | PRN

## 2021-02-16 MED ORDER — ALBUTEROL (5 MG/ML) CONTINUOUS INHALATION SOLN
15.0000 mg/h | INHALATION_SOLUTION | Freq: Once | RESPIRATORY_TRACT | Status: DC
  Filled 2021-02-16: qty 20

## 2021-02-16 MED ORDER — BUDESON-GLYCOPYRROL-FORMOTEROL 160-9-4.8 MCG/ACT IN AERO: 2.0000 | INHALATION_SPRAY | Freq: Two times a day (BID) | RESPIRATORY_TRACT | Status: DC

## 2021-02-16 MED ORDER — IPRATROPIUM-ALBUTEROL 0.5-2.5 (3) MG/3ML IN SOLN
3.0000 mL | Freq: Four times a day (QID) | RESPIRATORY_TRACT | Status: DC
  Administered 2021-02-16 – 2021-02-17 (×4): 3 mL via RESPIRATORY_TRACT
  Filled 2021-02-16 (×4): qty 3

## 2021-02-16 MED ORDER — UMECLIDINIUM BROMIDE 62.5 MCG/INH IN AEPB
1.0000 | INHALATION_SPRAY | Freq: Every day | RESPIRATORY_TRACT | Status: DC
  Administered 2021-02-17 – 2021-02-19 (×3): 1 via RESPIRATORY_TRACT
  Filled 2021-02-16: qty 7

## 2021-02-16 MED ORDER — FLUTICASONE FUROATE-VILANTEROL 100-25 MCG/INH IN AEPB
1.0000 | INHALATION_SPRAY | Freq: Every day | RESPIRATORY_TRACT | Status: DC
  Administered 2021-02-17 – 2021-02-19 (×3): 1 via RESPIRATORY_TRACT
  Filled 2021-02-16: qty 28

## 2021-02-16 MED ORDER — UMECLIDINIUM BROMIDE 62.5 MCG/INH IN AEPB
1.0000 | INHALATION_SPRAY | Freq: Two times a day (BID) | RESPIRATORY_TRACT | Status: DC
  Filled 2021-02-16: qty 7

## 2021-02-16 MED ORDER — METHYLPREDNISOLONE SODIUM SUCC 40 MG IJ SOLR
40.0000 mg | Freq: Two times a day (BID) | INTRAMUSCULAR | Status: AC
  Administered 2021-02-17 – 2021-02-18 (×4): 40 mg via INTRAVENOUS
  Filled 2021-02-16 (×4): qty 1

## 2021-02-16 MED ORDER — ONDANSETRON HCL 4 MG PO TABS: 4.0000 mg | ORAL_TABLET | Freq: Four times a day (QID) | ORAL | Status: DC | PRN

## 2021-02-16 MED ORDER — IPRATROPIUM BROMIDE 0.02 % IN SOLN
0.5000 mg | Freq: Once | RESPIRATORY_TRACT | Status: DC
  Filled 2021-02-16: qty 2.5

## 2021-02-16 MED ORDER — ACETAMINOPHEN 325 MG PO TABS
650.0000 mg | ORAL_TABLET | Freq: Four times a day (QID) | ORAL | Status: DC | PRN
  Administered 2021-02-17 – 2021-02-18 (×4): 650 mg via ORAL
  Filled 2021-02-16 (×4): qty 2

## 2021-02-16 MED ORDER — ACETAMINOPHEN 650 MG RE SUPP: 650.0000 mg | Freq: Four times a day (QID) | RECTAL | Status: DC | PRN

## 2021-02-16 MED ORDER — SODIUM CHLORIDE 0.9% FLUSH
3.0000 mL | Freq: Two times a day (BID) | INTRAVENOUS | Status: DC
  Administered 2021-02-16 – 2021-02-18 (×5): 3 mL via INTRAVENOUS

## 2021-02-16 NOTE — ED Notes (Signed)
Pt now responding to painful stimuli instead of voice. Notified Plunkett MD

## 2021-02-16 NOTE — ED Provider Notes (Signed)
[name] Provider Note   CSN: [id number] Arrival date & time: 02/16/21  1701     History Chief Complaint  Patient presents with   Respiratory Distress    Nichole Georgia DuffS Morrison is a 55 y.o. female.  Pt is a 54y/o female with severe aortic stenosis currently being considered for TAVR with EF of 65-70%, severe COPD followed at [facility] with prn O2 per the patient, HTN, HLD and chronic pain syndrome presenting today with EMS for SOB.  Patient reports she really became short of breath today.  She denied any productive cough or vomiting.  Due to patient's respiratory distress she is not able to give much further history.  Husband did report that she had COVID a few months ago that she recovered from.  Patient denies any chest pain or abdominal pain.  Patient's legs are wrapped and husband reports that it is related to a heart issue that they are working on.  The history is provided by the patient, the spouse and medical records. The history is limited by the condition of the patient.       Past Medical History:  Diagnosis Date   Allergic rhinitis    Asthma    CHF (congestive heart failure) (HCC)    Chronic pain    COPD (chronic obstructive pulmonary disease) (HCC)    on home O2   GERD (gastroesophageal reflux disease)    Headache    History of COVID-19    Hypertension    Neck pain    Open wound of left hip    spouse changes dressing q day   Shortness of breath    Sleep apnea    NO DEVICE IN USE, USES SUPPLEMENTAL O2 IN PLACE OF DEVICE    Supplemental oxygen dependent    4L CONTINUOUS     Patient Active Problem List   Diagnosis Date Noted   New onset headache 12/14/2020   DNR (do not resuscitate) 11/13/2020   Acute respiratory failure (HCC)    Respiratory distress    COVID-19 11/11/2020   Aortic stenosis 09/07/2020   Acute on chronic heart failure with preserved ejection fraction (HFpEF) (HCC) 09/07/2020   Osteoarthritis of left hip  02/18/2019   Chronic respiratory failure with hypoxia (HCC) 05/20/2018   Chronic pain 05/20/2018   Tobacco abuse 05/20/2018   Essential hypertension    GERD (gastroesophageal reflux disease)    COPD (chronic obstructive pulmonary disease) (HCC)    Asthma    Obesity due to excess calories    Lumbar spinal stenosis 04/03/2012    Class: Diagnosis of   COPD exacerbation (HCC) 05/31/2010    Past Surgical History:  Procedure Laterality Date   COLONOSCOPY     HUMERUS IM NAIL Left 07/23/2017   Procedure: INTRAMEDULLARY (IM) NAIL HUMERAL;  Surgeon: Yolonda Kidaogers, Jason Patrick, MD;  Location: [facility] OR;  Service: Orthopedics;  Laterality: Left;   INCISION AND DRAINAGE HIP Left 04/01/2019   Procedure: IRRIGATION AND DEBRIDEMENT HIP;  Surgeon: Samson FredericSwinteck, Brian, MD;  Location: [facility] ORS;  Service: Orthopedics;  Laterality: Left;   LUMBAR LAMINECTOMY/DECOMPRESSION MICRODISCECTOMY  04/06/2012   Procedure: LUMBAR LAMINECTOMY/DECOMPRESSION MICRODISCECTOMY;  Surgeon: Kerrin ChampagneJames E Nitka, MD;  Location: [facility] OR;  Service: Orthopedics;  Laterality: N/A;  Left L3-4 and L4-5 lateral recess decompression MIS   NECK SURGERY  Sept 2005   TOTAL ABDOMINAL HYSTERECTOMY  Oct 2003   partial   TOTAL HIP ARTHROPLASTY Left 02/18/2019   Procedure: TOTAL HIP ARTHROPLASTY ANTERIOR APPROACH;  Surgeon:  Samson Frederic, MD;  Location: [facility] ORS;  Service: Orthopedics;  Laterality: Left;   TUBAL LIGATION  1995     OB History   No obstetric history on file.     Family History  Problem Relation Age of Onset   Emphysema Father    Rheum arthritis Father    Lung cancer Father    Emphysema Mother     Social History   Tobacco Use   Smoking status: Every Day    Packs/day: 0.50    Years: 30.00    Pack years: 15.00    Types: Cigarettes   Smokeless tobacco: Never   Tobacco comments:    currently smoking 1/2 ppd.   Vaping Use   Vaping Use: Never used  Substance Use Topics   Alcohol use: Not Currently   Drug use: No    Home  Medications Prior to Admission medications   Medication Sig Start Date End Date Taking? Authorizing Provider  ascorbic acid (VITAMIN C) 500 MG tablet Take 1 tablet (500 mg total) by mouth daily. 11/21/20   Kathlen Mody, MD  aspirin EC 81 MG tablet Take 81 mg by mouth daily. Swallow whole.    [provider]  Budeson-Glycopyrrol-Formoterol (BREZTRI AEROSPHERE) 160-9-4.8 MCG/ACT AERO Inhale 2 puffs into the lungs in the morning and at bedtime.    [provider]  cetirizine (ZYRTEC) 10 MG tablet Take 10 mg daily by mouth.    [provider]  COLLAGEN PO Take 1,000 mg by mouth 3 (three) times daily.    [provider]  dextromethorphan-guaiFENesin (MUCINEX DM) 30-600 MG 12hr tablet Take 1 tablet by mouth 2 (two) times daily.    [provider]  DULoxetine (CYMBALTA) 30 MG capsule Take 30 mg by mouth daily. 07/12/20   [provider]  fluticasone (FLONASE) 50 MCG/ACT nasal spray Place 1 spray into both nostrils daily as needed for allergies.    [provider]  furosemide (LASIX) 20 MG tablet Take 1 tablet (20 mg total) by mouth daily as needed for edema (weight gain of 3lbs in 1 day or 5 lbs in 2 days.). Patient taking differently: Take 20 mg by mouth daily. 07/27/20   Rolly Salter, MD  gabapentin (NEURONTIN) 400 MG capsule Take 400 mg by mouth 3 (three) times daily. 08/09/20   [provider]  HYDROcodone-acetaminophen (NORCO) 10-325 MG tablet Take 1 tablet by mouth every 6 (six) hours as needed (breakthrough pain). Patient taking differently: Take 1 tablet by mouth 3 (three) times daily. 04/02/19   Swinteck, Arlys John, MD  losartan-hydrochlorothiazide (HYZAAR) 100-12.5 MG tablet Take 1 tablet by mouth daily. 06/15/20   [provider]  montelukast (SINGULAIR) 10 MG tablet Take 10 mg at bedtime by mouth.    [provider]  morphine (MS CONTIN) 30 MG 12 hr tablet Take 30 mg by mouth every 12 (twelve) hours.     [provider]  nystatin-triamcinolone ointment (MYCOLOG) Apply 1 application topically 2 (two) times daily as needed (rash). 10/16/20   [provider]  OXYGEN Inhale 4 L into the lungs continuous.     [provider]  pantoprazole (PROTONIX) 40 MG tablet Take 40 mg by mouth 2 (two) times daily. 08/16/20   [provider]  polyethylene glycol powder (GLYCOLAX/MIRALAX) 17 GM/SCOOP powder Take 17 g by mouth daily as needed for mild constipation. 09/04/20   [provider]  predniSONE (DELTASONE) 10 MG tablet Prednisone 30 mg daily for 3 days followed by  Prednisone 20 mg daily for 3 days followed by  Prendisone 10 mg daily for 3 days. 11/20/20   Kathlen Mody, MD  senna-docusate (SENOKOT-S) 8.6-50 MG tablet Take 2 tablets by mouth 2 (two) times daily. 11/20/20   Kathlen Mody, MD  traZODone (DESYREL) 50 MG tablet Take 1 tablet (50 mg total) by mouth at bedtime as needed for sleep. 11/20/20   Kathlen Mody, MD  Vitamin D, Ergocalciferol, (DRISDOL) 1.25 MG (50000 UNIT) CAPS capsule Take 50,000 Units by mouth every Monday. 06/15/20   [provider]  zinc sulfate 220 (50 Zn) MG capsule Take 1 capsule (220 mg total) by mouth daily. 11/21/20   Kathlen Mody, MD    Allergies    Amoxicillin-pot clavulanate, Erythromycin, Moxifloxacin, and Sulfamethoxazole-trimethoprim  Review of Systems   Review of Systems  Unable to perform ROS: Severe respiratory distress   Physical Exam Updated Vital Signs BP 120/74   Pulse 86   Temp 98.5 F (36.9 C) (Axillary)   Resp 18   Ht  (1.6 m)   Wt 82.6 kg   SpO2 91%   BMI 32.26 kg/m   Physical Exam Vitals and nursing note reviewed.  Constitutional:      General: She is in acute distress.     Appearance: She is well-developed. She is obese. She is ill-appearing and diaphoretic.  HENT:     Head: Normocephalic and atraumatic.     Mouth/Throat:     Mouth: Mucous membranes are dry.  Eyes:     Pupils:  Pupils are equal, round, and reactive to light.  Cardiovascular:     Rate and Rhythm: Regular rhythm. Tachycardia present.     Heart sounds: Normal heart sounds. No murmur heard.   No friction rub.  Pulmonary:     Effort: Tachypnea, accessory muscle usage, prolonged expiration and respiratory distress present.     Breath sounds: Rales present. No wheezing.  Abdominal:     General: Bowel sounds are normal. There is no distension.     Palpations: Abdomen is soft.     Tenderness: There is no abdominal tenderness. There is no guarding or rebound.  Musculoskeletal:        General: No tenderness. Normal range of motion.     Cervical back: Normal range of motion and neck supple.     Right lower leg: Edema present.     Left lower leg: Edema present.     Comments: Lower legs are wrapped and 2+ pitting edema noted to just below the knee bilaterally.  Feet are cool to the touch and slightly cyanotic but 1+ DP pulse palpated bilaterally  Skin:    General: Skin is warm.     Findings: No rash.  Neurological:     Mental Status: She is alert.     Cranial Nerves: No cranial nerve deficit.     Comments: Patient will open her eyes and answer questions and then immediately closes them again.  She is lethargic.  She is noted to move her arms and legs.  Psychiatric:     Comments: Resp distress    ED Results / Procedures / Treatments   Labs (all labs ordered are listed, but only abnormal results are displayed) Labs Reviewed  CBC WITH DIFFERENTIAL/PLATELET - Abnormal; Notable for the following components:      Result Value   WBC 32.1 (*)    Hemoglobin 11.3 (*)    MCH 25.9 (*)    MCHC 28.5 (*)    RDW 16.7 (*)  Neutro Abs 30.5 (*)    Lymphs Abs 0.6 (*)    All other components within normal limits  COMPREHENSIVE METABOLIC PANEL - Abnormal; Notable for the following components:   Chloride 93 (*)    CO2 41 (*)    Glucose, Bld 123 (*)    BUN 5 (*)    Total Protein 6.2 (*)    AST 12 (*)    All  other components within normal limits  I-STAT VENOUS BLOOD GAS, ED - Abnormal; Notable for the following components:   pCO2, Ven 87.7 (*)    pO2, Ven 31.0 (*)    Bicarbonate 44.3 (*)    TCO2 47 (*)    Acid-Base Excess 14.0 (*)    Potassium 3.3 (*)    Calcium, Ion 1.11 (*)    HCT 35.0 (*)    Hemoglobin 11.9 (*)    All other components within normal limits  I-STAT BETA HCG BLOOD, ED ([facility], [facility], [facility] ONLY) - Abnormal; Notable for the following components:   I-stat hCG, quantitative 21.1 (*)    All other components within normal limits  TROPONIN I (HIGH SENSITIVITY) - Abnormal; Notable for the following components:   Troponin I (High Sensitivity) 23 (*)    All other components within normal limits  RESP PANEL BY RT-PCR (FLU A&B, COVID) ARPGX2  LACTIC ACID, PLASMA  BRAIN NATRIURETIC PEPTIDE  I-STAT VENOUS BLOOD GAS, ED  TROPONIN I (HIGH SENSITIVITY)    EKG EKG Interpretation  Date/Time:  Friday February 16 2021 17:12:04 EDT Ventricular Rate:  97 PR Interval:  117 QRS Duration: 91 QT Interval:  347 QTC Calculation: 441 R Axis:   69 Text Interpretation: Sinus rhythm Borderline short PR interval No significant change since last tracing Confirmed by Gwyneth Sprout ([id number]) on 02/16/2021 5:32:02 PM  Radiology DG Chest Port 1 View  Result Date: 02/16/2021 CLINICAL DATA:  Respiratory distress. EXAM: PORTABLE CHEST 1 VIEW COMPARISON:  Chest x-ray dated November 12, 2020. FINDINGS: The heart size and mediastinal contours are within normal limits. Emphysematous changes again noted. Chronically coarsened interstitial markings, worse at the lung bases. No focal consolidation, pleural effusion, or pneumothorax. No acute osseous abnormality. IMPRESSION: 1. COPD. No active disease. Electronically Signed   By: Obie Dredge M.D.   On: 02/16/2021 18:28    Procedures Procedures   Medications Ordered in ED Medications - No data to display  ED Course  I have reviewed the triage vital signs and the  nursing notes.  Pertinent labs & imaging results that were available during my care of the patient were reviewed by me and considered in my medical decision making (see chart for details).    MDM Rules/Calculators/A&P                          55 year old female presenting today with respiratory distress.  Patient reports symptoms started today.  Patient has a history of severe aortic stenosis that has not had intervention yet with nonocclusive coronary arteries based on catheterization last month and EF of 65 to 70%. She also has a history of COPD and based on hospital records is supposed to be on 4 L of oxygen all the time but based on husband's report she wears it as needed.  She also does not wear her BiPAP at night because it is uncomfortable.  Patient denied any productive cough and there was no report of fever.  Patient did have COVID in March of this year.  Concern based on patient's exam for COPD exacerbation with hypercarbia, fluid overload.  Lower suspicion for ACS at this time.  Also lower suspicion for PE.  Also high suspicion for pneumonia.  Patient was placed on BiPAP due to work of breathing.  She was responsive prior to BiPAP being placed and once BiPAP was on her work of breathing had improved but she became very somnolent.  We will check a VBG adjust BiPAP settings patient may require intubation if mental status does not improve.  Labs and imaging are pending.  6:34 PM Pt now comfortable on bipap and will respond to loud voice.  CXR wnl.  Pt already has had 125 of Solu-Medrol, 7.5 of albuterol, 0.5 of Atrovent and 2 g of mag.  We will continue BiPAP at this time.  EKG without acute findings.  VBG is pending.  7:53 PM VBG with a normal pH and a CO2 of 87.  When looking back at prior labs baseline CO2 is in the 60s.  Patient is compensated with a bicarb of 43.  CBC with leukocytosis of 32,000, stable hemoglobin, creatinine is normal at 0.53 and LFTs are within normal limits.  BNP is  within normal limits.  Troponin is at baseline at 23 and lactic acid is normal at 1.1.  On repeat evaluation patient will still open her eyes to voice.  Will repeat VBG now that she has been on BiPAP for 2 hours.  She is now wheezing diffusely and she was started on an hour-long continuous neb as well as Atrovent.  Will admit for further care.  MDM   Amount and/or Complexity of Data Reviewed Clinical lab tests: ordered and reviewed Tests in the radiology section of CPT: ordered and reviewed Tests in the medicine section of CPT: ordered and reviewed Decide to obtain previous medical records or to obtain history from someone other than the patient: yes Obtain history from someone other than the patient: yes Review and summarize past medical records: yes Discuss the patient with other providers: yes Independent visualization of images, tracings, or specimens: yes  Risk of Complications, Morbidity, and/or Mortality Presenting problems: high Diagnostic procedures: high Management options: high  Patient Progress Patient progress: improved   CRITICAL CARE Performed by: Fayette Gasner Total critical care time: 45 minutes Critical care time was exclusive of separately billable procedures and treating other patients. Critical care was necessary to treat or prevent imminent or life-threatening deterioration. Critical care was time spent personally by me on the following activities: development of treatment plan with patient and/or surrogate as well as nursing, discussions with consultants, evaluation of patient's response to treatment, examination of patient, obtaining history from patient or surrogate, ordering and performing treatments and interventions, ordering and review of laboratory studies, ordering and review of radiographic studies, pulse oximetry and re-evaluation of patient's condition.  Final Clinical Impression(s) / ED Diagnoses Final diagnoses:  COPD exacerbation (HCC)  Acute  respiratory failure with hypoxia and hypercapnia [facility])    Rx / DC Orders ED Discharge Orders     None        Gwyneth Sprout, MD 02/16/21 1956

## 2021-02-16 NOTE — ED Triage Notes (Signed)
Pt arrived to ED via EMS from home as respiratory distress. Pt onset of shob today. Per husband pt had covid 2 months ago. EMS reports initial sats in the low 80's. Pt placed on NRB at 12L. EMS started 22g L hand and gave 125mg  solumedrol, 2g of Mag, a total of 7.5mg  albuterol and 0.5mg  atrovent. Pt A&Ox4 per EMS. Upon EMS arrival pt increased work of breathing, accessory muscle use, appears lethargic but easily arousable by voice. Pt hx of COPD.

## 2021-02-16 NOTE — H&P (Signed)
History and Physical    [name] WJX:[id number] DOB: 03-15-1966 DOA: 02/16/2021  PCP: [name], Michael C, MD  Patient coming from: Home via EMS  I have personally briefly reviewed patient's old medical records in [facility]  Chief Complaint: Respiratory distress, excessive somnolence  HPI: Nichole RanchDrinda S Morrison is a 55 y.o. female with medical history significant for severe COPD, chronic hypoxic and hypercapnic respiratory failure on 4 L upon O2 via [place] at all times, OSA nonadherent to BiPAP, severe AS and consideration for TAVR, hypertension, hyperlipidemia, mild nonobstructive CAD, GERD, depression/anxiety, and chronic pain who was brought to the ED for evaluation of respiratory distress.  History is limited from patient due to excessive somnolence and is otherwise obtained from EDP, chart review, and husband at bedside.  Patient had sudden onset of severe shortness of breath earlier today while at rest.  She became very somnolent and difficult to arouse.  EMS were called and on their arrival SPO2 was in the low 80s.  She was placed on 12 L NRB.  On route to the ED she was given 125 mg Solu-Medrol, 2 g magnesium, and albuterol and Atrovent treatments.  Husband states that she has not been adherent to BiPAP nightly due to intolerance.  ED Course:  Initial vitals show BP 108/71, pulse 95, RR 24, temp 98.5 F, SPO2 98% on 15 L NRB.  Patient subsequently placed on BiPAP.  Labs show WBC 32.1, hemoglobin 11.3, platelets 293,000, sodium 143, potassium 3.5, chloride 93, bicarb 41, BUN 5, creatinine 0.53, serum glucose 123, high-sensitivity troponin I 23 Ramzi 21, lactic acid 1.1, BNP 86.7, i-STAT beta-hCG 21.1.  Initial VBG showed pH 7.312, PCO2 87.7, PO2 31.0.  Repeat VBG after time on BiPAP showed pH 7.355, PCO2 of 79.3, PO2 69.  SARS-CoV-2 PCR panel is ordered and pending collection.  Portable chest x-ray showed emphysematous changes without focal consolidation, edema, or  effusion.  Patient was given continuous albuterol and Atrovent nebulizer treatments.  The hospitalist service was consulted to admit for further evaluation and management.  Review of Systems: All systems reviewed and are negative except as documented in history of present illness above.   Past Medical History:  Diagnosis Date   Allergic rhinitis    Asthma    CHF (congestive heart failure) (HCC)    Chronic pain    COPD (chronic obstructive pulmonary disease) (HCC)    on home O2   GERD (gastroesophageal reflux disease)    Headache    History of COVID-19    Hypertension    Neck pain    Open wound of left hip    spouse changes dressing q day   Shortness of breath    Sleep apnea    NO DEVICE IN USE, USES SUPPLEMENTAL O2 IN PLACE OF DEVICE    Supplemental oxygen dependent    4L CONTINUOUS     Past Surgical History:  Procedure Laterality Date   COLONOSCOPY     HUMERUS IM NAIL Left 07/23/2017   Procedure: INTRAMEDULLARY (IM) NAIL HUMERAL;  Surgeon: Yolonda Kidaogers, Jason Patrick, MD;  Location: [facility] OR;  Service: Orthopedics;  Laterality: Left;   INCISION AND DRAINAGE HIP Left 04/01/2019   Procedure: IRRIGATION AND DEBRIDEMENT HIP;  Surgeon: Samson FredericSwinteck, Brian, MD;  Location: [facility] ORS;  Service: Orthopedics;  Laterality: Left;   LUMBAR LAMINECTOMY/DECOMPRESSION MICRODISCECTOMY  04/06/2012   Procedure: LUMBAR LAMINECTOMY/DECOMPRESSION MICRODISCECTOMY;  Surgeon: Kerrin ChampagneJames E Nitka, MD;  Location: [facility] OR;  Service: Orthopedics;  Laterality: N/A;  Left L3-4 and  L4-5 lateral recess decompression MIS   NECK SURGERY  Sept 2005   TOTAL ABDOMINAL HYSTERECTOMY  Oct 2003   partial   TOTAL HIP ARTHROPLASTY Left 02/18/2019   Procedure: TOTAL HIP ARTHROPLASTY ANTERIOR APPROACH;  Surgeon: Samson Frederic, MD;  Location: [facility] ORS;  Service: Orthopedics;  Laterality: Left;   TUBAL LIGATION  1995    Social History:  reports that she has been smoking cigarettes. She has a 15.00 pack-year smoking history. She has never used  smokeless tobacco. She reports previous alcohol use. She reports that she does not use drugs.  Allergies  Allergen Reactions   Amoxicillin-Pot Clavulanate Itching    Ok with benadryl Did it involve swelling of the face/tongue/throat, SOB, or low BP? No Did it involve sudden or severe rash/hives, skin peeling, or any reaction on the inside of your mouth or nose? No Did you need to seek medical attention at a hospital or doctor's office? No When did it last happen?      6 months If all above answers are "NO", may proceed with cephalosporin use.    Erythromycin Rash    Ok with benadryl    Moxifloxacin Itching    Reaction to Avelox - ok with benadryl   Sulfamethoxazole-Trimethoprim Itching    Ok with benadryl    Family History  Problem Relation Age of Onset   Emphysema Father    Rheum arthritis Father    Lung cancer Father    Emphysema Mother      Prior to Admission medications   Medication Sig Start Date End Date Taking? Authorizing Provider  ascorbic acid (VITAMIN C) 500 MG tablet Take 1 tablet (500 mg total) by mouth daily. 11/21/20   Kathlen Mody, MD  aspirin EC 81 MG tablet Take 81 mg by mouth daily. Swallow whole.    [provider]  Budeson-Glycopyrrol-Formoterol (BREZTRI AEROSPHERE) 160-9-4.8 MCG/ACT AERO Inhale 2 puffs into the lungs in the morning and at bedtime.    [provider]  cetirizine (ZYRTEC) 10 MG tablet Take 10 mg daily by mouth.    [provider]  COLLAGEN PO Take 1,000 mg by mouth 3 (three) times daily.    [provider]  dextromethorphan-guaiFENesin (MUCINEX DM) 30-600 MG 12hr tablet Take 1 tablet by mouth 2 (two) times daily.    [provider]  DULoxetine (CYMBALTA) 30 MG capsule Take 30 mg by mouth daily. 07/12/20   [provider]  fluticasone (FLONASE) 50 MCG/ACT nasal spray Place 1 spray into both nostrils daily as needed for allergies.    [provider]  furosemide (LASIX) 20 MG tablet  Take 1 tablet (20 mg total) by mouth daily as needed for edema (weight gain of 3lbs in 1 day or 5 lbs in 2 days.). Patient taking differently: Take 20 mg by mouth daily. 07/27/20   Rolly Salter, MD  gabapentin (NEURONTIN) 400 MG capsule Take 400 mg by mouth 3 (three) times daily. 08/09/20   [provider]  HYDROcodone-acetaminophen (NORCO) 10-325 MG tablet Take 1 tablet by mouth every 6 (six) hours as needed (breakthrough pain). Patient taking differently: Take 1 tablet by mouth 3 (three) times daily. 04/02/19   Swinteck, Arlys John, MD  losartan-hydrochlorothiazide (HYZAAR) 100-12.5 MG tablet Take 1 tablet by mouth daily. 06/15/20   [provider]  montelukast (SINGULAIR) 10 MG tablet Take 10 mg at bedtime by mouth.    [provider]  morphine (MS CONTIN) 30 MG 12 hr tablet Take 30 mg by mouth  every 12 (twelve) hours.    [provider]  nystatin-triamcinolone ointment (MYCOLOG) Apply 1 application topically 2 (two) times daily as needed (rash). 10/16/20   [provider]  OXYGEN Inhale 4 L into the lungs continuous.     [provider]  pantoprazole (PROTONIX) 40 MG tablet Take 40 mg by mouth 2 (two) times daily. 08/16/20   [provider]  polyethylene glycol powder (GLYCOLAX/MIRALAX) 17 GM/SCOOP powder Take 17 g by mouth daily as needed for mild constipation. 09/04/20   [provider]  predniSONE (DELTASONE) 10 MG tablet Prednisone 30 mg daily for 3 days followed by  Prednisone 20 mg daily for 3 days followed by  Prendisone 10 mg daily for 3 days. 11/20/20   Kathlen Mody, MD  senna-docusate (SENOKOT-S) 8.6-50 MG tablet Take 2 tablets by mouth 2 (two) times daily. 11/20/20   Kathlen Mody, MD  traZODone (DESYREL) 50 MG tablet Take 1 tablet (50 mg total) by mouth at bedtime as needed for sleep. 11/20/20   Kathlen Mody, MD  Vitamin D, Ergocalciferol, (DRISDOL) 1.25 MG (50000 UNIT) CAPS capsule Take 50,000 Units by mouth every  Monday. 06/15/20   [provider]  zinc sulfate 220 (50 Zn) MG capsule Take 1 capsule (220 mg total) by mouth daily. 11/21/20   Kathlen Mody, MD    Physical Exam: Vitals:   02/16/21 1919 02/16/21 1930 02/16/21 1945 02/16/21 2000  BP:  111/85 114/68 108/69  Pulse: 80 83 93 93  Resp: (!) 22  Temp:      TempSrc:      SpO2: 92% 92% 93% 94%  Weight:      Height:       Exam limited due to excessive somnolence. Constitutional: Obese woman resting in bed with head elevated currently wearing BiPAP, somnolent and will open eyes to loud voice and painful stimuli but otherwise not following commands or communicating Eyes: PERRL, lids and conjunctivae normal ENMT: Mucous membranes are moist. Posterior pharynx clear of any exudate or lesions.Normal dentition.  Neck: normal, supple, no masses. Respiratory: Distant breath sounds with end expiratory wheezing.  Normal respiratory effort while on BiPAP. No accessory muscle use while on BiPAP.  Cardiovascular: Regular rate and rhythm, no murmurs / rubs / gallops.  +1 bilateral lower extremity edema, compression stocking in place. 2+ pedal pulses. Abdomen: no tenderness, no masses palpated. Musculoskeletal: no clubbing / cyanosis. No joint deformity upper and lower extremities. No contractures. Normal muscle tone.  Skin: no rashes, lesions, ulcers. No induration Neurologic: Somnolent, moving extremities spontaneously, sensation appears intact.Marland Kitchen  Psychiatric: Excessively somnolent.  Labs on Admission: I have personally reviewed following labs and imaging studies  CBC: Recent Labs  Lab 02/16/21 1735 02/16/21 1856 02/16/21 2023  WBC 32.1*  --   --   NEUTROABS 30.5*  --   --   HGB 11.3* 11.9* 12.2  HCT 39.6 35.0* 36.0  MCV 90.6  --   --   PLT 293  --   --    Basic Metabolic Panel: Recent Labs  Lab 02/16/21 1735 02/16/21 1856 02/16/21 2023  NA 143 140 140  K 3.5 3.3* 3.7  CL 93*  --   --   CO2 41*  --   --   GLUCOSE 123*   --   --   BUN 5*  --   --   CREATININE 0.53  --   --   CALCIUM 9.0  --   --    GFR:  Estimated Creatinine Clearance: 80.9 mL/min (by C-G formula based on SCr of 0.53 mg/dL). Liver Function Tests: Recent Labs  Lab 02/16/21 1735  AST 12*  ALT 15  ALKPHOS 63  BILITOT 0.6  PROT 6.2*  ALBUMIN 3.5   No results for input(s): LIPASE, AMYLASE in the last 168 hours. No results for input(s): AMMONIA in the last 168 hours. Coagulation Profile: No results for input(s): INR, PROTIME in the last 168 hours. Cardiac Enzymes: No results for input(s): CKTOTAL, CKMB, CKMBINDEX, TROPONINI in the last 168 hours. BNP (last 3 results) No results for input(s): PROBNP in the last 8760 hours. HbA1C: No results for input(s): HGBA1C in the last 72 hours. CBG: No results for input(s): GLUCAP in the last 168 hours. Lipid Profile: No results for input(s): CHOL, HDL, LDLCALC, TRIG, CHOLHDL, LDLDIRECT in the last 72 hours. Thyroid Function Tests: No results for input(s): TSH, T4TOTAL, FREET4, T3FREE, THYROIDAB in the last 72 hours. Anemia Panel: No results for input(s): VITAMINB12, FOLATE, FERRITIN, TIBC, IRON, RETICCTPCT in the last 72 hours. Urine analysis:    Component Value Date/Time   COLORURINE STRAW (A) 02/15/2019 1309   APPEARANCEUR CLEAR 02/15/2019 1309   LABSPEC 1.002 (L) 02/15/2019 1309   PHURINE 7.0 02/15/2019 1309   GLUCOSEU NEGATIVE 02/15/2019 1309   HGBUR NEGATIVE 02/15/2019 1309   BILIRUBINUR NEGATIVE 02/15/2019 1309   KETONESUR NEGATIVE 02/15/2019 1309   PROTEINUR NEGATIVE 02/15/2019 1309   UROBILINOGEN 0.2 03/31/2012 1338   NITRITE NEGATIVE 02/15/2019 1309   LEUKOCYTESUR NEGATIVE 02/15/2019 1309    Radiological Exams on Admission: DG Chest Port 1 View  Result Date: 02/16/2021 CLINICAL DATA:  Respiratory distress. EXAM: PORTABLE CHEST 1 VIEW COMPARISON:  Chest x-ray dated November 12, 2020. FINDINGS: The heart size and mediastinal contours are within normal limits. Emphysematous  changes again noted. Chronically coarsened interstitial markings, worse at the lung bases. No focal consolidation, pleural effusion, or pneumothorax. No acute osseous abnormality. IMPRESSION: 1. COPD. No active disease. Electronically Signed   By: Obie Dredge M.D.   On: 02/16/2021 18:28    EKG: Personally reviewed. Sinus rhythm without acute ischemic changes.  Assessment/Plan Principal Problem:   Acute on chronic respiratory failure with hypoxia and hypercapnia (HCC) Active Problems:   COPD exacerbation (HCC)   Essential hypertension   Sleep apnea   Aortic stenosis   [name] is a 55 y.o. female with medical history significant for severe COPD, chronic hypoxic and hypercapnic respiratory failure on 4 L upon O2 via [place] at all times, OSA nonadherent to BiPAP, severe AS and consideration for TAVR, hypertension, hyperlipidemia, mild nonobstructive CAD, GERD, depression/anxiety, and chronic pain who is admitted with acute on chronic hypoxic and hypercapnic respiratory failure due to COPD and OSA.  Acute on chronic hypoxic and hypercapnic respiratory failure due to COPD exacerbation and OSA/OHS: On 4 L O2 via  at baseline, nonadherent to BiPAP.  Worsening hypercapnia from baseline likely multifactorial from COPD and untreated OSA/OHS. -Admit to stepdown unit -BiPAP mandatory nightly and as needed during the day -IV Solu-Medrol 40 mg twice daily -Scheduled DuoNebs with as needed albuterol nebulizers -Continue Breztri  Acute metabolic encephalopathy: Secondary to hypercapnia/CO2 narcosis.  Continue management as above.  Leukocytosis: Suspect related to steroids given prior to arrival.  No obvious infection.  Continue to monitor.  Hypertension: Currently stable, home antihypertensives on hold while NPO.  Severe aortic stenosis: Following with [facility] cardiology who are considering TAVR.  Depression/anxiety: Resume meds when able.  Chronic pain: On chronic morphine  and  Norco which are being held due to acute respiratory failure.  Resume when able.  DVT prophylaxis: Lovenox Code Status: Full code, discussed with patient's husband at bedside Family Communication: Discussed with patient's husband at bedside Disposition Plan: From home, dispo pending improvement in respiratory status and mentation Consults called: None Level of care: Progressive Admission status:  Status is: Inpatient  Remains inpatient appropriate because:IV treatments appropriate due to intensity of illness or inability to take PO and Inpatient level of care appropriate due to severity of illness  Dispo: The patient is from: Home              Anticipated d/c is to: Home              Patient currently is not medically stable to d/c.   Difficult to place patient No   Darreld Mclean MD [facility]  If 7PM-7AM, please contact night-coverage [email or web address]  02/16/2021, 9:04 PM

## 2021-02-16 NOTE — Progress Notes (Signed)
Patient is refusing NIV at this time. Patient states it makes her anxious. Once patient got to the floor, patient presented w/ Increase WOB. BBS to auscultation reveal severely decrease aeration throughout. Neb given.  Patient is currently on a nasal cannula. RN aware

## 2021-02-17 ENCOUNTER — Other Ambulatory Visit: Payer: Self-pay

## 2021-02-17 LAB — CBC
HCT: 37.9 % (ref 36.0–46.0)
Hemoglobin: 11.2 g/dL — ABNORMAL LOW (ref 12.0–15.0)
MCH: 26 pg (ref 26.0–34.0)
MCHC: 29.6 g/dL — ABNORMAL LOW (ref 30.0–36.0)
MCV: 88.1 fL (ref 80.0–100.0)
Platelets: 290 10*3/uL (ref 150–400)
RBC: 4.3 MIL/uL (ref 3.87–5.11)
RDW: 16.7 % — ABNORMAL HIGH (ref 11.5–15.5)
WBC: 29.8 10*3/uL — ABNORMAL HIGH (ref 4.0–10.5)
nRBC: 0 % (ref 0.0–0.2)

## 2021-02-17 LAB — BASIC METABOLIC PANEL
Anion gap: 10 (ref 5–15)
BUN: 7 mg/dL (ref 6–20)
CO2: 37 mmol/L — ABNORMAL HIGH (ref 22–32)
Calcium: 8.6 mg/dL — ABNORMAL LOW (ref 8.9–10.3)
Chloride: 94 mmol/L — ABNORMAL LOW (ref 98–111)
Creatinine, Ser: 0.63 mg/dL (ref 0.44–1.00)
GFR, Estimated: >60 mL/min (ref >60)
Glucose, Bld: 184 mg/dL — ABNORMAL HIGH (ref 70–99)
Potassium: 4.5 mmol/L (ref 3.5–5.1)
Sodium: 141 mmol/L (ref 135–145)

## 2021-02-17 MED ORDER — ASPIRIN EC 81 MG PO TBEC
81.0000 mg | DELAYED_RELEASE_TABLET | Freq: Every day | ORAL | Status: DC
  Administered 2021-02-17 – 2021-02-19 (×3): 81 mg via ORAL
  Filled 2021-02-17 (×3): qty 1

## 2021-02-17 MED ORDER — MONTELUKAST SODIUM 10 MG PO TABS
10.0000 mg | ORAL_TABLET | Freq: Every day | ORAL | Status: DC
  Administered 2021-02-17 – 2021-02-18 (×2): 10 mg via ORAL
  Filled 2021-02-17 (×2): qty 1

## 2021-02-17 MED ORDER — CHLORHEXIDINE GLUCONATE 0.12 % MT SOLN
15.0000 mL | Freq: Two times a day (BID) | OROMUCOSAL | Status: DC
  Administered 2021-02-17 – 2021-02-19 (×5): 15 mL via OROMUCOSAL
  Filled 2021-02-17 (×5): qty 15

## 2021-02-17 MED ORDER — ORAL CARE MOUTH RINSE: 15.0000 mL | Freq: Two times a day (BID) | OROMUCOSAL | Status: DC

## 2021-02-17 MED ORDER — IPRATROPIUM-ALBUTEROL 0.5-2.5 (3) MG/3ML IN SOLN
3.0000 mL | RESPIRATORY_TRACT | Status: DC
  Administered 2021-02-18 – 2021-02-19 (×9): 3 mL via RESPIRATORY_TRACT
  Filled 2021-02-17 (×10): qty 3

## 2021-02-17 MED ORDER — HYDROCODONE-ACETAMINOPHEN 10-325 MG PO TABS
1.0000 | ORAL_TABLET | Freq: Four times a day (QID) | ORAL | Status: DC | PRN
  Administered 2021-02-17 – 2021-02-19 (×6): 1 via ORAL
  Filled 2021-02-17 (×6): qty 1

## 2021-02-17 MED ORDER — ALPRAZOLAM 0.5 MG PO TABS
0.5000 mg | ORAL_TABLET | Freq: Four times a day (QID) | ORAL | Status: DC | PRN
  Administered 2021-02-17 – 2021-02-18 (×2): 0.5 mg via ORAL
  Filled 2021-02-17 (×2): qty 1

## 2021-02-17 NOTE — Plan of Care (Signed)
  Problem: Clinical Measurements: Goal: Ability to maintain clinical measurements within normal limits will improve Outcome: Progressing Goal: Will remain free from infection Outcome: Progressing Goal: Respiratory complications will improve Outcome: Progressing Goal: Cardiovascular complication will be avoided Outcome: Progressing   

## 2021-02-17 NOTE — Progress Notes (Signed)
PROGRESS NOTE    ALOHA BARTOK  QPR:[id number] DOB: 1966-09-06 DOA: 02/16/2021 PCP: Nichole Inch, MD    Brief Narrative:  [name] is a 55 y.o. female with medical history significant for severe COPD, chronic hypoxic and hypercapnic respiratory failure on 4 L upon O2 via [place] at all times, OSA nonadherent to BiPAP, severe AS and consideration for TAVR, hypertension, hyperlipidemia, mild nonobstructive CAD, GERD, depression/anxiety, and chronic pain who was brought to the ED for evaluation of respiratory distress.  History is limited from patient due to excessive somnolence and is otherwise obtained from EDP, chart review, and husband at bedside.  Patient had sudden onset of severe shortness of breath earlier today while at rest.  She became very somnolent and difficult to arouse.  EMS were called and on their arrival SPO2 was in the low 80s.  She was placed on 12 L NRB.  On route to the ED she was given 125 mg Solu-Medrol, 2 g magnesium, and albuterol and Atrovent treatments.   Husband states that she has not been adherent to BiPAP nightly due to intolerance.   In the ED, BP 108/71, pulse 95, RR 24, temp 98.5 F, SPO2 98% on 15 L NRB.  Patient subsequently placed on BiPAP. WBC 32.1, hemoglobin 11.3, platelets 293,000, sodium 143, potassium 3.5, chloride 93, bicarb 41, BUN 5, creatinine 0.53, serum glucose 123, high-sensitivity troponin I 23, lactic acid 1.1, BNP 86.7, i-STAT beta-hCG 21.1. VBG showed pH 7.312, PCO2 87.7, PO2 31.0.  Repeat VBG after time on BiPAP showed pH 7.355, PCO2 of 79.3, PO2 69. COVID-19/FLU A/B PCR negative. Portable chest x-ray showed emphysematous changes without focal consolidation, edema, or effusion. Patient was given continuous albuterol and Atrovent nebulizer treatments.  The hospitalist service was consulted to admit for further evaluation and management.   Assessment & Plan:   Principal Problem:   Acute on chronic respiratory failure with hypoxia and  hypercapnia (HCC) Active Problems:   COPD exacerbation (HCC)   Essential hypertension   Sleep apnea   Aortic stenosis  Acute metabolic encephalopathy: Secondary to hypercapnia/CO2 narcosis.  Continue management as below  Acute on chronic hypoxic and hypercapnic respiratory failure, POA COPD exacerbation On 4 L O2 via [place] at baseline, nonadherent to BiPAP.  Worsening hypercapnia from baseline likely multifactorial from COPD and untreated OSA/OHS.  Cova-19 PCR and influenza A/B PCR negative.  BNP 86.7.  Chest x-ray with findings of COPD, otherwise no other acute cardiopulmonary disease process. --Solu-Medrol 40 mg IV every 12 hours --BiPAP mandatory nightly and as needed during the day --Scheduled DuoNebs with as needed albuterol nebulizers -- Continues on monitoring, maintain SPO2 greater than 88%, on 4 L nasal cannula which is her home baseline   Leukocytosis: Suspect related to steroids given prior to arrival.  No obvious infection.   --CBC in am   Hypertension: --BP 122/69, stable.  On losartan-HCTZ 100-12.5 mg p.o. daily at home. --Continue to hold home antihypertensives  --Continue monitor BP closely   Severe aortic stenosis: Following with [facility] cardiology who are considering TAVR.   Depression/anxiety: --Resume home Xanax   Chronic pain: On chronic morphine, Norco, Cymbalta and gabapentin at home.   --Continue to hold morphine, gabapentin, Cymbalta --Resume home Norco  OSA/OHS: Patient is apparently not adherent to her home BiPAP. --Continue BiPAP while sleeping, and nightly  Weakness/deconditioning/debility: Patient lives at home with spouse.  Utilizes walker at baseline.  Feels more weak and fatigued. --PT evaluation    DVT prophylaxis: enoxaparin (  LOVENOX) injection 40 mg Start: 02/17/21 1000    Code Status: Full Code Family Communication: No family present at bedside this morning  Disposition Plan:  Level of care: Progressive Status is:  Inpatient  Remains inpatient appropriate because:Altered mental status, Ongoing diagnostic testing needed not appropriate for outpatient work up, Unsafe d/c plan, IV treatments appropriate due to intensity of illness or inability to take PO, and Inpatient level of care appropriate due to severity of illness  Dispo: The patient is from: Home              Anticipated d/c is to: Home              Patient currently is not medically stable to d/c.   Difficult to place patient No  Consultants:  None  Procedures:  None  Antimicrobials:  None   Subjective: Patient seen examined at bedside, resting comfortably.  On 4 L nasal cannula.  Refused BiPAP earlier this morning.  Confusion seems to be improved.  Continues with significant dyspnea, fatigue, weakness.  Family present at bedside this morning.  No other questions or concerns at this time.  Denies headache, no dizziness, no chest pain, no palpitations, no abdominal pain.  No acute concerns overnight per nursing staff.  Objective: Vitals:   02/17/21 0531 02/17/21 0800 02/17/21 0813 02/17/21 1453  BP:   122/69   Pulse:  74 86   Resp:  15 15   Temp:      TempSrc:      SpO2: 100% 99% 100% 99%  Weight: 80.4 kg     Height:       No intake or output data in the 24 hours ending 02/17/21 1531 Filed Weights   02/16/21 1727 02/16/21 2209 02/17/21 0531  Weight: 82.6 kg 79.4 kg 80.4 kg    Examination:  General exam: Chronically ill appearance, calm, appears older than stated age Respiratory system: Breath sounds slight diminished with expiratory wheezing, normal respiratory effort without accessory muscle use, on 4 L nasal cannula which is her baseline Cardiovascular system: S1 & S2 heard, RRR. No JVD, murmurs, rubs, gallops or clicks. No pedal edema. Gastrointestinal system: Abdomen is nondistended, soft and nontender. No organomegaly or masses felt. Normal bowel sounds heard. Central nervous system: Alert and oriented. No focal  neurological deficits. Extremities: Symmetric 5 x 5 power. Skin: No rashes, lesions or ulcers Psychiatry: Judgement and insight appear poor. Mood & affect appropriate.     Data Reviewed: I have personally reviewed following labs and imaging studies  CBC: Recent Labs  Lab 02/16/21 1735 02/16/21 1856 02/16/21 2023 02/17/21 0236  WBC 32.1*  --   --  29.8*  NEUTROABS 30.5*  --   --   --   HGB 11.3* 11.9* 12.2 11.2*  HCT 39.6 35.0* 36.0 37.9  MCV 90.6  --   --  88.1  PLT 293  --   --  290   Basic Metabolic Panel: Recent Labs  Lab 02/16/21 1735 02/16/21 1856 02/16/21 2023 02/17/21 0236  NA 143 140 140 141  K 3.5 3.3* 3.7 4.5  CL 93*  --   --  94*  CO2 41*  --   --  37*  GLUCOSE 123*  --   --  184*  BUN 5*  --   --  7  CREATININE 0.53  --   --  0.63  CALCIUM 9.0  --   --  8.6*   GFR: Estimated Creatinine Clearance: 79.8 mL/min (by C-G  formula based on SCr of 0.63 mg/dL). Liver Function Tests: Recent Labs  Lab 02/16/21 1735  AST 12*  ALT 15  ALKPHOS 63  BILITOT 0.6  PROT 6.2*  ALBUMIN 3.5   No results for input(s): LIPASE, AMYLASE in the last 168 hours. No results for input(s): AMMONIA in the last 168 hours. Coagulation Profile: No results for input(s): INR, PROTIME in the last 168 hours. Cardiac Enzymes: No results for input(s): CKTOTAL, CKMB, CKMBINDEX, TROPONINI in the last 168 hours. BNP (last 3 results) No results for input(s): PROBNP in the last 8760 hours. HbA1C: No results for input(s): HGBA1C in the last 72 hours. CBG: No results for input(s): GLUCAP in the last 168 hours. Lipid Profile: No results for input(s): CHOL, HDL, LDLCALC, TRIG, CHOLHDL, LDLDIRECT in the last 72 hours. Thyroid Function Tests: No results for input(s): TSH, T4TOTAL, FREET4, T3FREE, THYROIDAB in the last 72 hours. Anemia Panel: No results for input(s): VITAMINB12, FOLATE, FERRITIN, TIBC, IRON, RETICCTPCT in the last 72 hours. Sepsis Labs: Recent Labs  Lab 02/16/21 1715   LATICACIDVEN 1.1    Recent Results (from the past 240 hour(s))  Resp Panel by RT-PCR (Flu A&B, Covid) Nasopharyngeal Swab     Status: None   Collection Time: 02/16/21  9:07 PM   Specimen: Nasopharyngeal Swab; Nasopharyngeal(NP) swabs in vial transport medium  Result Value Ref Range Status   SARS Coronavirus 2 by RT PCR NEGATIVE NEGATIVE Final    Comment: (NOTE) SARS-CoV-2 target nucleic acids are NOT DETECTED.  The SARS-CoV-2 RNA is generally detectable in upper respiratory specimens during the acute phase of infection. The lowest concentration of SARS-CoV-2 viral copies this assay can detect is 138 copies/mL. A negative result does not preclude SARS-Cov-2 infection and should not be used as the sole basis for treatment or other patient management decisions. A negative result may occur with  improper specimen collection/handling, submission of specimen other than nasopharyngeal swab, presence of viral mutation(s) within the areas targeted by this assay, and inadequate number of viral copies(<138 copies/mL). A negative result must be combined with clinical observations, patient history, and epidemiological information. The expected result is Negative.  Fact Sheet for Patients:  BloggerCourse.com  Fact Sheet for Healthcare Providers:  SeriousBroker.it  This test is no t yet approved or cleared by the [place] FDA and  has been authorized for detection and/or diagnosis of SARS-CoV-2 by FDA under an Emergency Use Authorization (EUA). This EUA will remain  in effect (meaning this test can be used) for the duration of the COVID-19 declaration under Section 564(b)(1) of the Act, 21 U.S.C.section 360bbb-3(b)(1), unless the authorization is terminated  or revoked sooner.       Influenza A by PCR NEGATIVE NEGATIVE Final   Influenza B by PCR NEGATIVE NEGATIVE Final    Comment: (NOTE) The Xpert Xpress SARS-CoV-2/FLU/RSV plus  assay is intended as an aid in the diagnosis of influenza from Nasopharyngeal swab specimens and should not be used as a sole basis for treatment. Nasal washings and aspirates are unacceptable for Xpert Xpress SARS-CoV-2/FLU/RSV testing.  Fact Sheet for Patients: BloggerCourse.com  Fact Sheet for Healthcare Providers: SeriousBroker.it  This test is not yet approved or cleared by the [place] FDA and has been authorized for detection and/or diagnosis of SARS-CoV-2 by FDA under an Emergency Use Authorization (EUA). This EUA will remain in effect (meaning this test can be used) for the duration of the COVID-19 declaration under Section 564(b)(1) of the Act, 21 U.S.C. section 360bbb-3(b)(1), unless the  authorization is terminated or revoked.  Performed at [facility], [address] [place], [place] [address]          Radiology Studies: DG Chest Port 1 View  Result Date: 02/16/2021 CLINICAL DATA:  Respiratory distress. EXAM: PORTABLE CHEST 1 VIEW COMPARISON:  Chest x-ray dated November 12, 2020. FINDINGS: The heart size and mediastinal contours are within normal limits. Emphysematous changes again noted. Chronically coarsened interstitial markings, worse at the lung bases. No focal consolidation, pleural effusion, or pneumothorax. No acute osseous abnormality. IMPRESSION: 1. COPD. No active disease. Electronically Signed   By: Obie DredgeWilliam T Derry M.D.   On: 02/16/2021 18:28        Scheduled Meds:  albuterol  15 mg/hr Nebulization Once   chlorhexidine  15 mL Mouth Rinse BID   enoxaparin (LOVENOX) injection  40 mg Subcutaneous Q24H   fluticasone furoate-vilanterol  1 puff Inhalation Daily   ipratropium  0.5 mg Nebulization Once   ipratropium-albuterol  3 mL Nebulization Q6H   mouth rinse  15 mL Mouth Rinse q12n4p   methylPREDNISolone (SOLU-MEDROL) injection  40 mg Intravenous Q12H   sodium chloride flush  3 mL Intravenous  Q12H   umeclidinium bromide  1 puff Inhalation Daily   Continuous Infusions:   LOS: 1 day    Time spent: 42 minutes spent on chart review, discussion with nursing staff, consultants, updating family and interview/physical exam; more than 50% of that time was spent in counseling and/or coordination of care.    Alvira PhilipsEric J [place], DO [facility] Available via Epic secure chat 7am-7pm After these hours, please refer to coverage provider listed on [email or web address] 02/17/2021, 3:31 PM

## 2021-02-18 LAB — BASIC METABOLIC PANEL
Anion gap: 7 (ref 5–15)
BUN: 11 mg/dL (ref 6–20)
CO2: 33 mmol/L — ABNORMAL HIGH (ref 22–32)
Calcium: 8.7 mg/dL — ABNORMAL LOW (ref 8.9–10.3)
Chloride: 98 mmol/L (ref 98–111)
Creatinine, Ser: 0.45 mg/dL (ref 0.44–1.00)
GFR, Estimated: >60 mL/min (ref >60)
Glucose, Bld: 140 mg/dL — ABNORMAL HIGH (ref 70–99)
Potassium: 3.8 mmol/L (ref 3.5–5.1)
Sodium: 138 mmol/L (ref 135–145)

## 2021-02-18 LAB — CBC
HCT: 36.3 % (ref 36.0–46.0)
Hemoglobin: 10.7 g/dL — ABNORMAL LOW (ref 12.0–15.0)
MCH: 25.4 pg — ABNORMAL LOW (ref 26.0–34.0)
MCHC: 29.5 g/dL — ABNORMAL LOW (ref 30.0–36.0)
MCV: 86.2 fL (ref 80.0–100.0)
Platelets: 302 10*3/uL (ref 150–400)
RBC: 4.21 MIL/uL (ref 3.87–5.11)
RDW: 16.5 % — ABNORMAL HIGH (ref 11.5–15.5)
WBC: 24.1 10*3/uL — ABNORMAL HIGH (ref 4.0–10.5)
nRBC: 0 % (ref 0.0–0.2)

## 2021-02-18 MED ORDER — GABAPENTIN 400 MG PO CAPS
400.0000 mg | ORAL_CAPSULE | Freq: Three times a day (TID) | ORAL | Status: DC
  Administered 2021-02-18 – 2021-02-19 (×3): 400 mg via ORAL
  Filled 2021-02-18 (×3): qty 1

## 2021-02-18 MED ORDER — PREDNISONE 20 MG PO TABS
50.0000 mg | ORAL_TABLET | Freq: Every day | ORAL | Status: DC
  Administered 2021-02-19: 50 mg via ORAL
  Filled 2021-02-18: qty 2

## 2021-02-18 NOTE — Evaluation (Signed)
Physical Therapy Evaluation Patient Details Name: [name] MRN: [id number] DOB: [birth year] Today's Date: 02/18/2021   History of Present Illness  [name] is a 55 y.o. female who was brought to the ED for evaluation of respiratory distress. with medical history significant for severe COPD, chronic hypoxic and hypercapnic respiratory failure on 4 L upon O2 via [place] at all times, OSA nonadherent to BiPAP, severe AS and consideration for TAVR, hypertension, hyperlipidemia, mild nonobstructive CAD, GERD, depression/anxiety, and chronic pain  Clinical Impression   Pt admitted with above diagnosis. Lives at home with husband in a two level house; has not gone [place] in quite a while; sleeps in recliner, sponge bathes in half bath on first floor; walks household distance with RW; Presents to PT with decr functional capacity; needs to incr supplemental O2 to 6L for activity at this time; RN aware;  Pt currently with functional limitations due to the deficits listed below (see PT Problem List). Pt will benefit from skilled PT to increase their independence and safety with mobility to allow discharge to the venue listed below.       Follow Up Recommendations Home health PT    Equipment Recommendations  Wheelchair (measurements PT);Wheelchair cushion (measurements PT)    Recommendations for Other Services       Precautions / Restrictions Precautions Precautions: Other (comment) Precaution Comments: O2 sat drop with activity      Mobility  Bed Mobility Overal bed mobility: Needs Assistance Bed Mobility: Supine to Sit     Supine to sit: Min assist     General bed mobility comments: Overall managing well; cues to self-monitro for activity tolerance    Transfers Overall transfer level: Needs assistance Equipment used: Rolling walker (2 wheeled) Transfers: Sit to/from Stand Sit to Stand: Min guard (without physical contact)         General transfer comment: Good  rise  Ambulation/Gait Ambulation/Gait assistance: Min guard Gait Distance (Feet): 12 Feet Assistive device: Rolling walker (2 wheeled) Gait Pattern/deviations: Step-through pattern;Decreased step length - right;Decreased step length - left     General Gait Details: Walked around bed to recliner by window; increased O2 to 6L for activity  Careers information officer    Modified Rankin (Stroke Patients Only)       Balance                                             Pertinent Vitals/Pain Pain Assessment: Faces Faces Pain Scale: Hurts even more Pain Location: Back and neck pain Pain Descriptors / Indicators: Aching Pain Intervention(s): Repositioned    Home Living Family/patient expects to be discharged to:: Private residence Living Arrangements: Spouse/significant other Available Help at Discharge: Family;Available 24 hours/day Type of Home: House Home Access: Stairs to enter Entrance Stairs-Rails: Can reach both;Left;Right Entrance Stairs-Number of Steps: 1 Home Layout: Two level;Able to live on main level with bedroom/bathroom Home Equipment: Dan Humphreys - 2 wheels;Shower seat;Adaptive equipment;Hand held shower head;Walker - 4 wheels;Bedside commode;Wheelchair - manual Additional Comments: home O2 at 4L    Prior Function Level of Independence: Needs assistance   Gait / Transfers Assistance Needed: Pt reports she sometimes uses RW but mainly will hold onto walls in home, max 50' at a time  ADL's / Homemaking Assistance Needed: Assistance for showers and LB dressing tasks as  needed, sleeps in a recliner        Hand Dominance   Dominant Hand: Right    Extremity/Trunk Assessment   Upper Extremity Assessment Upper Extremity Assessment: Generalized weakness    Lower Extremity Assessment Lower Extremity Assessment: Generalized weakness       Communication   Communication: No difficulties  Cognition Arousal/Alertness:  Awake/alert Behavior During Therapy: WFL for tasks assessed/performed Overall Cognitive Status: Within Functional Limits for tasks assessed                                        General Comments General comments (skin integrity, edema, etc.): Initially go tup to [facility] on 4 L supplemental O2 and O2 sats decr to 85%, pt quite short of breath; incr supplemental O2 to 6L and sats rose to 98%; walked short distance on 6L and O2 sats decr to 88%; RN notified    Exercises     Assessment/Plan    PT Assessment Patient needs continued PT services  PT Problem List Decreased strength;Decreased activity tolerance;Decreased balance;Decreased mobility;Cardiopulmonary status limiting activity       PT Treatment Interventions DME instruction;Gait training;Stair training;Functional mobility training;Therapeutic activities;Therapeutic exercise;Balance training;Patient/family education    PT Goals (Current goals can be found in the Care Plan section)  Acute Rehab PT Goals Patient Stated Goal: Hopes to be able to get home soon PT Goal Formulation: With patient Time For Goal Achievement: 03/04/21 Potential to Achieve Goals: Good    Frequency Min 3X/week   Barriers to discharge        Co-evaluation               AM-PAC PT "6 Clicks" Mobility  Outcome Measure Help needed turning from your back to your side while in a flat bed without using bedrails?: None Help needed moving from lying on your back to sitting on the side of a flat bed without using bedrails?: A Little Help needed moving to and from a bed to a chair (including a wheelchair)?: A Little Help needed standing up from a chair using your arms (e.g., wheelchair or bedside chair)?: A Little Help needed to walk in hospital room?: A Little Help needed climbing 3-5 steps with a railing? : A Lot 6 Click Score: 18    End of Session Equipment Utilized During Treatment: Gait belt;Oxygen Activity Tolerance: Patient  tolerated treatment well Patient left: in chair;with call bell/phone within reach;with family/visitor present Nurse Communication: Mobility status;Other (comment) (incr O2 to 6L with activity) PT Visit Diagnosis: Other abnormalities of gait and mobility (R26.89);Other (comment) (Decr functional capacity)    Time: 9381-0175 PT Time Calculation (min) (ACUTE ONLY): 38 min   Charges:   PT Evaluation $PT Eval Moderate Complexity: 1 Mod PT Treatments $Gait Training: 8-22 mins $Therapeutic Activity: 8-22 mins        Van Clines, PT  Acute Rehabilitation Services Pager [phone] Office [phone]   Levi Aland 02/18/2021, 2:19 PM

## 2021-02-18 NOTE — Progress Notes (Signed)
PROGRESS NOTE    [name]  NIO:[id number] DOB: [birth year] DOA: 02/16/2021 PCP: [name], MD    Brief Narrative:  [name] is a 55 y.o. female with medical history significant for severe COPD, chronic hypoxic and hypercapnic respiratory failure on 4 L upon O2 via [place] at all times, OSA nonadherent to BiPAP, severe AS and consideration for TAVR, hypertension, hyperlipidemia, mild nonobstructive CAD, GERD, depression/anxiety, and chronic pain who was brought to the ED for evaluation of respiratory distress.  History is limited from patient due to excessive somnolence and is otherwise obtained from EDP, chart review, and husband at bedside.  Patient had sudden onset of severe shortness of breath earlier today while at rest.  She became very somnolent and difficult to arouse.  EMS were called and on their arrival SPO2 was in the low 80s.  She was placed on 12 L NRB.  On route to the ED she was given 125 mg Solu-Medrol, 2 g magnesium, and albuterol and Atrovent treatments.   Husband states that she has not been adherent to BiPAP nightly due to intolerance.   In the ED, BP 108/71, pulse 95, RR 24, temp 98.5 F, SPO2 98% on 15 L NRB.  Patient subsequently placed on BiPAP. WBC 32.1, hemoglobin 11.3, platelets 293,000, sodium 143, potassium 3.5, chloride 93, bicarb 41, BUN 5, creatinine 0.53, serum glucose 123, high-sensitivity troponin I 23, lactic acid 1.1, BNP 86.7, i-STAT beta-hCG 21.1. VBG showed pH 7.312, PCO2 87.7, PO2 31.0.  Repeat VBG after time on BiPAP showed pH 7.355, PCO2 of 79.3, PO2 69. COVID-19/FLU A/B PCR negative. Portable chest x-ray showed emphysematous changes without focal consolidation, edema, or effusion. Patient was given continuous albuterol and Atrovent nebulizer treatments.  The hospitalist service was consulted to admit for further evaluation and management.   Assessment & Plan:   Principal Problem:   Acute on chronic respiratory failure with hypoxia and  hypercapnia (HCC) Active Problems:   COPD exacerbation (HCC)   Essential hypertension   Sleep apnea   Aortic stenosis  Acute metabolic encephalopathy: Resolved Secondary to hypercapnia/CO2 narcosis.  Continue management as below  Acute on chronic hypoxic and hypercapnic respiratory failure, POA COPD exacerbation On 4 L O2 via [place] at baseline, nonadherent to BiPAP.  Worsening hypercapnia from baseline likely multifactorial from COPD and untreated OSA/OHS.  Cova-19 PCR and influenza A/B PCR negative.  BNP 86.7.  Chest x-ray with findings of COPD, otherwise no other acute cardiopulmonary disease process. --Solu-Medrol 40 mg IV every 12 hours; transition to prednisone 50mg  PO daily tomorrow --Scheduled DuoNebs with as needed albuterol nebulizers --Continues supplemental oxygen, maintain SPO2 greater than 88%, on 4 L nasal cannula which is her home baseline --BiPAP mandatory nightly and as needed during the day   Leukocytosis: Suspect related to steroids given prior to arrival.  No obvious infection.   --WBC 29.8>24.1 --CBC daily   Hypertension: --BP 137/87, stable.  On losartan-HCTZ 100-12.5 mg p.o. daily at home. --Continue to hold home antihypertensives  --Continue monitor BP closely   Severe aortic stenosis: Following with [facility] cardiology who are considering TAVR.   Depression/anxiety: --Resume home Xanax   Chronic pain: On chronic morphine, Norco, Cymbalta and gabapentin at home.   --Continue to hold morphine, Cymbalta --Resume home Norco --resume home gabapentin  OSA/OHS: Patient is apparently not adherent to her home BiPAP. --Continue BiPAP while sleeping, and nightly  Weakness/deconditioning/debility: Patient lives at home with spouse.  Utilizes walker at baseline.  Feels more weak  and fatigued. --PT recommends home health    DVT prophylaxis: enoxaparin (LOVENOX) injection 40 mg Start: 02/17/21 1000    Code Status: Full Code Family Communication: No  family present at bedside this morning  Disposition Plan:  Level of care: Progressive Status is: Inpatient  Remains inpatient appropriate because:Altered mental status, Ongoing diagnostic testing needed not appropriate for outpatient work up, Unsafe d/c plan, IV treatments appropriate due to intensity of illness or inability to take PO, and Inpatient level of care appropriate due to severity of illness  Dispo: The patient is from: Home              Anticipated d/c is to: Home              Patient currently is not medically stable to d/c.   Difficult to place patient No  Consultants:  None  Procedures:  None  Antimicrobials:  None   Subjective: Patient seen examined at bedside, resting comfortably.  Nursing reports patient wore BiPAP for 5 hours overnight.  Continues on 4 L nasal cannula.  Mentation now back to her normal baseline.  No specific complaints or concerns at this time.  No family present at bedside. Denies headache, no dizziness, no chest pain, no palpitations, no abdominal pain.  No acute concerns overnight per nursing staff.  Objective: Vitals:   02/18/21 0733 02/18/21 0837 02/18/21 0930 02/18/21 1106  BP:      Pulse:  97 88   Resp:  15 16   Temp:      TempSrc:      SpO2: 97% 92% 95% 93%  Weight:      Height:        Intake/Output Summary (Last 24 hours) at 02/18/2021 1439 Last data filed at 02/17/2021 2236 Gross per 24 hour  Intake 243 ml  Output 1000 ml  Net -757 ml   Filed Weights   02/16/21 2209 02/17/21 0531 02/18/21 0435  Weight: 79.4 kg 80.4 kg 80.7 kg    Examination:  General exam: Chronically ill appearance, calm, appears older than stated age Respiratory system: Breath sounds slight diminished with expiratory wheezing, normal respiratory effort without accessory muscle use, on 4 L nasal cannula which is her baseline Cardiovascular system: S1 & S2 heard, RRR. No JVD, murmurs, rubs, gallops or clicks. No pedal edema. Gastrointestinal system:  Abdomen is nondistended, soft and nontender. No organomegaly or masses felt. Normal bowel sounds heard. Central nervous system: Alert and oriented. No focal neurological deficits. Extremities: Symmetric 5 x 5 power. Skin: No rashes, lesions or ulcers Psychiatry: Judgement and insight appear poor. Mood & affect appropriate.     Data Reviewed: I have personally reviewed following labs and imaging studies  CBC: Recent Labs  Lab 02/16/21 1735 02/16/21 1856 02/16/21 2023 02/17/21 0236 02/18/21 0137  WBC 32.1*  --   --  29.8* 24.1*  NEUTROABS 30.5*  --   --   --   --   HGB 11.3* 11.9* 12.2 11.2* 10.7*  HCT 39.6 35.0* 36.0 37.9 36.3  MCV 90.6  --   --  88.1 86.2  PLT 293  --   --  290 302   Basic Metabolic Panel: Recent Labs  Lab 02/16/21 1735 02/16/21 1856 02/16/21 2023 02/17/21 0236 02/18/21 0137  NA 143 140 140 141 138  K 3.5 3.3* 3.7 4.5 3.8  CL 93*  --   --  94* 98  CO2 41*  --   --  37* 33*  GLUCOSE 123*  --   --  184* 140*  BUN 5*  --   --  7 11  CREATININE 0.53  --   --  0.63 0.45  CALCIUM 9.0  --   --  8.6* 8.7*   GFR: Estimated Creatinine Clearance: 79.9 mL/min (by C-G formula based on SCr of 0.45 mg/dL). Liver Function Tests: Recent Labs  Lab 02/16/21 1735  AST 12*  ALT 15  ALKPHOS 63  BILITOT 0.6  PROT 6.2*  ALBUMIN 3.5   No results for input(s): LIPASE, AMYLASE in the last 168 hours. No results for input(s): AMMONIA in the last 168 hours. Coagulation Profile: No results for input(s): INR, PROTIME in the last 168 hours. Cardiac Enzymes: No results for input(s): CKTOTAL, CKMB, CKMBINDEX, TROPONINI in the last 168 hours. BNP (last 3 results) No results for input(s): PROBNP in the last 8760 hours. HbA1C: No results for input(s): HGBA1C in the last 72 hours. CBG: No results for input(s): GLUCAP in the last 168 hours. Lipid Profile: No results for input(s): CHOL, HDL, LDLCALC, TRIG, CHOLHDL, LDLDIRECT in the last 72 hours. Thyroid Function  Tests: No results for input(s): TSH, T4TOTAL, FREET4, T3FREE, THYROIDAB in the last 72 hours. Anemia Panel: No results for input(s): VITAMINB12, FOLATE, FERRITIN, TIBC, IRON, RETICCTPCT in the last 72 hours. Sepsis Labs: Recent Labs  Lab 02/16/21 1715  LATICACIDVEN 1.1    Recent Results (from the past 240 hour(s))  Resp Panel by RT-PCR (Flu A&B, Covid) Nasopharyngeal Swab     Status: None   Collection Time: 02/16/21  9:07 PM   Specimen: Nasopharyngeal Swab; Nasopharyngeal(NP) swabs in vial transport medium  Result Value Ref Range Status   SARS Coronavirus 2 by RT PCR NEGATIVE NEGATIVE Final    Comment: (NOTE) SARS-CoV-2 target nucleic acids are NOT DETECTED.  The SARS-CoV-2 RNA is generally detectable in upper respiratory specimens during the acute phase of infection. The lowest concentration of SARS-CoV-2 viral copies this assay can detect is 138 copies/mL. A negative result does not preclude SARS-Cov-2 infection and should not be used as the sole basis for treatment or other patient management decisions. A negative result may occur with  improper specimen collection/handling, submission of specimen other than nasopharyngeal swab, presence of viral mutation(s) within the areas targeted by this assay, and inadequate number of viral copies(<138 copies/mL). A negative result must be combined with clinical observations, patient history, and epidemiological information. The expected result is Negative.  Fact Sheet for Patients:  BloggerCourse.comhttps://www.fda.gov/media/152166/download  Fact Sheet for Healthcare Providers:  SeriousBroker.ithttps://www.fda.gov/media/152162/download  This test is no t yet approved or cleared by the [place] FDA and  has been authorized for detection and/or diagnosis of SARS-CoV-2 by FDA under an Emergency Use Authorization (EUA). This EUA will remain  in effect (meaning this test can be used) for the duration of the COVID-19 declaration under Section 564(b)(1) of the Act,  21 U.S.C.section 360bbb-3(b)(1), unless the authorization is terminated  or revoked sooner.       Influenza A by PCR NEGATIVE NEGATIVE Final   Influenza B by PCR NEGATIVE NEGATIVE Final    Comment: (NOTE) The Xpert Xpress SARS-CoV-2/FLU/RSV plus assay is intended as an aid in the diagnosis of influenza from Nasopharyngeal swab specimens and should not be used as a sole basis for treatment. Nasal washings and aspirates are unacceptable for Xpert Xpress SARS-CoV-2/FLU/RSV testing.  Fact Sheet for Patients: BloggerCourse.comhttps://www.fda.gov/media/152166/download  Fact Sheet for Healthcare Providers: SeriousBroker.ithttps://www.fda.gov/media/152162/download  This test is not yet approved or cleared by the [place] FDA and has been authorized  for detection and/or diagnosis of SARS-CoV-2 by FDA under an Emergency Use Authorization (EUA). This EUA will remain in effect (meaning this test can be used) for the duration of the COVID-19 declaration under Section 564(b)(1) of the Act, 21 U.S.C. section 360bbb-3(b)(1), unless the authorization is terminated or revoked.  Performed at [facility], [address], [place], [place] [address]          Radiology Studies: DG Chest Port 1 View  Result Date: 02/16/2021 CLINICAL DATA:  Respiratory distress. EXAM: PORTABLE CHEST 1 VIEW COMPARISON:  Chest x-ray dated November 12, 2020. FINDINGS: The heart size and mediastinal contours are within normal limits. Emphysematous changes again noted. Chronically coarsened interstitial markings, worse at the lung bases. No focal consolidation, pleural effusion, or pneumothorax. No acute osseous abnormality. IMPRESSION: 1. COPD. No active disease. Electronically Signed   By: Obie Dredge M.D.   On: 02/16/2021 18:28        Scheduled Meds:  albuterol  15 mg/hr Nebulization Once   aspirin EC  81 mg Oral Daily   chlorhexidine  15 mL Mouth Rinse BID   enoxaparin (LOVENOX) injection  40 mg Subcutaneous Q24H   fluticasone  furoate-vilanterol  1 puff Inhalation Daily   gabapentin  400 mg Oral TID   ipratropium  0.5 mg Nebulization Once   ipratropium-albuterol  3 mL Nebulization Q4H   mouth rinse  15 mL Mouth Rinse q12n4p   methylPREDNISolone (SOLU-MEDROL) injection  40 mg Intravenous Q12H   montelukast  10 mg Oral QHS   [START ON 02/19/2021] predniSONE  50 mg Oral Q breakfast   sodium chloride flush  3 mL Intravenous Q12H   umeclidinium bromide  1 puff Inhalation Daily   Continuous Infusions:   LOS: 2 days    Time spent: 38 minutes spent on chart review, discussion with nursing staff, consultants, updating family and interview/physical exam; more than 50% of that time was spent in counseling and/or coordination of care.    Alvira Philips [place], DO [facility] Available via Epic secure chat 7am-7pm After these hours, please refer to coverage provider listed on [email or web address] 02/18/2021, 2:39 PM

## 2021-02-19 LAB — CBC
HCT: 37.4 % (ref 36.0–46.0)
Hemoglobin: 11.2 g/dL — ABNORMAL LOW (ref 12.0–15.0)
MCH: 25.6 pg — ABNORMAL LOW (ref 26.0–34.0)
MCHC: 29.9 g/dL — ABNORMAL LOW (ref 30.0–36.0)
MCV: 85.4 fL (ref 80.0–100.0)
Platelets: 342 10*3/uL (ref 150–400)
RBC: 4.38 MIL/uL (ref 3.87–5.11)
RDW: 16.7 % — ABNORMAL HIGH (ref 11.5–15.5)
WBC: 21.5 10*3/uL — ABNORMAL HIGH (ref 4.0–10.5)
nRBC: 0 % (ref 0.0–0.2)

## 2021-02-19 LAB — BASIC METABOLIC PANEL
Anion gap: 8 (ref 5–15)
BUN: 12 mg/dL (ref 6–20)
CO2: 35 mmol/L — ABNORMAL HIGH (ref 22–32)
Calcium: 9 mg/dL (ref 8.9–10.3)
Chloride: 97 mmol/L — ABNORMAL LOW (ref 98–111)
Creatinine, Ser: 0.56 mg/dL (ref 0.44–1.00)
GFR, Estimated: >60 mL/min (ref >60)
Glucose, Bld: 97 mg/dL (ref 70–99)
Potassium: 3.3 mmol/L — ABNORMAL LOW (ref 3.5–5.1)
Sodium: 140 mmol/L (ref 135–145)

## 2021-02-19 MED ORDER — ALPRAZOLAM 0.5 MG PO TABS: 0.5000 mg | ORAL_TABLET | Freq: Every day | ORAL | 0 refills | Status: AC

## 2021-02-19 MED ORDER — POTASSIUM CHLORIDE CRYS ER 20 MEQ PO TBCR
40.0000 meq | EXTENDED_RELEASE_TABLET | Freq: Once | ORAL | Status: AC
  Administered 2021-02-19: 40 meq via ORAL
  Filled 2021-02-19: qty 2

## 2021-02-19 MED ORDER — PREDNISONE 10 MG PO TABS: ORAL_TABLET | ORAL | 0 refills | Status: DC

## 2021-02-19 NOTE — Plan of Care (Signed)

## 2021-02-19 NOTE — Discharge Summary (Signed)
Physician Discharge Summary  [name] ZOX:[id number] DOB: September 01, 1966 DOA: 02/16/2021  PCP: [name], MD  Admit date: 02/16/2021 Discharge date: 02/19/2021  Admitted From: Home Disposition: Home  Recommendations for Outpatient Follow-up:  Follow up with PCP in 1-2 weeks Continue prednisone taper on discharge Please enforce compliance with home BiPAP, started on Xanax 0.5 mg p.o. nightly for anxiety while using mask; as this is a contributing factor to her presentation with altered mental status due to CO2 narcosis. Outpatient follow-up with cardiology for further work-up of aortic stenosis  Home Health: PT Equipment/Devices: None  Discharge Condition: Stable CODE STATUS: Full code Diet recommendation: Heart healthy diet  History of present illness:  [name] is a 55 y.o. female with medical history significant for severe COPD, chronic hypoxic and hypercapnic respiratory failure on 4 L upon O2 via [place] at all times, OSA nonadherent to BiPAP, severe AS and consideration for TAVR, hypertension, hyperlipidemia, mild nonobstructive CAD, GERD, depression/anxiety, and chronic pain who was brought to the ED for evaluation of respiratory distress.  History is limited from patient due to excessive somnolence and is otherwise obtained from EDP, chart review, and husband at bedside.  Patient had sudden onset of severe shortness of breath earlier today while at rest.  She became very somnolent and difficult to arouse.  EMS were called and on their arrival SPO2 was in the low 80s.  She was placed on 12 L NRB.  On route to the ED she was given 125 mg Solu-Medrol, 2 g magnesium, and albuterol and Atrovent treatments.   Husband states that she has not been adherent to BiPAP nightly due to intolerance.   In the ED, BP 108/71, pulse 95, RR 24, temp 98.5 F, SPO2 98% on 15 L NRB.  Patient subsequently placed on BiPAP. WBC 32.1, hemoglobin 11.3, platelets 293,000, sodium 143, potassium 3.5,  chloride 93, bicarb 41, BUN 5, creatinine 0.53, serum glucose 123, high-sensitivity troponin I 23, lactic acid 1.1, BNP 86.7, i-STAT beta-hCG 21.1. VBG showed pH 7.312, PCO2 87.7, PO2 31.0.  Repeat VBG after time on BiPAP showed pH 7.355, PCO2 of 79.3, PO2 69. COVID-19/FLU A/B PCR negative. Portable chest x-ray showed emphysematous changes without focal consolidation, edema, or effusion. Patient was given continuous albuterol and Atrovent nebulizer treatments.  The hospitalist service was consulted to admit for further evaluation and management.  Hospital course:  Acute metabolic encephalopathy: Resolved Secondary to hypercapnia/CO2 narcosis.  Continue management as below   Acute on chronic hypoxic and hypercapnic respiratory failure, POA COPD exacerbation On 4 L O2 via  at baseline, nonadherent to BiPAP.  Worsening hypercapnia from baseline likely multifactorial from COPD and untreated OSA/OHS.  Cova-19 PCR and influenza A/B PCR negative.  BNP 86.7.  Chest x-ray with findings of COPD, otherwise no other acute cardiopulmonary disease process.  Patient was initially requiring continuous BiPAP and titrated off during the day to her normal 4 L nasal cannula.  Patient also was initially started on IV Solu-Medrol which was transitioned to prednisone.  She continue duo nebs and albuterol nebs as needed.  Patient continued her Breztri inhaler with possible substitutions while inpatient.  Patient's mental status now improved and back to her normal baseline.  Discussed with patient extensively during the hospitalization needs compliance with nocturnal BiPAP to avoid recurrent episodes in the future.  Patient states she gets very anxious with the mask at nighttime and did much better with Xanax 0.5 mg p.o. nightly.  Will discharge on prednisone taper.  Outpatient follow-up with PCP.   Leukocytosis: Suspect related to steroids given prior to arrival.  No obvious infection.  WBC 29.8 on arrival, trended down to  21.5 at time of discharge.  Recommend repeat CBC 1 week.   Hypertension: On losartan-HCTZ 100-12.5 mg p.o. daily at home; which was being held by PCP.  Continue home Lasix.  Outpatient follow-up with PCP.   Severe aortic stenosis: Following with [facility] cardiology who are considering TAVR.   Depression/anxiety: Xanax nightly for anxiety with BiPAP   Chronic pain: On chronic morphine, Norco, Cymbalta and gabapentin at home.     OSA/OHS: Patient is apparently not adherent to her home BiPAP.  Continue encourage compliance with nocturnal BiPAP.   Weakness/deconditioning/debility: Patient lives at home with spouse.  Utilizes walker at baseline.  Feels more weak and fatigued.  Evaluated by physical therapy with recommendations of home health.  Obesity Body mass index is 30.11 kg/m.  Discussed with patient needs for aggressive lifestyle changes/weight loss as this complicates all facets of care.  Outpatient follow-up with PCP.  Discharge Diagnoses:  Principal Problem:   Acute on chronic respiratory failure with hypoxia and hypercapnia (HCC) Active Problems:   COPD exacerbation (HCC)   Essential hypertension   Sleep apnea   Aortic stenosis    Discharge Instructions  Discharge Instructions     Call MD for:  difficulty breathing, headache or visual disturbances   Complete by: As directed    Call MD for:  extreme fatigue   Complete by: As directed    Call MD for:  persistant dizziness or light-headedness   Complete by: As directed    Call MD for:  persistant nausea and vomiting   Complete by: As directed    Call MD for:  severe uncontrolled pain   Complete by: As directed    Call MD for:  temperature >100.4   Complete by: As directed    Diet - low sodium heart healthy   Complete by: As directed    Increase activity slowly   Complete by: As directed    No wound care   Complete by: As directed       Allergies as of 02/19/2021       Reactions   Amoxicillin-pot  Clavulanate Itching   Ok with benadryl Did it involve swelling of the face/tongue/throat, SOB, or low BP? No Did it involve sudden or severe rash/hives, skin peeling, or any reaction on the inside of your mouth or nose? No Did you need to seek medical attention at a hospital or doctor's office? No When did it last happen?      6 months If all above answers are "NO", may proceed with cephalosporin use.   Erythromycin Rash   Ok with benadryl    Moxifloxacin Itching   Reaction to Avelox - ok with benadryl   Sulfamethoxazole-trimethoprim Itching   Ok with benadryl        Medication List     STOP taking these medications    doxycycline 100 MG tablet Commonly known as: ADOXA   levofloxacin 500 MG tablet Commonly known as: LEVAQUIN   losartan-hydrochlorothiazide 100-12.5 MG tablet Commonly known as: HYZAAR       TAKE these medications    albuterol (2.5 MG/3ML) 0.083% nebulizer solution Commonly known as: PROVENTIL Inhale 3 mLs into the lungs every 6 (six) hours as needed for wheezing or shortness of breath.   ALPRAZolam 0.5 MG tablet Commonly known as: XANAX Take 1 tablet (0.5 mg total)  by mouth at bedtime. What changed:  when to take this reasons to take this   ascorbic acid 500 MG tablet Commonly known as: VITAMIN C Take 1 tablet (500 mg total) by mouth daily.   aspirin EC 81 MG tablet Take 81 mg by mouth daily. Swallow whole.   Breztri Aerosphere 160-9-4.8 MCG/ACT Aero Generic drug: Budeson-Glycopyrrol-Formoterol Inhale 2 puffs into the lungs in the morning and at bedtime.   cetirizine 10 MG tablet Commonly known as: ZYRTEC Take 10 mg daily by mouth.   COLLAGEN PO Take 1,000 mg by mouth 3 (three) times daily.   dextromethorphan-guaiFENesin 30-600 MG 12hr tablet Commonly known as: MUCINEX DM Take 1 tablet by mouth 2 (two) times daily as needed for cough.   DULoxetine 30 MG capsule Commonly known as: CYMBALTA Take 30 mg by mouth daily.   fluticasone  50 MCG/ACT nasal spray Commonly known as: FLONASE Place 1 spray into both nostrils daily.   furosemide 20 MG tablet Commonly known as: Lasix Take 1 tablet (20 mg total) by mouth daily as needed for edema (weight gain of 3lbs in 1 day or 5 lbs in 2 days.). What changed: when to take this   gabapentin 400 MG capsule Commonly known as: NEURONTIN Take 400 mg by mouth 3 (three) times daily.   HYDROcodone-acetaminophen 10-325 MG tablet Commonly known as: NORCO Take 1 tablet by mouth every 6 (six) hours as needed (breakthrough pain). What changed: when to take this   montelukast 10 MG tablet Commonly known as: SINGULAIR Take 10 mg at bedtime by mouth.   morphine 30 MG 12 hr tablet Commonly known as: MS CONTIN Take 30 mg by mouth every 12 (twelve) hours.   nicotine 21 mg/24hr patch Commonly known as: NICODERM CQ - dosed in mg/24 hours Place 21 mg onto the skin daily.   nystatin-triamcinolone ointment Commonly known as: MYCOLOG Apply 1 application topically 2 (two) times daily as needed (rash).   OXYGEN Inhale 4 L into the lungs continuous.   pantoprazole 40 MG tablet Commonly known as: PROTONIX Take 40 mg by mouth 2 (two) times daily.   polyethylene glycol powder 17 GM/SCOOP powder Commonly known as: GLYCOLAX/MIRALAX Take 17 g by mouth daily as needed for mild constipation.   predniSONE 10 MG tablet Commonly known as: DELTASONE Take 5 tablets (50 mg total) by mouth daily for 3 days, THEN 4 tablets (40 mg total) daily for 3 days, THEN 2 tablets (20 mg total) daily for 3 days, THEN 1 tablet (10 mg total) daily for 3 days. Start taking on: February 20, 2021 What changed:  See the new instructions. Another medication with the same name was removed. Continue taking this medication, and follow the directions you see here.   senna-docusate 8.6-50 MG tablet Commonly known as: Senokot-S Take 2 tablets by mouth 2 (two) times daily.   traZODone 50 MG tablet Commonly known as:  DESYREL Take 1 tablet (50 mg total) by mouth at bedtime as needed for sleep.   Vitamin D (Ergocalciferol) 1.25 MG (50000 UNIT) Caps capsule Commonly known as: DRISDOL Take 50,000 Units by mouth every Monday.   zinc sulfate 220 (50 Zn) MG capsule Take 1 capsule (220 mg total) by mouth daily.        Follow-up Information     [name], MD. Schedule an appointment as soon as possible for a visit in 1 week(s).   Specialty: Family Medicine Contact information: [address] [place] [address] [phone]  Allergies  Allergen Reactions   Amoxicillin-Pot Clavulanate Itching    Ok with benadryl Did it involve swelling of the face/tongue/throat, SOB, or low BP? No Did it involve sudden or severe rash/hives, skin peeling, or any reaction on the inside of your mouth or nose? No Did you need to seek medical attention at a hospital or doctor's office? No When did it last happen?      6 months If all above answers are "NO", may proceed with cephalosporin use.    Erythromycin Rash    Ok with benadryl    Moxifloxacin Itching    Reaction to Avelox - ok with benadryl   Sulfamethoxazole-Trimethoprim Itching    Ok with benadryl    Consultations: None   Procedures/Studies: DG Chest Port 1 View  Result Date: 02/16/2021 CLINICAL DATA:  Respiratory distress. EXAM: PORTABLE CHEST 1 VIEW COMPARISON:  Chest x-ray dated November 12, 2020. FINDINGS: The heart size and mediastinal contours are within normal limits. Emphysematous changes again noted. Chronically coarsened interstitial markings, worse at the lung bases. No focal consolidation, pleural effusion, or pneumothorax. No acute osseous abnormality. IMPRESSION: 1. COPD. No active disease. Electronically Signed   By: Obie Dredge M.D.   On: 02/16/2021 18:28     Subjective:   Discharge Exam: Vitals:   02/19/21 0843 02/19/21 0914  BP: (!) 153/89   Pulse: 94   Resp: (!) 23   Temp:    SpO2: 95%  94%   Vitals:   02/19/21 0352 02/19/21 0543 02/19/21 0843 02/19/21 0914  BP:  (!) 146/93 (!) 153/89   Pulse: 73 71 94   Resp: 20 19 (!) 23   Temp:  98.1 F (36.7 C)    TempSrc:  Axillary    SpO2: 97% 97% 95% 94%  Weight:  77.1 kg    Height:        General: Pt is alert, awake, not in acute distress, chronically ill in appearance, obese Cardiovascular: RRR, S1/S2 +, no rubs, no gallops Respiratory: CTA bilaterally, no wheezing, no rhonchi, on 4 L nasal cannula with SPO2 98% which is her normal baseline Abdominal: Soft, NT, ND, bowel sounds + Extremities: no edema, no cyanosis    The results of significant diagnostics from this hospitalization (including imaging, microbiology, ancillary and laboratory) are listed below for reference.     Microbiology: Recent Results (from the past 240 hour(s))  Resp Panel by RT-PCR (Flu A&B, Covid) Nasopharyngeal Swab     Status: None   Collection Time: 02/16/21  9:07 PM   Specimen: Nasopharyngeal Swab; Nasopharyngeal(NP) swabs in vial transport medium  Result Value Ref Range Status   SARS Coronavirus 2 by RT PCR NEGATIVE NEGATIVE Final    Comment: (NOTE) SARS-CoV-2 target nucleic acids are NOT DETECTED.  The SARS-CoV-2 RNA is generally detectable in upper respiratory specimens during the acute phase of infection. The lowest concentration of SARS-CoV-2 viral copies this assay can detect is 138 copies/mL. A negative result does not preclude SARS-Cov-2 infection and should not be used as the sole basis for treatment or other patient management decisions. A negative result may occur with  improper specimen collection/handling, submission of specimen other than nasopharyngeal swab, presence of viral mutation(s) within the areas targeted by this assay, and inadequate number of viral copies(<138 copies/mL). A negative result must be combined with clinical observations, patient history, and epidemiological information. The expected result is  Negative.  Fact Sheet for Patients:  BloggerCourse.com  Fact Sheet for Healthcare Providers:  SeriousBroker.it  This  test is no t yet approved or cleared by the [place] FDA and  has been authorized for detection and/or diagnosis of SARS-CoV-2 by FDA under an Emergency Use Authorization (EUA). This EUA will remain  in effect (meaning this test can be used) for the duration of the COVID-19 declaration under Section 564(b)(1) of the Act, 21 U.S.C.section 360bbb-3(b)(1), unless the authorization is terminated  or revoked sooner.       Influenza A by PCR NEGATIVE NEGATIVE Final   Influenza B by PCR NEGATIVE NEGATIVE Final    Comment: (NOTE) The Xpert Xpress SARS-CoV-2/FLU/RSV plus assay is intended as an aid in the diagnosis of influenza from Nasopharyngeal swab specimens and should not be used as a sole basis for treatment. Nasal washings and aspirates are unacceptable for Xpert Xpress SARS-CoV-2/FLU/RSV testing.  Fact Sheet for Patients: BloggerCourse.comhttps://www.fda.gov/media/152166/download  Fact Sheet for Healthcare Providers: SeriousBroker.ithttps://www.fda.gov/media/152162/download  This test is not yet approved or cleared by the [place] FDA and has been authorized for detection and/or diagnosis of SARS-CoV-2 by FDA under an Emergency Use Authorization (EUA). This EUA will remain in effect (meaning this test can be used) for the duration of the COVID-19 declaration under Section 564(b)(1) of the Act, 21 U.S.C. section 360bbb-3(b)(1), unless the authorization is terminated or revoked.  Performed at [facility], [address] [place], [place] [address]      Labs: BNP (last 3 results) Recent Labs    09/06/20 0555 11/11/20 1216 02/16/21 1715  BNP 203.6* 102.0* 86.7   Basic Metabolic Panel: Recent Labs  Lab 02/16/21 1735 02/16/21 1856 02/16/21 2023 02/17/21 0236 02/18/21 0137 02/19/21 0558  NA 143 140 140 141 138 140   K 3.5 3.3* 3.7 4.5 3.8 3.3*  CL 93*  --   --  94* 98 97*  CO2 41*  --   --  37* 33* 35*  GLUCOSE 123*  --   --  184* 140* 97  BUN 5*  --   --  7 11 12   CREATININE 0.53  --   --  0.63 0.45 0.56  CALCIUM 9.0  --   --  8.6* 8.7* 9.0   Liver Function Tests: Recent Labs  Lab 02/16/21 1735  AST 12*  ALT 15  ALKPHOS 63  BILITOT 0.6  PROT 6.2*  ALBUMIN 3.5   No results for input(s): LIPASE, AMYLASE in the last 168 hours. No results for input(s): AMMONIA in the last 168 hours. CBC: Recent Labs  Lab 02/16/21 1735 02/16/21 1856 02/16/21 2023 02/17/21 0236 02/18/21 0137 02/19/21 0558  WBC 32.1*  --   --  29.8* 24.1* 21.5*  NEUTROABS 30.5*  --   --   --   --   --   HGB 11.3* 11.9* 12.2 11.2* 10.7* 11.2*  HCT 39.6 35.0* 36.0 37.9 36.3 37.4  MCV 90.6  --   --  88.1 86.2 85.4  PLT 293  --   --  290 302 342   Cardiac Enzymes: No results for input(s): CKTOTAL, CKMB, CKMBINDEX, TROPONINI in the last 168 hours. BNP: Invalid input(s): POCBNP CBG: No results for input(s): GLUCAP in the last 168 hours. D-Dimer No results for input(s): DDIMER in the last 72 hours. Hgb A1c No results for input(s): HGBA1C in the last 72 hours. Lipid Profile No results for input(s): CHOL, HDL, LDLCALC, TRIG, CHOLHDL, LDLDIRECT in the last 72 hours. Thyroid function studies No results for input(s): TSH, T4TOTAL, T3FREE, THYROIDAB in the last 72 hours.  Invalid input(s):  FREET3 Anemia work up No results for input(s): VITAMINB12, FOLATE, FERRITIN, TIBC, IRON, RETICCTPCT in the last 72 hours. Urinalysis    Component Value Date/Time   COLORURINE STRAW (A) 02/15/2019 1309   APPEARANCEUR CLEAR 02/15/2019 1309   LABSPEC 1.002 (L) 02/15/2019 1309   PHURINE 7.0 02/15/2019 1309   GLUCOSEU NEGATIVE 02/15/2019 1309   HGBUR NEGATIVE 02/15/2019 1309   BILIRUBINUR NEGATIVE 02/15/2019 1309   KETONESUR NEGATIVE 02/15/2019 1309   PROTEINUR NEGATIVE 02/15/2019 1309   UROBILINOGEN 0.2 03/31/2012 1338   NITRITE  NEGATIVE 02/15/2019 1309   LEUKOCYTESUR NEGATIVE 02/15/2019 1309   Sepsis Labs Invalid input(s): PROCALCITONIN,  WBC,  LACTICIDVEN Microbiology Recent Results (from the past 240 hour(s))  Resp Panel by RT-PCR (Flu A&B, Covid) Nasopharyngeal Swab     Status: None   Collection Time: 02/16/21  9:07 PM   Specimen: Nasopharyngeal Swab; Nasopharyngeal(NP) swabs in vial transport medium  Result Value Ref Range Status   SARS Coronavirus 2 by RT PCR NEGATIVE NEGATIVE Final    Comment: (NOTE) SARS-CoV-2 target nucleic acids are NOT DETECTED.  The SARS-CoV-2 RNA is generally detectable in upper respiratory specimens during the acute phase of infection. The lowest concentration of SARS-CoV-2 viral copies this assay can detect is 138 copies/mL. A negative result does not preclude SARS-Cov-2 infection and should not be used as the sole basis for treatment or other patient management decisions. A negative result may occur with  improper specimen collection/handling, submission of specimen other than nasopharyngeal swab, presence of viral mutation(s) within the areas targeted by this assay, and inadequate number of viral copies(<138 copies/mL). A negative result must be combined with clinical observations, patient history, and epidemiological information. The expected result is Negative.  Fact Sheet for Patients:  BloggerCourse.com  Fact Sheet for Healthcare Providers:  SeriousBroker.it  This test is no t yet approved or cleared by the [place] FDA and  has been authorized for detection and/or diagnosis of SARS-CoV-2 by FDA under an Emergency Use Authorization (EUA). This EUA will remain  in effect (meaning this test can be used) for the duration of the COVID-19 declaration under Section 564(b)(1) of the Act, 21 U.S.C.section 360bbb-3(b)(1), unless the authorization is terminated  or revoked sooner.       Influenza A by PCR NEGATIVE  NEGATIVE Final   Influenza B by PCR NEGATIVE NEGATIVE Final    Comment: (NOTE) The Xpert Xpress SARS-CoV-2/FLU/RSV plus assay is intended as an aid in the diagnosis of influenza from Nasopharyngeal swab specimens and should not be used as a sole basis for treatment. Nasal washings and aspirates are unacceptable for Xpert Xpress SARS-CoV-2/FLU/RSV testing.  Fact Sheet for Patients: BloggerCourse.com  Fact Sheet for Healthcare Providers: SeriousBroker.it  This test is not yet approved or cleared by the [place] FDA and has been authorized for detection and/or diagnosis of SARS-CoV-2 by FDA under an Emergency Use Authorization (EUA). This EUA will remain in effect (meaning this test can be used) for the duration of the COVID-19 declaration under Section 564(b)(1) of the Act, 21 U.S.C. section 360bbb-3(b)(1), unless the authorization is terminated or revoked.  Performed at [facility], [address], [place], [place] [address]      Time coordinating discharge: Over 30 minutes  SIGNED:   Alvira Philips [place], DO  [facility] 02/19/2021, 10:04 AM

## 2021-02-19 NOTE — TOC Initial Note (Signed)
Transition of Care [facility]) - Initial/Assessment Note    Patient Details  Name: [name] MRN: [id number] Date of Birth: [birth year]  Transition of Care [facility]) CM/SW Contact:    Gala Lewandowsky, RN Phone Number: 02/19/2021, 11:36 AM  Clinical Narrative:  Case Manager spoke with the patient regarding home health services. Patient is currently active with Home Health PT and RN with [facility]. Case Manager called the agency to verify disciplines and to make them aware that the patient will transition home today. Patient had questions regarding a wheelchair- questions answered. Family will wait to secure a new wheelchair at this time. Husband at bedside and he will provide transport home via private vehicle.                  Expected Discharge Plan: Home w Home Health Services Barriers to Discharge: No Barriers Identified   Patient Goals and CMS Choice Patient states their goals for this hospitalization and ongoing recovery are:: to return home with home health services. CMS Medicare.gov Compare Post Acute Care list provided to:: Patient Choice offered to / list presented to : Patient  Expected Discharge Plan and Services Expected Discharge Plan: Home w Home Health Services In-house Referral: NA Discharge Planning Services: CM Consult Post Acute Care Choice: Home Health, Resumption of Svcs/PTA Provider Living arrangements for the past 2 months: Single Family Home Expected Discharge Date: 02/19/21                 DME Agency: NA       HH Arranged: PT, RN HH Agency: [facility] Date HH Agency Contacted: 02/19/21 Time HH Agency Contacted: 1015 Representative spoke with at [facility] Agency: Pearson Grippe  Prior Living Arrangements/Services Living arrangements for the past 2 months: Single Family Home Lives with:: Self, Spouse Patient language and need for interpreter reviewed:: Yes Do you feel safe going back to the place where you live?: Yes       Need for Family Participation in Patient Care: Yes (Comment) Care giver support system in place?: Yes (comment) Current home services: DME (Patient has home 02 and BIPAP via [facility] Criminal Activity/Legal Involvement Pertinent to Current Situation/Hospitalization: No - Comment as needed  Activities of Daily Living Home Assistive Devices/Equipment: Bedside commode/3-in-1, Walker (specify type), Wheelchair ADL Screening (condition at time of admission) Patient's cognitive ability adequate to safely complete daily activities?: Yes Is the patient deaf or have difficulty hearing?: No Does the patient have difficulty seeing, even when wearing glasses/contacts?: No Does the patient have difficulty concentrating, remembering, or making decisions?: No Patient able to express need for assistance with ADLs?: Yes Does the patient have difficulty dressing or bathing?: No Independently performs ADLs?: Yes (appropriate for developmental age) Does the patient have difficulty walking or climbing stairs?: Yes Weakness of Legs: Both Weakness of Arms/Hands: Both  Permission Sought/Granted Permission sought to share information with : Family Supports, Oceanographer granted to share information with : Yes, Verbal Permission Granted     Permission granted to share info w AGENCY: [facility]        Emotional Assessment Appearance:: Appears stated age Attitude/Demeanor/Rapport: Engaged Affect (typically observed): Appropriate Orientation: : Oriented to Situation, Oriented to  Time, Oriented to Place, Oriented to Self Alcohol / Substance Use: Not Applicable Psych Involvement: No (comment)  Admission diagnosis:  COPD exacerbation (HCC) [J44.1] Acute respiratory failure with hypoxia and hypercapnia (HCC) [J96.01, J96.02] Acute on chronic respiratory failure with hypoxia and hypercapnia (HCC) [K99.83,  J96.22] Patient Active Problem List   Diagnosis Date Noted    Acute on chronic respiratory failure with hypoxia and hypercapnia (HCC) 02/16/2021   New onset headache 12/14/2020   DNR (do not resuscitate) 11/13/2020   Acute respiratory failure (HCC)    Respiratory distress    COVID-19 11/11/2020   Aortic stenosis 09/07/2020   Acute on chronic heart failure with preserved ejection fraction (HFpEF) (HCC) 09/07/2020   Sleep apnea    Osteoarthritis of left hip 02/18/2019   Chronic respiratory failure with hypoxia (HCC) 05/20/2018   Chronic pain 05/20/2018   Tobacco abuse 05/20/2018   Essential hypertension    GERD (gastroesophageal reflux disease)    COPD (chronic obstructive pulmonary disease) (HCC)    Asthma    Obesity due to excess calories    Lumbar spinal stenosis 04/03/2012    Class: Diagnosis of   COPD exacerbation (HCC) 05/31/2010   PCP:  [name], MD Pharmacy:   [facility] [phone] - [place], [place] - [address] [place] [address] [facility] [address] [place] [address] Phone: [phone] Fax: [phone]  [facility] [place], [place] - [address] [place] [address] Phone: [phone] Fax: [phone]    Readmission Risk Interventions No flowsheet data found.

## 2021-02-19 NOTE — Plan of Care (Signed)
Problem: Education: Goal: Knowledge of General Education information will improve Description: Including pain rating scale, medication(s)/side effects and non-pharmacologic comfort measures 02/19/2021 1026 by Hinton Dyer, RN Outcome: Adequate for Discharge 02/19/2021 1016 by Hinton Dyer, RN Outcome: Adequate for Discharge   Problem: Health Behavior/Discharge Planning: Goal: Ability to manage health-related needs will improve 02/19/2021 1026 by Hinton Dyer, RN Outcome: Adequate for Discharge 02/19/2021 1016 by Hinton Dyer, RN Outcome: Adequate for Discharge   Problem: Clinical Measurements: Goal: Ability to maintain clinical measurements within normal limits will improve 02/19/2021 1026 by Hinton Dyer, RN Outcome: Adequate for Discharge 02/19/2021 1016 by Hinton Dyer, RN Outcome: Adequate for Discharge Goal: Will remain free from infection 02/19/2021 1026 by Hinton Dyer, RN Outcome: Adequate for Discharge 02/19/2021 1016 by Hinton Dyer, RN Outcome: Adequate for Discharge Goal: Diagnostic test results will improve 02/19/2021 1026 by Hinton Dyer, RN Outcome: Adequate for Discharge 02/19/2021 1016 by Hinton Dyer, RN Outcome: Adequate for Discharge Goal: Respiratory complications will improve 02/19/2021 1026 by Hinton Dyer, RN Outcome: Adequate for Discharge 02/19/2021 1016 by Hinton Dyer, RN Outcome: Adequate for Discharge Goal: Cardiovascular complication will be avoided 02/19/2021 1026 by Hinton Dyer, RN Outcome: Adequate for Discharge 02/19/2021 1016 by Hinton Dyer, RN Outcome: Adequate for Discharge   Problem: Activity: Goal: Risk for activity intolerance will decrease 02/19/2021 1026 by Hinton Dyer, RN Outcome: Adequate for Discharge 02/19/2021 1016 by Hinton Dyer, RN Outcome: Adequate for Discharge   Problem: Nutrition: Goal: Adequate nutrition will be maintained 02/19/2021 1026 by Hinton Dyer, RN Outcome: Adequate for Discharge 02/19/2021 1016 by Hinton Dyer, RN Outcome: Adequate for  Discharge   Problem: Coping: Goal: Level of anxiety will decrease 02/19/2021 1026 by Hinton Dyer, RN Outcome: Adequate for Discharge 02/19/2021 1016 by Hinton Dyer, RN Outcome: Adequate for Discharge   Problem: Elimination: Goal: Will not experience complications related to bowel motility 02/19/2021 1026 by Hinton Dyer, RN Outcome: Adequate for Discharge 02/19/2021 1016 by Hinton Dyer, RN Outcome: Adequate for Discharge Goal: Will not experience complications related to urinary retention 02/19/2021 1026 by Hinton Dyer, RN Outcome: Adequate for Discharge 02/19/2021 1016 by Hinton Dyer, RN Outcome: Adequate for Discharge   Problem: Pain Managment: Goal: General experience of comfort will improve 02/19/2021 1026 by Hinton Dyer, RN Outcome: Adequate for Discharge 02/19/2021 1016 by Hinton Dyer, RN Outcome: Adequate for Discharge   Problem: Safety: Goal: Ability to remain free from injury will improve 02/19/2021 1026 by Hinton Dyer, RN Outcome: Adequate for Discharge 02/19/2021 1016 by Hinton Dyer, RN Outcome: Adequate for Discharge   Problem: Skin Integrity: Goal: Risk for impaired skin integrity will decrease 02/19/2021 1026 by Hinton Dyer, RN Outcome: Adequate for Discharge 02/19/2021 1016 by Hinton Dyer, RN Outcome: Adequate for Discharge   Problem: Education: Goal: Knowledge of disease or condition will improve 02/19/2021 1026 by Hinton Dyer, RN Outcome: Adequate for Discharge 02/19/2021 1016 by Hinton Dyer, RN Outcome: Adequate for Discharge Goal: Knowledge of the prescribed therapeutic regimen will improve 02/19/2021 1026 by Hinton Dyer, RN Outcome: Adequate for Discharge 02/19/2021 1016 by Hinton Dyer, RN Outcome: Adequate for Discharge Goal: Individualized Educational Video(s) 02/19/2021 1026 by Hinton Dyer, RN Outcome: Adequate for Discharge 02/19/2021 1016 by Hinton Dyer, RN Outcome: Adequate for Discharge   Problem: Activity: Goal: Ability to tolerate increased activity will  improve 02/19/2021 1026 by Hinton Dyer, RN Outcome: Adequate for Discharge 02/19/2021 1016 by Hinton Dyer, RN Outcome: Adequate for Discharge Goal: Will verbalize the importance of balancing activity with adequate rest periods 02/19/2021 1026 by Cyndie Mull,  Sabastien Tyler, RN Outcome: Adequate for Discharge 02/19/2021 1016 by Hinton Dyer, RN Outcome: Adequate for Discharge   Problem: Respiratory: Goal: Ability to maintain a clear airway will improve 02/19/2021 1026 by Hinton Dyer, RN Outcome: Adequate for Discharge 02/19/2021 1016 by Hinton Dyer, RN Outcome: Adequate for Discharge Goal: Levels of oxygenation will improve 02/19/2021 1026 by Hinton Dyer, RN Outcome: Adequate for Discharge 02/19/2021 1016 by Hinton Dyer, RN Outcome: Adequate for Discharge Goal: Ability to maintain adequate ventilation will improve 02/19/2021 1026 by Hinton Dyer, RN Outcome: Adequate for Discharge 02/19/2021 1016 by Hinton Dyer, RN Outcome: Adequate for Discharge

## 2021-02-19 NOTE — Progress Notes (Signed)
Pt is alert and oriented. Discharge instructions/ AVS given to pt. 

## 2021-02-25 ENCOUNTER — Other Ambulatory Visit: Payer: Self-pay

## 2021-02-25 ENCOUNTER — Encounter (HOSPITAL_COMMUNITY): Payer: Self-pay

## 2021-02-25 ENCOUNTER — Inpatient Hospital Stay (HOSPITAL_COMMUNITY)
Admission: EM | Admit: 2021-02-25 | Discharge: 2021-02-27 | DRG: 190 | Disposition: A | Discharge status: Hospice | Payer: Medicare Other | Attending: Internal Medicine | Admitting: Internal Medicine

## 2021-02-25 ENCOUNTER — Emergency Department (HOSPITAL_COMMUNITY): Payer: Medicare Other

## 2021-02-25 DIAGNOSIS — Z9981 Dependence on supplemental oxygen: Secondary | ICD-10-CM

## 2021-02-25 DIAGNOSIS — M48061 Spinal stenosis, lumbar region without neurogenic claudication: Secondary | ICD-10-CM | POA: Diagnosis present

## 2021-02-25 DIAGNOSIS — Z9119 Patient's noncompliance with other medical treatment and regimen: Secondary | ICD-10-CM

## 2021-02-25 DIAGNOSIS — Z66 Do not resuscitate: Secondary | ICD-10-CM | POA: Diagnosis not present

## 2021-02-25 DIAGNOSIS — F32A Depression, unspecified: Secondary | ICD-10-CM | POA: Diagnosis present

## 2021-02-25 DIAGNOSIS — R0602 Shortness of breath: Secondary | ICD-10-CM | POA: Diagnosis present

## 2021-02-25 DIAGNOSIS — Z8616 Personal history of COVID-19: Secondary | ICD-10-CM | POA: Diagnosis not present

## 2021-02-25 DIAGNOSIS — Z880 Allergy status to penicillin: Secondary | ICD-10-CM

## 2021-02-25 DIAGNOSIS — F419 Anxiety disorder, unspecified: Secondary | ICD-10-CM | POA: Diagnosis present

## 2021-02-25 DIAGNOSIS — L97829 Non-pressure chronic ulcer of other part of left lower leg with unspecified severity: Secondary | ICD-10-CM | POA: Diagnosis present

## 2021-02-25 DIAGNOSIS — G894 Chronic pain syndrome: Secondary | ICD-10-CM | POA: Diagnosis present

## 2021-02-25 DIAGNOSIS — Z7189 Other specified counseling: Secondary | ICD-10-CM

## 2021-02-25 DIAGNOSIS — Z20822 Contact with and (suspected) exposure to covid-19: Secondary | ICD-10-CM | POA: Diagnosis present

## 2021-02-25 DIAGNOSIS — I2510 Atherosclerotic heart disease of native coronary artery without angina pectoris: Secondary | ICD-10-CM | POA: Diagnosis present

## 2021-02-25 DIAGNOSIS — Z7951 Long term (current) use of inhaled steroids: Secondary | ICD-10-CM

## 2021-02-25 DIAGNOSIS — Z881 Allergy status to other antibiotic agents status: Secondary | ICD-10-CM | POA: Diagnosis not present

## 2021-02-25 DIAGNOSIS — I350 Nonrheumatic aortic (valve) stenosis: Secondary | ICD-10-CM | POA: Diagnosis present

## 2021-02-25 DIAGNOSIS — J9622 Acute and chronic respiratory failure with hypercapnia: Secondary | ICD-10-CM | POA: Diagnosis present

## 2021-02-25 DIAGNOSIS — Z515 Encounter for palliative care: Secondary | ICD-10-CM

## 2021-02-25 DIAGNOSIS — J9621 Acute and chronic respiratory failure with hypoxia: Secondary | ICD-10-CM | POA: Diagnosis present

## 2021-02-25 DIAGNOSIS — F17210 Nicotine dependence, cigarettes, uncomplicated: Secondary | ICD-10-CM | POA: Diagnosis present

## 2021-02-25 DIAGNOSIS — G4733 Obstructive sleep apnea (adult) (pediatric): Secondary | ICD-10-CM | POA: Diagnosis present

## 2021-02-25 DIAGNOSIS — G8929 Other chronic pain: Secondary | ICD-10-CM | POA: Diagnosis present

## 2021-02-25 DIAGNOSIS — Z79891 Long term (current) use of opiate analgesic: Secondary | ICD-10-CM | POA: Diagnosis not present

## 2021-02-25 DIAGNOSIS — G9341 Metabolic encephalopathy: Secondary | ICD-10-CM | POA: Diagnosis present

## 2021-02-25 DIAGNOSIS — K219 Gastro-esophageal reflux disease without esophagitis: Secondary | ICD-10-CM | POA: Diagnosis present

## 2021-02-25 DIAGNOSIS — G4710 Hypersomnia, unspecified: Secondary | ICD-10-CM | POA: Diagnosis present

## 2021-02-25 DIAGNOSIS — J441 Chronic obstructive pulmonary disease with (acute) exacerbation: Secondary | ICD-10-CM | POA: Diagnosis present

## 2021-02-25 DIAGNOSIS — Z96642 Presence of left artificial hip joint: Secondary | ICD-10-CM | POA: Diagnosis present

## 2021-02-25 DIAGNOSIS — I10 Essential (primary) hypertension: Secondary | ICD-10-CM | POA: Diagnosis not present

## 2021-02-25 DIAGNOSIS — I110 Hypertensive heart disease with heart failure: Secondary | ICD-10-CM | POA: Diagnosis present

## 2021-02-25 DIAGNOSIS — Z7982 Long term (current) use of aspirin: Secondary | ICD-10-CM

## 2021-02-25 DIAGNOSIS — E785 Hyperlipidemia, unspecified: Secondary | ICD-10-CM | POA: Diagnosis present

## 2021-02-25 DIAGNOSIS — I5032 Chronic diastolic (congestive) heart failure: Secondary | ICD-10-CM | POA: Diagnosis present

## 2021-02-25 DIAGNOSIS — Z882 Allergy status to sulfonamides status: Secondary | ICD-10-CM

## 2021-02-25 DIAGNOSIS — Z79899 Other long term (current) drug therapy: Secondary | ICD-10-CM

## 2021-02-25 DIAGNOSIS — L089 Local infection of the skin and subcutaneous tissue, unspecified: Secondary | ICD-10-CM | POA: Diagnosis present

## 2021-02-25 DIAGNOSIS — Z720 Tobacco use: Secondary | ICD-10-CM | POA: Diagnosis present

## 2021-02-25 LAB — I-STAT VENOUS BLOOD GAS, ED
Acid-Base Excess: 15 mmol/L — ABNORMAL HIGH (ref 0.0–2.0)
Bicarbonate: 43.3 mmol/L — ABNORMAL HIGH (ref 20.0–28.0)
Calcium, Ion: 1.11 mmol/L — ABNORMAL LOW (ref 1.15–1.40)
HCT: 36 % (ref 36.0–46.0)
Hemoglobin: 12.2 g/dL (ref 12.0–15.0)
O2 Saturation: 79 %
Potassium: 4.4 mmol/L (ref 3.5–5.1)
Sodium: 138 mmol/L (ref 135–145)
TCO2: 45 mmol/L — ABNORMAL HIGH (ref 22–32)
pCO2, Ven: 71.6 mmHg (ref 44.0–60.0)
pH, Ven: 7.389 (ref 7.250–7.430)
pO2, Ven: 46 mmHg — ABNORMAL HIGH (ref 32.0–45.0)

## 2021-02-25 LAB — CBC WITH DIFFERENTIAL/PLATELET
Abs Immature Granulocytes: 2.1 10*3/uL — ABNORMAL HIGH (ref 0.00–0.07)
Basophils Absolute: 0 10*3/uL (ref 0.0–0.1)
Basophils Relative: 0 %
Eosinophils Absolute: 0.1 10*3/uL (ref 0.0–0.5)
Eosinophils Relative: 0 %
HCT: 39.4 % (ref 36.0–46.0)
Hemoglobin: 11.5 g/dL — ABNORMAL LOW (ref 12.0–15.0)
Immature Granulocytes: 7 %
Lymphocytes Relative: 5 %
Lymphs Abs: 1.5 10*3/uL (ref 0.7–4.0)
MCH: 25.6 pg — ABNORMAL LOW (ref 26.0–34.0)
MCHC: 29.2 g/dL — ABNORMAL LOW (ref 30.0–36.0)
MCV: 87.8 fL (ref 80.0–100.0)
Monocytes Absolute: 1.2 10*3/uL — ABNORMAL HIGH (ref 0.1–1.0)
Monocytes Relative: 4 %
Neutro Abs: 24.1 10*3/uL — ABNORMAL HIGH (ref 1.7–7.7)
Neutrophils Relative %: 84 %
Platelets: 338 10*3/uL (ref 150–400)
RBC: 4.49 MIL/uL (ref 3.87–5.11)
RDW: 16.6 % — ABNORMAL HIGH (ref 11.5–15.5)
WBC: 29.1 10*3/uL — ABNORMAL HIGH (ref 4.0–10.5)
nRBC: 0.1 % (ref 0.0–0.2)

## 2021-02-25 LAB — I-STAT ARTERIAL BLOOD GAS, ED
Acid-Base Excess: 16 mmol/L — ABNORMAL HIGH (ref 0.0–2.0)
Bicarbonate: 45.6 mmol/L — ABNORMAL HIGH (ref 20.0–28.0)
Calcium, Ion: 1.17 mmol/L (ref 1.15–1.40)
HCT: 34 % — ABNORMAL LOW (ref 36.0–46.0)
Hemoglobin: 11.6 g/dL — ABNORMAL LOW (ref 12.0–15.0)
O2 Saturation: 97 %
Patient temperature: 98.6
Potassium: 4 mmol/L (ref 3.5–5.1)
Sodium: 137 mmol/L (ref 135–145)
TCO2: 48 mmol/L — ABNORMAL HIGH (ref 22–32)
pCO2 arterial: 83.6 mmHg (ref 32.0–48.0)
pH, Arterial: 7.345 — ABNORMAL LOW (ref 7.350–7.450)
pO2, Arterial: 102 mmHg (ref 83.0–108.0)

## 2021-02-25 LAB — BASIC METABOLIC PANEL
Anion gap: 8 (ref 5–15)
BUN: 12 mg/dL (ref 6–20)
CO2: 38 mmol/L — ABNORMAL HIGH (ref 22–32)
Calcium: 8.8 mg/dL — ABNORMAL LOW (ref 8.9–10.3)
Chloride: 92 mmol/L — ABNORMAL LOW (ref 98–111)
Creatinine, Ser: 0.49 mg/dL (ref 0.44–1.00)
GFR, Estimated: >60 mL/min (ref >60)
Glucose, Bld: 104 mg/dL — ABNORMAL HIGH (ref 70–99)
Potassium: 4.3 mmol/L (ref 3.5–5.1)
Sodium: 138 mmol/L (ref 135–145)

## 2021-02-25 LAB — RESP PANEL BY RT-PCR (FLU A&B, COVID) ARPGX2
Influenza A by PCR: NEGATIVE
Influenza B by PCR: NEGATIVE
SARS Coronavirus 2 by RT PCR: NEGATIVE

## 2021-02-25 MED ORDER — SODIUM CHLORIDE 0.9 % IV SOLN
500.0000 mg | INTRAVENOUS | Status: DC
  Administered 2021-02-25: 500 mg via INTRAVENOUS
  Filled 2021-02-25: qty 500

## 2021-02-25 MED ORDER — POLYETHYLENE GLYCOL 3350 17 G PO PACK: 17.0000 g | PACK | Freq: Every day | ORAL | Status: DC | PRN

## 2021-02-25 MED ORDER — FLUTICASONE FUROATE-VILANTEROL 100-25 MCG/INH IN AEPB
1.0000 | INHALATION_SPRAY | Freq: Every day | RESPIRATORY_TRACT | Status: DC
  Filled 2021-02-25: qty 28

## 2021-02-25 MED ORDER — ENOXAPARIN SODIUM 40 MG/0.4ML IJ SOSY
40.0000 mg | PREFILLED_SYRINGE | INTRAMUSCULAR | Status: DC
  Administered 2021-02-25 – 2021-02-27 (×3): 40 mg via SUBCUTANEOUS
  Filled 2021-02-25 (×3): qty 0.4

## 2021-02-25 MED ORDER — BUDESONIDE 0.25 MG/2ML IN SUSP
0.2500 mg | Freq: Two times a day (BID) | RESPIRATORY_TRACT | Status: DC
  Administered 2021-02-25 – 2021-02-27 (×4): 0.25 mg via RESPIRATORY_TRACT
  Filled 2021-02-25 (×4): qty 2

## 2021-02-25 MED ORDER — IPRATROPIUM-ALBUTEROL 0.5-2.5 (3) MG/3ML IN SOLN
RESPIRATORY_TRACT | Status: AC
  Administered 2021-02-25: 3 mL via RESPIRATORY_TRACT
  Filled 2021-02-25: qty 3

## 2021-02-25 MED ORDER — NICOTINE 21 MG/24HR TD PT24
21.0000 mg | MEDICATED_PATCH | Freq: Every day | TRANSDERMAL | Status: DC
  Administered 2021-02-25 – 2021-02-27 (×3): 21 mg via TRANSDERMAL
  Filled 2021-02-25 (×3): qty 1

## 2021-02-25 MED ORDER — ACETAMINOPHEN 650 MG RE SUPP: 650.0000 mg | Freq: Four times a day (QID) | RECTAL | Status: DC | PRN

## 2021-02-25 MED ORDER — IPRATROPIUM-ALBUTEROL 0.5-2.5 (3) MG/3ML IN SOLN: 3.0000 mL | RESPIRATORY_TRACT | Status: DC | PRN

## 2021-02-25 MED ORDER — BISACODYL 5 MG PO TBEC: 5.0000 mg | DELAYED_RELEASE_TABLET | Freq: Every day | ORAL | Status: DC | PRN

## 2021-02-25 MED ORDER — PANTOPRAZOLE SODIUM 40 MG PO TBEC
40.0000 mg | DELAYED_RELEASE_TABLET | Freq: Two times a day (BID) | ORAL | Status: DC
  Administered 2021-02-25 – 2021-02-27 (×5): 40 mg via ORAL
  Filled 2021-02-25 (×5): qty 1

## 2021-02-25 MED ORDER — ONDANSETRON HCL 4 MG PO TABS: 4.0000 mg | ORAL_TABLET | Freq: Four times a day (QID) | ORAL | Status: DC | PRN

## 2021-02-25 MED ORDER — MORPHINE SULFATE (PF) 2 MG/ML IV SOLN: 2.0000 mg | INTRAVENOUS | Status: DC | PRN

## 2021-02-25 MED ORDER — BUDESON-GLYCOPYRROL-FORMOTEROL 160-9-4.8 MCG/ACT IN AERO: 2.0000 | INHALATION_SPRAY | Freq: Two times a day (BID) | RESPIRATORY_TRACT | Status: DC

## 2021-02-25 MED ORDER — REVEFENACIN 175 MCG/3ML IN SOLN
175.0000 ug | Freq: Every day | RESPIRATORY_TRACT | Status: DC
  Administered 2021-02-25 – 2021-02-27 (×3): 175 ug via RESPIRATORY_TRACT
  Filled 2021-02-25 (×3): qty 3

## 2021-02-25 MED ORDER — ZOLPIDEM TARTRATE 5 MG PO TABS: 5.0000 mg | ORAL_TABLET | Freq: Every evening | ORAL | Status: DC | PRN

## 2021-02-25 MED ORDER — IPRATROPIUM BROMIDE 0.02 % IN SOLN: 0.5000 mg | Freq: Once | RESPIRATORY_TRACT | Status: DC

## 2021-02-25 MED ORDER — IPRATROPIUM-ALBUTEROL 0.5-2.5 (3) MG/3ML IN SOLN
3.0000 mL | Freq: Four times a day (QID) | RESPIRATORY_TRACT | Status: DC
  Administered 2021-02-25: 3 mL via RESPIRATORY_TRACT
  Filled 2021-02-25: qty 3

## 2021-02-25 MED ORDER — METHYLPREDNISOLONE SODIUM SUCC 125 MG IJ SOLR
125.0000 mg | Freq: Once | INTRAMUSCULAR | Status: AC
  Administered 2021-02-25: 125 mg via INTRAVENOUS
  Filled 2021-02-25: qty 2

## 2021-02-25 MED ORDER — LORAZEPAM 2 MG/ML IJ SOLN: 1.0000 mg | INTRAMUSCULAR | Status: DC | PRN

## 2021-02-25 MED ORDER — SENNOSIDES-DOCUSATE SODIUM 8.6-50 MG PO TABS
2.0000 | ORAL_TABLET | Freq: Two times a day (BID) | ORAL | Status: DC
  Administered 2021-02-25 – 2021-02-27 (×5): 2 via ORAL
  Filled 2021-02-25 (×5): qty 2

## 2021-02-25 MED ORDER — METHYLPREDNISOLONE SODIUM SUCC 40 MG IJ SOLR: 40.0000 mg | Freq: Two times a day (BID) | INTRAMUSCULAR | Status: DC

## 2021-02-25 MED ORDER — LACTATED RINGERS IV SOLN: INTRAVENOUS | Status: AC

## 2021-02-25 MED ORDER — SODIUM CHLORIDE 0.9% FLUSH
3.0000 mL | Freq: Two times a day (BID) | INTRAVENOUS | Status: DC
  Administered 2021-02-25 – 2021-02-27 (×5): 3 mL via INTRAVENOUS

## 2021-02-25 MED ORDER — METHYLPREDNISOLONE SODIUM SUCC 125 MG IJ SOLR
80.0000 mg | Freq: Two times a day (BID) | INTRAMUSCULAR | Status: AC
  Administered 2021-02-25 – 2021-02-27 (×4): 80 mg via INTRAVENOUS
  Filled 2021-02-25 (×4): qty 2

## 2021-02-25 MED ORDER — ALBUTEROL SULFATE (2.5 MG/3ML) 0.083% IN NEBU: 2.5000 mg | INHALATION_SOLUTION | RESPIRATORY_TRACT | Status: DC | PRN

## 2021-02-25 MED ORDER — ALBUTEROL (5 MG/ML) CONTINUOUS INHALATION SOLN: 10.0000 mg/h | INHALATION_SOLUTION | RESPIRATORY_TRACT | Status: DC

## 2021-02-25 MED ORDER — DOCUSATE SODIUM 100 MG PO CAPS
100.0000 mg | ORAL_CAPSULE | Freq: Two times a day (BID) | ORAL | Status: DC
  Administered 2021-02-25 – 2021-02-27 (×5): 100 mg via ORAL
  Filled 2021-02-25 (×5): qty 1

## 2021-02-25 MED ORDER — FLUTICASONE PROPIONATE 50 MCG/ACT NA SUSP
1.0000 | Freq: Every day | NASAL | Status: DC
  Administered 2021-02-25 – 2021-02-27 (×3): 1 via NASAL
  Filled 2021-02-25 (×2): qty 16

## 2021-02-25 MED ORDER — ONDANSETRON HCL 4 MG/2ML IJ SOLN: 4.0000 mg | Freq: Four times a day (QID) | INTRAMUSCULAR | Status: DC | PRN

## 2021-02-25 MED ORDER — GUAIFENESIN ER 600 MG PO TB12: 600.0000 mg | ORAL_TABLET | Freq: Two times a day (BID) | ORAL | Status: DC | PRN

## 2021-02-25 MED ORDER — ARFORMOTEROL TARTRATE 15 MCG/2ML IN NEBU
15.0000 ug | INHALATION_SOLUTION | Freq: Two times a day (BID) | RESPIRATORY_TRACT | Status: DC
  Administered 2021-02-25 – 2021-02-27 (×4): 15 ug via RESPIRATORY_TRACT
  Filled 2021-02-25 (×4): qty 2

## 2021-02-25 MED ORDER — UMECLIDINIUM BROMIDE 62.5 MCG/INH IN AEPB
1.0000 | INHALATION_SPRAY | Freq: Every day | RESPIRATORY_TRACT | Status: DC
  Filled 2021-02-25: qty 7

## 2021-02-25 MED ORDER — TRAZODONE HCL 50 MG PO TABS
50.0000 mg | ORAL_TABLET | Freq: Every evening | ORAL | Status: DC | PRN
  Administered 2021-02-25: 50 mg via ORAL
  Filled 2021-02-25: qty 1

## 2021-02-25 MED ORDER — ASPIRIN EC 81 MG PO TBEC
81.0000 mg | DELAYED_RELEASE_TABLET | Freq: Every day | ORAL | Status: DC
  Administered 2021-02-25 – 2021-02-27 (×3): 81 mg via ORAL
  Filled 2021-02-25 (×3): qty 1

## 2021-02-25 MED ORDER — HYDRALAZINE HCL 20 MG/ML IJ SOLN: 5.0000 mg | INTRAMUSCULAR | Status: DC | PRN

## 2021-02-25 MED ORDER — DULOXETINE HCL 30 MG PO CPEP
30.0000 mg | ORAL_CAPSULE | Freq: Every day | ORAL | Status: DC
  Administered 2021-02-25 – 2021-02-27 (×3): 30 mg via ORAL
  Filled 2021-02-25 (×3): qty 1

## 2021-02-25 MED ORDER — ACETAMINOPHEN 325 MG PO TABS
650.0000 mg | ORAL_TABLET | Freq: Four times a day (QID) | ORAL | Status: DC | PRN
  Administered 2021-02-26: 650 mg via ORAL
  Filled 2021-02-25: qty 2

## 2021-02-25 MED ORDER — SODIUM CHLORIDE 0.9 % IV SOLN
2.0000 g | Freq: Three times a day (TID) | INTRAVENOUS | Status: DC
  Administered 2021-02-25 – 2021-02-27 (×7): 2 g via INTRAVENOUS
  Filled 2021-02-25 (×9): qty 2

## 2021-02-25 NOTE — ED Notes (Signed)
Awaiting available RT for transport to the floor.

## 2021-02-25 NOTE — Plan of Care (Signed)
  Problem: Clinical Measurements: Goal: Respiratory complications will improve Outcome: Progressing   Problem: Clinical Measurements: Goal: Cardiovascular complication will be avoided Outcome: Progressing   Problem: Coping: Goal: Level of anxiety will decrease Outcome: Progressing   Problem: Pain Managment: Goal: General experience of comfort will improve Outcome: Progressing   

## 2021-02-25 NOTE — Consult Note (Signed)
Palliative Medicine Inpatient Consult Note  Reason for consult:  Goals of Care  HPI:  Per intake H&P --> [name] is a 55 y.o. female with medical history significant of chronic diastolic CHF; COPD on 4L home O2; HTN; severe AS with consideration for TAVR; HLD; CAD; chronic pain; and OSA not on CPAP presenting with worsening SOB.  She was previously hospitalized from 6/10-13 for AMS associated with CO2 narcosis in the setting of COPD exacerbation.  Palliative care was consulted for ongoing goals of care conversations in the setting of progressive COPD and severe AS.  Clinical Assessment/Goals of Care:  *Please note that this is a verbal dictation therefore any spelling or grammatical errors are due to the "[place]" system interpretation.  I have reviewed medical records including EPIC notes, labs and imaging, received report from bedside RN, assessed the patient who is somnolent on BIPAP.    I met with patients spouse, Simona Huh to further discuss diagnosis prognosis, GOC, EOL wishes, disposition and options.   I introduced Palliative Medicine as specialized medical care for people living with serious illness. It focuses on providing relief from the symptoms and stress of a serious illness. The goal is to improve quality of life for both the patient and the family.  Simona Huh shares that [place] is from [place], [place]. She has lived in the [place] area for a large majority of her life. She has been married for the past 38 years. She has two daughters a three granddaughters. 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 She gets joy out of spending time with her grandchildren and watching game shows. She is a woman of faith and practices within Christianity.  Prior to admission, Tamisha was able to feed herself though her husband helped with all other bADL's. She has a walked, wheelchair, and bedside commode. She has been able to incrementally make it 2f to her  bathroom with her walker though more recently this has occurred rarely.   We reviewed Janiyah chronic medical conditions inclusive of her COPD, AS, and CAD. We discussed COPD and it's progressive course. Her husband shares that the treatments that were once very effective like steroids seem to be working less than they use to.We reviewed that patients activity tolerance has become less and less though sometimes Annlouise will have "spurts" of energy where she can clean the counters or fix the bed though these are short lasting.   Reviewed Eufelia's smoking history - per DSimona Huhshe only smokes a few cigarettes day but it is something that bring her happiness which is why she has not stopped. We discussed the addictive nature of tobacco.   A detailed discussion was had today regarding advanced directives - patient has never completed these.    Concepts specific to code status, artifical feeding and hydration, continued IV antibiotics and rehospitalization was had.  I introduced a MOST form and provided the "Hard Choices for [employer] booklet.  I shared with DSimona Huhthat typically patients like Durenda do not fair well in cardiopulmonary resuscitation. We discussed the importance of thinking about this now as opposed to later. He does share that in March Viveca was a DNR in the setting of having COVID PNA but when she was more alert this decision was overturned. I strongly recommended re-considering DNR/DNI  The difference between a aggressive medical intervention path  and a palliative comfort care path for this patient at this time was had. I shared that with six hospitalization in the  last seven months it is worrisome what the future may look like. I shared that we may be getting closer to the time where he needs to strongly consider hospice care.  I described hospice as a service for patients for have a life expectancy of < 6 months. It preserves dignity and quality at the end phases of life. The focus  changes from curative to symptom relief. Simona Huh understood this.  Discussed the importance of continued conversation with family and their  medical providers regarding overall plan of care and treatment options, ensuring decisions are within the context of the patients values and GOCs.  Decision Maker: Ursula Beath (spouse) [phone]  SUMMARY OF RECOMMENDATIONS   Full Code --> Recommended considering a DNR  Appreciate Pulmonary input regarding disease staging - this will help patients husband with future decisions  Broached the topic of hospice  I plan to check in with Brylynn and Simona Huh tomorrow for further conversation and ideally completion of the MOST form  Ongoing PMT support  Code Status/Advance Care Planning: FULL CODE   Palliative Prophylaxis:  Oral Care, Delirium Precautions  Additional Recommendations (Limitations, Scope, Preferences): Continue with full scope for the time being   Psycho-social/Spiritual:  Desire for further Chaplaincy support: Yes - Christian Additional Recommendations: Education on chronic disease processes   Prognosis: Poor in the setting of disease progression, multiple co-morbidities, and re-hospitalizations.  Discharge Planning: Unclear presently  Vitals:   02/25/21 1330 02/25/21 1416  BP: (!) 141/93   Pulse: 71   Resp: 15   Temp:    SpO2: 92% 96%    Intake/Output Summary (Last 24 hours) at 02/25/2021 1449 Last data filed at 02/25/2021 1350 Gross per 24 hour  Intake 100 ml  Output --  Net 100 ml   Last Weight  Most recent update: 02/25/2021  1:08 AM    Weight  76.2 kg (168 lb)            Gen:  Caucasian F in distress HEENT: Dry mucous membranes CV: Regular rate and rhythm  PULM: On Bipap ABD: soft/nontender  EXT: LLE dressing, weak pedal pulses Neuro: Somnolent  PPS: 20%   This conversation/these recommendations were discussed with patient primary care team, Dr. Nevada Crane  Time In: 1400 Time Out: 1510 Total Time:  70 Greater than 50%  of this time was spent counseling and coordinating care related to the above assessment and plan.  [place] Team Team Cell Phone: [phone] Please utilize secure chat with additional questions, if there is no response within 30 minutes please call the above phone number  Palliative Medicine Team providers are available by phone from 7am to 7pm daily and can be reached through the team cell phone.  Should this patient require assistance outside of these hours, please call the patient's attending physician.

## 2021-02-25 NOTE — ED Notes (Signed)
Attempted to call report. Charge RN reviewing chart. Will call back.

## 2021-02-25 NOTE — ED Triage Notes (Addendum)
Brought via [facility] from home for increase SOB over the last 3 days with decrease in o2 sats and a productive cough. Seen here and admitted a week ago for COPD. Rhonchi in lower and diminished through out. On home O2 via  4lpm.

## 2021-02-25 NOTE — Consult Note (Signed)
NAME:  [name], MRN:  [id number], DOB:  03-04-1966, LOS: 0 ADMISSION DATE:  02/25/2021, CONSULTATION DATE:  02/25/21 REFERRING MD:  Lorin Mercy TRH, CHIEF COMPLAINT:  Recurrent AECOPD   History of Present Illness:  55 yo F with severe COPD, chronic hypoxic respiratory failure on 4L, OSA non-compliant with NPPV, Severe AS, HTN, CAD, recent COVID infection (11/2020) and prior DNR status who presents to [facility] 6/19 with SOB, suspected recurrence of AECOPD. Patient was recently admitted 6/10-6/13 for AECOPD, and looks to have had several admissions in the past year for this. Follows with [facility].  Presented to [facility] 6/19 and admitted to [facility] for AECOPD.  During 11/2020 admission patient DNR.  Patient/family have rescinded this in the ED 6/19 however. Palliative care consulted in the ED to establish goals of care.   Ongoing and persistent worsening decline.  Worsening dyspnea on exertion.  Hypoxemia noted at home on oxygen therapy.  EMS came to house.  Had to clean the patient to go to the ED today.  Mild cough, largely nonproductive.  Chest x-ray with reticular looking infiltrates, query possibly some mild viral load.  BMP fairly normal, CO2 38. CBC with WBC 29 (recent steroids). Hgb 11.5.   Pulmonary consulted in setting of recurring AECOPD  Pertinent  Medical History  COPD Chronic hypoxic respiratory failure  AS  Tobacco use  HLD CAD Chronic pain OSA noncompliant with home NPPV GERD  HTN  Depression Anxiety   Significant Hospital Events: Including procedures, antibiotic start and stop dates in addition to other pertinent events   6/19 admitting to [facility] with AECOPD. PCM and PCCM consulted.   Interim History / Subjective:  Met with palliative care. Husband wanted to talk with Pulm before considering goals of care.  Long discussion regarding prognosis and severe COPD/end-stage/terminal.  Discussed ventilated: Unfortunate will likely cause more harm than good in any health related lead to  worsening neuromuscular weakness and further reduces near 0 likelihood that she would be living with a delay.  Husband and patient expressed understanding.  Agreed to DNR. Objective   Blood pressure (!) 141/93, pulse 71, temperature 98.2 F (36.8 C), temperature source Oral, resp. rate 15, height _0  (1.6 m), weight 76.2 kg, SpO2 96 %.        Intake/Output Summary (Last 24 hours) at 02/25/2021 1639 Last data filed at 02/25/2021 1350 Gross per 24 hour  Intake 100 ml  Output --  Net 100 ml   Filed Weights   02/25/21 0107  Weight: 76.2 kg    Examination: General: Chronically ill-appearing, on BiPAP HENT: Atraumatic, normocephalic Lungs: Distant and hard to appreciate lung sounds, no wheeze Cardiovascular: Tachycardic, harsh murmur systolic noted Abdomen: Nondistended, bowel sounds present Extremities: Right erythema, no edema Neuro: Easily arousable, follow commands MSK: There is Psych: Difficult to assess, flat affect  Labs/imaging that I havepersonally reviewed  (right click and "Reselect all SmartList Selections" daily)  CBC BMP  Covid neg  CXR  Resolved Hospital Problem list     Assessment & Plan:   Acute on chronic respiratory failure with hypoxia  Severe COPD with recurring acute exacerbation  OSA, non-compliant with outpt NPPV  Ongoing tobacco use  -unfortunately the patient has end stage/ terminal COPD  -sounds like pt wrestles with if she wants to present to the hospital at all -gets great satisfaction/pleasure from smoking, not interested in cessation at this time  P -tx AECOPD: steroids, abx, , duoneb as needed, budesonide, arformoterol, yupelri -NPPV  -SpO2  goal 88-92 -Ongoing goals of care -Not only is smoking detrimental for pulm status but smoking while on O2 poses significant risk for burn injury to face and airway. Ongoing cessation counseling.   Encounter for Palliative Care / Goals of Care -discussed with patient's husband and patient the  terminal nature of her COPD -Code status discussed and updated to DNR after discussion with patient and husband -appreciate PCM following for further goals of care, recommended outpatient primary care/hospice if she is well enough to leave the hospital   Best Practice (right click and "Reselect all SmartList Selections" daily)  Per primary  Labs   CBC: Recent Labs  Lab 02/19/21 0558 02/25/21 0146 02/25/21 0448 02/25/21 0716  WBC 21.5*  --  29.1*  --   NEUTROABS  --   --  24.1*  --   HGB 11.2* 11.6* 11.5* 12.2  HCT 37.4 34.0* 39.4 36.0  MCV 85.4  --  87.8  --   PLT 342  --  338  --     Basic Metabolic Panel: Recent Labs  Lab 02/19/21 0558 02/25/21 0146 02/25/21 0448 02/25/21 0716  NA 140 137 138 138  K 3.3* 4.0 4.3 4.4  CL 97*  --  92*  --   CO2 35*  --  38*  --   GLUCOSE 97  --  104*  --   BUN 12  --  12  --   CREATININE 0.56  --  0.49  --   CALCIUM 9.0  --  8.8*  --    GFR: Estimated Creatinine Clearance: 77.6 mL/min (by C-G formula based on SCr of 0.49 mg/dL). Recent Labs  Lab 02/19/21 0558 02/25/21 0448  WBC 21.5* 29.1*    Liver Function Tests: No results for input(s): AST, ALT, ALKPHOS, BILITOT, PROT, ALBUMIN in the last 168 hours. No results for input(s): LIPASE, AMYLASE in the last 168 hours. No results for input(s): AMMONIA in the last 168 hours.  ABG    Component Value Date/Time   PHART 7.345 (L) 02/25/2021 0146   PCO2ART 83.6 (HH) 02/25/2021 0146   PO2ART 102 02/25/2021 0146   HCO3 43.3 (H) 02/25/2021 0716   TCO2 45 (H) 02/25/2021 0716   O2SAT 79.0 02/25/2021 0716     Coagulation Profile: No results for input(s): INR, PROTIME in the last 168 hours.  Cardiac Enzymes: No results for input(s): CKTOTAL, CKMB, CKMBINDEX, TROPONINI in the last 168 hours.  HbA1C: Hgb A1c MFr Bld  Date/Time Value Ref Range Status  09/07/2020 02:35 AM 6.1 (H) 4.8 - 5.6 % Final    Comment:    (NOTE) Pre diabetes:          5.7%-6.4%  Diabetes:               >6.4%  Glycemic control for   <7.0% adults with diabetes     CBG: No results for input(s): GLUCAP in the last 168 hours.  Review of Systems:   No chest pain.  No orthopnea or PND.  Comprehensive review of systems otherwise negative.  Past Medical History:  She,  has a past medical history of Allergic rhinitis, Asthma, CHF (congestive heart failure) ([place]), Chronic pain, COPD (chronic obstructive pulmonary disease) ([place]), GERD (gastroesophageal reflux disease), Headache, History of COVID-19, Hypertension, Neck pain, Open wound of left hip, Shortness of breath, Sleep apnea, and Supplemental oxygen dependent.   Surgical History:   Past Surgical History:  Procedure Laterality Date   COLONOSCOPY     HUMERUS IM NAIL  Left 07/23/2017   Procedure: INTRAMEDULLARY (IM) NAIL HUMERAL;  Surgeon: Nicholes Stairs, MD;  Location: [place];  Service: Orthopedics;  Laterality: Left;   INCISION AND DRAINAGE HIP Left 04/01/2019   Procedure: IRRIGATION AND DEBRIDEMENT HIP;  Surgeon: Rod Can, MD;  Location: [facility];  Service: Orthopedics;  Laterality: Left;   LUMBAR LAMINECTOMY/DECOMPRESSION MICRODISCECTOMY  04/06/2012   Procedure: LUMBAR LAMINECTOMY/DECOMPRESSION MICRODISCECTOMY;  Surgeon: Jessy Oto, MD;  Location: [place];  Service: Orthopedics;  Laterality: N/A;  Left L3-4 and L4-5 lateral recess decompression MIS   NECK SURGERY  Sept 2005   TOTAL ABDOMINAL HYSTERECTOMY  Oct 2003   partial   TOTAL HIP ARTHROPLASTY Left 02/18/2019   Procedure: TOTAL HIP ARTHROPLASTY ANTERIOR APPROACH;  Surgeon: Rod Can, MD;  Location: [facility];  Service: Orthopedics;  Laterality: Left;   TUBAL LIGATION  1995     Social History:   reports that she has been smoking cigarettes. She has a 15.00 pack-year smoking history. She has never used smokeless tobacco. She reports previous alcohol use. She reports that she does not use drugs.   Family History:  Her family history includes Emphysema in her father and  mother; Lung cancer in her father; Rheum arthritis in her father.   Allergies Allergies  Allergen Reactions   Amoxicillin-Pot Clavulanate Itching    Ok with benadryl Did it involve swelling of the face/tongue/throat, SOB, or low BP? No Did it involve sudden or severe rash/hives, skin peeling, or any reaction on the inside of your mouth or nose? No Did you need to seek medical attention at a hospital or doctor's office? No When did it last happen?      6 months If all above answers are "NO", may proceed with cephalosporin use.    Erythromycin Rash and Other (See Comments)    Ok with benadryl    Moxifloxacin Itching and Other (See Comments)    Reaction to Avelox - ok with benadryl   Sulfamethoxazole-Trimethoprim Itching and Other (See Comments)    Ok with benadryl     Home Medications  Prior to Admission medications   Medication Sig Start Date End Date Taking? Authorizing Provider  albuterol (PROVENTIL) (2.5 MG/3ML) 0.083% nebulizer solution Inhale 3 mLs into the lungs every 4 (four) hours. 01/30/21  Yes [provider]  ALPRAZolam Duanne Moron) 0.5 MG tablet Take 1 tablet (0.5 mg total) by mouth at bedtime. 02/19/21 03/21/21 Yes [place] Donnamarie Poag, DO  Ascorbic Acid (VITAMIN C) 500 MG CHEW Chew 500 mg by mouth daily.   Yes [provider]  aspirin EC 81 MG tablet Take 81 mg by mouth daily. Swallow whole.   Yes [provider]  Budeson-Glycopyrrol-Formoterol (BREZTRI AEROSPHERE) 160-9-4.8 MCG/ACT AERO Inhale 2 puffs into the lungs in the morning and at bedtime.   Yes [provider]  cetirizine (ZYRTEC) 10 MG tablet Take 10 mg daily by mouth.   Yes [provider]  COLLAGEN PO Take 1,000 mg by mouth 3 (three) times daily.   Yes [provider]  dextromethorphan-guaiFENesin (MUCINEX DM) 30-600 MG 12hr tablet Take 1 tablet by mouth 2 (two) times daily as needed for cough.   Yes [provider]  DULoxetine (CYMBALTA) 30 MG capsule Take 30  mg by mouth 2 (two) times daily. 07/12/20  Yes [provider]  fluticasone (FLONASE) 50 MCG/ACT nasal spray Place 1 spray into both nostrils daily as needed for rhinitis or allergies.   Yes [provider]  furosemide (LASIX) 20 MG  tablet Take 1 tablet (20 mg total) by mouth daily as needed for edema (weight gain of 3lbs in 1 day or 5 lbs in 2 days.). Patient taking differently: Take 40-60 mg by mouth daily. 07/27/20  Yes Lavina Hamman, MD  gabapentin (NEURONTIN) 400 MG capsule Take 400 mg by mouth 3 (three) times daily. 08/09/20  Yes [provider]  HYDROcodone-acetaminophen (NORCO) 10-325 MG tablet Take 1 tablet by mouth every 6 (six) hours as needed (breakthrough pain). 04/02/19  Yes [place], Aaron Edelman, MD  montelukast (SINGULAIR) 10 MG tablet Take 10 mg at bedtime by mouth.   Yes [provider]  morphine (MS CONTIN) 30 MG 12 hr tablet Take 30 mg by mouth every 12 (twelve) hours.   Yes [provider]  nicotine (NICODERM CQ - DOSED IN MG/24 HOURS) 21 mg/24hr patch Place 21 mg onto the skin daily. 01/31/21  Yes [provider]  nystatin-triamcinolone ointment (MYCOLOG) Apply 1 application topically 2 (two) times daily as needed (rash). 10/16/20  Yes [provider]  OXYGEN Inhale 4-6 L/min into the lungs See admin instructions. 4 L/min at rest and 6 L/min when exerted   Yes [provider]  pantoprazole (PROTONIX) 40 MG tablet Take 40 mg by mouth 2 (two) times daily. 08/16/20  Yes [provider]  predniSONE (DELTASONE) 10 MG tablet Take 5 tablets (50 mg total) by mouth daily for 3 days, THEN 4 tablets (40 mg total) daily for 3 days, THEN 2 tablets (20 mg total) daily for 3 days, THEN 1 tablet (10 mg total) daily for 3 days. 02/20/21 03/04/21 Yes [place] Eric J, DO  SANTYL ointment Apply 1 application topically See admin instructions. Apply daily as directed to wound on left shin 02/21/21  Yes [provider]   senna-docusate (SENOKOT-S) 8.6-50 MG tablet Take 2 tablets by mouth 2 (two) times daily. Patient taking differently: Take 2 tablets by mouth 2 (two) times daily as needed for mild constipation. 11/20/20  Yes Hosie Poisson, MD  Sunscreens (CARMEX DAILY CARE LIP BALM) OINT Apply 1 application topically See admin instructions. Apply to lips daily   Yes [provider]  traZODone (DESYREL) 50 MG tablet Take 1 tablet (50 mg total) by mouth at bedtime as needed for sleep. 11/20/20  Yes Hosie Poisson, MD  Vitamin D, Ergocalciferol, (DRISDOL) 1.25 MG (50000 UNIT) CAPS capsule Take 50,000 Units by mouth every Monday. 06/15/20  Yes [provider]  zinc sulfate 220 (50 Zn) MG capsule Take 1 capsule (220 mg total) by mouth daily. 11/21/20  Yes Hosie Poisson, MD  ascorbic acid (VITAMIN C) 500 MG tablet Take 1 tablet (500 mg total) by mouth daily. Patient not taking: Reported on 02/25/2021 11/21/20   Hosie Poisson, MD  polyethylene glycol powder (GLYCOLAX/MIRALAX) 17 GM/SCOOP powder Take 17 g by mouth daily as needed for mild constipation. Patient not taking: Reported on 02/25/2021 09/04/20   [provider]     Critical care time: n/a

## 2021-02-25 NOTE — Progress Notes (Signed)
Pharmacy Antibiotic Note  [name] is a 55 y.o. female admitted on 02/25/2021 presenting with SOB/cough and leg wound.  Pharmacy has been consulted for cefepime dosing.  Plan: Cefepime 2g IV q8h Monitor renal function, ability to narrow and LOT  Height: 5\' 3"  (160 cm) Weight: 76.2 kg (168 lb) IBW/kg (Calculated) : 52.4  Temp (24hrs), Avg:98.2 F (36.8 C), Min:98.2 F (36.8 C), Max:98.2 F (36.8 C)  Recent Labs  Lab 02/19/21 0558 02/25/21 0448  WBC 21.5* 29.1*  CREATININE 0.56 0.49    Estimated Creatinine Clearance: 77.6 mL/min (by C-G formula based on SCr of 0.49 mg/dL).    Allergies  Allergen Reactions   Amoxicillin-Pot Clavulanate Itching    Ok with benadryl Did it involve swelling of the face/tongue/throat, SOB, or low BP? No Did it involve sudden or severe rash/hives, skin peeling, or any reaction on the inside of your mouth or nose? No Did you need to seek medical attention at a hospital or doctor's office? No When did it last happen?      6 months If all above answers are "NO", may proceed with cephalosporin use.    Erythromycin Rash    Ok with benadryl    Moxifloxacin Itching    Reaction to Avelox - ok with benadryl   Sulfamethoxazole-Trimethoprim Itching    Ok with benadryl    02/27/21, PharmD Clinical Pharmacist ED Pharmacist Phone # [phone] 02/25/2021 10:28 AM

## 2021-02-25 NOTE — H&P (Addendum)
History and Physical    [name] ZOX:[id number] DOB: 1965/11/03 DOA: 02/25/2021  PCP: [name], MD Consultants:  Terrace Arabia - neurology; Jerre Simon - cardiology; Boles - pulmonology; Pakala - GI Patient coming from:  Home - lives with husband; NOK: [place], [place], [phone]  Chief Complaint: SOB  HPI: [name] is a 55 y.o. female with medical history significant of chronic diastolic CHF; COPD on 4L home O2; HTN; severe AS with consideration for TAVR; HLD; CAD; chronic pain; and OSA not on CPAP presenting with worsening SOB.  She was previously hospitalized from 6/10-13 for AMS associated with CO2 narcosis in the setting of COPD exacerbation.   She went home from the hospital on 6/13 - he wasn't sure she was ready but she really wanted to go.  She was discharged with steroids, not antibiotics.  She was ok Tuesday and Wednesday but she was coughing more Thursday and Friday with cough productive of yellow sputum.  She has been more tired and sleepy the last 2 days.  Her memory and concentration weren't normal.  Yesterday, she asked 7-8 times what day it was.  She has been mildly confused.  Their daughter is a Engineer, civil (consulting) and suggested they check her O2 level - her O2 sat was 62% and she was not wearing her O2 but it improved to 95% when she put it back on.  It would constantly drop to the 60s within minutes.  She can't walk far regardless, but usually mostly uses the O2 for ambulation.  She is still smoking "very little".  She got an rx for nicotine patches recently and picked a stop date but it didn't work.  She has had COPD for a long time and recently has been diagnosed with severe AS - needs valve replacement but she would likely have to go on a ventilator to fix this.  He understands that she may be in the late stages of COPD.  He recognizes that she is unlikely to get better.  She had COVID and was placed on BIPAP but got better - she understands that she would not be likely to come off  the ventilator.  She really doesn't like the DNR band and so he prefers for her to remain full code for now.  He is open to palliative care consultation.  L leg wound has been there for 1-2 months - her O2 tank fell over and hit her on the leg.  She has had in-home wound care and she has had a hard time getting the infection to go away.    ED Course: Carryover, per Dr. Margo Aye:  [name] is a 55 y.o. female with medical history significant for severe COPD, chronic hypoxic and hypercapnic respiratory failure on 4 L [place] at all times, OSA nonadherent to BiPAP, severe AS and consideration for TAVR, hypertension, hyperlipidemia, mild nonobstructive CAD, GERD, depression/anxiety, and chronic pain who was brought to the ED for evaluation of respiratory distress.  Associated with hypersomnolence.  Work up in the ED revealed COPD exacerbation and concern for chronic CO2 retention.  Started on BIPAP, IV solumedrol, and azithromycin in the ED.  Review of Systems: Unable to perform  Ambulatory Status:  Ambulates with a walker, uses a wheelchair if she goes far  COVID Vaccine Status:   First shot   Past Medical History:  Diagnosis Date   Allergic rhinitis    Asthma    CHF (congestive heart failure) (HCC)    Chronic pain  COPD (chronic obstructive pulmonary disease) (HCC)    on home O2   GERD (gastroesophageal reflux disease)    Headache    History of COVID-19    Hypertension    Neck pain    Open wound of left hip    spouse changes dressing q day   Shortness of breath    Sleep apnea    NO DEVICE IN USE, USES SUPPLEMENTAL O2 IN PLACE OF DEVICE    Supplemental oxygen dependent    4L CONTINUOUS     Past Surgical History:  Procedure Laterality Date   COLONOSCOPY     HUMERUS IM NAIL Left 07/23/2017   Procedure: INTRAMEDULLARY (IM) NAIL HUMERAL;  Surgeon: Yolonda Kidaogers, Jason Patrick, MD;  Location: [facility] OR;  Service: Orthopedics;  Laterality: Left;   INCISION AND DRAINAGE HIP Left 04/01/2019    Procedure: IRRIGATION AND DEBRIDEMENT HIP;  Surgeon: Samson FredericSwinteck, Brian, MD;  Location: [facility];  Service: Orthopedics;  Laterality: Left;   LUMBAR LAMINECTOMY/DECOMPRESSION MICRODISCECTOMY  04/06/2012   Procedure: LUMBAR LAMINECTOMY/DECOMPRESSION MICRODISCECTOMY;  Surgeon: Kerrin ChampagneJames E Nitka, MD;  Location: [facility] OR;  Service: Orthopedics;  Laterality: N/A;  Left L3-4 and L4-5 lateral recess decompression MIS   NECK SURGERY  Sept 2005   TOTAL ABDOMINAL HYSTERECTOMY  Oct 2003   partial   TOTAL HIP ARTHROPLASTY Left 02/18/2019   Procedure: TOTAL HIP ARTHROPLASTY ANTERIOR APPROACH;  Surgeon: Samson FredericSwinteck, Brian, MD;  Location: [facility];  Service: Orthopedics;  Laterality: Left;   TUBAL LIGATION  1995    Social History   Socioeconomic History   Marital status: Married    Spouse name: Not on file   Number of children: 2   Years of education: some college   Highest education level: Not on file  Occupational History   Occupation: house wife  Tobacco Use   Smoking status: Every Day    Packs/day: 0.50    Years: 30.00    Pack years: 15.00    Types: Cigarettes   Smokeless tobacco: Never   Tobacco comments:    currently smoking 1/2 ppd.   Vaping Use   Vaping Use: Never used  Substance and Sexual Activity   Alcohol use: Not Currently   Drug use: No   Sexual activity: Not Currently    Birth control/protection: None  Other Topics Concern   Not on file  Social History Narrative   [place] lives in [place], with her husband.   Right-handed.   3-4 cup caffeine per day.   Social Determinants of Health   Financial Resource Strain: Not on file  Food Insecurity: Not on file  Transportation Needs: Not on file  Physical Activity: Not on file  Stress: Not on file  Social Connections: Not on file  Intimate Partner Violence: Not on file    Allergies  Allergen Reactions   Amoxicillin-Pot Clavulanate Itching    Ok with benadryl Did it involve swelling of the face/tongue/throat, SOB, or low BP? No Did it  involve sudden or severe rash/hives, skin peeling, or any reaction on the inside of your mouth or nose? No Did you need to seek medical attention at a hospital or doctor's office? No When did it last happen?      6 months If all above answers are "NO", may proceed with cephalosporin use.    Erythromycin Rash    Ok with benadryl    Moxifloxacin Itching    Reaction to Avelox - ok with benadryl   Sulfamethoxazole-Trimethoprim Itching    Ok with benadryl  Family History  Problem Relation Age of Onset   Emphysema Father    Rheum arthritis Father    Lung cancer Father    Emphysema Mother     Prior to Admission medications   Medication Sig Start Date End Date Taking? Authorizing Provider  albuterol (PROVENTIL) (2.5 MG/3ML) 0.083% nebulizer solution Inhale 3 mLs into the lungs every 6 (six) hours as needed for wheezing or shortness of breath. 01/30/21   [provider]  ALPRAZolam Prudy Feeler) 0.5 MG tablet Take 1 tablet (0.5 mg total) by mouth at bedtime. 02/19/21 03/21/21  [place], Alvira Philips, DO  ascorbic acid (VITAMIN C) 500 MG tablet Take 1 tablet (500 mg total) by mouth daily. 11/21/20   Kathlen Mody, MD  aspirin EC 81 MG tablet Take 81 mg by mouth daily. Swallow whole.    [provider]  Budeson-Glycopyrrol-Formoterol (BREZTRI AEROSPHERE) 160-9-4.8 MCG/ACT AERO Inhale 2 puffs into the lungs in the morning and at bedtime.    [provider]  cetirizine (ZYRTEC) 10 MG tablet Take 10 mg daily by mouth.    [provider]  COLLAGEN PO Take 1,000 mg by mouth 3 (three) times daily.    [provider]  dextromethorphan-guaiFENesin (MUCINEX DM) 30-600 MG 12hr tablet Take 1 tablet by mouth 2 (two) times daily as needed for cough.    [provider]  DULoxetine (CYMBALTA) 30 MG capsule Take 30 mg by mouth daily. 07/12/20   [provider]  fluticasone (FLONASE) 50 MCG/ACT nasal spray Place 1 spray into both nostrils daily.    [provider]  furosemide (LASIX) 20 MG tablet Take 1 tablet (20 mg total) by mouth daily as needed for edema (weight gain of 3lbs in 1 day or 5 lbs in 2 days.). 07/27/20   Rolly Salter, MD  gabapentin (NEURONTIN) 400 MG capsule Take 400 mg by mouth 3 (three) times daily. 08/09/20   [provider]  HYDROcodone-acetaminophen (NORCO) 10-325 MG tablet Take 1 tablet by mouth every 6 (six) hours as needed (breakthrough pain). 04/02/19   Swinteck, Arlys John, MD  montelukast (SINGULAIR) 10 MG tablet Take 10 mg at bedtime by mouth.    [provider]  morphine (MS CONTIN) 30 MG 12 hr tablet Take 30 mg by mouth every 12 (twelve) hours.    [provider]  nicotine (NICODERM CQ - DOSED IN MG/24 HOURS) 21 mg/24hr patch Place 21 mg onto the skin daily. 01/31/21   [provider]  nystatin-triamcinolone ointment (MYCOLOG) Apply 1 application topically 2 (two) times daily as needed (rash). 10/16/20   [provider]  OXYGEN Inhale 4 L into the lungs continuous.     [provider]  pantoprazole (PROTONIX) 40 MG tablet Take 40 mg by mouth 2 (two) times daily. 08/16/20   [provider]  polyethylene glycol powder (GLYCOLAX/MIRALAX) 17 GM/SCOOP powder Take 17 g by mouth daily as needed for mild constipation. 09/04/20   [provider]  predniSONE (DELTASONE) 10 MG tablet Take 5 tablets (50 mg total) by mouth daily for 3 days, THEN 4 tablets (40 mg total) daily for 3 days, THEN 2 tablets (20 mg total) daily for 3 days, THEN 1 tablet (10 mg total) daily for 3 days. 02/20/21 03/04/21  [place], Eric J, DO  senna-docusate (SENOKOT-S) 8.6-50 MG tablet Take 2 tablets by mouth 2 (two) times daily. 11/20/20   Kathlen Mody, MD  traZODone (DESYREL) 50 MG tablet Take 1 tablet (50 mg total) by mouth at  bedtime as needed for sleep. 11/20/20   Kathlen Mody, MD  Vitamin D, Ergocalciferol, (DRISDOL) 1.25 MG (50000 UNIT) CAPS capsule Take 50,000 Units by mouth every  Monday. 06/15/20   [provider]  zinc sulfate 220 (50 Zn) MG capsule Take 1 capsule (220 mg total) by mouth daily. 11/21/20   Kathlen Mody, MD    Physical Exam: Vitals:   02/25/21 0545 02/25/21 0600 02/25/21 0645 02/25/21 0728  BP: (!) 147/99 (!) 139/100 (!) 143/102   Pulse: 72 73 73   Resp: Temp:      TempSrc:      SpO2: 96% 96% 96% 97%  Weight:      Height:         General:  Appears very somnolent - able to open eyes and communicate for brief periods of time but otherwise sleeps Eyes:  normal lids, iris ENT:  grossly normal hearing, lips & tongue, mmm Neck:  no LAD, masses or thyromegaly Cardiovascular:  RRR, no r/g, 3-4 systolic murmur. Trace to 1+ LE edema.  Respiratory:   Diffuse wheezing and rhonchi.  Moderately poor air movement.  Normal respiratory effort. Abdomen:  soft, NT, ND Skin:  Ulcer along L medioanterior lower leg with purulent drainage Musculoskeletal:  no bony abnormality Lower extremity:  Trace to 1+ LE edema.  Limited foot exam with no ulcerations.   Psychiatric:  somnolent mood and affect, speech sparse Neurologic:  unable to perform    Radiological Exams on Admission: Independently reviewed - see discussion in A/P where applicable  DG Chest Port 1 View  Result Date: 02/25/2021 CLINICAL DATA:  Increasing shortness of breath, hypoxia, cough EXAM: PORTABLE CHEST 1 VIEW COMPARISON:  02/16/2021 FINDINGS: Single frontal view of the chest demonstrates an unremarkable cardiac silhouette. Chronic bibasilar scarring and fibrosis without acute airspace disease, effusion, or pneumothorax. No acute bony abnormality. IMPRESSION: 1. Stable bibasilar scarring and fibrosis.  No acute process. Electronically Signed   By: Sharlet Salina M.D.   On: 02/25/2021 01:41    EKG: Independently reviewed.  NSR with rate 87; no evidence of acute ischemia   Labs on Admission: I have personally reviewed the available labs and imaging studies at the time of the  admission.  Pertinent labs:   ABG: 7.345/83.6/102/45.6/97% -> VBG: 7.389/71.6/43.3 CO2 38 WBC 29.1 Hgb 11.5 COVID/flu negative   Assessment/Plan Active Problems:   Acute exacerbation of chronic obstructive pulmonary disease (COPD) (HCC)     Acute on chronic respiratory failure associated with a COPD exacerbation and/or progressive disease -Patient here last week for metabolic encephalopathy associated with COPD, now presenting with shortness of breath and productive cough as well as AMS once again -This is her 6th admission since November 2021 -She has history of O2-dependent COPD but apparently only wears her 4L with ambulation usually; last night she was in the low 60s while at rest if not wearing her O2 -She is continuing to smoke -She does not have fever and has marked but chronic leukocytosis.  -Chest x-ray is not consistent with pneumonia -She was given a continuous neb treatment in the ED and placed on BIPAP -She was on [place] O2 (4L) upon my evaluation but was too somnolent to communicate - RT asked to place patient back on BIPAP -will admit patient - with her failure of outpatient therapy and markedly decreased O2 sats (into the 60s), it seems likely that she will need several days of hospitalization to show sufficient improvement for discharge. -Nebulizers: scheduled  Duoneb and prn albuterol -Solu-Medrol 80 mg IV BID  -IV Cefepime -Continue Breztri -Hold Singulair while somnolent -Coordinated care with [facility] team/PT/OT/Nutrition/RT consults -Pulmonology consult to make aware of ongoing decline  Acute metabolic encephalopathy -Secondary to hypercapnia/CO2 narcosis, pCO2 86.   -Continue management as above, anticipate improvement with improving respiratory status   Goals of care -She lives at home with her husband and he is developing caregiver fatigue -She does have HHN for wound care but likely needs more assistance -She also has severe AS and is likely not a candidate  for repair; and chronic neck pain and is not a candidate for surgery there either -Based on the severity of her underlying COPD plus these other conditions, her overall prognosis is quite poor -She was briefly a DNR during her prior hospitalization but apparently was discomfited but the wrist band and so her husband is not comfortable making her DNR again. -Ongoing goals of care discussion is crucial at this juncture -Palliative care consultation requested   LLE wound -Appears to have wound infection -Will give Cefepime for COPD and wound infection -Wound care consult requested   Hypertension -She does not appear to be taking medications for this issue at this time  -Hold Lasix, no current concern for volume overload.   Severe aortic stenosis -Following with [facility] cardiology who are considering TAVR. -However, based on discussion with her husband, they are unlikely to proceed given the severity of her COPD and high likelihood of needing prolonged ventilation/risk of death associated with this surgery.   Depression/anxiety -Takes Xanax nightly for anxiety with BiPAP -Hold Xanax given somnolence but will provide prn IV Ativan -Continue Trazodone when able to take   Chronic pain -This is due to spinal stenosis, which she also cannot have surgically repaired. -On chronic MS Contin, Norco, Cymbalta, gabapentin at home. -Given her somnolence, will hold oral meds and also sedating meds -Her husband is concerned about breakthrough pain -Will give PRN morphine with goal of pain control/avoidance of withdrawal but without suppressing her respiratory drive further   -I have reviewed this patient in the [place] Controlled Substances Reporting System.  She is generally receiving medications from only one provider and appears to be taking them as prescribed. -She is at particularly high risk of opioid misuse, diversion, or overdose.    Tobacco dependence -Encourage cessation.   -She appears  to still be precontemplative regarding this issue -Patch ordered      Note: This patient has been tested and is negative for the novel coronavirus COVID-19. She has been at least partially vaccinated against COVID-19.    DVT prophylaxis: Lovenox  Code Status:  Full - confirmed with husband for now Family Communication: None present; I spoke with her husband by telephone at the time of admission Disposition Plan:  The patient is from: home  Anticipated d/c is to: be determined.  Anticipated d/c date will depend on clinical response to treatment, but possibly in the next 2-3 days if she has excellent response to treatment  Patient is currently: acutely ill Consults called: Pulmonology; Palliative care; TOC team/Nutrition/RT; wound care  Admission status: Admit - It is my clinical opinion that admission to INPATIENT is reasonable and necessary because this patient will require at least 2 midnights in the hospital to treat this condition based on the medical complexity of the problems presented.  Given the aforementioned information, the predictability of an adverse outcome is felt to be significant.      Jonah Blue MD [facility]   How to contact the [facility] Attending or Consulting provider 7A - 7P or covering provider during after hours 7P -7A, for this patient?  Check the care team in [facility] and look for a) attending/consulting [facility] provider listed and b) the [facility] team listed Log into [email or web address] and use [place]'s universal password to access. If you do not have the password, please contact the hospital operator. Locate the [facility] provider you are looking for under [facility] and page to a number that you can be directly reached. If you still have difficulty reaching the provider, please page the [facility] (Director on Call) for the Hospitalists listed on amion for assistance.   02/25/2021, 8:14 AM

## 2021-02-25 NOTE — Progress Notes (Signed)
RT NOTES: Transported patient from ED 38 to room 2W21 on bipap without incident.

## 2021-02-25 NOTE — ED Notes (Signed)
resp at bedside

## 2021-02-25 NOTE — ED Notes (Signed)
Pt placed back on BIPAP

## 2021-02-26 DIAGNOSIS — Z66 Do not resuscitate: Secondary | ICD-10-CM | POA: Diagnosis not present

## 2021-02-26 DIAGNOSIS — Z7189 Other specified counseling: Secondary | ICD-10-CM | POA: Diagnosis not present

## 2021-02-26 DIAGNOSIS — G9341 Metabolic encephalopathy: Secondary | ICD-10-CM

## 2021-02-26 DIAGNOSIS — I350 Nonrheumatic aortic (valve) stenosis: Secondary | ICD-10-CM

## 2021-02-26 DIAGNOSIS — J441 Chronic obstructive pulmonary disease with (acute) exacerbation: Secondary | ICD-10-CM | POA: Diagnosis not present

## 2021-02-26 DIAGNOSIS — Z515 Encounter for palliative care: Secondary | ICD-10-CM | POA: Diagnosis not present

## 2021-02-26 DIAGNOSIS — I10 Essential (primary) hypertension: Secondary | ICD-10-CM | POA: Diagnosis not present

## 2021-02-26 LAB — BASIC METABOLIC PANEL
Anion gap: 12 (ref 5–15)
BUN: 14 mg/dL (ref 6–20)
CO2: 34 mmol/L — ABNORMAL HIGH (ref 22–32)
Calcium: 9.1 mg/dL (ref 8.9–10.3)
Chloride: 92 mmol/L — ABNORMAL LOW (ref 98–111)
Creatinine, Ser: 0.57 mg/dL (ref 0.44–1.00)
GFR, Estimated: >60 mL/min (ref >60)
Glucose, Bld: 165 mg/dL — ABNORMAL HIGH (ref 70–99)
Potassium: 4.1 mmol/L (ref 3.5–5.1)
Sodium: 138 mmol/L (ref 135–145)

## 2021-02-26 LAB — CBC
HCT: 40.1 % (ref 36.0–46.0)
Hemoglobin: 12 g/dL (ref 12.0–15.0)
MCH: 25.6 pg — ABNORMAL LOW (ref 26.0–34.0)
MCHC: 29.9 g/dL — ABNORMAL LOW (ref 30.0–36.0)
MCV: 85.5 fL (ref 80.0–100.0)
Platelets: 319 10*3/uL (ref 150–400)
RBC: 4.69 MIL/uL (ref 3.87–5.11)
RDW: 16.3 % — ABNORMAL HIGH (ref 11.5–15.5)
WBC: 26.6 10*3/uL — ABNORMAL HIGH (ref 4.0–10.5)
nRBC: 0 % (ref 0.0–0.2)

## 2021-02-26 MED ORDER — BOOST PLUS PO LIQD
237.0000 mL | Freq: Three times a day (TID) | ORAL | Status: DC
  Administered 2021-02-26 – 2021-02-27 (×4): 237 mL via ORAL
  Filled 2021-02-26 (×5): qty 237

## 2021-02-26 MED ORDER — VITAMIN D (ERGOCALCIFEROL) 1.25 MG (50000 UNIT) PO CAPS
50000.0000 [IU] | ORAL_CAPSULE | ORAL | Status: DC
  Administered 2021-02-26: 50000 [IU] via ORAL
  Filled 2021-02-26 (×2): qty 1

## 2021-02-26 MED ORDER — ALPRAZOLAM 0.25 MG PO TABS
0.2500 mg | ORAL_TABLET | Freq: Three times a day (TID) | ORAL | Status: DC | PRN
  Administered 2021-02-26 (×2): 0.25 mg via ORAL
  Filled 2021-02-26: qty 1

## 2021-02-26 MED ORDER — MONTELUKAST SODIUM 10 MG PO TABS
10.0000 mg | ORAL_TABLET | Freq: Every day | ORAL | Status: DC
  Administered 2021-02-26: 10 mg via ORAL
  Filled 2021-02-26: qty 1

## 2021-02-26 MED ORDER — ZINC SULFATE 220 (50 ZN) MG PO CAPS
220.0000 mg | ORAL_CAPSULE | Freq: Every day | ORAL | Status: DC
  Administered 2021-02-26 – 2021-02-27 (×2): 220 mg via ORAL
  Filled 2021-02-26 (×2): qty 1

## 2021-02-26 MED ORDER — ALPRAZOLAM 0.5 MG PO TABS
0.5000 mg | ORAL_TABLET | Freq: Every day | ORAL | Status: DC
  Administered 2021-02-26: 0.5 mg via ORAL
  Filled 2021-02-26: qty 1

## 2021-02-26 MED ORDER — ASCORBIC ACID 500 MG PO TABS
500.0000 mg | ORAL_TABLET | Freq: Every day | ORAL | Status: DC
  Administered 2021-02-26 – 2021-02-27 (×2): 500 mg via ORAL
  Filled 2021-02-26 (×2): qty 1

## 2021-02-26 MED ORDER — MORPHINE SULFATE ER 15 MG PO TBCR
30.0000 mg | EXTENDED_RELEASE_TABLET | Freq: Two times a day (BID) | ORAL | Status: DC
  Administered 2021-02-26 – 2021-02-27 (×3): 30 mg via ORAL
  Filled 2021-02-26 (×3): qty 2

## 2021-02-26 MED ORDER — HYDROCODONE-ACETAMINOPHEN 10-325 MG PO TABS
1.0000 | ORAL_TABLET | Freq: Four times a day (QID) | ORAL | Status: DC | PRN
  Administered 2021-02-27: 1 via ORAL
  Filled 2021-02-26: qty 1

## 2021-02-26 MED ORDER — ADULT MULTIVITAMIN W/MINERALS CH
1.0000 | ORAL_TABLET | Freq: Every day | ORAL | Status: DC
  Administered 2021-02-26 – 2021-02-27 (×2): 1 via ORAL
  Filled 2021-02-26 (×2): qty 1

## 2021-02-26 MED ORDER — GABAPENTIN 400 MG PO CAPS
400.0000 mg | ORAL_CAPSULE | Freq: Three times a day (TID) | ORAL | Status: DC
  Administered 2021-02-26 – 2021-02-27 (×3): 400 mg via ORAL
  Filled 2021-02-26 (×3): qty 1

## 2021-02-26 NOTE — Plan of Care (Signed)

## 2021-02-26 NOTE — ED Provider Notes (Signed)
[name] Provider Note  CSN: [id number] Arrival date & time: 02/25/21 0056  Chief Complaint(s) Shortness of Breath and Cough  HPI [name] is a 55 y.o. female h/o COPD on 4LNC here for gradual onset SOB for several days.    Shortness of Breath Severity:  Severe Onset quality:  Gradual Duration:  3 days Timing:  Constant Progression:  Worsening Context: activity   Relieved by:  Nothing Worsened by:  Coughing, movement and exertion Associated symptoms: cough   Associated symptoms: no chest pain and no fever   Cough Associated symptoms: shortness of breath   Associated symptoms: no chest pain and no fever    Husband also reported increased somnolence over the last 2-3 days.  Past Medical History Past Medical History:  Diagnosis Date   Allergic rhinitis    Asthma    CHF (congestive heart failure) (HCC)    Chronic pain    COPD (chronic obstructive pulmonary disease) (HCC)    on home O2   GERD (gastroesophageal reflux disease)    Headache    History of COVID-19    Hypertension    Neck pain    Open wound of left hip    spouse changes dressing q day   Shortness of breath    Sleep apnea    NO DEVICE IN USE, USES SUPPLEMENTAL O2 IN PLACE OF DEVICE    Supplemental oxygen dependent    4L CONTINUOUS    Patient Active Problem List   Diagnosis Date Noted   Acute exacerbation of chronic obstructive pulmonary disease (COPD) (HCC) 02/25/2021   Acute metabolic encephalopathy 02/25/2021   Goals of care, counseling/discussion 02/25/2021   Wound infection 02/25/2021   Acute on chronic respiratory failure with hypoxia and hypercapnia (HCC) 02/16/2021   New onset headache 12/14/2020   DNR (do not resuscitate) 11/13/2020   Acute respiratory failure (HCC)    Respiratory distress    COVID-19 11/11/2020   Severe aortic stenosis 09/07/2020   Acute on chronic heart failure with preserved ejection fraction (HFpEF) (HCC) 09/07/2020   Sleep apnea     Osteoarthritis of left hip 02/18/2019   Chronic respiratory failure with hypoxia (HCC) 05/20/2018   Chronic pain 05/20/2018   Tobacco abuse 05/20/2018   Essential hypertension    GERD (gastroesophageal reflux disease)    COPD (chronic obstructive pulmonary disease) (HCC)    Asthma    Obesity due to excess calories    Lumbar spinal stenosis 04/03/2012    Class: Diagnosis of   COPD exacerbation (HCC) 05/31/2010   Home Medication(s) Prior to Admission medications   Medication Sig Start Date End Date Taking? Authorizing Provider  albuterol (PROVENTIL) (2.5 MG/3ML) 0.083% nebulizer solution Inhale 3 mLs into the lungs every 4 (four) hours. 01/30/21  Yes [provider]  ALPRAZolam Prudy Feeler) 0.5 MG tablet Take 1 tablet (0.5 mg total) by mouth at bedtime. 02/19/21 03/21/21 Yes [place], Alvira Philips, DO  Ascorbic Acid (VITAMIN C) 500 MG CHEW Chew 500 mg by mouth daily.   Yes [provider]  aspirin EC 81 MG tablet Take 81 mg by mouth daily. Swallow whole.   Yes [provider]  Budeson-Glycopyrrol-Formoterol (BREZTRI AEROSPHERE) 160-9-4.8 MCG/ACT AERO Inhale 2 puffs into the lungs in the morning and at bedtime.   Yes [provider]  cetirizine (ZYRTEC) 10 MG tablet Take 10 mg daily by mouth.   Yes [provider]  COLLAGEN PO Take 1,000 mg by mouth 3 (three) times daily.  Yes [provider]  dextromethorphan-guaiFENesin (MUCINEX DM) 30-600 MG 12hr tablet Take 1 tablet by mouth 2 (two) times daily as needed for cough.   Yes [provider]  DULoxetine (CYMBALTA) 30 MG capsule Take 30 mg by mouth 2 (two) times daily. 07/12/20  Yes [provider]  fluticasone (FLONASE) 50 MCG/ACT nasal spray Place 1 spray into both nostrils daily as needed for rhinitis or allergies.   Yes [provider]  furosemide (LASIX) 20 MG tablet Take 1 tablet (20 mg total) by mouth daily as needed for edema (weight gain of 3lbs in 1 day or 5 lbs in 2  days.). Patient taking differently: Take 40-60 mg by mouth daily. 07/27/20  Yes Rolly Salter, MD  gabapentin (NEURONTIN) 400 MG capsule Take 400 mg by mouth 3 (three) times daily. 08/09/20  Yes [provider]  HYDROcodone-acetaminophen (NORCO) 10-325 MG tablet Take 1 tablet by mouth every 6 (six) hours as needed (breakthrough pain). 04/02/19  Yes [place], Arlys John, MD  montelukast (SINGULAIR) 10 MG tablet Take 10 mg at bedtime by mouth.   Yes [provider]  morphine (MS CONTIN) 30 MG 12 hr tablet Take 30 mg by mouth every 12 (twelve) hours.   Yes [provider]  nicotine (NICODERM CQ - DOSED IN MG/24 HOURS) 21 mg/24hr patch Place 21 mg onto the skin daily. 01/31/21  Yes [provider]  nystatin-triamcinolone ointment (MYCOLOG) Apply 1 application topically 2 (two) times daily as needed (rash). 10/16/20  Yes [provider]  OXYGEN Inhale 4-6 L/min into the lungs See admin instructions. 4 L/min at rest and 6 L/min when exerted   Yes [provider]  pantoprazole (PROTONIX) 40 MG tablet Take 40 mg by mouth 2 (two) times daily. 08/16/20  Yes [provider]  predniSONE (DELTASONE) 10 MG tablet Take 5 tablets (50 mg total) by mouth daily for 3 days, THEN 4 tablets (40 mg total) daily for 3 days, THEN 2 tablets (20 mg total) daily for 3 days, THEN 1 tablet (10 mg total) daily for 3 days. 02/20/21 03/04/21 Yes [place], Eric J, DO  SANTYL ointment Apply 1 application topically See admin instructions. Apply daily as directed to wound on left shin 02/21/21  Yes [provider]  senna-docusate (SENOKOT-S) 8.6-50 MG tablet Take 2 tablets by mouth 2 (two) times daily. Patient taking differently: Take 2 tablets by mouth 2 (two) times daily as needed for mild constipation. 11/20/20  Yes Kathlen Mody, MD  Sunscreens (CARMEX DAILY CARE LIP BALM) OINT Apply 1 application topically See admin instructions. Apply to lips daily   Yes [provider]  traZODone (DESYREL) 50 MG tablet Take 1 tablet (50 mg total) by mouth at bedtime as needed for sleep. 11/20/20  Yes Kathlen Mody, MD  Vitamin D, Ergocalciferol, (DRISDOL) 1.25 MG (50000 UNIT) CAPS capsule Take 50,000 Units by mouth every Monday. 06/15/20  Yes [provider]  zinc sulfate 220 (50 Zn) MG capsule Take 1 capsule (220 mg total) by mouth daily. 11/21/20  Yes Kathlen Mody, MD  ascorbic acid (VITAMIN C) 500 MG tablet Take 1 tablet (500 mg total) by mouth daily. Patient not taking: Reported on 02/25/2021 11/21/20   Kathlen Mody, MD  polyethylene glycol powder (GLYCOLAX/MIRALAX) 17 GM/SCOOP powder Take 17 g by mouth daily as needed for mild constipation. Patient not taking: Reported on 02/25/2021 09/04/20   [provider]  Past Surgical History Past Surgical History:  Procedure Laterality Date   COLONOSCOPY     HUMERUS IM NAIL Left 07/23/2017   Procedure: INTRAMEDULLARY (IM) NAIL HUMERAL;  Surgeon: Yolonda Kida, MD;  Location[facility] OR;  Service: Orthopedics;  Laterality: Left;   INCISION AND DRAINAGE HIP Left 04/01/2019   Procedure: IRRIGATION AND DEBRIDEMENT HIP;  Surgeon: Samson Frederic, MD;  Location: [facility] ORS;  Service: Orthopedics;  Laterality: Left;   LUMBAR LAMINECTOMY/DECOMPRESSION MICRODISCECTOMY  04/06/2012   Procedure: LUMBAR LAMINECTOMY/DECOMPRESSION MICRODISCECTOMY;  Surgeon: Kerrin Champagne, MD;  Location: [facility] OR;  Service: Orthopedics;  Laterality: N/A;  Left L3-4 and L4-5 lateral recess decompression MIS   NECK SURGERY  Sept 2005   TOTAL ABDOMINAL HYSTERECTOMY  Oct 2003   partial   TOTAL HIP ARTHROPLASTY Left 02/18/2019   Procedure: TOTAL HIP ARTHROPLASTY ANTERIOR APPROACH;  Surgeon: Samson Frederic, MD;  Location: [facility] ORS;  Service: Orthopedics;  Laterality: Left;   TUBAL LIGATION  1995   Family History Family History   Problem Relation Age of Onset   Emphysema Father    Rheum arthritis Father    Lung cancer Father    Emphysema Mother     Social History Social History   Tobacco Use   Smoking status: Every Day    Packs/day: 0.50    Years: 30.00    Pack years: 15.00    Types: Cigarettes   Smokeless tobacco: Never   Tobacco comments:    currently smoking 1/2 ppd.   Vaping Use   Vaping Use: Never used  Substance Use Topics   Alcohol use: Not Currently   Drug use: No   Allergies Amoxicillin-pot clavulanate, Erythromycin, Moxifloxacin, and Sulfamethoxazole-trimethoprim  Review of Systems Review of Systems  Constitutional:  Negative for fever.  Respiratory:  Positive for cough and shortness of breath.   Cardiovascular:  Negative for chest pain.  All other systems are reviewed and are negative for acute change except as noted in the HPI  Physical Exam Vital Signs  I have reviewed the triage vital signs BP (!) 180/102 (BP Location: Right Arm)   Pulse 95   Temp 97.7 F (36.5 C)   Resp (!) 22   Ht 5\' 3"  (1.6 m)   Wt 80.5 kg   SpO2 98%   BMI 31.44 kg/m   Physical Exam Vitals reviewed.  Constitutional:      General: She is not in acute distress.    Appearance: She is well-developed. She is not diaphoretic.  HENT:     Head: Normocephalic and atraumatic.     Nose: Nose normal.  Eyes:     General: No scleral icterus.       Right eye: No discharge.        Left eye: No discharge.     Conjunctiva/sclera: Conjunctivae normal.     Pupils: Pupils are equal, round, and reactive to light.  Cardiovascular:     Rate and Rhythm: Normal rate and regular rhythm.     Heart sounds: No murmur heard.   No friction rub. No gallop.  Pulmonary:     Effort: Pulmonary effort is normal. Tachypnea and prolonged expiration present.     Breath sounds: Normal breath sounds. Decreased air movement present. No stridor. No rales.  Abdominal:     General: There is no distension.     Palpations: Abdomen is  soft.     Tenderness: There is no abdominal tenderness.  Musculoskeletal:        General: No tenderness.  Cervical back: Normal range of motion and neck supple.  Skin:    General: Skin is warm and dry.     Findings: No erythema or rash.  Neurological:     Mental Status: She is oriented to person, place, and time.     Comments: Alert but somnolent. Will fall asleep easily during conversation, but quick to respond to verbal stimuli.    ED Results and Treatments Labs (all labs ordered are listed, but only abnormal results are displayed) Labs Reviewed  BASIC METABOLIC PANEL - Abnormal; Notable for the following components:      Result Value   Chloride 92 (*)    CO2 38 (*)    Glucose, Bld 104 (*)    Calcium 8.8 (*)    All other components within normal limits  CBC WITH DIFFERENTIAL/PLATELET - Abnormal; Notable for the following components:   WBC 29.1 (*)    Hemoglobin 11.5 (*)    MCH 25.6 (*)    MCHC 29.2 (*)    RDW 16.6 (*)    Neutro Abs 24.1 (*)    Monocytes Absolute 1.2 (*)    Abs Immature Granulocytes 2.10 (*)    All other components within normal limits  BASIC METABOLIC PANEL - Abnormal; Notable for the following components:   Chloride 92 (*)    CO2 34 (*)    Glucose, Bld 165 (*)    All other components within normal limits  CBC - Abnormal; Notable for the following components:   WBC 26.6 (*)    MCH 25.6 (*)    MCHC 29.9 (*)    RDW 16.3 (*)    All other components within normal limits  I-STAT ARTERIAL BLOOD GAS, ED - Abnormal; Notable for the following components:   pH, Arterial 7.345 (*)    pCO2 arterial 83.6 (*)    Bicarbonate 45.6 (*)    TCO2 48 (*)    Acid-Base Excess 16.0 (*)    HCT 34.0 (*)    Hemoglobin 11.6 (*)    All other components within normal limits  I-STAT VENOUS BLOOD GAS, ED - Abnormal; Notable for the following components:   pCO2, Ven 71.6 (*)    pO2, Ven 46.0 (*)    Bicarbonate 43.3 (*)    TCO2 45 (*)    Acid-Base Excess 15.0 (*)     Calcium, Ion 1.11 (*)    All other components within normal limits  RESP PANEL BY RT-PCR (FLU A&B, COVID) ARPGX2  CBC WITH DIFFERENTIAL/PLATELET                                                                                                                         EKG  EKG Interpretation  Date/Time:  Sunday February 25 2021 01:39:54 EDT Ventricular Rate:  87 PR Interval:  119 QRS Duration: 86 QT Interval:  356 QTC Calculation: 429 R Axis:   64 Text Interpretation: Sinus rhythm Borderline short PR interval No significant change since last tracing Confirmed  by Drema Pry [phone]) on 02/25/2021 4:12:01 AM        Radiology No results found.  Pertinent labs & imaging results that were available during my care of the patient were reviewed by me and considered in my medical decision making (see chart for details).  Medications Ordered in ED Medications  methylPREDNISolone sodium succinate (SOLU-MEDROL) 125 mg/2 mL injection 80 mg (80 mg Intravenous Given 02/26/21 0923)  aspirin EC tablet 81 mg (81 mg Oral Given 02/26/21 0922)  DULoxetine (CYMBALTA) DR capsule 30 mg (30 mg Oral Given 02/26/21 0920)  nicotine (NICODERM CQ - dosed in mg/24 hours) patch 21 mg (21 mg Transdermal Patch Applied 02/26/21 0929)  traZODone (DESYREL) tablet 50 mg (50 mg Oral Given 02/25/21 2126)  pantoprazole (PROTONIX) EC tablet 40 mg (40 mg Oral Given 02/26/21 0922)  polyethylene glycol (MIRALAX / GLYCOLAX) packet 17 g (has no administration in time range)  senna-docusate (Senokot-S) tablet 2 tablet (2 tablets Oral Given 02/26/21 0922)  fluticasone (FLONASE) 50 MCG/ACT nasal spray 1 spray (1 spray Each Nare Given 02/26/21 1541)  sodium chloride flush (NS) 0.9 % injection 3 mL (3 mLs Intravenous Given 02/26/21 0930)  lactated ringers infusion (0 mLs Intravenous Stopped 02/25/21 2104)  acetaminophen (TYLENOL) tablet 650 mg (650 mg Oral Given 02/26/21 6045)    Or  acetaminophen (TYLENOL) suppository 650 mg ( Rectal See  Alternative 02/26/21 0922)  zolpidem (AMBIEN) tablet 5 mg (has no administration in time range)  docusate sodium (COLACE) capsule 100 mg (100 mg Oral Given 02/26/21 0921)  bisacodyl (DULCOLAX) EC tablet 5 mg (has no administration in time range)  ondansetron (ZOFRAN) tablet 4 mg (has no administration in time range)    Or  ondansetron (ZOFRAN) injection 4 mg (has no administration in time range)  guaiFENesin (MUCINEX) 12 hr tablet 600 mg (has no administration in time range)  hydrALAZINE (APRESOLINE) injection 5 mg (has no administration in time range)  enoxaparin (LOVENOX) injection 40 mg (40 mg Subcutaneous Given 02/26/21 1218)  ceFEPIme (MAXIPIME) 2 g in sodium chloride 0.9 % 100 mL IVPB (2 g Intravenous New Bag/Given 02/26/21 1329)  budesonide (PULMICORT) nebulizer solution 0.25 mg (0.25 mg Nebulization Given 02/26/21 0820)  arformoterol (BROVANA) nebulizer solution 15 mcg (15 mcg Nebulization Given 02/26/21 0820)  revefenacin (YUPELRI) nebulizer solution 175 mcg (175 mcg Nebulization Given 02/26/21 0824)  ipratropium-albuterol (DUONEB) 0.5-2.5 (3) MG/3ML nebulizer solution 3 mL (has no administration in time range)  ALPRAZolam (XANAX) tablet 0.5 mg (has no administration in time range)  gabapentin (NEURONTIN) capsule 400 mg (400 mg Oral Given 02/26/21 1539)  HYDROcodone-acetaminophen (NORCO) 10-325 MG per tablet 1 tablet (has no administration in time range)  morphine (MS CONTIN) 12 hr tablet 30 mg (30 mg Oral Given 02/26/21 1213)  Vitamin D (Ergocalciferol) (DRISDOL) capsule 50,000 Units (50,000 Units Oral Given 02/26/21 1329)  zinc sulfate capsule 220 mg (220 mg Oral Given 02/26/21 1213)  montelukast (SINGULAIR) tablet 10 mg (has no administration in time range)  ascorbic acid (VITAMIN C) tablet 500 mg (500 mg Oral Given 02/26/21 1213)  ALPRAZolam (XANAX) tablet 0.25 mg (0.25 mg Oral Given 02/26/21 1213)  lactose free nutrition (BOOST PLUS) liquid 237 mL (237 mLs Oral Given 02/26/21 1540)   multivitamin with minerals tablet 1 tablet (1 tablet Oral Given 02/26/21 1539)  methylPREDNISolone sodium succinate (SOLU-MEDROL) 125 mg/2 mL injection 125 mg (125 mg Intravenous Given 02/25/21 0300)  Procedures .1-3 Lead EKG Interpretation  Date/Time: 02/26/2021 5:19 PM Performed by: Nira Connardama, Emmah Bratcher Eduardo, MD Authorized by: Nira Connardama, Shontavia Mickel Eduardo, MD     Interpretation: normal     ECG rate:  98   ECG rate assessment: normal     Rhythm: sinus rhythm     Ectopy: none     Conduction: normal   .Critical Care  Date/Time: 02/26/2021 5:19 PM Performed by: Nira Connardama, Emily Forse Eduardo, MD Authorized by: Nira Connardama, Erskin Zinda Eduardo, MD   Critical care provider statement:    Critical care time (minutes):  45   Critical care was necessary to treat or prevent imminent or life-threatening deterioration of the following conditions:  Respiratory failure   Critical care was time spent personally by me on the following activities:  Discussions with consultants, evaluation of patient's response to treatment, examination of patient, ordering and performing treatments and interventions, ordering and review of laboratory studies, ordering and review of radiographic studies, pulse oximetry, re-evaluation of patient's condition, obtaining history from patient or surrogate and review of old charts   Care discussed with: admitting provider    (including critical care time)  Medical Decision Making / ED Course I have reviewed the nursing notes for this encounter and the patient's prior records (if available in EHR or on provided paperwork).   Alondria S Edrington was evaluated in Emergency Department on 02/26/2021 for the symptoms described in the history of present illness. She was evaluated in the context of the global COVID-19 pandemic, which necessitated consideration that the patient might be  at risk for infection with the SARS-CoV-2 virus that causes COVID-19. Institutional protocols and algorithms that pertain to the evaluation of patients at risk for COVID-19 are in a state of rapid change based on information released by regulatory bodies including the CDC and federal and state organizations. These policies and algorithms were followed during the patient's care in the ED.  COPD exacerbation with hypercapnea requiring Bipap. Leukocytosis close to her baseline. CXR w/o PNA. Admitted to SDU for continued management      Final Clinical Impression(s) / ED Diagnoses Final diagnoses:  SOB (shortness of breath)  COPD exacerbation (HCC)      This chart was dictated using voice recognition software.  Despite best efforts to proofread,  errors can occur which can change the documentation meaning.    Nira Connardama, Delene Morais Eduardo, MD 02/26/21 1721

## 2021-02-26 NOTE — Progress Notes (Signed)
RT removed pt from BiPAP and placed pt on 4LNC. Pt tolerated well with SVS. RT will continue to monitor pt.

## 2021-02-26 NOTE — Progress Notes (Addendum)
NAME:  [name], MRN:  [id number], DOB:  Jun 09, 1966, LOS: 1 ADMISSION DATE:  02/25/2021, CONSULTATION DATE:  02/25/21 REFERRING MD:  Ophelia Charter TRH, CHIEF COMPLAINT:  Recurrent AECOPD   History of Present Illness:  55 yo F with severe COPD, chronic hypoxic respiratory failure on 4L, OSA non-compliant with NPPV, Severe AS, HTN, CAD, recent COVID infection (11/2020) and prior DNR status who presents to [facility] 6/19 with SOB, suspected recurrence of AECOPD. Patient was recently admitted 6/10-6/13 for AECOPD, and looks to have had several admissions in the past year for this. Follows with [facility].  Presented to [facility] 6/19 and admitted to [facility] for AECOPD.  During 11/2020 admission patient DNR.  Patient/family have rescinded this in the ED 6/19 however. Palliative care consulted in the ED to establish goals of care.   Ongoing and persistent worsening decline.  Worsening dyspnea on exertion.  Hypoxemia noted at home on oxygen therapy.  EMS came to house.  Had to clean the patient to go to the ED today.  Mild cough, largely nonproductive.  Chest x-ray with reticular looking infiltrates, query possibly some mild viral load.  BMP fairly normal, CO2 38. CBC with WBC 29 (recent steroids). Hgb 11.5.   Pulmonary consulted in setting of recurring AECOPD  Pertinent  Medical History  COPD Chronic hypoxic respiratory failure  AS  Tobacco use  HLD CAD Chronic pain OSA noncompliant with home NPPV GERD  HTN  Depression Anxiety   Significant Hospital Events: Including procedures, antibiotic start and stop dates in addition to other pertinent events   6/19 admitting to [facility] with AECOPD. PCM and PCCM consulted.   Interim History / Subjective:  This morning [name] is more alert, oriented. She has trouble tolerating bipap at home but feels she can tolerate here because she gets xanax. Husband at bedside  Objective   Blood pressure (!) 180/102, pulse 95, temperature 97.7 F (36.5 C), resp. rate (!) 22,  height 5\' 3"  (1.6 m), weight 80.5 kg, SpO2 98 %.    FiO2 (%):  [30 %] 30 %   Intake/Output Summary (Last 24 hours) at 02/26/2021 1229 Last data filed at 02/26/2021 1216 Gross per 24 hour  Intake 100 ml  Output 2550 ml  Net -2450 ml   Filed Weights   02/25/21 0107 02/25/21 1841  Weight: 76.2 kg 80.5 kg    Examination: General: chronically ill appearing, on nasal cannula HENT: dry mucus membranes Lungs: diminished bilaterally, no wheezes or crackles, able to speak in full sentences Cardiovascular: Tachycardic, regular, systolic murmur present Abdomen: obese, soft Neuro: awake, alert, normal speech, follows commands  Labs/imaging that I havepersonally reviewed  (right click and "Reselect all SmartList Selections" daily)  CBC BMP  Covid neg  CXR  Resolved Hospital Problem list     Assessment & Plan:   Acute on chronic respiratory failure with hypoxia and hypercapnia Severe COPD with recurring acute exacerbation  OSA, non-compliant with outpt NPPV  Ongoing tobacco use, pre-contemplative  -unfortunately the patient has end stage/ terminal COPD - outpatient pulmonary notes state severe obstruction on PFTs.  P -tx AECOPD: continue steroids, abx, , duoneb as needed, budesonide, arformoterol, yupelri -NPPV nocturnally and as needed for naps -SpO2 goal 88-92 - on home 4LNC - even if she were able to tolerate BIPAP nocturnally, her life expectancy is still limited from her COPD.  - she had near complete respiratory arrest with moderate sedation from TEE - I do think ongoing benzos and narcotics at home for  her multiple symptoms are high risk as long as we are pursuing curative goals.   Encounter for Palliative Care / Goals of Care - talked with husband, patient at bedside, as well as palliative medicine NP - confirmed DNR. Agree that having hospice consultation would be most appropriate at this time.   Pulmonary will follow  Durel Salts, MD Pulmonary and Critical Care  Medicine [facility]    Best Practice (right click and "Reselect all SmartList Selections" daily)  Per primary  Labs   CBC: Recent Labs  Lab 02/25/21 0146 02/25/21 0448 02/25/21 0716 02/26/21 0444  WBC  --  29.1*  --  26.6*  NEUTROABS  --  24.1*  --   --   HGB 11.6* 11.5* 12.2 12.0  HCT 34.0* 39.4 36.0 40.1  MCV  --  87.8  --  85.5  PLT  --  338  --  319    Basic Metabolic Panel: Recent Labs  Lab 02/25/21 0146 02/25/21 0448 02/25/21 0716 02/26/21 0444  NA 137 138 138 138  K 4.0 4.3 4.4 4.1  CL  --  92*  --  92*  CO2  --  38*  --  34*  GLUCOSE  --  104*  --  165*  BUN  --  12  --  14  CREATININE  --  0.49  --  0.57  CALCIUM  --  8.8*  --  9.1   GFR: Estimated Creatinine Clearance: 79.8 mL/min (by C-G formula based on SCr of 0.57 mg/dL). Recent Labs  Lab 02/25/21 0448 02/26/21 0444  WBC 29.1* 26.6*    Liver Function Tests: No results for input(s): AST, ALT, ALKPHOS, BILITOT, PROT, ALBUMIN in the last 168 hours. No results for input(s): LIPASE, AMYLASE in the last 168 hours. No results for input(s): AMMONIA in the last 168 hours.  ABG    Component Value Date/Time   PHART 7.345 (L) 02/25/2021 0146   PCO2ART 83.6 (HH) 02/25/2021 0146   PO2ART 102 02/25/2021 0146   HCO3 43.3 (H) 02/25/2021 0716   TCO2 45 (H) 02/25/2021 0716   O2SAT 79.0 02/25/2021 0716     Coagulation Profile: No results for input(s): INR, PROTIME in the last 168 hours.  Cardiac Enzymes: No results for input(s): CKTOTAL, CKMB, CKMBINDEX, TROPONINI in the last 168 hours.  HbA1C: Hgb A1c MFr Bld  Date/Time Value Ref Range Status  09/07/2020 02:35 AM 6.1 (H) 4.8 - 5.6 % Final    Comment:    (NOTE) Pre diabetes:          5.7%-6.4%  Diabetes:              >6.4%  Glycemic control for   <7.0% adults with diabetes

## 2021-02-26 NOTE — Progress Notes (Signed)
Pharmacy Antibiotic Note  [name] is a 55 y.o. female admitted on 02/25/2021 with  wound infection .  Pharmacy has been consulted for Cefepime dosing.  ID: Leg wound infection + COPD. Scr WNL. WBC 26.6 down. No cultures  Cefepime 6/19>>  Plan: Cefepime 2g IV q8hr ok for renal function. Pharmacy will sign off. Please reconsult for further dosing assitance. MD messaged about home meds     Height: 5\' 3"  (160 cm) Weight: 80.5 kg (177 lb 7.5 oz) IBW/kg (Calculated) : 52.4  Temp (24hrs), Avg:98 F (36.7 C), Min:98 F (36.7 C), Max:98.1 F (36.7 C)  Recent Labs  Lab 02/25/21 0448 02/26/21 0444  WBC 29.1* 26.6*  CREATININE 0.49 0.57    Estimated Creatinine Clearance: 79.8 mL/min (by C-G formula based on SCr of 0.57 mg/dL).    Allergies  Allergen Reactions   Amoxicillin-Pot Clavulanate Itching    Ok with benadryl Did it involve swelling of the face/tongue/throat, SOB, or low BP? No Did it involve sudden or severe rash/hives, skin peeling, or any reaction on the inside of your mouth or nose? No Did you need to seek medical attention at a hospital or doctor's office? No When did it last happen?      6 months If all above answers are "NO", may proceed with cephalosporin use.    Erythromycin Rash and Other (See Comments)    Ok with benadryl    Moxifloxacin Itching and Other (See Comments)    Reaction to Avelox - ok with benadryl   Sulfamethoxazole-Trimethoprim Itching and Other (See Comments)    Ok with benadryl    Sonia Bromell S. 02/28/21, PharmD, BCPS Clinical Staff Pharmacist [email or web address] Merilynn Finland 02/26/2021 9:02 AM

## 2021-02-26 NOTE — Progress Notes (Signed)
Civil engineer, contracting [facility]) Hospital Liaison: RN note    Notified by Transition of Care Manger of patient/family request for [facility] services at home after discharge. Chart and patient information under review by [facility] physician. Hospice eligibility pending currently.    Writer spoke with husband, [name] to initiate education related to hospice philosophy, services and team approach to care.           verbalized understanding of information given.  Please send signed and completed DNR form home with patient/family. Patient will need prescriptions for discharge comfort medications.     DME needs have been discussed, patient currently has the following equipment in the home: oxygen, BiPAP, 3N1, walker and transport W/C.   Patient/family requests the following DME for delivery to the home: none.   [facility] aware of the above. Please notify [facility] when patient is ready to leave the unit at discharge. (Call [phone] or [phone] after 5pm.) [facility] information and contact numbers given to  [facility].       A Please do not hesitate to call with questions.    Thank you,   Elsie Saas, RN, [facility]      [facility] Liaison (listed on [facility] under Hospice /[facility])    [phone]

## 2021-02-26 NOTE — TOC Initial Note (Signed)
Transition of Care [facility]) - Initial/Assessment Note    Patient Details  Name: [name] MRN: [id number] Date of Birth: 08-04-1966  Transition of Care [facility]) CM/SW Contact:    Beckie Busing, RN Phone Number:[phone]  02/26/2021, 1:06 PM  Clinical Narrative:                 [facility] consulted for patient from home with spouse. Patient reports that she has functioned somewhat independently at home with her spouse. Both patient and husband admit to periods of SOB and low O2 levels. Patient has home O2 at baseline and also reports that she has a bipap but is not able to wear it due to not tolerating the mask. Patient and husband report that Alycia Rossetti with [employer] has been assisting them in trying to make the bipap more tolerable. Patient states that she is currently active with [facility] and wishes to continue services after discharging. CM has explained hospice referral and patient is agreeable to talking with someone from hospice. Choice offered and referral has been called to [facility]. TOC will continue to follow   Patient Goals and CMS Choice Patient states their goals for this hospitalization and ongoing recovery are:: To get better and return home CMS Medicare.gov Compare Post Acute Care list provided to:: Patient Choice offered to / list presented to : Patient, Spouse  Expected Discharge Plan and Services Expected Discharge Plan: Home w Hospice Care In-house Referral: NA Discharge Planning Services: CM Consult Post Acute Care Choice: Hospice Living arrangements for the past 2 months: Single Family Home                 DME Arranged: N/A DME Agency: NA       HH Arranged: NA HH Agency: NA        Prior Living Arrangements/Services Living arrangements for the past 2 months: Single Family Home Lives with:: Spouse Patient language and need for interpreter reviewed:: Yes Do you feel safe going back to the place where you live?: Yes      Need for Family Participation in  Patient Care: Yes (Comment) Care giver support system in place?: Yes (comment) Current home services:  (n/a) Criminal Activity/Legal Involvement Pertinent to Current Situation/Hospitalization: No - Comment as needed  Activities of Daily Living      Permission Sought/Granted Permission sought to share information with : Family Supports, Oceanographer granted to share information with : Yes, Verbal Permission Granted     Permission granted to share info w AGENCY: [facility]        Emotional Assessment Appearance:: Appears stated age Attitude/Demeanor/Rapport: Engaged, Gracious   Orientation: : Oriented to Self, Oriented to Place, Oriented to  Time, Oriented to Situation Alcohol / Substance Use: Not Applicable Psych Involvement: No (comment)  Admission diagnosis:  SOB (shortness of breath) [R06.02] Acute exacerbation of chronic obstructive pulmonary disease (COPD) (HCC) [J44.1] COPD exacerbation (HCC) [J44.1] Patient Active Problem List   Diagnosis Date Noted   Acute exacerbation of chronic obstructive pulmonary disease (COPD) (HCC) 02/25/2021   Acute metabolic encephalopathy 02/25/2021   Goals of care, counseling/discussion 02/25/2021   Wound infection 02/25/2021   Acute on chronic respiratory failure with hypoxia and hypercapnia (HCC) 02/16/2021   New onset headache 12/14/2020   DNR (do not resuscitate) 11/13/2020   Acute respiratory failure (HCC)    Respiratory distress    COVID-19 11/11/2020   Severe aortic stenosis 09/07/2020   Acute on chronic heart failure with preserved ejection fraction (HFpEF) (  HCC) 09/07/2020   Sleep apnea    Osteoarthritis of left hip 02/18/2019   Chronic respiratory failure with hypoxia (HCC) 05/20/2018   Chronic pain 05/20/2018   Tobacco abuse 05/20/2018   Essential hypertension    GERD (gastroesophageal reflux disease)    COPD (chronic obstructive pulmonary disease) (HCC)    Asthma    Obesity due to excess  calories    Lumbar spinal stenosis 04/03/2012    Class: Diagnosis of   COPD exacerbation (HCC) 05/31/2010   PCP:  [name], MD Pharmacy:   [facility] [phone] - [place], [place] - [address] AT [facility] [address] [place] [address] Phone: [phone] Fax: [phone]     Social Determinants of Health (SDOH) Interventions    Readmission Risk Interventions Readmission Risk Prevention Plan 02/26/2021  Transportation Screening Complete  PCP or Specialist Appt within 3-5 Days Complete  HRI or Home Care Consult Complete  Social Work Consult for Recovery Care Planning/Counseling Complete  Palliative Care Screening Complete  Medication Review Oceanographer) Referral to Pharmacy  Some recent data might be hidden

## 2021-02-26 NOTE — Consult Note (Signed)
WOC Nurse Consult Note: Patient receiving care in [facility]W21 Reason for Consult: LLE wound Wound type: Full thickness wound of unknown origin on the LLE Pressure Injury POA: NA Measurement: 2.7 cm x 1.7 cm x 0.1 cm  Wound bed: 30% yellow slough, 70% pink granulation tissue Drainage (amount, consistency, odor) Yellow slough on dressing.  Periwound: Intact Dressing procedure/placement/frequency: Clean the LLE wound with NS, pat dry and apply a piece of Xeroform gauze, cover with 4 x 4 and secure with Kerlix and Ace Wrap. Change daily.  Monitor the wound area(s) for worsening of condition such as: Signs/symptoms of infection, increase in size, development of or worsening of odor, development of pain, or increased pain at the affected locations.   Notify the medical team if any of these develop.  Thank you for the consult. WOC nurse will not follow at this time.   Please re-consult the WOC team if needed.  Renaldo Reel Katrinka Blazing, MSN, RN, CMSRN, Angus Seller, [facility] Wound Treatment Associate Pager [phone]

## 2021-02-26 NOTE — Progress Notes (Signed)
PROGRESS NOTE    Nichole RanchDrinda S Morrison  ZOX:[id number] DOB: [birth year] DOA: 02/25/2021 PCP: [name], Michael C, MD    Brief Narrative:  55 y.o. female with medical history significant of chronic diastolic CHF; COPD on 4L home O2; HTN; severe AS with consideration for TAVR; HLD; CAD; chronic pain; and OSA not on CPAP presenting with worsening SOB, admitted for hypercarbic failure initially requiring bipap  Assessment & Plan:   Principal Problem:   Acute exacerbation of chronic obstructive pulmonary disease (COPD) (HCC) Active Problems:   Lumbar spinal stenosis   Essential hypertension   Chronic pain   Tobacco abuse   Severe aortic stenosis   Acute metabolic encephalopathy   Goals of care, counseling/discussion   Wound infection  Acute on chronic respiratory failure associated with a COPD exacerbation and/or progressive disease -Presented with recurrent hypercarbic failure in the setting of end-stage COPD -Improved overnight after bipap support -Had been continued on neb tx and steroids -Baseline 4LNC -Appreciate input by Pulm and Palliative Care. Recommendation for end-of life discussions. Hospice consulted   Acute metabolic encephalopathy -Secondary to hypercapnia/CO2 narcosis, pCO2 86 at presentation  -Now alert and oriented, conversant   LLE wound -Concerns for possible wound infection -Pt given Cefepime for COPD and wound infection -Wound care consulted   Hypertension -Pt now more alert -Cont home meds   Severe aortic stenosis -Following with [facility] cardiology who were considering TAVR. -Per below, focus is now on hospice care   Depression/anxiety -Takes Xanax nightly for anxiety -Pt visibly anxious. Low dose PRN xanax was ordered   Chronic pain -This is due to spinal stenosis, which she also cannot have surgically repaired. -On chronic MS Contin, Norco, Cymbalta, gabapentin at home, will continue   Tobacco dependence -Encourage cessation.   -She appears  to still be precontemplative regarding this issue -Patch ordered at time of presentation   DVT prophylaxis: Lovenox subq Code Status: DNR Family Communication: Pt in room, family at bedside  Status is: Inpatient  Remains inpatient appropriate because:Inpatient level of care appropriate due to severity of illness  Dispo: The patient is from: Home              Anticipated d/c is to: Home              Patient currently is not medically stable to d/c.   Difficult to place patient No       Consultants:  Pulmonary Palliative Care  Procedures:    Antimicrobials: Anti-infectives (From admission, onward)    Start     Dose/Rate Route Frequency Ordered Stop   02/25/21 1400  ceFEPIme (MAXIPIME) 2 g in sodium chloride 0.9 % 100 mL IVPB        2 g 200 mL/hr over 30 Minutes Intravenous Every 8 hours 02/25/21 1029     02/25/21 0630  azithromycin (ZITHROMAX) 500 mg in sodium chloride 0.9 % 250 mL IVPB  Status:  Discontinued        500 mg 250 mL/hr over 60 Minutes Intravenous Every 24 hours 02/25/21 0622 02/25/21 1025       Subjective: Feeling better this AM after BIPAP overnight  Objective: Vitals:   02/26/21 0820 02/26/21 0827 02/26/21 1215 02/26/21 1718  BP:   (!) 180/102   Pulse: 79  95   Resp: 16  (!) 22   Temp:   97.7 F (36.5 C) 99.4 F (37.4 C)  TempSrc:      SpO2: 96% 100% 98%   Weight:  Height:        Intake/Output Summary (Last 24 hours) at 02/26/2021 1815 Last data filed at 02/26/2021 1216 Gross per 24 hour  Intake --  Output 2550 ml  Net -2550 ml   Filed Weights   02/25/21 0107 02/25/21 1841  Weight: 76.2 kg 80.5 kg    Examination: General exam: Awake, laying in bed, in nad Respiratory system: Normal respiratory effort, no wheezing Cardiovascular system: regular rate, s1, s2 Gastrointestinal system: Soft, nondistended, positive BS Central nervous system: CN2-12 grossly intact, strength intact Extremities: Perfused, no clubbing Skin: Normal  skin turgor, no notable skin lesions seen Psychiatry: Mood normal // no visual hallucinations   Data Reviewed: I have personally reviewed following labs and imaging studies  CBC: Recent Labs  Lab 02/25/21 0146 02/25/21 0448 02/25/21 0716 02/26/21 0444  WBC  --  29.1*  --  26.6*  NEUTROABS  --  24.1*  --   --   HGB 11.6* 11.5* 12.2 12.0  HCT 34.0* 39.4 36.0 40.1  MCV  --  87.8  --  85.5  PLT  --  338  --  319   Basic Metabolic Panel: Recent Labs  Lab 02/25/21 0146 02/25/21 0448 02/25/21 0716 02/26/21 0444  NA 137 138 138 138  K 4.0 4.3 4.4 4.1  CL  --  92*  --  92*  CO2  --  38*  --  34*  GLUCOSE  --  104*  --  165*  BUN  --  12  --  14  CREATININE  --  0.49  --  0.57  CALCIUM  --  8.8*  --  9.1   GFR: Estimated Creatinine Clearance: 79.8 mL/min (by C-G formula based on SCr of 0.57 mg/dL). Liver Function Tests: No results for input(s): AST, ALT, ALKPHOS, BILITOT, PROT, ALBUMIN in the last 168 hours. No results for input(s): LIPASE, AMYLASE in the last 168 hours. No results for input(s): AMMONIA in the last 168 hours. Coagulation Profile: No results for input(s): INR, PROTIME in the last 168 hours. Cardiac Enzymes: No results for input(s): CKTOTAL, CKMB, CKMBINDEX, TROPONINI in the last 168 hours. BNP (last 3 results) No results for input(s): PROBNP in the last 8760 hours. HbA1C: No results for input(s): HGBA1C in the last 72 hours. CBG: No results for input(s): GLUCAP in the last 168 hours. Lipid Profile: No results for input(s): CHOL, HDL, LDLCALC, TRIG, CHOLHDL, LDLDIRECT in the last 72 hours. Thyroid Function Tests: No results for input(s): TSH, T4TOTAL, FREET4, T3FREE, THYROIDAB in the last 72 hours. Anemia Panel: No results for input(s): VITAMINB12, FOLATE, FERRITIN, TIBC, IRON, RETICCTPCT in the last 72 hours. Sepsis Labs: No results for input(s): PROCALCITON, LATICACIDVEN in the last 168 hours.  Recent Results (from the past 240 hour(s))  Resp Panel  by RT-PCR (Flu A&B, Covid) Nasopharyngeal Swab     Status: None   Collection Time: 02/16/21  9:07 PM   Specimen: Nasopharyngeal Swab; Nasopharyngeal(NP) swabs in vial transport medium  Result Value Ref Range Status   SARS Coronavirus 2 by RT PCR NEGATIVE NEGATIVE Final    Comment: (NOTE) SARS-CoV-2 target nucleic acids are NOT DETECTED.  The SARS-CoV-2 RNA is generally detectable in upper respiratory specimens during the acute phase of infection. The lowest concentration of SARS-CoV-2 viral copies this assay can detect is 138 copies/mL. A negative result does not preclude SARS-Cov-2 infection and should not be used as the sole basis for treatment or other patient management decisions. A negative result may occur  with  improper specimen collection/handling, submission of specimen other than nasopharyngeal swab, presence of viral mutation(s) within the areas targeted by this assay, and inadequate number of viral copies(<138 copies/mL). A negative result must be combined with clinical observations, patient history, and epidemiological information. The expected result is Negative.  Fact Sheet for Patients:  BloggerCourse.com  Fact Sheet for Healthcare Providers:  SeriousBroker.it  This test is no t yet approved or cleared by the [place] FDA and  has been authorized for detection and/or diagnosis of SARS-CoV-2 by FDA under an Emergency Use Authorization (EUA). This EUA will remain  in effect (meaning this test can be used) for the duration of the COVID-19 declaration under Section 564(b)(1) of the Act, 21 U.S.C.section 360bbb-3(b)(1), unless the authorization is terminated  or revoked sooner.       Influenza A by PCR NEGATIVE NEGATIVE Final   Influenza B by PCR NEGATIVE NEGATIVE Final    Comment: (NOTE) The Xpert Xpress SARS-CoV-2/FLU/RSV plus assay is intended as an aid in the diagnosis of influenza from Nasopharyngeal swab  specimens and should not be used as a sole basis for treatment. Nasal washings and aspirates are unacceptable for Xpert Xpress SARS-CoV-2/FLU/RSV testing.  Fact Sheet for Patients: BloggerCourse.com  Fact Sheet for Healthcare Providers: SeriousBroker.it  This test is not yet approved or cleared by the [place] FDA and has been authorized for detection and/or diagnosis of SARS-CoV-2 by FDA under an Emergency Use Authorization (EUA). This EUA will remain in effect (meaning this test can be used) for the duration of the COVID-19 declaration under Section 564(b)(1) of the Act, 21 U.S.C. section 360bbb-3(b)(1), unless the authorization is terminated or revoked.  Performed at [facility], [address], [place], [place] [address]   Resp Panel by RT-PCR (Flu A&B, Covid) Nasopharyngeal Swab     Status: None   Collection Time: 02/25/21  3:47 AM   Specimen: Nasopharyngeal Swab; Nasopharyngeal(NP) swabs in vial transport medium  Result Value Ref Range Status   SARS Coronavirus 2 by RT PCR NEGATIVE NEGATIVE Final    Comment: (NOTE) SARS-CoV-2 target nucleic acids are NOT DETECTED.  The SARS-CoV-2 RNA is generally detectable in upper respiratory specimens during the acute phase of infection. The lowest concentration of SARS-CoV-2 viral copies this assay can detect is 138 copies/mL. A negative result does not preclude SARS-Cov-2 infection and should not be used as the sole basis for treatment or other patient management decisions. A negative result may occur with  improper specimen collection/handling, submission of specimen other than nasopharyngeal swab, presence of viral mutation(s) within the areas targeted by this assay, and inadequate number of viral copies(<138 copies/mL). A negative result must be combined with clinical observations, patient history, and epidemiological information. The expected result is  Negative.  Fact Sheet for Patients:  BloggerCourse.com  Fact Sheet for Healthcare Providers:  SeriousBroker.it  This test is no t yet approved or cleared by the [place] FDA and  has been authorized for detection and/or diagnosis of SARS-CoV-2 by FDA under an Emergency Use Authorization (EUA). This EUA will remain  in effect (meaning this test can be used) for the duration of the COVID-19 declaration under Section 564(b)(1) of the Act, 21 U.S.C.section 360bbb-3(b)(1), unless the authorization is terminated  or revoked sooner.       Influenza A by PCR NEGATIVE NEGATIVE Final   Influenza B by PCR NEGATIVE NEGATIVE Final    Comment: (NOTE) The Xpert Xpress SARS-CoV-2/FLU/RSV plus assay is intended as an  aid in the diagnosis of influenza from Nasopharyngeal swab specimens and should not be used as a sole basis for treatment. Nasal washings and aspirates are unacceptable for Xpert Xpress SARS-CoV-2/FLU/RSV testing.  Fact Sheet for Patients: BloggerCourse.com  Fact Sheet for Healthcare Providers: SeriousBroker.it  This test is not yet approved or cleared by the [place] FDA and has been authorized for detection and/or diagnosis of SARS-CoV-2 by FDA under an Emergency Use Authorization (EUA). This EUA will remain in effect (meaning this test can be used) for the duration of the COVID-19 declaration under Section 564(b)(1) of the Act, 21 U.S.C. section 360bbb-3(b)(1), unless the authorization is terminated or revoked.  Performed at [facility], [address] [place], [place] [address]      Radiology Studies: DG Chest Port 1 View  Result Date: 02/25/2021 CLINICAL DATA:  Increasing shortness of breath, hypoxia, cough EXAM: PORTABLE CHEST 1 VIEW COMPARISON:  02/16/2021 FINDINGS: Single frontal view of the chest demonstrates an unremarkable cardiac silhouette.  Chronic bibasilar scarring and fibrosis without acute airspace disease, effusion, or pneumothorax. No acute bony abnormality. IMPRESSION: 1. Stable bibasilar scarring and fibrosis.  No acute process. Electronically Signed   By: Sharlet Salina M.D.   On: 02/25/2021 01:41    Scheduled Meds:  ALPRAZolam  0.5 mg Oral QHS   arformoterol  15 mcg Nebulization BID   ascorbic acid  500 mg Oral Daily   aspirin EC  81 mg Oral Daily   budesonide (PULMICORT) nebulizer solution  0.25 mg Nebulization BID   docusate sodium  100 mg Oral BID   DULoxetine  30 mg Oral Daily   enoxaparin (LOVENOX) injection  40 mg Subcutaneous Q24H   fluticasone  1 spray Each Nare Daily   gabapentin  400 mg Oral TID   lactose free nutrition  237 mL Oral TID BM   methylPREDNISolone (SOLU-MEDROL) injection  80 mg Intravenous Q12H   montelukast  10 mg Oral QHS   morphine  30 mg Oral Q12H   multivitamin with minerals  1 tablet Oral Daily   nicotine  21 mg Transdermal Daily   pantoprazole  40 mg Oral BID   revefenacin  175 mcg Nebulization Daily   senna-docusate  2 tablet Oral BID   sodium chloride flush  3 mL Intravenous Q12H   Vitamin D (Ergocalciferol)  50,000 Units Oral Q Mon   zinc sulfate  220 mg Oral Daily   Continuous Infusions:  ceFEPime (MAXIPIME) IV 2 g (02/26/21 1329)     LOS: 1 day   Rickey Barbara, MD [facility] Pager On Amion  If 7PM-7AM, please contact night-coverage 02/26/2021, 6:15 PM

## 2021-02-26 NOTE — Progress Notes (Addendum)
Initial Nutrition Assessment  DOCUMENTATION CODES:   Not applicable  INTERVENTION:   Boost Plus PO TID, each supplement provides 360 kcal and 14 gm protein MVI with minerals daily  NUTRITION DIAGNOSIS:   Increased nutrient needs related to wound healing as evidenced by estimated needs.  GOAL:   Patient will meet greater than or equal to 90% of their needs  MONITOR:   PO intake, Supplement acceptance, Labs, Skin  REASON FOR ASSESSMENT:   Consult Assessment of nutrition requirement/status  ASSESSMENT:   55 yo female admitted with COPD exacerbation, LLE wound infection. PMH includes CHF, COPD on 4 L oxygen at home, HTN, severe AS, HLD, CAD, OSA, chronic pain, smoker.  Patient reports that she has been eating poorly for the past few months. She likes chocolate Boost supplements; drinks them at home sometimes. Usual weight in the 170's. Currently 177 lbs (6/19). Weight history reviewed. Patient has lost >9% of usual weight in the past 3 months.   Patient has been NPO since admission d/t respiratory status requiring BiPAP. She is currently on 4 L [place]. Diet just advanced to regular.   Palliative care team is following for ESCOPD.   Labs reviewed.  Medications reviewed and include vitamin C, colace, solumedrol, protonix, senokot-s, ergocalciferol, zinc sulfate.   NUTRITION - FOCUSED PHYSICAL EXAM:  Flowsheet Row Most Recent Value  Orbital Region No depletion  Upper Arm Region No depletion  Thoracic and Lumbar Region No depletion  Buccal Region No depletion  Temple Region No depletion  Clavicle Bone Region No depletion  Clavicle and Acromion Bone Region No depletion  Scapular Bone Region No depletion  Dorsal Hand No depletion  Patellar Region No depletion  Anterior Thigh Region No depletion  Posterior Calf Region No depletion  Edema (RD Assessment) Mild  Hair Reviewed  Eyes Reviewed  Mouth Reviewed  Skin Reviewed  Nails Reviewed       Diet Order:   Diet Order              Diet regular Room service appropriate? Yes; Fluid consistency: Thin  Diet effective now                   EDUCATION NEEDS:   Education needs have been addressed  Skin:  Skin Assessment: Skin Integrity Issues: Skin Integrity Issues:: Other (Comment) Other: LLE full thickness wound  Last BM:  no BM documented this admission  Height:   Ht Readings from Last 1 Encounters:  02/25/21 5\' 3"  (1.6 m)    Weight:   Wt Readings from Last 1 Encounters:  02/25/21 80.5 kg    Ideal Body Weight:  52.3 kg  BMI:  Body mass index is 31.44 kg/m.  Estimated Nutritional Needs:   Kcal:  1900-2100  Protein:  100-115 gm  Fluid:  >/= 1.9 L    02/27/21, RD, LDN, CNSC Please refer to Amion for contact information.

## 2021-02-26 NOTE — Progress Notes (Signed)
Palliative Medicine Inpatient Follow Up Note   Reason for consult:  Goals of Care   HPI:  Per intake H&P --> [name] is a 55 y.o. female with medical history significant of chronic diastolic CHF; COPD on 4L home O2; HTN; severe AS with consideration for TAVR; HLD; CAD; chronic pain; and OSA not on CPAP presenting with worsening SOB.  She was previously hospitalized from 6/10-13 for AMS associated with CO2 narcosis in the setting of COPD exacerbation.   Palliative care was consulted for ongoing goals of care conversations in the setting of progressive COPD and severe AS.  Today's Discussion (02/26/2021):  *Please note that this is a verbal dictation therefore any spelling or grammatical errors are due to the "[place] [facility]" system interpretation.  Chart reviewed. It appears that the Pulmonary team was able to meet with [name] and her husband, [name] last night and share the severity of her disease process with them.  I met [name] at bedside this morning. We reviewed her present state in the setting of her COPD. Discussed that per her - her exacerbations of COPD have become more frequent and although treated it doesn't seem that much is "helping them" anymore.   We reviewed that it may be time to start considering hospice care. Discussed the hospice standards and goal of providing dignity and quality at the end of life through symptom relief. Reviewed that on hospice patients symptoms are managed in the home without transitioning back and forth to the hospital.  We reviewed and completed a MOST form together with the following wishes:  Cardiopulmonary Resuscitation: Do Not Attempt Resuscitation (DNR/No CPR)  Medical Interventions: Comfort Measures: Keep clean, warm, and dry. Use medication by any route, positioning, wound care, and other measures to relieve pain and suffering. Use oxygen, suction and manual treatment of airway obstruction as needed for comfort. Do not transfer to  the hospital unless comfort needs cannot be met in current location.  Antibiotics: Determine use of limitation of antibiotics when infection occurs  IV Fluids: IV fluids for a defined trial period  Feeding Tube: No feeding tube   __________________________________________________ Addendum:  After meeting with [name] her spouse came in. I was able to update him on her present condition which is very much improved from yesterday. We further discussed the idea of hospice care with he plan for a hospice liaison to meet with them this afternoon.   Objective Assessment: Vital Signs Vitals:   02/26/21 0820 02/26/21 0827  BP:    Pulse: 79   Resp: 16   Temp:    SpO2: 96% 100%    Intake/Output Summary (Last 24 hours) at 02/26/2021 1212 Last data filed at 02/26/2021 0931 Gross per 24 hour  Intake 100 ml  Output 1600 ml  Net -1500 ml   Last Weight  Most recent update: 02/25/2021  6:44 PM    Weight  80.5 kg (177 lb 7.5 oz)            Gen:  Caucasian F in distress HEENT: Dry mucous membranes CV: Regular rate and rhythm PULM: On 4LPM/[place] ABD: soft/nontender EXT: LLE dressing, weak pedal pulses Neuro: Alert and Oriented  SUMMARY OF RECOMMENDATIONS   DNAR/DNI  MOST Completed, paper copy placed onto the chart electric copy can be found in [place]  DNR Form Completed, paper copy placed onto the chart electric copy can be found in [facility]  Appreciate TOC consult for In home hospice  Ongoing PMT support  Time  Spent: 45 Greater than 50% of the time was spent in counseling and coordination of care ______________________________________________________________________________________ [place] Team Team Cell Phone: [phone] Please utilize secure chat with additional questions, if there is no response within 30 minutes please call the above phone number  Palliative Medicine Team providers are available by phone from 7am to 7pm daily  and can be reached through the team cell phone.  Should this patient require assistance outside of these hours, please call the patient's attending physician.

## 2021-02-27 ENCOUNTER — Other Ambulatory Visit (HOSPITAL_COMMUNITY): Payer: Self-pay

## 2021-02-27 DIAGNOSIS — I10 Essential (primary) hypertension: Secondary | ICD-10-CM

## 2021-02-27 DIAGNOSIS — G9341 Metabolic encephalopathy: Secondary | ICD-10-CM | POA: Diagnosis not present

## 2021-02-27 DIAGNOSIS — G894 Chronic pain syndrome: Secondary | ICD-10-CM

## 2021-02-27 DIAGNOSIS — J441 Chronic obstructive pulmonary disease with (acute) exacerbation: Secondary | ICD-10-CM | POA: Diagnosis not present

## 2021-02-27 DIAGNOSIS — Z720 Tobacco use: Secondary | ICD-10-CM

## 2021-02-27 MED ORDER — CEFDINIR 300 MG PO CAPS
300.0000 mg | ORAL_CAPSULE | Freq: Two times a day (BID) | ORAL | 0 refills | Status: AC
  Filled 2021-02-27: qty 6, 3d supply, fill #0

## 2021-02-27 MED ORDER — PREDNISONE 10 MG PO TABS
ORAL_TABLET | ORAL | 0 refills | Status: AC
  Filled 2021-02-27: qty 40, 14d supply, fill #0

## 2021-02-27 NOTE — Progress Notes (Signed)
Discharge paperwork reviewed with pt and pt verbalized understanding. Pt alert and oriented x4 in no acute distress upon discharge. Pt's belongings given to pt's husband and taken to car. Pt rolled down in wheelchair by NT. PT's husband transporting pt home.

## 2021-02-27 NOTE — TOC Progression Note (Addendum)
Transition of Care [facility]) - Progression Note    Patient Details  Name: LASHEA GODA MRN: [id number] Date of Birth: 01-Nov-1965  [facility]) CM/SW Contact  Beckie Busing, RN Phone Number:[phone]  02/27/2021, 1:56 PM  Clinical Narrative:    TOC consulted d/c transportation needs. Patient is able to get up to wheelchair and to transported in vehicle with husband. Both patient and hiusband are in agreement. Husband has patients portable O2 tank in his vehicle and can provide O2 for transport. Patient will not need medical transportation. [facility] has been made aware of pending d/c today. TOC will sign off.   Expected Discharge Plan: Home w Hospice Care Barriers to Discharge: Continued Medical Work up  Expected Discharge Plan and Services Expected Discharge Plan: Home w Hospice Care In-house Referral: NA Discharge Planning Services: CM Consult Post Acute Care Choice: Hospice Living arrangements for the past 2 months: Single Family Home Expected Discharge Date: 02/27/21               DME Arranged: N/A DME Agency: NA       HH Arranged: NA HH Agency: NA         Social Determinants of Health (SDOH) Interventions    Readmission Risk Interventions Readmission Risk Prevention Plan 02/26/2021  Transportation Screening Complete  PCP or Specialist Appt within 3-5 Days Complete  HRI or Home Care Consult Complete  Social Work Consult for Recovery Care Planning/Counseling Complete  Palliative Care Screening Complete  Medication Review [facility]) Referral to Pharmacy  Some recent data might be hidden

## 2021-02-27 NOTE — Progress Notes (Signed)
   NAME:  [name], MRN:  [id number], DOB:  1966/03/17, LOS: 2 ADMISSION DATE:  02/25/2021, CONSULTATION DATE:  02/25/21 REFERRING MD:  Ophelia Charter TRH, CHIEF COMPLAINT:  Recurrent AECOPD   History of Present Illness:  55 yo F with severe COPD, chronic hypoxic respiratory failure on 4L, OSA non-compliant with NPPV, Severe AS, HTN, CAD, recent COVID infection (11/2020) and prior DNR status who presents to [facility] 6/19 with SOB, suspected recurrence of AECOPD. Patient was recently admitted 6/10-6/13 for AECOPD, and looks to have had several admissions in the past year for this. Follows with [facility].  Presented to [facility] 6/19 and admitted to [facility] for AECOPD.  During 11/2020 admission patient DNR.  Patient/family have rescinded this in the ED 6/19 however. Palliative care consulted in the ED to establish goals of care.   Ongoing and persistent worsening decline.  Worsening dyspnea on exertion.  Hypoxemia noted at home on oxygen therapy.  EMS came to house.  Had to clean the patient to go to the ED today.  Mild cough, largely nonproductive.  Chest x-ray with reticular looking infiltrates, query possibly some mild viral load.  BMP fairly normal, CO2 38. CBC with WBC 29 (recent steroids). Hgb 11.5.   Pulmonary consulted in setting of recurring AECOPD  Pertinent  Medical History  COPD Chronic hypoxic respiratory failure  AS  Tobacco use  HLD CAD Chronic pain OSA noncompliant with home NPPV GERD  HTN  Depression Anxiety   Significant Hospital Events: Including procedures, antibiotic start and stop dates in addition to other pertinent events   6/19 admitting to [facility] with AECOPD. PCM and PCCM consulted.   Interim History / Subjective:  Awake, alert, calm. Feels better this morning than yesterday.   Objective   Blood pressure (!) 142/86, pulse 72, temperature 99.5 F (37.5 C), temperature source Oral, resp. rate 18, height 5\' 3"  (1.6 m), weight 80.5 kg, SpO2 94 %.        Intake/Output Summary  (Last 24 hours) at 02/27/2021 1237 Last data filed at 02/27/2021 0316 Gross per 24 hour  Intake --  Output 550 ml  Net -550 ml   Filed Weights   02/25/21 0107 02/25/21 1841  Weight: 76.2 kg 80.5 kg    Examination: General: chronically ill appearing, on nasal cannula HENT: dry mucus membranes Lungs: no increased RR Cardiovascular: RRR Abdomen: obese, soft Neuro: awake, alert, normal speech, follows commands  Labs/imaging that I havepersonally reviewed  (right click and "Reselect all SmartList Selections" daily)  CBC BMP  Covid neg  CXR  Resolved Hospital Problem list     Assessment & Plan:   Acute on chronic respiratory failure with hypoxia and hypercapnia Severe COPD with recurring acute exacerbation  OSA, non-compliant with outpt NPPV  Ongoing tobacco use, pre-contemplative  Goals of care  Discussed with patient, Dr. 02/27/21 and the patient's husband at bedside. Plan is for transition home with hospice. Agree this is appropriate. Discussed the natural dying process from COPD and how to use bipap and medications to support. Ok for discharge home to hospice if support can be arranged for home.   Rhona Leavens, MD Pulmonary and Critical Care Medicine [facility]

## 2021-02-27 NOTE — Discharge Summary (Signed)
Physician Discharge Summary  [name] TGG:[id number] DOB: 03-Aug-1966 DOA: 02/25/2021  PCP: Nichole Inch, MD  Admit date: 02/25/2021 Discharge date: 02/27/2021  Admitted From: Home Disposition:  Home  Recommendations for Outpatient Follow-up:  Follow up with PCP as needed Please follow up with home hospice  Discharge Condition:Improved CODE STATUS:DNR Diet recommendation: Regular   Brief/Interim Summary: 55 y.o. female with medical history significant of chronic diastolic CHF; COPD on 4L home O2; HTN; severe AS with consideration for TAVR; HLD; CAD; chronic pain; and OSA not on CPAP presenting with worsening SOB, admitted for hypercarbic failure initially requiring bipap  Discharge Diagnoses:  Principal Problem:   Acute exacerbation of chronic obstructive pulmonary disease (COPD) (HCC) Active Problems:   Lumbar spinal stenosis   Essential hypertension   Chronic pain   Tobacco abuse   Severe aortic stenosis   Acute metabolic encephalopathy   Goals of care, counseling/discussion   Wound infection  Acute on chronic respiratory failure associated with a COPD exacerbation and/or progressive disease -Presented with recurrent hypercarbic failure in the setting of end-stage COPD -Improved overnight after bipap support -Had been continued on neb tx and steroids -Baseline 4LNC -Appreciate input by Pulm and Palliative Care. Recommendation for end-of life discussions. Hospice consulted with plans for home with hospice   Acute metabolic encephalopathy -Secondary to hypercapnia/CO2 narcosis, pCO2 86 at presentation  -Now alert and oriented, conversant -Now focus on symptoms moving forward    LLE wound -Concerns for possible wound infection -Pt given Cefepime for COPD and wound infection -Wound care consulted   Hypertension -Pt now more alert -Cont home meds   Severe aortic stenosis -Following with [facility] cardiology who were considering TAVR. -Per below, focus  is now on hospice care   Depression/anxiety -Takes Xanax nightly for anxiety -Would continue with anti-anxiety regimen as needed   Chronic pain -This is due to spinal stenosis, which she also cannot have surgically repaired. -On chronic MS Contin, Norco, Cymbalta, gabapentin at home, will continue   Tobacco dependence -Encourage cessation.   -She appears to still be precontemplative regarding this issue -Patch ordered at time of presentation while in hospital   Discharge Instructions   Allergies as of 02/27/2021       Reactions   Amoxicillin-pot Clavulanate Itching   Ok with benadryl Did it involve swelling of the face/tongue/throat, SOB, or low BP? No Did it involve sudden or severe rash/hives, skin peeling, or any reaction on the inside of your mouth or nose? No Did you need to seek medical attention at a hospital or doctor's office? No When did it last happen?      6 months If all above answers are "NO", may proceed with cephalosporin use.   Erythromycin Rash, Other (See Comments)   Ok with benadryl    Moxifloxacin Itching, Other (See Comments)   Reaction to Avelox - ok with benadryl   Sulfamethoxazole-trimethoprim Itching, Other (See Comments)   Ok with benadryl        Medication List     TAKE these medications    albuterol (2.5 MG/3ML) 0.083% nebulizer solution Commonly known as: PROVENTIL Inhale 3 mLs into the lungs every 4 (four) hours.   ALPRAZolam 0.5 MG tablet Commonly known as: XANAX Take 1 tablet (0.5 mg total) by mouth at bedtime.   ascorbic acid 500 MG tablet Commonly known as: VITAMIN C Take 1 tablet (500 mg total) by mouth daily. What changed: Another medication with the same name was removed. Continue taking this  medication, and follow the directions you see here.   aspirin EC 81 MG tablet Take 81 mg by mouth daily. Swallow whole.   Breztri Aerosphere 160-9-4.8 MCG/ACT Aero Generic drug: Budeson-Glycopyrrol-Formoterol Inhale 2 puffs into  the lungs in the morning and at bedtime.   Carmex Daily Care Lip Balm Oint Apply 1 application topically See admin instructions. Apply to lips daily   cefdinir 300 MG capsule Commonly known as: OMNICEF Take 1 capsule (300 mg total) by mouth 2 (two) times daily for 3 days.   cetirizine 10 MG tablet Commonly known as: ZYRTEC Take 10 mg daily by mouth.   COLLAGEN PO Take 1,000 mg by mouth 3 (three) times daily.   dextromethorphan-guaiFENesin 30-600 MG 12hr tablet Commonly known as: MUCINEX DM Take 1 tablet by mouth 2 (two) times daily as needed for cough.   DULoxetine 30 MG capsule Commonly known as: CYMBALTA Take 30 mg by mouth 2 (two) times daily.   fluticasone 50 MCG/ACT nasal spray Commonly known as: FLONASE Place 1 spray into both nostrils daily as needed for rhinitis or allergies.   furosemide 20 MG tablet Commonly known as: Lasix Take 1 tablet (20 mg total) by mouth daily as needed for edema (weight gain of 3lbs in 1 day or 5 lbs in 2 days.). What changed:  how much to take when to take this   gabapentin 400 MG capsule Commonly known as: NEURONTIN Take 400 mg by mouth 3 (three) times daily.   HYDROcodone-acetaminophen 10-325 MG tablet Commonly known as: NORCO Take 1 tablet by mouth every 6 (six) hours as needed (breakthrough pain).   montelukast 10 MG tablet Commonly known as: SINGULAIR Take 10 mg at bedtime by mouth.   morphine 30 MG 12 hr tablet Commonly known as: MS CONTIN Take 30 mg by mouth every 12 (twelve) hours.   nicotine 21 mg/24hr patch Commonly known as: NICODERM CQ - dosed in mg/24 hours Place 21 mg onto the skin daily.   nystatin-triamcinolone ointment Commonly known as: MYCOLOG Apply 1 application topically 2 (two) times daily as needed (rash).   OXYGEN Inhale 4-6 L/min into the lungs See admin instructions. 4 L/min at rest and 6 L/min when exerted   pantoprazole 40 MG tablet Commonly known as: PROTONIX Take 40 mg by mouth 2 (two)  times daily.   polyethylene glycol powder 17 GM/SCOOP powder Commonly known as: GLYCOLAX/MIRALAX Take 17 g by mouth daily as needed for mild constipation.   predniSONE 10 MG tablet Commonly known as: DELTASONE Take 6 tablets (60 mg total) by mouth daily for 3 days, THEN 4 tablets (40 mg total) daily for 3 days, THEN 2 tablets (20 mg total) daily for 3 days, THEN 1 tablet (10 mg total) daily for 3 days, THEN 0.5 tablets (5 mg total) daily for 2 days. Start taking on: February 27, 2021 What changed: See the new instructions.   Santyl ointment Generic drug: collagenase Apply 1 application topically See admin instructions. Apply daily as directed to wound on left shin   senna-docusate 8.6-50 MG tablet Commonly known as: Senokot-S Take 2 tablets by mouth 2 (two) times daily. What changed:  when to take this reasons to take this   traZODone 50 MG tablet Commonly known as: DESYREL Take 1 tablet (50 mg total) by mouth at bedtime as needed for sleep.   Vitamin D (Ergocalciferol) 1.25 MG (50000 UNIT) Caps capsule Commonly known as: DRISDOL Take 50,000 Units by mouth every Monday.   zinc sulfate 220 (50  Zn) MG capsule Take 1 capsule (220 mg total) by mouth daily.        Follow-up Information     Nichole Inch, MD Follow up.   Specialty: Family Medicine Why: As needed Contact information: [address] [place] [address] [phone]         Follow up with hospice services Follow up.                 Allergies  Allergen Reactions   Amoxicillin-Pot Clavulanate Itching    Ok with benadryl Did it involve swelling of the face/tongue/throat, SOB, or low BP? No Did it involve sudden or severe rash/hives, skin peeling, or any reaction on the inside of your mouth or nose? No Did you need to seek medical attention at a hospital or doctor's office? No When did it last happen?      6 months If all above answers are "NO", may proceed with cephalosporin use.     Erythromycin Rash and Other (See Comments)    Ok with benadryl    Moxifloxacin Itching and Other (See Comments)    Reaction to Avelox - ok with benadryl   Sulfamethoxazole-Trimethoprim Itching and Other (See Comments)    Ok with benadryl   Consultations: Palliative Care Pulmonary  Procedures/Studies: DG Chest Port 1 View  Result Date: 02/25/2021 CLINICAL DATA:  Increasing shortness of breath, hypoxia, cough EXAM: PORTABLE CHEST 1 VIEW COMPARISON:  02/16/2021 FINDINGS: Single frontal view of the chest demonstrates an unremarkable cardiac silhouette. Chronic bibasilar scarring and fibrosis without acute airspace disease, effusion, or pneumothorax. No acute bony abnormality. IMPRESSION: 1. Stable bibasilar scarring and fibrosis.  No acute process. Electronically Signed   By: Sharlet Salina M.D.   On: 02/25/2021 01:41   DG Chest Port 1 View  Result Date: 02/16/2021 CLINICAL DATA:  Respiratory distress. EXAM: PORTABLE CHEST 1 VIEW COMPARISON:  Chest x-ray dated November 12, 2020. FINDINGS: The heart size and mediastinal contours are within normal limits. Emphysematous changes again noted. Chronically coarsened interstitial markings, worse at the lung bases. No focal consolidation, pleural effusion, or pneumothorax. No acute osseous abnormality. IMPRESSION: 1. COPD. No active disease. Electronically Signed   By: Obie Dredge M.D.   On: 02/16/2021 18:28    Subjective: Very eager to go home  Discharge Exam: Vitals:   02/27/21 1152 02/27/21 1226  BP: (!) 142/86   Pulse: 88   Resp: 18   Temp: 99.5 F (37.5 C)   SpO2:  94%   Vitals:   02/27/21 0919 02/27/21 0920 02/27/21 1152 02/27/21 1226  BP:   (!) 142/86   Pulse:   88   Resp:   18   Temp:   99.5 F (37.5 C)   TempSrc:   Oral   SpO2: 100% 100%  94%  Weight:      Height:        General: Pt is alert, awake, not in acute distress Cardiovascular: RRR, S1/S2  Respiratory: CTA bilaterally, decreased BS, no rhonchi Abdominal: Soft,  NT, ND, bowel sounds + Extremities: no edema, no cyanosis   The results of significant diagnostics from this hospitalization (including imaging, microbiology, ancillary and laboratory) are listed below for reference.     Microbiology: Recent Results (from the past 240 hour(s))  Resp Panel by RT-PCR (Flu A&B, Covid) Nasopharyngeal Swab     Status: None   Collection Time: 02/25/21  3:47 AM   Specimen: Nasopharyngeal Swab; Nasopharyngeal(NP) swabs in vial transport medium  Result Value  Ref Range Status   SARS Coronavirus 2 by RT PCR NEGATIVE NEGATIVE Final    Comment: (NOTE) SARS-CoV-2 target nucleic acids are NOT DETECTED.  The SARS-CoV-2 RNA is generally detectable in upper respiratory specimens during the acute phase of infection. The lowest concentration of SARS-CoV-2 viral copies this assay can detect is 138 copies/mL. A negative result does not preclude SARS-Cov-2 infection and should not be used as the sole basis for treatment or other patient management decisions. A negative result may occur with  improper specimen collection/handling, submission of specimen other than nasopharyngeal swab, presence of viral mutation(s) within the areas targeted by this assay, and inadequate number of viral copies(<138 copies/mL). A negative result must be combined with clinical observations, patient history, and epidemiological information. The expected result is Negative.  Fact Sheet for Patients:  BloggerCourse.com  Fact Sheet for Healthcare Providers:  SeriousBroker.it  This test is no t yet approved or cleared by the [place] FDA and  has been authorized for detection and/or diagnosis of SARS-CoV-2 by FDA under an Emergency Use Authorization (EUA). This EUA will remain  in effect (meaning this test can be used) for the duration of the COVID-19 declaration under Section 564(b)(1) of the Act, 21 U.S.C.section 360bbb-3(b)(1), unless  the authorization is terminated  or revoked sooner.       Influenza A by PCR NEGATIVE NEGATIVE Final   Influenza B by PCR NEGATIVE NEGATIVE Final    Comment: (NOTE) The Xpert Xpress SARS-CoV-2/FLU/RSV plus assay is intended as an aid in the diagnosis of influenza from Nasopharyngeal swab specimens and should not be used as a sole basis for treatment. Nasal washings and aspirates are unacceptable for Xpert Xpress SARS-CoV-2/FLU/RSV testing.  Fact Sheet for Patients: BloggerCourse.com  Fact Sheet for Healthcare Providers: SeriousBroker.it  This test is not yet approved or cleared by the [place] FDA and has been authorized for detection and/or diagnosis of SARS-CoV-2 by FDA under an Emergency Use Authorization (EUA). This EUA will remain in effect (meaning this test can be used) for the duration of the COVID-19 declaration under Section 564(b)(1) of the Act, 21 U.S.C. section 360bbb-3(b)(1), unless the authorization is terminated or revoked.  Performed at [facility], [address] [place], [place] [address]      Labs: BNP (last 3 results) Recent Labs    09/06/20 0555 11/11/20 1216 02/16/21 1715  BNP 203.6* 102.0* 86.7   Basic Metabolic Panel: Recent Labs  Lab 02/25/21 0146 02/25/21 0448 02/25/21 0716 02/26/21 0444  NA 137 138 138 138  K 4.0 4.3 4.4 4.1  CL  --  92*  --  92*  CO2  --  38*  --  34*  GLUCOSE  --  104*  --  165*  BUN  --  12  --  14  CREATININE  --  0.49  --  0.57  CALCIUM  --  8.8*  --  9.1   Liver Function Tests: No results for input(s): AST, ALT, ALKPHOS, BILITOT, PROT, ALBUMIN in the last 168 hours. No results for input(s): LIPASE, AMYLASE in the last 168 hours. No results for input(s): AMMONIA in the last 168 hours. CBC: Recent Labs  Lab 02/25/21 0146 02/25/21 0448 02/25/21 0716 02/26/21 0444  WBC  --  29.1*  --  26.6*  NEUTROABS  --  24.1*  --   --   HGB 11.6* 11.5*  12.2 12.0  HCT 34.0* 39.4 36.0 40.1  MCV  --  87.8  --  85.5  PLT  --  338  --  319   Cardiac Enzymes: No results for input(s): CKTOTAL, CKMB, CKMBINDEX, TROPONINI in the last 168 hours. BNP: Invalid input(s): POCBNP CBG: No results for input(s): GLUCAP in the last 168 hours. D-Dimer No results for input(s): DDIMER in the last 72 hours. Hgb A1c No results for input(s): HGBA1C in the last 72 hours. Lipid Profile No results for input(s): CHOL, HDL, LDLCALC, TRIG, CHOLHDL, LDLDIRECT in the last 72 hours. Thyroid function studies No results for input(s): TSH, T4TOTAL, T3FREE, THYROIDAB in the last 72 hours.  Invalid input(s): FREET3 Anemia work up No results for input(s): VITAMINB12, FOLATE, FERRITIN, TIBC, IRON, RETICCTPCT in the last 72 hours. Urinalysis    Component Value Date/Time   COLORURINE STRAW (A) 02/15/2019 1309   APPEARANCEUR CLEAR 02/15/2019 1309   LABSPEC 1.002 (L) 02/15/2019 1309   PHURINE 7.0 02/15/2019 1309   GLUCOSEU NEGATIVE 02/15/2019 1309   HGBUR NEGATIVE 02/15/2019 1309   BILIRUBINUR NEGATIVE 02/15/2019 1309   KETONESUR NEGATIVE 02/15/2019 1309   PROTEINUR NEGATIVE 02/15/2019 1309   UROBILINOGEN 0.2 03/31/2012 1338   NITRITE NEGATIVE 02/15/2019 1309   LEUKOCYTESUR NEGATIVE 02/15/2019 1309   Sepsis Labs Invalid input(s): PROCALCITONIN,  WBC,  LACTICIDVEN Microbiology Recent Results (from the past 240 hour(s))  Resp Panel by RT-PCR (Flu A&B, Covid) Nasopharyngeal Swab     Status: None   Collection Time: 02/25/21  3:47 AM   Specimen: Nasopharyngeal Swab; Nasopharyngeal(NP) swabs in vial transport medium  Result Value Ref Range Status   SARS Coronavirus 2 by RT PCR NEGATIVE NEGATIVE Final    Comment: (NOTE) SARS-CoV-2 target nucleic acids are NOT DETECTED.  The SARS-CoV-2 RNA is generally detectable in upper respiratory specimens during the acute phase of infection. The lowest concentration of SARS-CoV-2 viral copies this assay can detect is 138  copies/mL. A negative result does not preclude SARS-Cov-2 infection and should not be used as the sole basis for treatment or other patient management decisions. A negative result may occur with  improper specimen collection/handling, submission of specimen other than nasopharyngeal swab, presence of viral mutation(s) within the areas targeted by this assay, and inadequate number of viral copies(<138 copies/mL). A negative result must be combined with clinical observations, patient history, and epidemiological information. The expected result is Negative.  Fact Sheet for Patients:  BloggerCourse.com  Fact Sheet for Healthcare Providers:  SeriousBroker.it  This test is no t yet approved or cleared by the [place] FDA and  has been authorized for detection and/or diagnosis of SARS-CoV-2 by FDA under an Emergency Use Authorization (EUA). This EUA will remain  in effect (meaning this test can be used) for the duration of the COVID-19 declaration under Section 564(b)(1) of the Act, 21 U.S.C.section 360bbb-3(b)(1), unless the authorization is terminated  or revoked sooner.       Influenza A by PCR NEGATIVE NEGATIVE Final   Influenza B by PCR NEGATIVE NEGATIVE Final    Comment: (NOTE) The Xpert Xpress SARS-CoV-2/FLU/RSV plus assay is intended as an aid in the diagnosis of influenza from Nasopharyngeal swab specimens and should not be used as a sole basis for treatment. Nasal washings and aspirates are unacceptable for Xpert Xpress SARS-CoV-2/FLU/RSV testing.  Fact Sheet for Patients: BloggerCourse.com  Fact Sheet for Healthcare Providers: SeriousBroker.it  This test is not yet approved or cleared by the [place] FDA and has been authorized for detection and/or diagnosis of SARS-CoV-2 by FDA under an Emergency Use Authorization (EUA). This EUA will remain  in effect (meaning  this test can be used) for the duration of the COVID-19 declaration under Section 564(b)(1) of the Act, 21 U.S.C. section 360bbb-3(b)(1), unless the authorization is terminated or revoked.  Performed at [facility], [address], [place], [place] [address]    Time spent: 30 min  SIGNED:   Rickey Barbara, MD  [facility] 02/27/2021, 1:39 PM  If 7PM-7AM, please contact night-coverage

## 2021-09-19 ENCOUNTER — Emergency Department (HOSPITAL_COMMUNITY)

## 2021-09-19 ENCOUNTER — Encounter (HOSPITAL_COMMUNITY): Payer: Self-pay | Admitting: *Deleted

## 2021-09-19 ENCOUNTER — Other Ambulatory Visit: Payer: Self-pay

## 2021-09-19 ENCOUNTER — Inpatient Hospital Stay (HOSPITAL_COMMUNITY)
Admission: EM | Admit: 2021-09-19 | Discharge: 2021-09-22 | DRG: 291 | Disposition: A | Discharge status: Hospice | Attending: Internal Medicine | Admitting: Internal Medicine

## 2021-09-19 DIAGNOSIS — Z96642 Presence of left artificial hip joint: Secondary | ICD-10-CM | POA: Diagnosis present

## 2021-09-19 DIAGNOSIS — E785 Hyperlipidemia, unspecified: Secondary | ICD-10-CM | POA: Diagnosis present

## 2021-09-19 DIAGNOSIS — Z7982 Long term (current) use of aspirin: Secondary | ICD-10-CM

## 2021-09-19 DIAGNOSIS — I5033 Acute on chronic diastolic (congestive) heart failure: Secondary | ICD-10-CM | POA: Diagnosis present

## 2021-09-19 DIAGNOSIS — K219 Gastro-esophageal reflux disease without esophagitis: Secondary | ICD-10-CM | POA: Diagnosis present

## 2021-09-19 DIAGNOSIS — E873 Alkalosis: Secondary | ICD-10-CM | POA: Diagnosis present

## 2021-09-19 DIAGNOSIS — J441 Chronic obstructive pulmonary disease with (acute) exacerbation: Secondary | ICD-10-CM | POA: Diagnosis present

## 2021-09-19 DIAGNOSIS — Z8616 Personal history of COVID-19: Secondary | ICD-10-CM

## 2021-09-19 DIAGNOSIS — Z888 Allergy status to other drugs, medicaments and biological substances status: Secondary | ICD-10-CM

## 2021-09-19 DIAGNOSIS — G4733 Obstructive sleep apnea (adult) (pediatric): Secondary | ICD-10-CM | POA: Diagnosis present

## 2021-09-19 DIAGNOSIS — Z881 Allergy status to other antibiotic agents status: Secondary | ICD-10-CM | POA: Diagnosis not present

## 2021-09-19 DIAGNOSIS — I350 Nonrheumatic aortic (valve) stenosis: Secondary | ICD-10-CM | POA: Diagnosis present

## 2021-09-19 DIAGNOSIS — J9601 Acute respiratory failure with hypoxia: Secondary | ICD-10-CM

## 2021-09-19 DIAGNOSIS — Z66 Do not resuscitate: Secondary | ICD-10-CM | POA: Diagnosis present

## 2021-09-19 DIAGNOSIS — G894 Chronic pain syndrome: Secondary | ICD-10-CM | POA: Diagnosis not present

## 2021-09-19 DIAGNOSIS — D649 Anemia, unspecified: Secondary | ICD-10-CM | POA: Diagnosis present

## 2021-09-19 DIAGNOSIS — Z20822 Contact with and (suspected) exposure to covid-19: Secondary | ICD-10-CM | POA: Diagnosis present

## 2021-09-19 DIAGNOSIS — Z9981 Dependence on supplemental oxygen: Secondary | ICD-10-CM

## 2021-09-19 DIAGNOSIS — Z79899 Other long term (current) drug therapy: Secondary | ICD-10-CM | POA: Diagnosis not present

## 2021-09-19 DIAGNOSIS — Z7952 Long term (current) use of systemic steroids: Secondary | ICD-10-CM

## 2021-09-19 DIAGNOSIS — G8929 Other chronic pain: Secondary | ICD-10-CM | POA: Diagnosis present

## 2021-09-19 DIAGNOSIS — J9622 Acute and chronic respiratory failure with hypercapnia: Secondary | ICD-10-CM | POA: Diagnosis present

## 2021-09-19 DIAGNOSIS — F17210 Nicotine dependence, cigarettes, uncomplicated: Secondary | ICD-10-CM | POA: Diagnosis present

## 2021-09-19 DIAGNOSIS — J9602 Acute respiratory failure with hypercapnia: Secondary | ICD-10-CM

## 2021-09-19 DIAGNOSIS — J440 Chronic obstructive pulmonary disease with acute lower respiratory infection: Secondary | ICD-10-CM | POA: Diagnosis present

## 2021-09-19 DIAGNOSIS — I110 Hypertensive heart disease with heart failure: Principal | ICD-10-CM | POA: Diagnosis present

## 2021-09-19 DIAGNOSIS — E669 Obesity, unspecified: Secondary | ICD-10-CM | POA: Diagnosis present

## 2021-09-19 DIAGNOSIS — I2510 Atherosclerotic heart disease of native coronary artery without angina pectoris: Secondary | ICD-10-CM | POA: Diagnosis present

## 2021-09-19 DIAGNOSIS — Z7951 Long term (current) use of inhaled steroids: Secondary | ICD-10-CM

## 2021-09-19 DIAGNOSIS — Z6828 Body mass index (BMI) 28.0-28.9, adult: Secondary | ICD-10-CM

## 2021-09-19 DIAGNOSIS — Z825 Family history of asthma and other chronic lower respiratory diseases: Secondary | ICD-10-CM

## 2021-09-19 DIAGNOSIS — J13 Pneumonia due to Streptococcus pneumoniae: Secondary | ICD-10-CM | POA: Diagnosis present

## 2021-09-19 DIAGNOSIS — J9621 Acute and chronic respiratory failure with hypoxia: Secondary | ICD-10-CM | POA: Diagnosis present

## 2021-09-19 DIAGNOSIS — J189 Pneumonia, unspecified organism: Secondary | ICD-10-CM | POA: Diagnosis not present

## 2021-09-19 DIAGNOSIS — Z79891 Long term (current) use of opiate analgesic: Secondary | ICD-10-CM

## 2021-09-19 DIAGNOSIS — R0603 Acute respiratory distress: Secondary | ICD-10-CM

## 2021-09-19 LAB — I-STAT VENOUS BLOOD GAS, ED
Acid-Base Excess: 12 mmol/L — ABNORMAL HIGH (ref 0.0–2.0)
Bicarbonate: 39.9 mmol/L — ABNORMAL HIGH (ref 20.0–28.0)
Calcium, Ion: 1.05 mmol/L — ABNORMAL LOW (ref 1.15–1.40)
HCT: 30 % — ABNORMAL LOW (ref 36.0–46.0)
Hemoglobin: 10.2 g/dL — ABNORMAL LOW (ref 12.0–15.0)
O2 Saturation: 78 %
Potassium: 5.7 mmol/L — ABNORMAL HIGH (ref 3.5–5.1)
Sodium: 135 mmol/L (ref 135–145)
TCO2: 42 mmol/L — ABNORMAL HIGH (ref 22–32)
pCO2, Ven: 75.1 mmHg (ref 44.0–60.0)
pH, Ven: 7.333 (ref 7.250–7.430)
pO2, Ven: 48 mmHg — ABNORMAL HIGH (ref 32.0–45.0)

## 2021-09-19 LAB — COMPREHENSIVE METABOLIC PANEL
ALT: 26 U/L (ref 0–44)
AST: 32 U/L (ref 15–41)
Albumin: 3.1 g/dL — ABNORMAL LOW (ref 3.5–5.0)
Alkaline Phosphatase: 114 U/L (ref 38–126)
Anion gap: 10 (ref 5–15)
BUN: 8 mg/dL (ref 6–20)
CO2: 33 mmol/L — ABNORMAL HIGH (ref 22–32)
Calcium: 7.9 mg/dL — ABNORMAL LOW (ref 8.9–10.3)
Chloride: 95 mmol/L — ABNORMAL LOW (ref 98–111)
Creatinine, Ser: 0.66 mg/dL (ref 0.44–1.00)
GFR, Estimated: >60 mL/min (ref >60)
Glucose, Bld: 180 mg/dL — ABNORMAL HIGH (ref 70–99)
Potassium: 4.2 mmol/L (ref 3.5–5.1)
Sodium: 138 mmol/L (ref 135–145)
Total Bilirubin: 0.4 mg/dL (ref 0.3–1.2)
Total Protein: 5.7 g/dL — ABNORMAL LOW (ref 6.5–8.1)

## 2021-09-19 LAB — CBC WITH DIFFERENTIAL/PLATELET
Abs Immature Granulocytes: 0.3 10*3/uL — ABNORMAL HIGH (ref 0.00–0.07)
Basophils Absolute: 0.1 10*3/uL (ref 0.0–0.1)
Basophils Relative: 0 %
Eosinophils Absolute: 0 10*3/uL (ref 0.0–0.5)
Eosinophils Relative: 0 %
HCT: 30.6 % — ABNORMAL LOW (ref 36.0–46.0)
Hemoglobin: 8.5 g/dL — ABNORMAL LOW (ref 12.0–15.0)
Immature Granulocytes: 1 %
Lymphocytes Relative: 2 %
Lymphs Abs: 0.4 10*3/uL — ABNORMAL LOW (ref 0.7–4.0)
MCH: 23.9 pg — ABNORMAL LOW (ref 26.0–34.0)
MCHC: 27.8 g/dL — ABNORMAL LOW (ref 30.0–36.0)
MCV: 86 fL (ref 80.0–100.0)
Monocytes Absolute: 0.3 10*3/uL (ref 0.1–1.0)
Monocytes Relative: 1 %
Neutro Abs: 22.9 10*3/uL — ABNORMAL HIGH (ref 1.7–7.7)
Neutrophils Relative %: 96 %
Platelets: 383 10*3/uL (ref 150–400)
RBC: 3.56 MIL/uL — ABNORMAL LOW (ref 3.87–5.11)
RDW: 15.8 % — ABNORMAL HIGH (ref 11.5–15.5)
WBC: 23.9 10*3/uL — ABNORMAL HIGH (ref 4.0–10.5)
nRBC: 0 % (ref 0.0–0.2)

## 2021-09-19 LAB — BRAIN NATRIURETIC PEPTIDE: B Natriuretic Peptide: 908.7 pg/mL — ABNORMAL HIGH (ref 0.0–100.0)

## 2021-09-19 LAB — CBG MONITORING, ED: Glucose-Capillary: 204 mg/dL — ABNORMAL HIGH (ref 70–99)

## 2021-09-19 LAB — MAGNESIUM: Magnesium: 1.4 mg/dL — ABNORMAL LOW (ref 1.7–2.4)

## 2021-09-19 LAB — TROPONIN I (HIGH SENSITIVITY)
Troponin I (High Sensitivity): 30 ng/L — ABNORMAL HIGH (ref <18)
Troponin I (High Sensitivity): 34 ng/L — ABNORMAL HIGH (ref <18)

## 2021-09-19 MED ORDER — METHYLPREDNISOLONE SODIUM SUCC 125 MG IJ SOLR
125.0000 mg | Freq: Once | INTRAMUSCULAR | Status: AC
  Administered 2021-09-19: 125 mg via INTRAVENOUS
  Filled 2021-09-19: qty 2

## 2021-09-19 MED ORDER — ALBUTEROL SULFATE (2.5 MG/3ML) 0.083% IN NEBU
12.0000 mL | INHALATION_SOLUTION | Freq: Once | RESPIRATORY_TRACT | Status: AC
  Administered 2021-09-19: 12 mL via RESPIRATORY_TRACT
  Filled 2021-09-19: qty 12

## 2021-09-19 MED ORDER — DIPHENHYDRAMINE HCL 50 MG/ML IJ SOLN
25.0000 mg | Freq: Once | INTRAMUSCULAR | Status: AC
  Administered 2021-09-19: 25 mg via INTRAVENOUS
  Filled 2021-09-19: qty 1

## 2021-09-19 MED ORDER — SODIUM CHLORIDE 0.9 % IV SOLN
500.0000 mg | Freq: Once | INTRAVENOUS | Status: AC
  Administered 2021-09-19: 500 mg via INTRAVENOUS
  Filled 2021-09-19: qty 5

## 2021-09-19 MED ORDER — MAGNESIUM SULFATE 2 GM/50ML IV SOLN
2.0000 g | Freq: Once | INTRAVENOUS | Status: AC
  Administered 2021-09-19: 2 g via INTRAVENOUS
  Filled 2021-09-19: qty 50

## 2021-09-19 MED ORDER — SODIUM CHLORIDE 0.9 % IV SOLN
1.0000 g | Freq: Once | INTRAVENOUS | Status: AC
  Administered 2021-09-19: 1 g via INTRAVENOUS
  Filled 2021-09-19: qty 10

## 2021-09-19 NOTE — ED Provider Notes (Signed)
[name]   CSN: [id number] Arrival date & time: 09/19/21  1848     History  Chief Complaint  Patient presents with   Shortness of Breath    Leira Georgia DuffS Nordby is a 56 y.o. female.  HPI Patient presents for worsening shortness of breath over the past several days.  She arrives from home by EMS.  She was reportedly drowsy and lethargic.  She has been utilizing nebulized breathing treatments at home.  EMS initiated CPAP.  Per chart review, medical history significant for diastolic CHF, COPD (on 4 L home oxygen), HTN, aortic stenosis, HLD, CAD, and OSA.  She had a hospitalization last June for hypercarbic failure requiring BiPAP.  Patient endorses chronic pain in her left shoulder but denies any acute pain.  History per husband: Patient has been on hospice since her last hospitalization in the summer.  He states that she wanted to go on hospice to avoid any further hospitalizations.  Over the past several days, she has had periods of insomnia followed by periods of prolonged sleeping.  She has had increased use of nebulized breathing treatments.  She has had worsening shortness of breath.  She uses oxygen by nasal cannula at baseline.  Her difficult dose is 4 L but patient's husband has been increasing this amount over the past several days.  Earlier, prior to arrival, patient was in severe respiratory distress at home which prompted him to call EMS.  She has had a recent worsening cough.    Home Medications Prior to Admission medications   Medication Sig Start Date End Date Taking? Authorizing Provider  albuterol (PROVENTIL) (2.5 MG/3ML) 0.083% nebulizer solution Inhale 3 mLs into the lungs every 4 (four) hours. 01/30/21  Yes [provider]  cetirizine (ZYRTEC) 10 MG tablet Take 10 mg daily by mouth.   Yes [provider]  DIFLUCAN 100 MG tablet Take 100 mg by mouth daily. 09/12/21  Yes [provider]  doxycycline  (VIBRA-TABS) 100 MG tablet Take by mouth. 09/14/21 09/24/21 Yes [provider]  DULoxetine (CYMBALTA) 30 MG capsule Take 30 mg by mouth 2 (two) times daily. 07/12/20  Yes [provider]  fluticasone (FLONASE) 50 MCG/ACT nasal spray Place 1 spray into both nostrils daily as needed for rhinitis or allergies.   Yes [provider]  furosemide (LASIX) 20 MG tablet Take 1 tablet (20 mg total) by mouth daily as needed for edema (weight gain of 3lbs in 1 day or 5 lbs in 2 days.). 07/27/20  Yes Rolly SalterPatel, Pranav M, MD  gabapentin (NEURONTIN) 400 MG capsule Take 400 mg by mouth 3 (three) times daily. 08/09/20  Yes [provider]  HYDROcodone-acetaminophen (NORCO) 10-325 MG tablet Take 1 tablet by mouth every 6 (six) hours as needed (breakthrough pain). 04/02/19  Yes Swinteck, Arlys JohnBrian, MD  ipratropium-albuterol (DUONEB) 0.5-2.5 (3) MG/3ML SOLN Take 3 mLs by nebulization 4 (four) times daily. 09/14/21  Yes [provider]  montelukast (SINGULAIR) 10 MG tablet Take 10 mg at bedtime by mouth.   Yes [provider]  morphine (MS CONTIN) 30 MG 12 hr tablet Take 30 mg by mouth every 12 (twelve) hours.   Yes [provider]  morphine 10 MG/5ML solution Take 2.5 mg by mouth every 4 (four) hours as needed for severe pain.   Yes [provider]  nicotine (NICODERM CQ - DOSED IN MG/24 HOURS) 21 mg/24hr patch Place 21 mg onto the skin daily. 01/31/21  Yes  [provider]  nystatin-triamcinolone ointment (MYCOLOG) Apply 1 application topically 2 (two) times daily as needed (rash). 10/16/20  Yes [provider]  OXYGEN Inhale 4-6 L/min into the lungs See admin instructions. 4 L/min at rest and 6 L/min when exerted   Yes [provider]  pantoprazole (PROTONIX) 40 MG tablet Take 40 mg by mouth 2 (two) times daily. 08/16/20  Yes [provider]  polyethylene glycol powder (GLYCOLAX/MIRALAX) 17 GM/SCOOP powder Take 17 g by mouth daily as  needed for mild constipation. 09/04/20  Yes [provider]  predniSONE (DELTASONE) 20 MG tablet Take 20 mg by mouth daily. 08/27/21  Yes [provider]  SANTYL ointment Apply 1 application topically See admin instructions. Apply daily as directed to wound on left shin 02/21/21  Yes [provider]  Vitamin D, Ergocalciferol, (DRISDOL) 1.25 MG (50000 UNIT) CAPS capsule Take 50,000 Units by mouth every Monday. 06/15/20  Yes [provider]  senna-docusate (SENOKOT-S) 8.6-50 MG tablet Take 2 tablets by mouth 2 (two) times daily. Patient not taking: Reported on 09/20/2021 11/20/20   Kathlen Mody, MD  traZODone (DESYREL) 50 MG tablet Take 1 tablet (50 mg total) by mouth at bedtime as needed for sleep. Patient not taking: Reported on 09/20/2021 11/20/20   Kathlen Mody, MD  zinc sulfate 220 (50 Zn) MG capsule Take 1 capsule (220 mg total) by mouth daily. Patient not taking: Reported on 09/20/2021 11/21/20   Kathlen Mody, MD      Allergies    Amoxicillin-pot clavulanate, Erythromycin, Moxifloxacin, and Sulfamethoxazole-trimethoprim    Review of Systems   Review of Systems  Unable to perform ROS: Severe respiratory distress   Physical Exam Updated Vital Signs BP 109/74 (BP Location: Right Arm)    Pulse 94    Temp 97.6 F (36.4 C) (Oral)    Resp 18    Ht 5\' 3"  (1.6 m)    Wt 73 kg    SpO2 100%    BMI 28.51 kg/m  Physical Exam Vitals and nursing note reviewed.  Constitutional:      General: She is in acute distress.     Appearance: She is well-developed. She is ill-appearing. She is not toxic-appearing or diaphoretic.  HENT:     Head: Normocephalic and atraumatic.  Eyes:     Conjunctiva/sclera: Conjunctivae normal.  Cardiovascular:     Rate and Rhythm: Normal rate and regular rhythm.     Heart sounds: No murmur heard. Pulmonary:     Effort: Prolonged expiration and respiratory distress present.     Breath sounds: Decreased air movement present. Decreased  breath sounds and wheezing present.     Comments: On Bipap Chest:     Chest wall: No tenderness.  Abdominal:     Palpations: Abdomen is soft.     Tenderness: There is no abdominal tenderness.  Musculoskeletal:        General: No swelling.     Cervical back: Normal range of motion and neck supple.     Right lower leg: Edema present.     Left lower leg: Edema present.     Comments: Edema with circumferential bandaging to bilateral lower extremities.  Skin:    General: Skin is warm and dry.     Capillary Refill: Capillary refill takes less than 2 seconds.  Neurological:     General: No focal deficit present.     Mental Status: She is alert.  Psychiatric:        Mood and Affect:  Mood normal.        Behavior: Behavior normal.    ED Results / Procedures / Treatments   Labs (all labs ordered are listed, but only abnormal results are displayed) Labs Reviewed  COMPREHENSIVE METABOLIC PANEL - Abnormal; Notable for the following components:      Result Value   Chloride 95 (*)    CO2 33 (*)    Glucose, Bld 180 (*)    Calcium 7.9 (*)    Total Protein 5.7 (*)    Albumin 3.1 (*)    All other components within normal limits  MAGNESIUM - Abnormal; Notable for the following components:   Magnesium 1.4 (*)    All other components within normal limits  CBC WITH DIFFERENTIAL/PLATELET - Abnormal; Notable for the following components:   WBC 23.9 (*)    RBC 3.56 (*)    Hemoglobin 8.5 (*)    HCT 30.6 (*)    MCH 23.9 (*)    MCHC 27.8 (*)    RDW 15.8 (*)    Neutro Abs 22.9 (*)    Lymphs Abs 0.4 (*)    Abs Immature Granulocytes 0.30 (*)    All other components within normal limits  BRAIN NATRIURETIC PEPTIDE - Abnormal; Notable for the following components:   B Natriuretic Peptide 908.7 (*)    All other components within normal limits  STREP PNEUMONIAE URINARY ANTIGEN - Abnormal; Notable for the following components:   Strep Pneumo Urinary Antigen POSITIVE (*)    All other components  within normal limits  BASIC METABOLIC PANEL - Abnormal; Notable for the following components:   Chloride 94 (*)    Glucose, Bld 142 (*)    Calcium 8.1 (*)    All other components within normal limits  CBC - Abnormal; Notable for the following components:   WBC 14.7 (*)    RBC 3.75 (*)    Hemoglobin 8.6 (*)    HCT 32.6 (*)    MCH 22.9 (*)    MCHC 26.4 (*)    RDW 15.9 (*)    All other components within normal limits  MAGNESIUM - Abnormal; Notable for the following components:   Magnesium 1.5 (*)    All other components within normal limits  CBG MONITORING, ED - Abnormal; Notable for the following components:   Glucose-Capillary 204 (*)    All other components within normal limits  I-STAT VENOUS BLOOD GAS, ED - Abnormal; Notable for the following components:   pCO2, Ven 75.1 (*)    pO2, Ven 48.0 (*)    Bicarbonate 39.9 (*)    TCO2 42 (*)    Acid-Base Excess 12.0 (*)    Potassium 5.7 (*)    Calcium, Ion 1.05 (*)    HCT 30.0 (*)    Hemoglobin 10.2 (*)    All other components within normal limits  TROPONIN I (HIGH SENSITIVITY) - Abnormal; Notable for the following components:   Troponin I (High Sensitivity) 30 (*)    All other components within normal limits  TROPONIN I (HIGH SENSITIVITY) - Abnormal; Notable for the following components:   Troponin I (High Sensitivity) 34 (*)    All other components within normal limits  RESP PANEL BY RT-PCR (FLU A&B, COVID) ARPGX2  EXPECTORATED SPUTUM ASSESSMENT W GRAM STAIN, RFLX TO RESP C  HIV ANTIBODY (ROUTINE TESTING W REFLEX)  LEGIONELLA PNEUMOPHILA SEROGP 1 UR AG  BASIC METABOLIC PANEL  CBC  MAGNESIUM    EKG EKG Interpretation  Date/Time:  Wednesday September 19 2021 18:52:57 EST Ventricular Rate:  107 PR Interval:  149 QRS Duration: 81 QT Interval:  339 QTC Calculation: 453 R Axis:   24 Text Interpretation: Sinus tachycardia Left ventricular hypertrophy Borderline T abnormalities, inferior leads Confirmed by Gloris Manchester [phone])  on 09/19/2021 7:23:24 PM  Radiology DG Chest Port 1 View  Result Date: 09/19/2021 CLINICAL DATA:  Dyspnea EXAM: PORTABLE CHEST 1 VIEW COMPARISON:  02/25/2021, CT 11/11/2020 FINDINGS: Emphysema and chronic interstitial lung disease. Increased right greater than left basilar airspace opacity. Cardiomegaly. No pneumothorax. IMPRESSION: 1. Underlying emphysema and chronic lung disease. Increased right greater than left basilar airspace opacity concerning for superimposed pneumonia and possible right effusion 2. Cardiomegaly with mild vascular congestion Electronically Signed   By: Jasmine Pang M.D.   On: 09/19/2021 19:39    Procedures Procedures    Medications Ordered in ED Medications  enoxaparin (LOVENOX) injection 40 mg (40 mg Subcutaneous Given 09/20/21 1045)  cefTRIAXone (ROCEPHIN) 2 g in sodium chloride 0.9 % 100 mL IVPB (has no administration in time range)  azithromycin (ZITHROMAX) 500 mg in sodium chloride 0.9 % 250 mL IVPB (has no administration in time range)  ipratropium-albuterol (DUONEB) 0.5-2.5 (3) MG/3ML nebulizer solution 3 mL (3 mLs Nebulization Given 09/20/21 1704)  albuterol (PROVENTIL) (2.5 MG/3ML) 0.083% nebulizer solution 2.5 mg (has no administration in time range)  furosemide (LASIX) injection 20 mg (20 mg Intravenous Given 09/20/21 1657)  sodium chloride flush (NS) 0.9 % injection 3 mL (3 mLs Intravenous Not Given 09/20/21 0956)  HYDROmorphone (DILAUDID) injection 0.5-1 mg (has no administration in time range)  ondansetron (ZOFRAN) tablet 4 mg (has no administration in time range)    Or  ondansetron (ZOFRAN) injection 4 mg (has no administration in time range)  methylPREDNISolone sodium succinate (SOLU-MEDROL) 125 mg/2 mL injection 60 mg (has no administration in time range)  arformoterol (BROVANA) nebulizer solution 15 mcg (15 mcg Nebulization Given 09/20/21 1253)  budesonide (PULMICORT) nebulizer solution 0.25 mg (0.25 mg Nebulization Given 09/20/21 1045)  loratadine  (CLARITIN) tablet 10 mg (10 mg Oral Given 09/20/21 1048)  fluconazole (DIFLUCAN) tablet 100 mg (100 mg Oral Not Given 09/20/21 1657)  DULoxetine (CYMBALTA) DR capsule 30 mg (30 mg Oral Given 09/20/21 1045)  gabapentin (NEURONTIN) capsule 400 mg (400 mg Oral Given 09/20/21 1700)  HYDROcodone-acetaminophen (NORCO) 10-325 MG per tablet 1 tablet (has no administration in time range)  montelukast (SINGULAIR) tablet 10 mg (has no administration in time range)  morphine (MS CONTIN) 12 hr tablet 30 mg (30 mg Oral Given 09/20/21 1046)  nicotine (NICODERM CQ - dosed in mg/24 hours) patch 21 mg (21 mg Transdermal Patch Applied 09/20/21 1048)  pantoprazole (PROTONIX) EC tablet 40 mg (40 mg Oral Given 09/20/21 1107)  polyethylene glycol (MIRALAX / GLYCOLAX) packet 17 g (has no administration in time range)  magnesium sulfate IVPB 4 g 100 mL (has no administration in time range)  albuterol (PROVENTIL) (2.5 MG/3ML) 0.083% nebulizer solution 12 mL (12 mLs Nebulization Given 09/19/21 1925)  methylPREDNISolone sodium succinate (SOLU-MEDROL) 125 mg/2 mL injection 125 mg (125 mg Intravenous Given 09/19/21 1936)  cefTRIAXone (ROCEPHIN) 1 g in sodium chloride 0.9 % 100 mL IVPB (0 g Intravenous Stopped 09/19/21 2103)  azithromycin (ZITHROMAX) 500 mg in sodium chloride 0.9 % 250 mL IVPB (0 mg Intravenous Stopped 09/19/21 2224)  diphenhydrAMINE (BENADRYL) injection 25 mg (25 mg Intravenous Given 09/19/21 2047)  magnesium sulfate IVPB 2 g 50 mL (0 g Intravenous Stopped 09/19/21 2250)  magnesium sulfate IVPB 1 g 100  mL (0 g Intravenous Stopped 09/20/21 0158)    ED Course/ Medical Decision Making/ A&P                           Medical Decision Making  This patient presents to the ED for concern of respiratory distress, this involves an extensive number of treatment options, and is a complaint that carries with it a high risk of complications and morbidity.  The differential diagnosis includes CHF exacerbation, COPD exacerbation,  viral illness, pneumonia, volume overload, PE, pneumothorax   Co morbidities that complicate the patient evaluation  diastolic CHF, COPD (on 4 L home oxygen), HTN, aortic stenosis, HLD, CAD, and OSA.    Additional history obtained:  Additional history obtained from patient's husband External records from outside source obtained and reviewed including EMR   Lab Tests:  I Ordered, and personally interpreted labs.  The pertinent results include: Hypercarbia, elevated BNP, leukocytosis, worsened anemia, hypomagnesemia   Imaging Studies ordered:  I ordered imaging studies including chest x-ray I independently visualized and interpreted imaging which showed vascular congestion, emphysema, right-sided pneumonia with right-sided pleural effusion I agree with the radiologist interpretation   Cardiac Monitoring:  The patient was maintained on a cardiac monitor.  I personally viewed and interpreted the cardiac monitored which showed an underlying rhythm of: Sinus rhythm   Medicines ordered and prescription drug management:  I ordered medication including nebulized breathing treatments and Solu-Medrol for treatment of COPD exacerbation; broad-spectrum antibiotics for empiric treatment of pneumonia IV magnesium for hypomagnesemia Reevaluation of the patient after these medicines showed that the patient improved I have reviewed the patients home medicines and have made adjustments as needed  Critical Interventions:  BiPAP for respiratory distress, medications for treatment of acute infection and COPD exacerbation  Problem List / ED Course:  56 year old female presenting from home, where she is on hospice care, for respiratory distress.  Prior to arrival, she was started on CPAP.  She had improvement in work of breathing.  On arrival, she remains in respiratory distress.  BiPAP was initiated.  Patient has diffuse wheezing and diminished air sounds on lung auscultation.  DuoNeb breathing  treatment and Solu-Medrol were ordered.  Chest x-ray was obtained which showed concern for right-sided pneumonia.  Broad-spectrum antibiotics were ordered.  Patient was somnolent but was arousable.  She was able to answer questions and did confirm that she would like to be DNR/DNI.  Shortly thereafter, patient's husband joined her bedside.  He confirmed CODE STATUS as well.  Despite improvement, patient remained on BiPAP.  She was admitted to hospitalist for further management.   Reevaluation:  After the interventions noted above, I reevaluated the patient and found that they have :improved   Social Determinants of Health:  Patient is chronically ill and currently lives at home on hospice care.   Dispostion:  After consideration of the diagnostic results and the patients response to treatment, I feel that the patent would benefit from admission to hospital.    CRITICAL CARE Performed by: Gloris Manchester   Total critical care time: 35 minutes  Critical care time was exclusive of separately billable procedures and treating other patients.  Critical care was necessary to treat or prevent imminent or life-threatening deterioration.  Critical care was time spent personally by me on the following activities: development of treatment plan with patient and/or surrogate as well as nursing, discussions with consultants, evaluation of patient's response to treatment, examination of patient, obtaining history  from patient or surrogate, ordering and performing treatments and interventions, ordering and review of laboratory studies, ordering and review of radiographic studies, pulse oximetry and re-evaluation of patient's condition.        Final Clinical Impression(s) / ED Diagnoses Final diagnoses:  COPD exacerbation (HCC)  Respiratory distress  Acute respiratory failure with hypoxia and hypercapnia [facility])    Rx / DC Orders ED Discharge Orders     None         Gloris Manchester,  MD 09/20/21 1818

## 2021-09-19 NOTE — ED Notes (Signed)
The pts  husbnad is sitting at  the bedside

## 2021-09-19 NOTE — ED Triage Notes (Signed)
The pt arrived by gems from home  pt c/o sob all day  .  Worse this evening  she arrived on c-pap respiratory therapy placed the pt on bi-pap  the pt arrived very anxious    the pt is ?? On hospice care  the pt was given sl nitro  cbg 266  alert

## 2021-09-20 ENCOUNTER — Encounter (HOSPITAL_COMMUNITY): Payer: Self-pay | Admitting: Family Medicine

## 2021-09-20 DIAGNOSIS — J189 Pneumonia, unspecified organism: Secondary | ICD-10-CM

## 2021-09-20 LAB — CBC
HCT: 32.6 % — ABNORMAL LOW (ref 36.0–46.0)
Hemoglobin: 8.6 g/dL — ABNORMAL LOW (ref 12.0–15.0)
MCH: 22.9 pg — ABNORMAL LOW (ref 26.0–34.0)
MCHC: 26.4 g/dL — ABNORMAL LOW (ref 30.0–36.0)
MCV: 86.9 fL (ref 80.0–100.0)
Platelets: 367 10*3/uL (ref 150–400)
RBC: 3.75 MIL/uL — ABNORMAL LOW (ref 3.87–5.11)
RDW: 15.9 % — ABNORMAL HIGH (ref 11.5–15.5)
WBC: 14.7 10*3/uL — ABNORMAL HIGH (ref 4.0–10.5)
nRBC: 0 % (ref 0.0–0.2)

## 2021-09-20 LAB — BASIC METABOLIC PANEL
Anion gap: 14 (ref 5–15)
BUN: 6 mg/dL (ref 6–20)
CO2: 32 mmol/L (ref 22–32)
Calcium: 8.1 mg/dL — ABNORMAL LOW (ref 8.9–10.3)
Chloride: 94 mmol/L — ABNORMAL LOW (ref 98–111)
Creatinine, Ser: 0.55 mg/dL (ref 0.44–1.00)
GFR, Estimated: >60 mL/min (ref >60)
Glucose, Bld: 142 mg/dL — ABNORMAL HIGH (ref 70–99)
Potassium: 4.2 mmol/L (ref 3.5–5.1)
Sodium: 140 mmol/L (ref 135–145)

## 2021-09-20 LAB — RESP PANEL BY RT-PCR (FLU A&B, COVID) ARPGX2
Influenza A by PCR: NEGATIVE
Influenza B by PCR: NEGATIVE
SARS Coronavirus 2 by RT PCR: NEGATIVE

## 2021-09-20 LAB — MAGNESIUM: Magnesium: 1.5 mg/dL — ABNORMAL LOW (ref 1.7–2.4)

## 2021-09-20 LAB — STREP PNEUMONIAE URINARY ANTIGEN: Strep Pneumo Urinary Antigen: POSITIVE — AB

## 2021-09-20 MED ORDER — SODIUM CHLORIDE 0.9 % IV SOLN
500.0000 mg | INTRAVENOUS | Status: DC
  Administered 2021-09-20: 500 mg via INTRAVENOUS
  Filled 2021-09-20: qty 5

## 2021-09-20 MED ORDER — ARFORMOTEROL TARTRATE 15 MCG/2ML IN NEBU
15.0000 ug | INHALATION_SOLUTION | Freq: Two times a day (BID) | RESPIRATORY_TRACT | Status: DC
  Administered 2021-09-20 – 2021-09-22 (×5): 15 ug via RESPIRATORY_TRACT
  Filled 2021-09-20 (×5): qty 2

## 2021-09-20 MED ORDER — ALBUTEROL SULFATE (2.5 MG/3ML) 0.083% IN NEBU: 2.5000 mg | INHALATION_SOLUTION | RESPIRATORY_TRACT | Status: DC | PRN

## 2021-09-20 MED ORDER — SODIUM CHLORIDE 0.9% FLUSH
3.0000 mL | Freq: Two times a day (BID) | INTRAVENOUS | Status: DC
  Administered 2021-09-20 – 2021-09-22 (×4): 3 mL via INTRAVENOUS

## 2021-09-20 MED ORDER — FUROSEMIDE 10 MG/ML IJ SOLN
20.0000 mg | Freq: Two times a day (BID) | INTRAMUSCULAR | Status: DC
  Administered 2021-09-20 – 2021-09-21 (×5): 20 mg via INTRAVENOUS
  Filled 2021-09-20 (×5): qty 2

## 2021-09-20 MED ORDER — PANTOPRAZOLE SODIUM 40 MG PO TBEC
40.0000 mg | DELAYED_RELEASE_TABLET | Freq: Two times a day (BID) | ORAL | Status: DC
  Administered 2021-09-20 – 2021-09-22 (×5): 40 mg via ORAL
  Filled 2021-09-20 (×5): qty 1

## 2021-09-20 MED ORDER — METHYLPREDNISOLONE SODIUM SUCC 125 MG IJ SOLR
125.0000 mg | Freq: Two times a day (BID) | INTRAMUSCULAR | Status: DC
  Administered 2021-09-20: 125 mg via INTRAVENOUS
  Filled 2021-09-20: qty 2

## 2021-09-20 MED ORDER — BUDESONIDE 0.25 MG/2ML IN SUSP
0.2500 mg | Freq: Two times a day (BID) | RESPIRATORY_TRACT | Status: DC
  Administered 2021-09-20 – 2021-09-22 (×5): 0.25 mg via RESPIRATORY_TRACT
  Filled 2021-09-20 (×5): qty 2

## 2021-09-20 MED ORDER — POLYETHYLENE GLYCOL 3350 17 G PO PACK: 17.0000 g | PACK | Freq: Every day | ORAL | Status: DC | PRN

## 2021-09-20 MED ORDER — METHYLPREDNISOLONE SODIUM SUCC 125 MG IJ SOLR
60.0000 mg | Freq: Two times a day (BID) | INTRAMUSCULAR | Status: DC
  Administered 2021-09-20 – 2021-09-21 (×2): 60 mg via INTRAVENOUS
  Filled 2021-09-20 (×2): qty 2

## 2021-09-20 MED ORDER — SODIUM CHLORIDE 0.9 % IV SOLN
2.0000 g | INTRAVENOUS | Status: DC
  Administered 2021-09-20 – 2021-09-21 (×2): 2 g via INTRAVENOUS
  Filled 2021-09-20 (×2): qty 20

## 2021-09-20 MED ORDER — ENOXAPARIN SODIUM 40 MG/0.4ML IJ SOSY
40.0000 mg | PREFILLED_SYRINGE | INTRAMUSCULAR | Status: DC
  Administered 2021-09-20 – 2021-09-22 (×3): 40 mg via SUBCUTANEOUS
  Filled 2021-09-20 (×3): qty 0.4

## 2021-09-20 MED ORDER — IPRATROPIUM-ALBUTEROL 0.5-2.5 (3) MG/3ML IN SOLN
3.0000 mL | Freq: Four times a day (QID) | RESPIRATORY_TRACT | Status: DC
  Administered 2021-09-20 – 2021-09-21 (×6): 3 mL via RESPIRATORY_TRACT
  Filled 2021-09-20 (×6): qty 3

## 2021-09-20 MED ORDER — MORPHINE SULFATE ER 15 MG PO TBCR
30.0000 mg | EXTENDED_RELEASE_TABLET | Freq: Two times a day (BID) | ORAL | Status: DC
  Administered 2021-09-20 – 2021-09-22 (×5): 30 mg via ORAL
  Filled 2021-09-20 (×3): qty 2
  Filled 2021-09-20: qty 1
  Filled 2021-09-20: qty 2

## 2021-09-20 MED ORDER — HYDROCODONE-ACETAMINOPHEN 10-325 MG PO TABS
1.0000 | ORAL_TABLET | Freq: Four times a day (QID) | ORAL | Status: DC | PRN
  Administered 2021-09-20 – 2021-09-22 (×5): 1 via ORAL
  Filled 2021-09-20 (×5): qty 1

## 2021-09-20 MED ORDER — DULOXETINE HCL 30 MG PO CPEP
30.0000 mg | ORAL_CAPSULE | Freq: Two times a day (BID) | ORAL | Status: DC
  Administered 2021-09-20 – 2021-09-22 (×5): 30 mg via ORAL
  Filled 2021-09-20 (×6): qty 1

## 2021-09-20 MED ORDER — MONTELUKAST SODIUM 10 MG PO TABS
10.0000 mg | ORAL_TABLET | Freq: Every day | ORAL | Status: DC
  Administered 2021-09-20 – 2021-09-21 (×2): 10 mg via ORAL
  Filled 2021-09-20 (×2): qty 1

## 2021-09-20 MED ORDER — GABAPENTIN 400 MG PO CAPS
400.0000 mg | ORAL_CAPSULE | Freq: Three times a day (TID) | ORAL | Status: DC
  Administered 2021-09-20 – 2021-09-22 (×7): 400 mg via ORAL
  Filled 2021-09-20 (×7): qty 1

## 2021-09-20 MED ORDER — MAGNESIUM SULFATE 4 GM/100ML IV SOLN
4.0000 g | Freq: Once | INTRAVENOUS | Status: AC
  Administered 2021-09-20: 4 g via INTRAVENOUS
  Filled 2021-09-20: qty 100

## 2021-09-20 MED ORDER — LORATADINE 10 MG PO TABS
10.0000 mg | ORAL_TABLET | Freq: Every day | ORAL | Status: DC
  Administered 2021-09-20 – 2021-09-22 (×3): 10 mg via ORAL
  Filled 2021-09-20 (×3): qty 1

## 2021-09-20 MED ORDER — ONDANSETRON HCL 4 MG/2ML IJ SOLN: 4.0000 mg | Freq: Four times a day (QID) | INTRAMUSCULAR | Status: DC | PRN

## 2021-09-20 MED ORDER — NICOTINE 21 MG/24HR TD PT24
21.0000 mg | MEDICATED_PATCH | Freq: Every day | TRANSDERMAL | Status: DC
  Administered 2021-09-20 – 2021-09-22 (×3): 21 mg via TRANSDERMAL
  Filled 2021-09-20 (×3): qty 1

## 2021-09-20 MED ORDER — FLUCONAZOLE 100 MG PO TABS
100.0000 mg | ORAL_TABLET | Freq: Every day | ORAL | Status: DC
  Administered 2021-09-21 – 2021-09-22 (×2): 100 mg via ORAL
  Filled 2021-09-20 (×3): qty 1

## 2021-09-20 MED ORDER — PREDNISONE 20 MG PO TABS: 40.0000 mg | ORAL_TABLET | Freq: Every day | ORAL | Status: DC

## 2021-09-20 MED ORDER — ONDANSETRON HCL 4 MG PO TABS: 4.0000 mg | ORAL_TABLET | Freq: Four times a day (QID) | ORAL | Status: DC | PRN

## 2021-09-20 MED ORDER — MAGNESIUM SULFATE IN D5W 1-5 GM/100ML-% IV SOLN
1.0000 g | Freq: Once | INTRAVENOUS | Status: AC
  Administered 2021-09-20: 1 g via INTRAVENOUS
  Filled 2021-09-20: qty 100

## 2021-09-20 MED ORDER — HYDROMORPHONE HCL 1 MG/ML IJ SOLN
0.5000 mg | INTRAMUSCULAR | Status: DC | PRN
  Administered 2021-09-21 (×2): 1 mg via INTRAVENOUS
  Administered 2021-09-21: 0.5 mg via INTRAVENOUS
  Administered 2021-09-22: 1 mg via INTRAVENOUS
  Filled 2021-09-20: qty 1
  Filled 2021-09-20: qty 0.5
  Filled 2021-09-20 (×3): qty 1

## 2021-09-20 NOTE — Progress Notes (Signed)
Civil engineer, [employer] [facility]) Hospital Liaison note.    This is a current [facility] hospice patient with a terminal diagnosis of COPD. [facility] liaison will follow for disposition and coordination of care.  Please do not hesitate to call with questions.   Thank you,   Elsie Saas, RN, [facility]      [facility] Liaison   [phone]

## 2021-09-20 NOTE — Consult Note (Signed)
WOC Nurse Consult Note: Reason for Consult: Chronic, nonhealing LE wounds.  R>L. Husband wraps for her at home. Wound type: venous insufficiency vs mixed etiology wounds Pressure Injury POA: N/A Measurement: Right LE 2.5cm x 3cm x 0.2cm (full thickness) 2cm x 2.5cm x 0.2cm (full thickness) Left LE 1.5cm x 1cm x 0.1cm (partial thickness)  Wound beds:pale pink, moist Drainage (amount, consistency, odor) scant (left) to small (right ) amount of serous exudate Periwound: with evidence of fluid loss i.e., deep ridges on left LE. Red (rubor) noted to RLE Dressing procedure/placement/frequency: I will implement a conservative POC for LE wounds using a daily cleanse, followed by application of a nonadherent antimicrobial (xeroform). This will be topped with dry dressings and secured with a Kerlix roll gauze wrap.  Flotation of the heels and correction of the lateral rotatino to be provided by Prevalon boots.  A sacral foam is to be used for PI prevention.  Referral to the patient's PCP, a HHRN or to the outpatient Wound Care Center of the patient's choosing is recommended post discharge.  WOC nursing team will not follow, but will remain available to this patient, the nursing and medical teams.  Please re-consult if needed. Thanks, Ladona Mow, MSN, RN, GNP, [facility]  Pager# [phone]

## 2021-09-20 NOTE — Progress Notes (Addendum)
Pt taken off BiPAP per MD and placed on 7L salter. Pt states that is what she wears at home. Pt is tolerating well, RT will continue to monitor.

## 2021-09-20 NOTE — ED Notes (Signed)
The pt has been sleeping most of the shift . She has not complained of pain

## 2021-09-20 NOTE — Progress Notes (Signed)
Patient admitted to [address] room 11. VSS. Heart monitor applied and CCMD notified. Patient oriented to room with call bell and phone within reach. Will continue to assess Nichole Corrigan, RN

## 2021-09-20 NOTE — Progress Notes (Signed)
Heart Failure Navigator Progress Note  Assessed for Heart & Vascular TOC clinic readiness.  Patient does not meet criteria due to lives at home with hospice.   Kerby Nora, PharmD, BCPS Heart Failure Stewardship Pharmacist Phone [phone]

## 2021-09-20 NOTE — Progress Notes (Addendum)
PROGRESS NOTE   [name]  ZOX:[id number]    DOB: [birth year]    DOA: 09/19/2021  PCP: [name], Michael C, MD   I [facility] briefly [facility]  Chief Complaint  Patient presents with   Shortness of Breath    Brief Narrative:  56 year old female, lives at home with hospice, medical history significant for COPD, chronic respiratory failure with hypoxia on home oxygen (reportedly 7 L/min) and nightly BiPAP, chronic diastolic CHF, chronic pain, GERD, HTN, presented to ED with progressively worsening subacute on chronic dyspnea, productive cough, worsening bilateral leg swelling.  In ED, tachypneic with respiratory distress.  She was transitioned from CPAP to BiPAP.  Admitted for acute on chronic respiratory failure with hypoxia and hypercarbia due to possible pneumonia, COPD exacerbation and acute on chronic diastolic CHF.   Assessment & Plan:  Principal Problem:   Acute on chronic respiratory failure with hypoxia and hypercapnia (HCC) Active Problems:   Chronic pain   Acute on chronic heart failure with preserved ejection fraction (HFpEF) (HCC)   Acute exacerbation of chronic obstructive pulmonary disease (COPD) (HCC)   Acute on chronic hypoxic and hypercarbic respiratory failure: Due to possible pneumonia, COPD exacerbation and acute on chronic diastolic CHF Reportedly on home oxygen 7 L/min and nightly BiPAP (?  CPAP) VBG on admission: pH 7.33, PCO2 75.1, PO2 48. Remained on BiPAP overnight of admission into this morning. Treat underlying etiologies as below. RT to follow, try to wean off BiPAP to nasal cannula oxygen.  Titrate oxygen to maintain saturations between 89-92%. Added flutter valve, budesonide and Pulmicort nebs.  Resumed home dose of Singulair.  Possible pneumonia Continue IV ceftriaxone and azithromycin.  COPD exacerbation, oxygen and steroid-dependent. Continue IV steroids, duo nebs, as needed albuterol  nebs. Added flutter valve. Steroids to be tapered down to prior home dose of 20 mg daily.  Acute on chronic diastolic CHF 2D echo 09/06/2020: LVEF 65-70%. Continue IV Lasix 20 mg twice daily. -1200 put out 1200 mL urine overnight.  -700 mL thus far.  Hypomagnesemia: Has been replaced.  Follow magnesium now and replace as needed.  Anemia Baseline hemoglobin not entirely clear.  Hemoglobin stable in the mid 8 g range since admission.  Had hemoglobin of 11-12 in June 2022. No bleeding reported. Follow CBC in AM.  Chronic pain Parenteral meds while on BiPAP and resume oral meds when able to take p.o. Resumed MS Contin, as needed Vicodin, gabapentin.  Holding liquid morphine for now.  Chronic leg edema and wounds Per patient, she and spouse wrapped/dressed leg at home themselves. [facility] RN consulted.  Body mass index is 31.44 kg/m./Obesity.  N  DVT prophylaxis: enoxaparin (LOVENOX) injection 40 mg Start: 09/20/21 1000     Code Status: DNR Family Communication: None at bedside Disposition:  Status is: Inpatient  Remains inpatient appropriate because: Severity of illness and IV meds.        Consultants:   None  Procedures:   BiPAP  Antimicrobials:   IV ceftriaxone and azithromycin 1/11 >   Subjective:  Seen this morning while still in ED.  Was still on BiPAP.  Reports feeling better.  Dyspnea has improved.  Lives at home with spouse.  Mostly independent.  Has a rolling walker which she uses occasionally.  Objective:   Vitals:   09/20/21 0701 09/20/21 0745 09/20/21 0755 09/20/21 0756  BP: 92/69 115/85 115/85 115/85  Pulse: 88 91 98 (!) 101  Resp: 16 20 20  (!)  22  Temp: 97.8 F (36.6 C)   98.1 F (36.7 C)  TempSrc:    Oral  SpO2: 100% 90% 100% 100%  Weight:      Height:        General exam: Young female, looks older than stated age, lying comfortably propped up in bed without distress.  Remains on BiPAP.  Thick neck. Respiratory system: Clear to  auscultation anteriorly.  Diminished breath sounds posteriorly with occasional rhonchi and occasional basal crackles.  No increased work of breathing.  Is on BiPAP Cardiovascular system: S1 & S2 heard, RRR. No murmurs, rubs, gallops or clicks.  Bilateral chronic leg edema.  Unable to appreciate JVD given body habitus. Gastrointestinal system: Abdomen is nondistended, soft and nontender. No organomegaly or masses felt. Normal bowel sounds heard. Central nervous system: Alert and oriented. No focal neurological deficits. Extremities: Symmetric 5 x 5 power.  Bilateral legs with Ace wrapping, chronic edema changes without acute findings Skin: No rashes, lesions or ulcers Psychiatry: Judgement and insight appear normal. Mood & affect appropriate.     Data Reviewed:   I have personally reviewed following labs and imaging studies   CBC: Recent Labs  Lab 09/19/21 1945 09/19/21 1953 09/20/21 0659  WBC 23.9*  --  14.7*  NEUTROABS 22.9*  --   --   HGB 8.5* 10.2* 8.6*  HCT 30.6* 30.0* 32.6*  MCV 86.0  --  86.9  PLT 383  --  367    Basic Metabolic Panel: Recent Labs  Lab 09/19/21 1945 09/19/21 1953 09/20/21 0659  NA 138 135 140  K 4.2 5.7* 4.2  CL 95*  --  94*  CO2 33*  --  32  GLUCOSE 180*  --  142*  BUN 8  --  6  CREATININE 0.66  --  0.55  CALCIUM 7.9*  --  8.1*  MG 1.4*  --   --     Liver Function Tests: Recent Labs  Lab 09/19/21 1945  AST 32  ALT 26  ALKPHOS 114  BILITOT 0.4  PROT 5.7*  ALBUMIN 3.1*    CBG: Recent Labs  Lab 09/19/21 1852  GLUCAP 204*    Microbiology Studies:   Recent Results (from the past 240 hour(s))  Resp Panel by RT-PCR (Flu A&B, Covid) Nasopharyngeal Swab     Status: None   Collection Time: 09/19/21  7:07 PM   Specimen: Nasopharyngeal Swab; Nasopharyngeal(NP) swabs in vial transport medium  Result Value Ref Range Status   SARS Coronavirus 2 by RT PCR NEGATIVE NEGATIVE Final    Comment: (NOTE) SARS-CoV-2 target nucleic acids are NOT  DETECTED.  The SARS-CoV-2 RNA is generally detectable in upper respiratory specimens during the acute phase of infection. The lowest concentration of SARS-CoV-2 viral copies this assay can detect is 138 copies/mL. A negative result does not preclude SARS-Cov-2 infection and should not be used as the sole basis for treatment or other patient management decisions. A negative result may occur with  improper specimen collection/handling, submission of specimen other than nasopharyngeal swab, presence of viral mutation(s) within the areas targeted by this assay, and inadequate number of viral copies(<138 copies/mL). A negative result must be combined with clinical observations, patient history, and epidemiological information. The expected result is Negative.  Fact Sheet for Patients:  BloggerCourse.com  Fact Sheet for Healthcare Providers:  SeriousBroker.it  This test is no t yet approved or cleared by the [place] FDA and  has been authorized for detection and/or diagnosis of SARS-CoV-2 by  FDA under an Emergency Use Authorization (EUA). This EUA will remain  in effect (meaning this test can be used) for the duration of the COVID-19 declaration under Section 564(b)(1) of the Act, 21 U.S.C.section 360bbb-3(b)(1), unless the authorization is terminated  or revoked sooner.       Influenza A by PCR NEGATIVE NEGATIVE Final   Influenza B by PCR NEGATIVE NEGATIVE Final    Comment: (NOTE) The Xpert Xpress SARS-CoV-2/FLU/RSV plus assay is intended as an aid in the diagnosis of influenza from Nasopharyngeal swab specimens and should not be used as a sole basis for treatment. Nasal washings and aspirates are unacceptable for Xpert Xpress SARS-CoV-2/FLU/RSV testing.  Fact Sheet for Patients: BloggerCourse.com  Fact Sheet for Healthcare Providers: SeriousBroker.it  This test is not yet  approved or cleared by the [place] FDA and has been authorized for detection and/or diagnosis of SARS-CoV-2 by FDA under an Emergency Use Authorization (EUA). This EUA will remain in effect (meaning this test can be used) for the duration of the COVID-19 declaration under Section 564(b)(1) of the Act, 21 U.S.C. section 360bbb-3(b)(1), unless the authorization is terminated or revoked.  Performed at [facility], [address] [place], [place] [address]     Radiology Studies:  DG Chest Port 1 View  Result Date: 09/19/2021 CLINICAL DATA:  Dyspnea EXAM: PORTABLE CHEST 1 VIEW COMPARISON:  02/25/2021, CT 11/11/2020 FINDINGS: Emphysema and chronic interstitial lung disease. Increased right greater than left basilar airspace opacity. Cardiomegaly. No pneumothorax. IMPRESSION: 1. Underlying emphysema and chronic lung disease. Increased right greater than left basilar airspace opacity concerning for superimposed pneumonia and possible right effusion 2. Cardiomegaly with mild vascular congestion Electronically Signed   By: Jasmine Pang M.D.   On: 09/19/2021 19:39    Scheduled Meds:    enoxaparin (LOVENOX) injection  40 mg Subcutaneous Q24H   furosemide  20 mg Intravenous BID   ipratropium-albuterol  3 mL Nebulization Q6H   methylPREDNISolone (SOLU-MEDROL) injection  125 mg Intravenous Q12H   Followed by   Melene Muller ON 09/21/2021] predniSONE  40 mg Oral Q breakfast   sodium chloride flush  3 mL Intravenous Q12H    Continuous Infusions:    azithromycin     cefTRIAXone (ROCEPHIN)  IV       LOS: 1 day     Marcellus Scott, MD,  FACP, [facility], [facility], [facility] (Care Management Physician Certified) [facility] [place]  To contact the attending provider between 7A-7P or the covering provider during after hours 7P-7A, please log into the web site [email or web address] and access using universal [place] password for that web site. If you do not have the password,  please call the hospital operator.  09/20/2021, 9:35 AM

## 2021-09-20 NOTE — Progress Notes (Signed)
PT Cancellation Note  Patient Details Name: [name] MRN: [id number] DOB: 1965/12/21   Cancelled Treatment:    Reason Eval/Treat Not Completed: Fatigue/lethargy limiting ability to participate. Pt asleep upon arrival with RN reporting pt has been like this majority of day. Rn requesting PT not wake pt at this time and allow her to rest. Will plan to follow-up tomorrow as able.   [name], PT, DPT Acute Rehabilitation Services  Pager: [phone] Office: [phone]    [name] 09/20/2021, 5:08 PM

## 2021-09-20 NOTE — Progress Notes (Signed)
[facility]) hospitalized hospice patient visit:  Patient is a current [facility] hospice patient with a terminal diagnosis of COPD.  Familiy activated EMS when patient had increasing difficulty breathing. Family did notify hospice. Patient was admitted with acute respiratory failure and hypoxia. Per Dr. Karie Georges with [facility], this is a related admission.  Visited patient at bedside in ED, husband was present. Patient was sleeping and did not appear in distress at time of visit. Provided support to husband and answered questions. Patient had previously been on BiPAP but was off at time of visit.   This is an appropriate hospital admission due to symptom management needs and IV antibiotics.   VS: 98.1, 105/71, 91, 18, 100% O2 9Lnc   Abnormal labs: 09/19/21 19:53 pCO2, Ven: 75.1 (HH) pO2, Ven: 48.0 (H) TCO2: 42 (H) Acid-Base Excess: 12.0 (H) Bicarbonate: 39.9 (H)  09/20/21 06:59 Chloride: 94 (L) Glucose: 142 (H) Calcium: 8.1 (L) WBC: 14.7 (H) RBC: 3.75 (L) Hemoglobin: 8.6 (L) HCT: 32.6 (L) MCH: 22.9 (L) MCHC: 26.4 (L) RDW: 15.9 (H) 09/19/21 19:53 Calcium Ionized: 1.05 (L)  Diagnostics: CXR IMPRESSION: 1. Underlying emphysema and chronic lung disease. Increased right greater than left basilar airspace opacity concerning for superimposed pneumonia and possible right effusion 2. Cardiomegaly with mild vascular congestion   IV/PRN medications: Rocephin 2g IVPB QD, Zithromax 500mg  IVPB QD  Problem list: Acute on chronic hypoxic and hypercarbic respiratory failure: Due to possible pneumonia, COPD exacerbation and acute on chronic diastolic CHF Reportedly on home oxygen 7 L/min and nightly BiPAP (?  CPAP) VBG on admission: pH 7.33, PCO2 75.1, PO2 48. Remained on BiPAP overnight of admission into this morning. Treat underlying etiologies as below. RT to follow, try to wean off BiPAP to nasal cannula oxygen.  Titrate oxygen to maintain saturations between  89-92%. Added flutter valve, budesonide and Pulmicort nebs.  Resumed home dose of Singulair.   Possible pneumonia Continue IV ceftriaxone and azithromycin.   COPD exacerbation, oxygen and steroid-dependent. Continue IV steroids, duo nebs, as needed albuterol nebs. Added flutter valve. Steroids to be tapered down to prior home dose of 20 mg daily.   Acute on chronic diastolic CHF 2D echo 123456: LVEF 65-70%. Continue IV Lasix 20 mg twice daily. -1200 put out 1200 mL urine overnight.  -700 mL thus far.  Hypomagnesemia: Has been replaced.  Follow magnesium now and replace as needed.   Anemia Baseline hemoglobin not entirely clear.  Hemoglobin stable in the mid 8 g range since admission.  Had hemoglobin of 11-12 in June 2022. No bleeding reported. Follow CBC in AM.   Chronic pain Parenteral meds while on BiPAP and resume oral meds when able to take p.o. Resumed MS Contin, as needed Vicodin, gabapentin.  Holding liquid morphine for now.   Chronic leg edema and wounds Per patient, she and spouse wrapped/dressed leg at home themselves. [place] RN consulted.   Body mass index is 31.44 kg/m./Obesity.  Discharge Planning:: return home when hospice when stable for discharge  Family Contact: Spoke with husband at bedside IDG: team updated GOC: Clear. DNR but treat and manage symptoms.   If ambulance transport is needed, Please use [facility].  Please do not hesitate to call with questions.   Thank you,   Farrel Gordon, RN, [facility] Liaison   [phone]

## 2021-09-20 NOTE — H&P (Signed)
History and Physical    [name] ZOX:[id number] DOB: 24-Oct-1965 DOA: 09/19/2021  PCP: [name], Michael C, MD   Patient coming from: Home   Chief Complaint: SOB   HPI: [name] is a pleasant 56 y.o. female with medical history significant for COPD, chronic respiratory failure, chronic diastolic CHF, and chronic pain, now presenting to the emergency department with worsening shortness of breath.  Patient reports recent worsening in her chronic dyspnea with increase in her chronic cough and sputum production, increased bilateral leg swelling, but no fever, chills, or orthopnea.  She has a prescription for Lasix but has not used it recently.  She reports being on hospice at home, had wanted to avoid further hospitalization, but was suffering from acute dyspnea tonight and feels much better currently on BiPAP.  Patient asked for ongoing treatment in the hospital for now, hoping that her condition will improve some with medications and BiPAP and that she will be able to return home.  ED Course: Upon arrival to the ED, patient is found to be tachypneic with labored respirations.  She was transition from CPAP to BiPAP in the ED.  Chest x-ray with cardiomegaly, mild vascular congestion, emphysematous changes, and question of superimposed pneumonia.  CBC with hemoglobin 8.5 and WBC 23,900.  Troponin was 30 and BNP 909.  Chemistry panel notable for bicarbonate 33 and magnesium 1.4.  Patient was treated with IV Solu-Medrol, IV magnesium, Rocephin, and azithromycin in the ED.  Review of Systems:  All other systems reviewed and apart from HPI, are negative.  Past Medical History:  Diagnosis Date   Allergic rhinitis    Asthma    CHF (congestive heart failure) (HCC)    Chronic pain    COPD (chronic obstructive pulmonary disease) (HCC)    on home O2   GERD (gastroesophageal reflux disease)    Headache    History of COVID-19    Hypertension    Neck pain    Open wound of left hip    spouse  changes dressing q day   Shortness of breath    Sleep apnea    NO DEVICE IN USE, USES SUPPLEMENTAL O2 IN PLACE OF DEVICE    Supplemental oxygen dependent    4L CONTINUOUS     Past Surgical History:  Procedure Laterality Date   COLONOSCOPY     HUMERUS IM NAIL Left 07/23/2017   Procedure: INTRAMEDULLARY (IM) NAIL HUMERAL;  Surgeon: Yolonda Kidaogers, Jason Patrick, MD;  Location: [facility] OR;  Service: Orthopedics;  Laterality: Left;   INCISION AND DRAINAGE HIP Left 04/01/2019   Procedure: IRRIGATION AND DEBRIDEMENT HIP;  Surgeon: Samson FredericSwinteck, Brian, MD;  Location: [facility];  Service: Orthopedics;  Laterality: Left;   LUMBAR LAMINECTOMY/DECOMPRESSION MICRODISCECTOMY  04/06/2012   Procedure: LUMBAR LAMINECTOMY/DECOMPRESSION MICRODISCECTOMY;  Surgeon: Kerrin ChampagneJames E Nitka, MD;  Location: [facility] OR;  Service: Orthopedics;  Laterality: N/A;  Left L3-4 and L4-5 lateral recess decompression MIS   NECK SURGERY  Sept 2005   TOTAL ABDOMINAL HYSTERECTOMY  Oct 2003   partial   TOTAL HIP ARTHROPLASTY Left 02/18/2019   Procedure: TOTAL HIP ARTHROPLASTY ANTERIOR APPROACH;  Surgeon: Samson FredericSwinteck, Brian, MD;  Location: [facility];  Service: Orthopedics;  Laterality: Left;   TUBAL LIGATION  1995    Social History:   reports that she has been smoking cigarettes. She has a 15.00 pack-year smoking history. She has never used smokeless tobacco. She reports that she does not currently use alcohol. She reports that she does not use drugs.  Allergies  Allergen Reactions   Amoxicillin-Pot Clavulanate Itching    Ok with benadryl Did it involve swelling of the face/tongue/throat, SOB, or low BP? No Did it involve sudden or severe rash/hives, skin peeling, or any reaction on the inside of your mouth or nose? No Did you need to seek medical attention at a hospital or doctor's office? No When did it last happen?      6 months If all above answers are NO, may proceed with cephalosporin use.    Erythromycin Rash and Other (See Comments)    Ok with  benadryl    Moxifloxacin Itching and Other (See Comments)    Reaction to Avelox - ok with benadryl   Sulfamethoxazole-Trimethoprim Itching and Other (See Comments)    Ok with benadryl    Family History  Problem Relation Age of Onset   Emphysema Father    Rheum arthritis Father    Lung cancer Father    Emphysema Mother      Prior to Admission medications   Medication Sig Start Date End Date Taking? Authorizing Provider  albuterol (PROVENTIL) (2.5 MG/3ML) 0.083% nebulizer solution Inhale 3 mLs into the lungs every 4 (four) hours. 01/30/21   [provider]  ascorbic acid (VITAMIN C) 500 MG tablet Take 1 tablet (500 mg total) by mouth daily. 11/21/20   Kathlen Mody, MD  aspirin EC 81 MG tablet Take 81 mg by mouth daily. Swallow whole.    [provider]  Budeson-Glycopyrrol-Formoterol (BREZTRI AEROSPHERE) 160-9-4.8 MCG/ACT AERO Inhale 2 puffs into the lungs in the morning and at bedtime.    [provider]  cetirizine (ZYRTEC) 10 MG tablet Take 10 mg daily by mouth.    [provider]  COLLAGEN PO Take 1,000 mg by mouth 3 (three) times daily.    [provider]  dextromethorphan-guaiFENesin (MUCINEX DM) 30-600 MG 12hr tablet Take 1 tablet by mouth 2 (two) times daily as needed for cough.    [provider]  DULoxetine (CYMBALTA) 30 MG capsule Take 30 mg by mouth 2 (two) times daily. 07/12/20   [provider]  fluticasone (FLONASE) 50 MCG/ACT nasal spray Place 1 spray into both nostrils daily as needed for rhinitis or allergies.    [provider]  furosemide (LASIX) 20 MG tablet Take 1 tablet (20 mg total) by mouth daily as needed for edema (weight gain of 3lbs in 1 day or 5 lbs in 2 days.). 07/27/20   Rolly Salter, MD  gabapentin (NEURONTIN) 400 MG capsule Take 400 mg by mouth 3 (three) times daily. 08/09/20   [provider]  HYDROcodone-acetaminophen (NORCO) 10-325 MG tablet Take 1 tablet by mouth every 6  (six) hours as needed (breakthrough pain). 04/02/19   Swinteck, Arlys John, MD  montelukast (SINGULAIR) 10 MG tablet Take 10 mg at bedtime by mouth.    [provider]  morphine (MS CONTIN) 30 MG 12 hr tablet Take 30 mg by mouth every 12 (twelve) hours.    [provider]  nicotine (NICODERM CQ - DOSED IN MG/24 HOURS) 21 mg/24hr patch Place 21 mg onto the skin daily. 01/31/21   [provider]  nystatin-triamcinolone ointment (MYCOLOG) Apply 1 application topically 2 (two) times daily as needed (rash). 10/16/20   [provider]  OXYGEN Inhale 4-6 L/min into the lungs See admin instructions. 4 L/min at rest and 6 L/min when exerted    [provider]  pantoprazole (PROTONIX) 40 MG tablet Take 40 mg by mouth  2 (two) times daily. 08/16/20   [provider]  polyethylene glycol powder (GLYCOLAX/MIRALAX) 17 GM/SCOOP powder Take 17 g by mouth daily as needed for mild constipation. 09/04/20   [provider]  SANTYL ointment Apply 1 application topically See admin instructions. Apply daily as directed to wound on left shin 02/21/21   [provider]  senna-docusate (SENOKOT-S) 8.6-50 MG tablet Take 2 tablets by mouth 2 (two) times daily. 11/20/20   Kathlen Mody, MD  Sunscreens (CARMEX DAILY CARE LIP BALM) OINT Apply 1 application topically See admin instructions. Apply to lips daily    [provider]  traZODone (DESYREL) 50 MG tablet Take 1 tablet (50 mg total) by mouth at bedtime as needed for sleep. 11/20/20   Kathlen Mody, MD  Vitamin D, Ergocalciferol, (DRISDOL) 1.25 MG (50000 UNIT) CAPS capsule Take 50,000 Units by mouth every Monday. 06/15/20   [provider]  zinc sulfate 220 (50 Zn) MG capsule Take 1 capsule (220 mg total) by mouth daily. 11/21/20   Kathlen Mody, MD    Physical Exam: Vitals:   09/19/21 1911 09/19/21 1930 09/19/21 2000 09/20/21 0006  SpO2:  100% 100% 100%  Weight: 80.5 kg     Height: 5\' 3"  (1.6 m)        Constitutional: NAD, calm, on BiPAP  Eyes: PERTLA, lids and conjunctivae normal ENMT: Mucous membranes are moist. Posterior pharynx clear of any exudate or lesions.   Neck: supple, no masses  Respiratory: diminished breath sounds bilaterally. Prolonged expiratory phase.  Cardiovascular: S1 & S2 heard, regular rate and rhythm. Pretibial pitting edema.  Abdomen: No distension, no tenderness, soft. Bowel sounds active.  Musculoskeletal: no clubbing / cyanosis. No joint deformity upper and lower extremities.   Skin: Bilateral lower legs wrapped. Warm, dry, well-perfused. Neurologic: CN 2-12 grossly intact. Moving all extremities. Alert and oriented.  Psychiatric: Pleasant. Cooperative.    Labs and Imaging on Admission: I have personally reviewed following labs and imaging studies  CBC: Recent Labs  Lab 09/19/21 1945 09/19/21 1953  WBC 23.9*  --   NEUTROABS 22.9*  --   HGB 8.5* 10.2*  HCT 30.6* 30.0*  MCV 86.0  --   PLT 383  --    Basic Metabolic Panel: Recent Labs  Lab 09/19/21 1945 09/19/21 1953  NA 138 135  K 4.2 5.7*  CL 95*  --   CO2 33*  --   GLUCOSE 180*  --   BUN 8  --   CREATININE 0.66  --   CALCIUM 7.9*  --   MG 1.4*  --    GFR: Estimated Creatinine Clearance: 78.8 mL/min (by C-G formula based on SCr of 0.66 mg/dL). Liver Function Tests: Recent Labs  Lab 09/19/21 1945  AST 32  ALT 26  ALKPHOS 114  BILITOT 0.4  PROT 5.7*  ALBUMIN 3.1*   No results for input(s): LIPASE, AMYLASE in the last 168 hours. No results for input(s): AMMONIA in the last 168 hours. Coagulation Profile: No results for input(s): INR, PROTIME in the last 168 hours. Cardiac Enzymes: No results for input(s): CKTOTAL, CKMB, CKMBINDEX, TROPONINI in the last 168 hours. BNP (last 3 results) No results for input(s): PROBNP in the last 8760 hours. HbA1C: No results for input(s): HGBA1C in the last 72 hours. CBG: Recent Labs  Lab 09/19/21 1852  GLUCAP 204*   Lipid  Profile: No results for input(s): CHOL, HDL, LDLCALC, TRIG, CHOLHDL, LDLDIRECT in the last 72 hours. Thyroid Function Tests: No results for  input(s): TSH, T4TOTAL, FREET4, T3FREE, THYROIDAB in the last 72 hours. Anemia Panel: No results for input(s): VITAMINB12, FOLATE, FERRITIN, TIBC, IRON, RETICCTPCT in the last 72 hours. Urine analysis:    Component Value Date/Time   COLORURINE STRAW (A) 02/15/2019 1309   APPEARANCEUR CLEAR 02/15/2019 1309   LABSPEC 1.002 (L) 02/15/2019 1309   PHURINE 7.0 02/15/2019 1309   GLUCOSEU NEGATIVE 02/15/2019 1309   HGBUR NEGATIVE 02/15/2019 1309   BILIRUBINUR NEGATIVE 02/15/2019 1309   KETONESUR NEGATIVE 02/15/2019 1309   PROTEINUR NEGATIVE 02/15/2019 1309   UROBILINOGEN 0.2 03/31/2012 1338   NITRITE NEGATIVE 02/15/2019 1309   LEUKOCYTESUR NEGATIVE 02/15/2019 1309   Sepsis Labs: @LABRCNTIP (procalcitonin:4,lacticidven:4) )No results found for this or any previous visit (from the past 240 hour(s)).   Radiological Exams on Admission: DG Chest Port 1 View  Result Date: 09/19/2021 CLINICAL DATA:  Dyspnea EXAM: PORTABLE CHEST 1 VIEW COMPARISON:  02/25/2021, CT 11/11/2020 FINDINGS: Emphysema and chronic interstitial lung disease. Increased right greater than left basilar airspace opacity. Cardiomegaly. No pneumothorax. IMPRESSION: 1. Underlying emphysema and chronic lung disease. Increased right greater than left basilar airspace opacity concerning for superimposed pneumonia and possible right effusion 2. Cardiomegaly with mild vascular congestion Electronically Signed   By: Jasmine PangKim  Fujinaga M.D.   On: 09/19/2021 19:39    EKG: Independently reviewed. Sinus tachycardia, rate 107, LVH.   Assessment/Plan   1. Acute on chronic hypoxic and hypercarbic respiratory failure; COPD exacerbation; acute on chronic diastolic CHF  - Pt with COPD, chronic 4 Lpm O2 requirement, and normal EF one yr ago p/w increased SOB and leg swelling and found to have acute on chronic  hypoxia and hypercarbia requiring BiPAP in ED  - Likely multifactorial with evidence for acute COPD, CHF, and possible pneumonia  - She was given IV steroids and antibiotics in ED and reports improvement with BiPAP  - Continue BiPAP, continue IV steroids and antibiotics, diurese with IV Lasix, schedule SAMA/SABA in addition to as-needed SABA    2. Chronic pain  - Use parenteral agents as needed for now while on BiPAP    3. Anemia  - Hgb 8.5 on admission   - No overt bleeding, monitor   4. Chronic leg wounds  - Continue wound care    DVT prophylaxis: Lovenox  Code Status: DNR/DNI, confirmed with patient who indicates that she would like to continue BiPAP for now Level of Care: Level of care: Progressive Family Communication: none present  Disposition Plan:  Patient is from: Home  Anticipated d/c is to: TBD Anticipated d/c date is: 09/22/21  Patient currently: Pending improvement in respiratory status  Consults called: none  Admission status: Inpatient     Briscoe Deutscherimothy S Kess Mcilwain, MD [facility]  09/20/2021, 12:08 AM  `

## 2021-09-21 LAB — CBC
HCT: 29.8 % — ABNORMAL LOW (ref 36.0–46.0)
Hemoglobin: 8.5 g/dL — ABNORMAL LOW (ref 12.0–15.0)
MCH: 23.9 pg — ABNORMAL LOW (ref 26.0–34.0)
MCHC: 28.5 g/dL — ABNORMAL LOW (ref 30.0–36.0)
MCV: 83.9 fL (ref 80.0–100.0)
Platelets: 365 10*3/uL (ref 150–400)
RBC: 3.55 MIL/uL — ABNORMAL LOW (ref 3.87–5.11)
RDW: 15.6 % — ABNORMAL HIGH (ref 11.5–15.5)
WBC: 18.1 10*3/uL — ABNORMAL HIGH (ref 4.0–10.5)
nRBC: 0 % (ref 0.0–0.2)

## 2021-09-21 LAB — BASIC METABOLIC PANEL
Anion gap: 6 (ref 5–15)
BUN: 14 mg/dL (ref 6–20)
CO2: 41 mmol/L — ABNORMAL HIGH (ref 22–32)
Calcium: 7.9 mg/dL — ABNORMAL LOW (ref 8.9–10.3)
Chloride: 93 mmol/L — ABNORMAL LOW (ref 98–111)
Creatinine, Ser: 0.66 mg/dL (ref 0.44–1.00)
GFR, Estimated: >60 mL/min (ref >60)
Glucose, Bld: 141 mg/dL — ABNORMAL HIGH (ref 70–99)
Potassium: 3.5 mmol/L (ref 3.5–5.1)
Sodium: 140 mmol/L (ref 135–145)

## 2021-09-21 LAB — MAGNESIUM: Magnesium: 2.9 mg/dL — ABNORMAL HIGH (ref 1.7–2.4)

## 2021-09-21 LAB — HIV ANTIBODY (ROUTINE TESTING W REFLEX): HIV Screen 4th Generation wRfx: NONREACTIVE

## 2021-09-21 LAB — LEGIONELLA PNEUMOPHILA SEROGP 1 UR AG: L. pneumophila Serogp 1 Ur Ag: NEGATIVE

## 2021-09-21 MED ORDER — METHYLPREDNISOLONE SODIUM SUCC 40 MG IJ SOLR
40.0000 mg | Freq: Two times a day (BID) | INTRAMUSCULAR | Status: DC
  Administered 2021-09-21 – 2021-09-22 (×2): 40 mg via INTRAVENOUS
  Filled 2021-09-21 (×2): qty 1

## 2021-09-21 MED ORDER — POTASSIUM CHLORIDE CRYS ER 20 MEQ PO TBCR
40.0000 meq | EXTENDED_RELEASE_TABLET | Freq: Once | ORAL | Status: AC
Start: 2021-09-21 — End: 2021-09-21
  Administered 2021-09-21: 40 meq via ORAL
  Filled 2021-09-21: qty 2

## 2021-09-21 MED ORDER — ORAL CARE MOUTH RINSE
15.0000 mL | Freq: Two times a day (BID) | OROMUCOSAL | Status: DC
  Administered 2021-09-21 – 2021-09-22 (×2): 15 mL via OROMUCOSAL

## 2021-09-21 MED ORDER — IPRATROPIUM-ALBUTEROL 0.5-2.5 (3) MG/3ML IN SOLN
3.0000 mL | Freq: Three times a day (TID) | RESPIRATORY_TRACT | Status: DC
  Administered 2021-09-21 – 2021-09-22 (×4): 3 mL via RESPIRATORY_TRACT
  Filled 2021-09-21 (×4): qty 3

## 2021-09-21 NOTE — Progress Notes (Addendum)
[facility] - [facility]) hospitalized hospice patient visit:   Patient is a current [facility] hospice patient with a terminal diagnosis of COPD.  Familiy activated EMS when patient had increasing difficulty breathing. Family did notify hospice. Patient was admitted with acute respiratory failure and hypoxia. Per Dr. Anne Fu with [facility], this is a related admission.  Visited at in room, husband present. Patient sitting up in recliner to eat lunch.  Alert and oriented. Reports feeling much better. She is 6.5L [place]. Denies pain or other discomfort. Is hopeful to go home over the weekend.   This is an appropriate hospital admission due to symptom management needs and IV antibiotics.   VS: 98.5, 113/82, 93, 19, 99% on 6.5Lnc I/O: 760/800  Abnormal Labs: 09/21/21 02:36 Chloride: 93 (L) CO2: 41 (H) Glucose: 141 (H) Calcium: 7.9 (L) Magnesium: 2.9 (H) WBC: 18.1 (H) RBC: 3.55 (L) Hemoglobin: 8.5 (L) HCT: 29.8 (L) MCH: 23.9 (L) MCHC: 28.5 (L) RDW: 15.6 (H)  IV/PRN medications: Rocephin 2g IVPB QD, Zithromax 500mg  IVPB QD, Norco 10-325 1tab PRN @ 0029, 1135,  Dilaudid 0.5mg  IV PRN @ 1359  Problem list: Problem list: Acute on chronic hypoxic and hypercarbic respiratory failure: Due to possible pneumonia, COPD exacerbation and acute on chronic diastolic CHF Reportedly on home oxygen 7 L/min and nightly BiPAP (?  CPAP) VBG on admission: pH 7.33, PCO2 75.1, PO2 48. Remained on BiPAP overnight of admission into this morning. Treat underlying etiologies as below. RT to follow, try to wean off BiPAP to nasal cannula oxygen.  Titrate oxygen to maintain saturations between 89-92%. Added flutter valve, budesonide and Pulmicort nebs.  Resumed home dose of Singulair.   Possible pneumonia Continue IV ceftriaxone and azithromycin.   COPD exacerbation, oxygen and steroid-dependent. Continue IV steroids, duo nebs, as needed albuterol nebs. Added flutter valve. Steroids to be tapered down to  prior home dose of 20 mg daily.   Acute on chronic diastolic CHF 2D echo 09/06/2020: LVEF 65-70%. Continue IV Lasix 20 mg twice daily. -1200 put out 1200 mL urine overnight.  -700 mL thus far.  Hypomagnesemia: Has been replaced.  Follow magnesium now and replace as needed.   Anemia Baseline hemoglobin not entirely clear.  Hemoglobin stable in the mid 8 g range since admission.  Had hemoglobin of 11-12 in June 2022. No bleeding reported. Follow CBC in AM.   Chronic pain Parenteral meds while on BiPAP and resume oral meds when able to take p.o. Resumed MS Contin, as needed Vicodin, gabapentin.  Holding liquid morphine for now.   Chronic leg edema and wounds Per patient, she and spouse wrapped/dressed leg at home themselves. WOC RN consulted.   Body mass index is 31.44 kg/m./Obesity.   Discharge Planning:: return home when hospice when stable for discharge  Family Contact: Spoke with husband at bedside IDG: team updated GOC: Clear. DNR but treat and manage symptoms.    If ambulance transport is needed, Please use [facility].   Please do not hesitate to call with questions.   Thank you,   [phone], RN, [facility]      [facility] Liaison   [phone]

## 2021-09-21 NOTE — TOC Progression Note (Addendum)
Transition of Care [facility]) - Progression Note    Patient Details  Name: [name] MRN: [id number] Date of Birth: 12/29/1965  [facility]) CM/SW Contact  Leone Haven, RN Phone Number: 09/21/2021, 3:55 PM  Clinical Narrative:    Patient is from home with spouse, she has home hospice with [facility]. She has a hospital bed.  Her spouse does her dressing changes at home, she is on 7 liters of oxygen at home, which it can go up to 10 liters. She will need ambulance transport home at discharge, address confirmed. She conts on iv abx, iv solumedrol. TOC will continue to follow for dc needs.    Expected Discharge Plan: Home w Hospice Care Barriers to Discharge: Continued Medical Work up  Expected Discharge Plan and Services Expected Discharge Plan: Home w Hospice Care   Discharge Planning Services: CM Consult Post Acute Care Choice: Resumption of Svcs/PTA Provider Living arrangements for the past 2 months: Single Family Home                   DME Agency: NA       HH Arranged: RN HH Agency:  ([facility]) Date HH Agency Contacted: 09/21/21 Time HH Agency Contacted: 1554 Representative spoke with at [facility] Agency: Nita Sells   Social Determinants of Health (SDOH) Interventions    Readmission Risk Interventions Readmission Risk Prevention Plan 02/26/2021  Transportation Screening Complete  PCP or Specialist Appt within 3-5 Days Complete  HRI or Home Care Consult Complete  Social Work Consult for Recovery Care Planning/Counseling Complete  Palliative Care Screening Complete  Medication Review [employer]) Referral to Pharmacy  Some recent data might be hidden

## 2021-09-21 NOTE — Progress Notes (Signed)
RN and NT performed daily wound care dressing changed. Patient tolerated well. Requested for prevalon boots to remain off for the next hour.

## 2021-09-21 NOTE — Progress Notes (Signed)
PROGRESS NOTE   Nichole RanchDrinda S Morrison  ZOX:[id number]    DOB: 10-Dec-1965    DOA: 09/19/2021  PCP: [name], Michael C, MD   I have briefly [facility] patients [facility].  Chief Complaint  Patient presents with   Shortness of Breath    Brief Narrative:  56 year old female, lives at home with hospice, medical history significant for COPD, chronic respiratory failure with hypoxia on home oxygen (reportedly 7 L/min) and nightly BiPAP, chronic diastolic CHF, chronic pain, GERD, HTN, presented to ED with progressively worsening subacute on chronic dyspnea, productive cough, worsening bilateral leg swelling.  In ED, tachypneic with respiratory distress.  She was transitioned from CPAP to BiPAP.  Admitted for acute on chronic respiratory failure with hypoxia and hypercarbia due to possible pneumonia, COPD exacerbation and acute on chronic diastolic CHF.  Improving.   Assessment & Plan:  Principal Problem:   Acute on chronic respiratory failure with hypoxia and hypercapnia (HCC) Active Problems:   Chronic pain   Acute on chronic heart failure with preserved ejection fraction (HFpEF) (HCC)   Acute exacerbation of chronic obstructive pulmonary disease (COPD) (HCC)   Acute on chronic hypoxic and hypercarbic respiratory failure: Due to possible pneumonia, COPD exacerbation and acute on chronic diastolic CHF Reportedly on home oxygen 7 L/min and nightly BiPAP (?  CPAP) VBG on admission: pH 7.33, PCO2 75.1, PO2 48. Remained on BiPAP overnight of admission. Treat underlying etiologies as below. Weaned off BiPAP 1/12.  Currently on prior home level of oxygen 7 L/min.  Titrate oxygen to maintain saturations between 89-92%. Added flutter valve, budesonide and Pulmicort nebs.  Resumed home dose of Singulair. Improved.  Pneumococcal pneumonia Continue IV ceftriaxone.  Transition to oral antibiotics at discharge tomorrow.  Urine pneumococcal antigen positive, Legionella antigen  negative.  Discontinued azithromycin.  COPD exacerbation, oxygen and steroid-dependent. Continue IV steroids, duo nebs, as needed albuterol nebs. Added flutter valve. Steroids to be tapered down to prior home dose of 20 mg daily. Improving.  Reduced IV steroids with plan to transition to oral prednisone tomorrow.  Acute on chronic diastolic CHF 2D echo 09/06/2020: LVEF 65-70%. Continue IV Lasix 20 mg twice daily. - intake output appears inaccurate.  -1.2 L thus far.  Volume status continues to improve.  Continue IV Lasix for today and then transition to oral Lasix tomorrow.  Hypomagnesemia: Has been aggressively replaced.  Anemia Baseline hemoglobin not entirely clear.  Hemoglobin stable in the mid 8 g range since admission.  Had hemoglobin of 11-12 in June 2022. No bleeding reported.  Chronic pain Resumed MS Contin, as needed Vicodin, gabapentin.  Holding liquid morphine for now.  Controlled.  Chronic leg edema and wounds Per patient, she and spouse wrapped/dressed leg at home themselves. WOC RN input appreciated.  Body mass index is 28.86 kg/m./Obesity.   DVT prophylaxis: enoxaparin (LOVENOX) injection 40 mg Start: 09/20/21 1000     Code Status: DNR Family Communication: None at bedside Disposition:  Status is: Inpatient  Remains inpatient appropriate because: Severity of illness and IV meds.  Possible DC home with hospice 1/14.        Consultants:   None  Procedures:   BiPAP  Antimicrobials:   IV ceftriaxone and azithromycin 1/11 >   Subjective:  Overall improved.  Feels that her breathing is 90% better but has not been out of bed.  Discussed with RN regarding up in chair and mobilize.  No chest pain.  Leg swelling significantly improved.  Objective:  Vitals:   09/21/21 0958 09/21/21 1129 09/21/21 1358 09/21/21 1435  BP:  113/82 123/80   Pulse: 99 96    Resp:  19    Temp:  98.5 F (36.9 C)    TempSrc:  Oral    SpO2:  99%  100%  Weight:       Height:        General exam: Young female, looks older than stated age, lying comfortably propped up in bed without distress.  Thick neck.  Appears much improved compared to yesterday. Respiratory system: Improved breath sounds bilaterally.  Occasional rhonchi posteriorly but otherwise clear to auscultation.  No increased work of breathing.  Able to speak in full sentences without difficulty. Cardiovascular system: S1 & S2 heard, RRR. No murmurs, rubs, gallops or clicks.  Bilateral chronic leg edema has almost resolved.  Unable to appreciate JVD given body habitus.  Telemetry personally reviewed: Sinus rhythm. Gastrointestinal system: Abdomen is nondistended, soft and nontender. No organomegaly or masses felt. Normal bowel sounds heard. Central nervous system: Alert and oriented. No focal neurological deficits. Extremities: Symmetric 5 x 5 power.  Bilateral legs with Ace wrapping, chronic edema changes without acute findings Skin: No rashes, lesions or ulcers Psychiatry: Judgement and insight appear normal. Mood & affect appropriate.     Data Reviewed:   I have personally reviewed following labs and imaging studies   CBC: Recent Labs  Lab 09/19/21 1945 09/19/21 1953 09/20/21 0659 09/21/21 0236  WBC 23.9*  --  14.7* 18.1*  NEUTROABS 22.9*  --   --   --   HGB 8.5* 10.2* 8.6* 8.5*  HCT 30.6* 30.0* 32.6* 29.8*  MCV 86.0  --  86.9 83.9  PLT 383  --  367 365    Basic Metabolic Panel: Recent Labs  Lab 09/19/21 1945 09/19/21 1953 09/20/21 0659 09/20/21 1631 09/21/21 0236  NA 138 135 140  --  140  K 4.2 5.7* 4.2  --  3.5  CL 95*  --  94*  --  93*  CO2 33*  --  32  --  41*  GLUCOSE 180*  --  142*  --  141*  BUN 8  --  6  --  14  CREATININE 0.66  --  0.55  --  0.66  CALCIUM 7.9*  --  8.1*  --  7.9*  MG 1.4*  --   --  1.5* 2.9*    Liver Function Tests: Recent Labs  Lab 09/19/21 1945  AST 32  ALT 26  ALKPHOS 114  BILITOT 0.4  PROT 5.7*  ALBUMIN 3.1*     CBG: Recent Labs  Lab 09/19/21 1852  GLUCAP 204*    Microbiology Studies:   Recent Results (from the past 240 hour(s))  Resp Panel by RT-PCR (Flu A&B, Covid) Nasopharyngeal Swab     Status: None   Collection Time: 09/19/21  7:07 PM   Specimen: Nasopharyngeal Swab; Nasopharyngeal(NP) swabs in vial transport medium  Result Value Ref Range Status   SARS Coronavirus 2 by RT PCR NEGATIVE NEGATIVE Final    Comment: (NOTE) SARS-CoV-2 target nucleic acids are NOT DETECTED.  The SARS-CoV-2 RNA is generally detectable in upper respiratory specimens during the acute phase of infection. The lowest concentration of SARS-CoV-2 viral copies this assay can detect is 138 copies/mL. A negative result does not preclude SARS-Cov-2 infection and should not be used as the sole basis for treatment or other patient management decisions. A negative result may occur with  improper specimen  collection/handling, submission of specimen other than nasopharyngeal swab, presence of viral mutation(s) within the areas targeted by this assay, and inadequate number of viral copies(<138 copies/mL). A negative result must be combined with clinical observations, patient history, and epidemiological information. The expected result is Negative.  Fact Sheet for Patients:  BloggerCourse.com  Fact Sheet for Healthcare Providers:  SeriousBroker.it  This test is no t yet approved or cleared by the [place] FDA and  has been authorized for detection and/or diagnosis of SARS-CoV-2 by FDA under an Emergency Use Authorization (EUA). This EUA will remain  in effect (meaning this test can be used) for the duration of the COVID-19 declaration under Section 564(b)(1) of the Act, 21 U.S.C.section 360bbb-3(b)(1), unless the authorization is terminated  or revoked sooner.       Influenza A by PCR NEGATIVE NEGATIVE Final   Influenza B by PCR NEGATIVE NEGATIVE Final     Comment: (NOTE) The Xpert Xpress SARS-CoV-2/FLU/RSV plus assay is intended as an aid in the diagnosis of influenza from Nasopharyngeal swab specimens and should not be used as a sole basis for treatment. Nasal washings and aspirates are unacceptable for Xpert Xpress SARS-CoV-2/FLU/RSV testing.  Fact Sheet for Patients: BloggerCourse.com  Fact Sheet for Healthcare Providers: SeriousBroker.it  This test is not yet approved or cleared by the [place] FDA and has been authorized for detection and/or diagnosis of SARS-CoV-2 by FDA under an Emergency Use Authorization (EUA). This EUA will remain in effect (meaning this test can be used) for the duration of the COVID-19 declaration under Section 564(b)(1) of the Act, 21 U.S.C. section 360bbb-3(b)(1), unless the authorization is terminated or revoked.  Performed at [facility], [address] [place], [place] [address]     Radiology Studies:  DG Chest Port 1 View  Result Date: 09/19/2021 CLINICAL DATA:  Dyspnea EXAM: PORTABLE CHEST 1 VIEW COMPARISON:  02/25/2021, CT 11/11/2020 FINDINGS: Emphysema and chronic interstitial lung disease. Increased right greater than left basilar airspace opacity. Cardiomegaly. No pneumothorax. IMPRESSION: 1. Underlying emphysema and chronic lung disease. Increased right greater than left basilar airspace opacity concerning for superimposed pneumonia and possible right effusion 2. Cardiomegaly with mild vascular congestion Electronically Signed   By: Jasmine Pang M.D.   On: 09/19/2021 19:39    Scheduled Meds:    arformoterol  15 mcg Nebulization BID   budesonide (PULMICORT) nebulizer solution  0.25 mg Nebulization BID   DULoxetine  30 mg Oral BID   enoxaparin (LOVENOX) injection  40 mg Subcutaneous Q24H   fluconazole  100 mg Oral Daily   furosemide  20 mg Intravenous BID   gabapentin  400 mg Oral TID   ipratropium-albuterol  3 mL  Nebulization TID   loratadine  10 mg Oral Daily   mouth rinse  15 mL Mouth Rinse BID   methylPREDNISolone (SOLU-MEDROL) injection  40 mg Intravenous Q12H   montelukast  10 mg Oral QHS   morphine  30 mg Oral Q12H   nicotine  21 mg Transdermal Daily   pantoprazole  40 mg Oral BID   sodium chloride flush  3 mL Intravenous Q12H    Continuous Infusions:    azithromycin Stopped (09/20/21 2330)   cefTRIAXone (ROCEPHIN)  IV Stopped (09/20/21 2038)     LOS: 2 days     Marcellus Scott, MD,  FACP, [facility], [facility], [facility] (Care Management Physician Certified) [facility] [place]  To contact the attending provider between 7A-7P or the covering provider during after  hours 7P-7A, please log into the web site [email or web address] and access using universal [place] password for that web site. If you do not have the password, please call the hospital operator.  09/21/2021, 4:11 PM

## 2021-09-21 NOTE — Evaluation (Signed)
Physical Therapy Evaluation Only Patient Details Name: [name] MRN: [id number] DOB: [birth year] Today's Date: 09/21/2021  History of Present Illness  Pt is a 56 y.o. female who presented 09/19/21 with worsening SOB and leg edema. Chest x-ray revealed increased right greater than left basilar airspace opacity concerning for superimposed pneumonia and possible right effusion. PMH: COPD, chronic respiratory failure, chronic diastolic CHF, GERD, HTN, and chronic pain   Clinical Impression  Pt presents with condition above. She presents from home on hospice. She lives with her husband (who can assist 24/7) and stays on the main floor of the house with 1 STE. At baseline, she is on 7L of continuous O2 at home. Pt is currently functioning at her baseline level of function, performing bed mobility mod I and stand step transfers transferring hands to each surface with no physical assistance. She is at risk for falls, but denies any falls in past 6 months. At baseline, she does not walk further than a couple steps to transfer due to endurance deficits. However, she does wish to improve, thus recommending follow-up with HHPT if able. At baseline, her husband pushes her around in the house using the transport chair, but if she had a manual w/c she could be more independent with mobility. All education completed and questions answered. Pt verbalized understanding to remain mobile during hospitalization with nursing staff to reduce risk of deconditioning. As pt is at her baseline currently, no further acute PT needs identified.     Recommendations for follow up therapy are one component of a multi-disciplinary discharge planning process, led by the attending physician.  Recommendations may be updated based on patient status, additional functional criteria and insurance authorization.  Follow Up Recommendations Home health PT (plans to go home with hospice)    Assistance Recommended at Discharge Intermittent  Supervision/Assistance  Patient can return home with the following  A little help with walking and/or transfers;A little help with bathing/dressing/bathroom;Assist for transportation;Help with stairs or ramp for entrance    Equipment Recommendations Wheelchair (measurements PT);Wheelchair cushion (measurements PT)  Recommendations for Other Services  OT consult    Functional Status Assessment Patient has not had a recent decline in their functional status     Precautions / Restrictions Precautions Precautions: Fall Precaution Comments: monitor SpO2 Restrictions Weight Bearing Restrictions: No      Mobility  Bed Mobility Overal bed mobility: Modified Independent             General bed mobility comments: Pt able to perform bed mobility using bed rail and HOB elevated at mod I level.    Transfers Overall transfer level: Needs assistance Equipment used: None Transfers: Sit to/from Stand;Bed to chair/wheelchair/BSC Sit to Stand: Min guard   Step pivot transfers: Min guard       General transfer comment: Pt requesting chair to be directly anterior to her to simulate home set-up. Pt transfers hands from bed to recliner for support with stand and step transfer, min guard for safety, reports this is baseline.    Ambulation/Gait Ambulation/Gait assistance: Min guard Gait Distance (Feet): 2 Feet Assistive device: None Gait Pattern/deviations: Step-through pattern;Decreased step length - right;Decreased step length - left;Decreased stride length;Trunk flexed Gait velocity: reduced Gait velocity interpretation: <1.31 ft/sec, indicative of household ambulator   General Gait Details: Pt holding onto bed or recliner for stand step transfer, keeping trunk flexed, no LOB, min guard for safety. Pt declined further mobility due to SOB, SpO2 stable on 6L though.  Stairs  Wheelchair Mobility    Modified Rankin (Stroke Patients Only)       Balance Overall  balance assessment: Needs assistance Sitting-balance support: No upper extremity supported;Feet supported Sitting balance-Leahy Scale: Fair Sitting balance - Comments: Supervision for safety with static sitting EOB.   Standing balance support: Single extremity supported;Bilateral upper extremity supported;During functional activity Standing balance-Leahy Scale: Poor Standing balance comment: Reliant on at least 1 UE support for mobility                             Pertinent Vitals/Pain Pain Assessment: 0-10 Pain Score: 9  Pain Location: back, neck Pain Descriptors / Indicators: Discomfort;Guarding;Grimacing Pain Intervention(s): Limited activity within patient's tolerance;Monitored during session;Repositioned    Home Living Family/patient expects to be discharged to:: Private residence Living Arrangements: Spouse/significant other Available Help at Discharge: Family;Available 24 hours/day Type of Home: House Home Access: Stairs to enter Entrance Stairs-Rails: Can reach both;Left;Right Entrance Stairs-Number of Steps: 1 (short) Alternate Level Stairs-Number of Steps: flight Home Layout: Two level;Able to live on main level with bedroom/bathroom (does not go upstairs) Home Equipment: Conservation officer, nature (2 wheels);Rollator (4 wheels);Cane - single point;BSC/3in1;Shower seat;Transport chair;Hospital bed Additional Comments: On 7L of continuous O2 at home. Home with hospice who comes out 1x/week for med management and wound care and any services she needs    Prior Function Prior Level of Function : Needs assist       Physical Assist : Mobility (physical);ADLs (physical) Mobility (physical): Gait;Stairs;Transfers ADLs (physical): Bathing;Dressing;IADLs;Toileting Mobility Comments: Pt able to perform bed mobility mod I. Pt tends to perform stand step transfer,s transferring hands to each surface, with mod I - SUP. Denies any falls in past 6 months. Does not walk further than a  couple steps to transfer. Husband likes to be there to assist. Husband pushes pt around in home using the transport chair. ADLs Comments: Wears slippers. Husband assists with dressing and occasionally asissts with sponge baths. Husband performs pericare with BMs. Most of time she sits for sponge baths but sometimes has to stand a little. Manages her own meds and finances majority of time as long as she is not "out of it".     Hand Dominance   Dominant Hand: Right    Extremity/Trunk Assessment   Upper Extremity Assessment Upper Extremity Assessment: Defer to OT evaluation    Lower Extremity Assessment Lower Extremity Assessment: Generalized weakness;RLE deficits/detail;LLE deficits/detail RLE Deficits / Details: MMT scores of 3+ hip flexion, 4 knee extension; numbness noted in foot RLE Sensation: decreased light touch LLE Deficits / Details: MMT scores of 3+ hip flexion, 4 knee extension; denies numbness/tingling LLE Sensation: WNL    Cervical / Trunk Assessment Cervical / Trunk Assessment: Normal  Communication   Communication: No difficulties  Cognition Arousal/Alertness: Awake/alert Behavior During Therapy: WFL for tasks assessed/performed Overall Cognitive Status: Within Functional Limits for tasks assessed                                          General Comments General comments (skin integrity, edema, etc.): VSS on 6 L O2; encouraged pt to mobilize with staff while here to reduce risk of deconditioning and to mobilize often to improve endurance and to reduce sodium and procesed food intake with written instructions to pass along to husband, verbalized understanding    Exercises     Assessment/Plan  PT Assessment All further PT needs can be met in the next venue of care  PT Problem List Decreased strength;Decreased activity tolerance;Decreased balance;Decreased mobility;Cardiopulmonary status limiting activity;Impaired sensation       PT Treatment  Interventions      PT Goals (Current goals can be found in the Care Plan section)  Acute Rehab PT Goals Patient Stated Goal: to walk better PT Goal Formulation: All assessment and education complete, DC therapy Time For Goal Achievement: 10/05/21 Potential to Achieve Goals: Fair    Frequency       Co-evaluation               AM-PAC PT "6 Clicks" Mobility  Outcome Measure Help needed turning from your back to your side while in a flat bed without using bedrails?: None Help needed moving from lying on your back to sitting on the side of a flat bed without using bedrails?: None Help needed moving to and from a bed to a chair (including a wheelchair)?: A Little Help needed standing up from a chair using your arms (e.g., wheelchair or bedside chair)?: A Little Help needed to walk in hospital room?: Total Help needed climbing 3-5 steps with a railing? : Total 6 Click Score: 16    End of Session Equipment Utilized During Treatment: Oxygen Activity Tolerance: Patient tolerated treatment well Patient left: in chair;with call bell/phone within reach;with chair alarm set Nurse Communication: Mobility status PT Visit Diagnosis: Unsteadiness on feet (R26.81);Other abnormalities of gait and mobility (R26.89);Muscle weakness (generalized) (M62.81);Difficulty in walking, not elsewhere classified (R26.2)    Time: 9024-0973 PT Time Calculation (min) (ACUTE ONLY): 34 min   Charges:   PT Evaluation $PT Eval Moderate Complexity: 1 Mod PT Treatments $Therapeutic Activity: 8-22 mins        Moishe Spice, PT, DPT Acute Rehabilitation Services  Pager: [phone] Office: [phone]   Orvan Falconer 09/21/2021, 11:36 AM

## 2021-09-21 NOTE — TOC Progression Note (Signed)
Transition of Care [facility]) - Progression Note    Patient Details  Name: [name] MRN: [id number] Date of Birth: 1966-03-19  [facility]) CM/SW Contact  Leone Haven, RN Phone Number: 09/21/2021, 10:47 AM  Clinical Narrative:    Patient is active with [facility] for home Hospice.  TOC  will continue to follow for dc needs.        Expected Discharge Plan and Services                                                 Social Determinants of Health (SDOH) Interventions    Readmission Risk Interventions Readmission Risk Prevention Plan 02/26/2021  Transportation Screening Complete  PCP or Specialist Appt within 3-5 Days Complete  HRI or Home Care Consult Complete  Social Work Consult for Recovery Care Planning/Counseling Complete  Palliative Care Screening Complete  Medication Review Oceanographer) Referral to Pharmacy  Some recent data might be hidden

## 2021-09-21 NOTE — Plan of Care (Signed)
?  Problem: Education: ?Goal: Ability to demonstrate management of disease process will improve ?Outcome: Progressing ?  ?Problem: Activity: ?Goal: Capacity to carry out activities will improve ?Outcome: Progressing ?  ?Problem: Cardiac: ?Goal: Ability to achieve and maintain adequate cardiopulmonary perfusion will improve ?Outcome: Progressing ?  ?

## 2021-09-22 DIAGNOSIS — J13 Pneumonia due to Streptococcus pneumoniae: Secondary | ICD-10-CM

## 2021-09-22 LAB — BASIC METABOLIC PANEL
Anion gap: 9 (ref 5–15)
Anion gap: 8 (ref 5–15)
BUN: 18 mg/dL (ref 6–20)
BUN: 16 mg/dL (ref 6–20)
CO2: 43 mmol/L — ABNORMAL HIGH (ref 22–32)
CO2: 42 mmol/L — ABNORMAL HIGH (ref 22–32)
Calcium: 8.6 mg/dL — ABNORMAL LOW (ref 8.9–10.3)
Calcium: 8.6 mg/dL — ABNORMAL LOW (ref 8.9–10.3)
Chloride: 89 mmol/L — ABNORMAL LOW (ref 98–111)
Chloride: 88 mmol/L — ABNORMAL LOW (ref 98–111)
Creatinine, Ser: 0.78 mg/dL (ref 0.44–1.00)
Creatinine, Ser: 0.76 mg/dL (ref 0.44–1.00)
GFR, Estimated: >60 mL/min (ref >60)
GFR, Estimated: >60 mL/min (ref >60)
Glucose, Bld: 111 mg/dL — ABNORMAL HIGH (ref 70–99)
Glucose, Bld: 128 mg/dL — ABNORMAL HIGH (ref 70–99)
Potassium: 5.3 mmol/L — ABNORMAL HIGH (ref 3.5–5.1)
Potassium: 4.5 mmol/L (ref 3.5–5.1)
Sodium: 141 mmol/L (ref 135–145)
Sodium: 138 mmol/L (ref 135–145)

## 2021-09-22 LAB — CBC
HCT: 30.6 % — ABNORMAL LOW (ref 36.0–46.0)
Hemoglobin: 8.3 g/dL — ABNORMAL LOW (ref 12.0–15.0)
MCH: 23.2 pg — ABNORMAL LOW (ref 26.0–34.0)
MCHC: 27.1 g/dL — ABNORMAL LOW (ref 30.0–36.0)
MCV: 85.7 fL (ref 80.0–100.0)
Platelets: 376 10*3/uL (ref 150–400)
RBC: 3.57 MIL/uL — ABNORMAL LOW (ref 3.87–5.11)
RDW: 15.8 % — ABNORMAL HIGH (ref 11.5–15.5)
WBC: 18 10*3/uL — ABNORMAL HIGH (ref 4.0–10.5)
nRBC: 0 % (ref 0.0–0.2)

## 2021-09-22 MED ORDER — CEFPODOXIME PROXETIL 200 MG PO TABS: 200.0000 mg | ORAL_TABLET | Freq: Two times a day (BID) | ORAL | 0 refills | Status: AC

## 2021-09-22 MED ORDER — PREDNISONE 10 MG PO TABS: ORAL_TABLET | ORAL | 0 refills | Status: DC

## 2021-09-22 MED ORDER — PREDNISONE 20 MG PO TABS: 20.0000 mg | ORAL_TABLET | Freq: Every day | ORAL | Status: DC

## 2021-09-22 NOTE — Progress Notes (Signed)
OT Cancellation Note and Discharge  Patient Details Name: [name] MRN: [id number] DOB: 03-03-1966   Cancelled Treatment:    Reason Eval/Treat Not Completed: OT screened, no needs identified, will sign off. Pt to D/C home with Hospice today so will not eval. Per chart pt's husband assists prn for certain aspects of ADLs of which pt cannot do on her own.  Ignacia Palma, OTR/L Acute Rehab Services Pager [phone] Office [phone]    Evette Georges 09/22/2021, 2:50 PM

## 2021-09-22 NOTE — TOC Transition Note (Signed)
Transition of Care [facility]) - CM/SW Discharge Note   Patient Details  Name: [name] MRN: [id number] Date of Birth: 03/09/1966  Transition of [facility]) CM/SW Contact:  Carles Collet, RN Phone Number: 09/22/2021, 11:41 AM   Clinical Narrative:    [name] w patient's spouse, he confirmed he is home and available all day to receive patient. Verified demographics and DME including oxygen is set up in the home.  Patient is active w [facility] for [facility]. [facility] notified for pickup. Notified [name] w [facility] that patient will DC today. Forms on chart, requested DNR from MD. Nurse aware.  No other TOC needs at this time    Final next level of care: Home w Hospice Care Barriers to Discharge: No Barriers Identified   Patient Goals and CMS Choice Patient states their goals for this hospitalization and ongoing recovery are:: return home with hospice      Discharge Placement                       Discharge Plan and Services   Discharge Planning Services: CM Consult Post Acute Care Choice: Resumption of Svcs/PTA Provider            DME Agency: NA       HH Arranged: RN [place] Agency:  ([place]) Date [place]: 09/22/21 Time [place]: 1140 Representative spoke with at [place]: [place] ([place]) Interventions     Readmission Risk Interventions Readmission Risk Prevention Plan 02/26/2021  Transportation Screening Complete  PCP or Specialist Appt within 3-5 Days Complete  HRI or [place] Complete  Social Work Consult for [place] Planning/Counseling Complete  Palliative Care Screening Complete  Medication Review Press photographer) Referral to Pharmacy  Some recent data might be hidden

## 2021-09-22 NOTE — Discharge Summary (Signed)
Physician Discharge Summary  DARCHELLE NUNES ZOX:[id number] DOB: February 16, 1966  PCP: [name], MD  Admitted from: Home Discharged to: Home with hospice  Admit date: 09/19/2021 Discharge date: 09/22/2021  Recommendations for Outpatient Follow-up:    Follow-up Information     [name], MD. Schedule an appointment as soon as possible for a visit in 1 week(s).   Specialty: Family Medicine Why: If patient wishes to have lab work done, then can have labs (CBC & BMP) drawn and reviewed. Contact information: [address] [place] [address] [phone]         Carmine Savoy, MD Follow up.   Specialty: Hematology Why: Follow-up with [facility]) for home hospice needs. Contact information: [address] [place] [address] [phone]                  Home Health:  Home Health Orders (From admission, onward)     Start     Ordered   09/22/21 1049  Home Health  At discharge       Comments: Lower extremity wound care instructions as per WOC RN recommendations as follows:  Wound care  Daily      Comments: Wound care to bilateral LEs:  Cleanse with soap and water, rinse and pat dry.  Cover full thickness wounds (2) on the Right LE and partial thickness wound on the left LE with size appropriate piece of folded xeroform gauze [place] # [id number]), cover with dry gauze, ABD pads and wrap from just below toes to just below knees with Kerlix roll gauze.  Secure with paper tape. Change daily.  Place feet into [employer].  Question Answer Comment  To provide the following care/treatments PT   To provide the following care/treatments RN      09/22/21 1053             Equipment/Devices: None    Discharge Condition: Improved and stable   Code Status: DNR Diet recommendation:  Discharge Diet Orders (From admission, onward)     Start     Ordered   09/22/21 0000  Diet - low sodium heart healthy        09/22/21 1053              Discharge Diagnoses:  Principal Problem:   Acute on chronic respiratory failure with hypoxia and hypercapnia (HCC) Active Problems:   Chronic pain   Acute on chronic heart failure with preserved ejection fraction (HFpEF) (HCC)   Acute exacerbation of chronic obstructive pulmonary disease (COPD) (HCC)   Brief Summary: 56 year old female, lives at home with hospice, medical history significant for COPD, chronic respiratory failure with hypoxia on home oxygen (reportedly 7 L/min) and nightly BiPAP, chronic diastolic CHF, chronic pain, GERD, HTN, presented to ED with progressively worsening subacute on chronic dyspnea, productive cough, worsening bilateral leg swelling.  In ED, tachypneic with respiratory distress.  She was transitioned from CPAP to BiPAP.  Admitted for acute on chronic respiratory failure with hypoxia and hypercarbia due to possible pneumonia, COPD exacerbation and acute on chronic diastolic CHF.       Assessment & Plan:   Acute on chronic hypoxic and hypercarbic respiratory failure: Due to pneumococcal pneumonia, COPD exacerbation and acute on chronic diastolic CHF Reportedly on home oxygen 7 L/min and nightly BiPAP (?  CPAP) VBG on admission: pH 7.33, PCO2 75.1, PO2 48. Remained on BiPAP overnight of admission. Treat underlying etiologies as below. Weaned off BiPAP 1/12.  Currently  on prior home level of oxygen 7 L/min.  Titrate oxygen to maintain saturations between 89-92%. Added flutter valve, budesonide and Pulmicort nebs.  Resumed home dose of Singulair. Clinically improved and breathing and oxygen requirements back to her prior baseline. Metabolic alkalosis, secondary to secondary compensation for her respiratory status and recent IV Lasix diuresis   Pneumococcal pneumonia Treated with 3 days of IV ceftriaxone which she tolerated.  Urine pneumococcal antigen positive, Legionella antigen negative.  Discontinued azithromycin. Transitioned to cefpodoxime x4  days to complete total 7 days course.   COPD exacerbation, oxygen and steroid-dependent. Treated with IV steroids, duo nebs, as needed albuterol nebs. Added flutter valve. Clinically improved.  Transitioned to prednisone 40 Mg daily at discharge and quick prednisone taper and back to her prior home dose of chronic prednisone 20 Mg daily which she has been on for a long time.   Acute on chronic diastolic CHF 2D echo 09/06/2020: LVEF 65-70%. Treated with IV Lasix 20 mg twice daily. -Clinically euvolemic.  Continue prior home dose of as needed Lasix.  Hypomagnesemia: Has been aggressively replaced.  Leukocytosis: Most likely related to steroids.   Anemia Baseline hemoglobin not entirely clear.  Hemoglobin stable in the mid 8 g range since admission.  Had hemoglobin of 11-12 in June 2022. No bleeding reported.   Chronic pain Resumed MS Contin, as needed Vicodin, gabapentin.  Holding liquid morphine for now.  Controlled.  Per patient and spouse, takes as needed Vicodin mostly for pain and as needed liquid morphine for severe dyspnea.  Continues with MS Contin.  Continue prior home regimen.   Chronic leg edema and wounds Per patient, she and spouse wrapped/dressed leg at home themselves. WOC RN input appreciated.  Continue dressing changes as per their recommendation.   Body mass index is 28.86 kg/m./Overweight      Consultants:   None   Procedures:   BiPAP   Discharge Instructions  Discharge Instructions     (HEART FAILURE PATIENTS) Call MD:  Anytime you have any of the following symptoms: 1) 3 pound weight gain in 24 hours or 5 pounds in 1 week 2) shortness of breath, with or without a dry hacking cough 3) swelling in the hands, feet or stomach 4) if you have to sleep on extra pillows at night in order to breathe.   Complete by: As directed    Call MD for:  difficulty breathing, headache or visual disturbances   Complete by: As directed    Call MD for:  extreme fatigue    Complete by: As directed    Call MD for:  persistant dizziness or light-headedness   Complete by: As directed    Call MD for:  persistant nausea and vomiting   Complete by: As directed    Call MD for:  redness, tenderness, or signs of infection (pain, swelling, redness, odor or green/yellow discharge around incision site)   Complete by: As directed    Call MD for:  severe uncontrolled pain   Complete by: As directed    Call MD for:  temperature >100.4   Complete by: As directed    Diet - low sodium heart healthy   Complete by: As directed    Discharge wound care:   Complete by: As directed    Wound care  Daily      Comments: Wound care to bilateral LEs:  Cleanse with soap and water, rinse and pat dry.  Cover full thickness wounds (2) on the Right LE and partial  thickness wound on the left LE with size appropriate piece of folded xeroform gauze [place] # [id number]), cover with dry gauze, ABD pads and wrap from just below toes to just below knees with Kerlix roll gauze.  Secure with paper tape. Change daily.  Place feet into [employer].   Increase activity slowly   Complete by: As directed         Medication List     STOP taking these medications    doxycycline 100 MG tablet Commonly known as: VIBRA-TABS   senna-docusate 8.6-50 MG tablet Commonly known as: Senokot-S   traZODone 50 MG tablet Commonly known as: DESYREL   zinc sulfate 220 (50 Zn) MG capsule       TAKE these medications    albuterol (2.5 MG/3ML) 0.083% nebulizer solution Commonly known as: PROVENTIL Inhale 3 mLs into the lungs every 4 (four) hours.   cefpodoxime 200 MG tablet Commonly known as: VANTIN Take 1 tablet (200 mg total) by mouth 2 (two) times daily for 4 days.   cetirizine 10 MG tablet Commonly known as: ZYRTEC Take 10 mg daily by mouth.   Diflucan 100 MG tablet Generic drug: fluconazole Take 100 mg by mouth daily.   DULoxetine 30 MG capsule Commonly known as: CYMBALTA Take 30 mg by  mouth 2 (two) times daily.   fluticasone 50 MCG/ACT nasal spray Commonly known as: FLONASE Place 1 spray into both nostrils daily as needed for rhinitis or allergies.   furosemide 20 MG tablet Commonly known as: Lasix Take 1 tablet (20 mg total) by mouth daily as needed for edema (weight gain of 3lbs in 1 day or 5 lbs in 2 days.).   gabapentin 400 MG capsule Commonly known as: NEURONTIN Take 400 mg by mouth 3 (three) times daily.   HYDROcodone-acetaminophen 10-325 MG tablet Commonly known as: NORCO Take 1 tablet by mouth every 6 (six) hours as needed (breakthrough pain).   ipratropium-albuterol 0.5-2.5 (3) MG/3ML Soln Commonly known as: DUONEB Take 3 mLs by nebulization 4 (four) times daily.   montelukast 10 MG tablet Commonly known as: SINGULAIR Take 10 mg at bedtime by mouth.   morphine 30 MG 12 hr tablet Commonly known as: MS CONTIN Take 30 mg by mouth every 12 (twelve) hours.   morphine 10 MG/5ML solution Take 2.5 mg by mouth every 4 (four) hours as needed for severe pain.   nicotine 21 mg/24hr patch Commonly known as: NICODERM CQ - dosed in mg/24 hours Place 21 mg onto the skin daily.   nystatin-triamcinolone ointment Commonly known as: MYCOLOG Apply 1 application topically 2 (two) times daily as needed (rash).   OXYGEN Inhale 4-6 L/min into the lungs See admin instructions. 4 L/min at rest and 6 L/min when exerted   pantoprazole 40 MG tablet Commonly known as: PROTONIX Take 40 mg by mouth 2 (two) times daily.   polyethylene glycol powder 17 GM/SCOOP powder Commonly known as: GLYCOLAX/MIRALAX Take 17 g by mouth daily as needed for mild constipation.   predniSONE 10 MG tablet Commonly known as: DELTASONE Take 4 tabs (40 mg total) daily x2 days, then 3 tabs (30 mg total) daily, x2 days, then continue your prior home regimen of prednisone 20 Mg daily indefinitely. Start taking on: September 23, 2021 What changed: You were already taking a medication with the  same name, and this prescription was added. Make sure you understand how and when to take each.   predniSONE 20 MG tablet Commonly known as: DELTASONE Take 1 tablet (  20 mg total) by mouth daily. Start taking on: September 27, 2021 What changed: These instructions start on September 27, 2021. If you are unsure what to do until then, ask your doctor or other care provider.   Santyl ointment Generic drug: collagenase Apply 1 application topically See admin instructions. Apply daily as directed to wound on left shin   Vitamin D (Ergocalciferol) 1.25 MG (50000 UNIT) Caps capsule Commonly known as: DRISDOL Take 50,000 Units by mouth every Monday.       Allergies  Allergen Reactions   Amoxicillin-Pot Clavulanate Itching    Ok with benadryl Did it involve swelling of the face/tongue/throat, SOB, or low BP? No Did it involve sudden or severe rash/hives, skin peeling, or any reaction on the inside of your mouth or nose? No Did you need to seek medical attention at a hospital or doctor's office? No When did it last happen?      6 months If all above answers are NO, may proceed with cephalosporin use.    Erythromycin Rash and Other (See Comments)    Ok with benadryl    Moxifloxacin Itching and Other (See Comments)    Reaction to Avelox - ok with benadryl   Sulfamethoxazole-Trimethoprim Itching and Other (See Comments)    Ok with benadryl      Procedures/Studies: DG Chest Port 1 View  Result Date: 09/19/2021 CLINICAL DATA:  Dyspnea EXAM: PORTABLE CHEST 1 VIEW COMPARISON:  02/25/2021, CT 11/11/2020 FINDINGS: Emphysema and chronic interstitial lung disease. Increased right greater than left basilar airspace opacity. Cardiomegaly. No pneumothorax. IMPRESSION: 1. Underlying emphysema and chronic lung disease. Increased right greater than left basilar airspace opacity concerning for superimposed pneumonia and possible right effusion 2. Cardiomegaly with mild vascular congestion Electronically  Signed   By: Jasmine PangKim  Fujinaga M.D.   On: 09/19/2021 19:39      Subjective: Asking if she can go home.  Dyspnea significantly improved and breathing has been back to her baseline.  No other complaints reported.  Spouse at bedside.  Discharge Exam:  Vitals:   09/22/21 0008 09/22/21 0400 09/22/21 0729 09/22/21 0813  BP: 116/81 113/90  116/85  Pulse: 93 92 94 95  Resp:   18 18  Temp: 98.8 F (37.1 C) 98.5 F (36.9 C)  98.6 F (37 C)  TempSrc: Oral Oral  Oral  SpO2: 96% 100%  100%  Weight:  74.5 kg    Height:       General exam: Young female, looks older than stated age, lying comfortably propped up in bed without distress.  Thick neck.   Respiratory system: Distant breath sounds.  Occasional rhonchi posteriorly but otherwise clear to auscultation.  No crackles.  No increased work of breathing.  Able to speak in full sentences. Cardiovascular system: S1 & S2 heard, RRR. No murmurs, rubs, gallops or clicks.  Bilateral chronic leg edema has almost resolved and now has chronic changes including lichenification and wrinkling of skin.  Unable to appreciate JVD given body habitus.  Telemetry personally reviewed: Sinus rhythm. Gastrointestinal system: Abdomen is nondistended, soft and nontender. No organomegaly or masses felt. Normal bowel sounds heard. Central nervous system: Alert and oriented. No focal neurological deficits. Extremities: Symmetric 5 x 5 power.  Bilateral legs with Ace wrapping, chronic edema changes without acute findings.  Has [employer] boots. Skin: No rashes, lesions or ulcers Psychiatry: Judgement and insight appear normal. Mood & affect appropriate.    The results of significant diagnostics from this hospitalization (including imaging, microbiology, ancillary and  laboratory) are listed below for reference.     Microbiology: Recent Results (from the past 240 hour(s))  Resp Panel by RT-PCR (Flu A&B, Covid) Nasopharyngeal Swab     Status: None   Collection Time: 09/19/21   7:07 PM   Specimen: Nasopharyngeal Swab; Nasopharyngeal(NP) swabs in vial transport medium  Result Value Ref Range Status   SARS Coronavirus 2 by RT PCR NEGATIVE NEGATIVE Final    Comment: (NOTE) SARS-CoV-2 target nucleic acids are NOT DETECTED.  The SARS-CoV-2 RNA is generally detectable in upper respiratory specimens during the acute phase of infection. The lowest concentration of SARS-CoV-2 viral copies this assay can detect is 138 copies/mL. A negative result does not preclude SARS-Cov-2 infection and should not be used as the sole basis for treatment or other patient management decisions. A negative result may occur with  improper specimen collection/handling, submission of specimen other than nasopharyngeal swab, presence of viral mutation(s) within the areas targeted by this assay, and inadequate number of viral copies(<138 copies/mL). A negative result must be combined with clinical observations, patient history, and epidemiological information. The expected result is Negative.  Fact Sheet for Patients:  BloggerCourse.com  Fact Sheet for Healthcare Providers:  SeriousBroker.it  This test is no t yet approved or cleared by the [place] FDA and  has been authorized for detection and/or diagnosis of SARS-CoV-2 by FDA under an Emergency Use Authorization (EUA). This EUA will remain  in effect (meaning this test can be used) for the duration of the COVID-19 declaration under Section 564(b)(1) of the Act, 21 U.S.C.section 360bbb-3(b)(1), unless the authorization is terminated  or revoked sooner.       Influenza A by PCR NEGATIVE NEGATIVE Final   Influenza B by PCR NEGATIVE NEGATIVE Final    Comment: (NOTE) The Xpert Xpress SARS-CoV-2/FLU/RSV plus assay is intended as an aid in the diagnosis of influenza from Nasopharyngeal swab specimens and should not be used as a sole basis for treatment. Nasal washings and aspirates  are unacceptable for Xpert Xpress SARS-CoV-2/FLU/RSV testing.  Fact Sheet for Patients: BloggerCourse.com  Fact Sheet for Healthcare Providers: SeriousBroker.it  This test is not yet approved or cleared by the [place] FDA and has been authorized for detection and/or diagnosis of SARS-CoV-2 by FDA under an Emergency Use Authorization (EUA). This EUA will remain in effect (meaning this test can be used) for the duration of the COVID-19 declaration under Section 564(b)(1) of the Act, 21 U.S.C. section 360bbb-3(b)(1), unless the authorization is terminated or revoked.  Performed at [facility], [address], [place], [place] [address]      Labs: CBC: Recent Labs  Lab 09/19/21 1945 09/19/21 1953 09/20/21 0659 09/21/21 0236 09/22/21 0431  WBC 23.9*  --  14.7* 18.1* 18.0*  NEUTROABS 22.9*  --   --   --   --   HGB 8.5* 10.2* 8.6* 8.5* 8.3*  HCT 30.6* 30.0* 32.6* 29.8* 30.6*  MCV 86.0  --  86.9 83.9 85.7  PLT 383  --  367 365 376    Basic Metabolic Panel: Recent Labs  Lab 09/19/21 1945 09/19/21 1953 09/20/21 0659 09/20/21 1631 09/21/21 0236 09/22/21 0431 09/22/21 0906  NA 138 135 140  --  140 141 138  K 4.2 5.7* 4.2  --  3.5 5.3* 4.5  CL 95*  --  94*  --  93* 89* 88*  CO2 33*  --  32  --  41* 43* 42*  GLUCOSE 180*  --  142*  --  141* 111* 128*  BUN 8  --  6  --  14 18 16   CREATININE 0.66  --  0.55  --  0.66 0.78 0.76  CALCIUM 7.9*  --  8.1*  --  7.9* 8.6* 8.6*  MG 1.4*  --   --  1.5* 2.9*  --   --     Liver Function Tests: Recent Labs  Lab 09/19/21 1945  AST 32  ALT 26  ALKPHOS 114  BILITOT 0.4  PROT 5.7*  ALBUMIN 3.1*    CBG: Recent Labs  Lab 09/19/21 1852  GLUCAP 204*    Discussed in detail with patient's spouse at bedside.  Time coordinating discharge: 35 minutes  SIGNED:  Marcellus Scott, MD,  FACP, [facility], [facility], [facility] (Care Management Physician Certified). [facility]  Physician Advisor  To contact the attending provider between 7A-7P or the covering provider during after hours 7P-7A, please log into the web site [email or web address] and access using universal [place] password for that web site. If you do not have the password, please call the hospital operator.

## 2021-09-22 NOTE — Discharge Instructions (Signed)

## 2021-10-11 ENCOUNTER — Emergency Department (HOSPITAL_COMMUNITY)

## 2021-10-11 ENCOUNTER — Other Ambulatory Visit: Payer: Self-pay

## 2021-10-11 ENCOUNTER — Encounter (HOSPITAL_COMMUNITY): Payer: Self-pay | Admitting: Internal Medicine

## 2021-10-11 ENCOUNTER — Inpatient Hospital Stay (HOSPITAL_COMMUNITY)
Admission: EM | Admit: 2021-10-11 | Discharge: 2021-10-14 | DRG: 189 | Disposition: A | Discharge status: Hospice | Attending: Internal Medicine | Admitting: Internal Medicine

## 2021-10-11 DIAGNOSIS — Z79899 Other long term (current) drug therapy: Secondary | ICD-10-CM

## 2021-10-11 DIAGNOSIS — J9622 Acute and chronic respiratory failure with hypercapnia: Principal | ICD-10-CM | POA: Diagnosis present

## 2021-10-11 DIAGNOSIS — Z91199 Patient's noncompliance with other medical treatment and regimen due to unspecified reason: Secondary | ICD-10-CM

## 2021-10-11 DIAGNOSIS — Z880 Allergy status to penicillin: Secondary | ICD-10-CM

## 2021-10-11 DIAGNOSIS — Z79891 Long term (current) use of opiate analgesic: Secondary | ICD-10-CM | POA: Diagnosis not present

## 2021-10-11 DIAGNOSIS — Z515 Encounter for palliative care: Secondary | ICD-10-CM | POA: Diagnosis not present

## 2021-10-11 DIAGNOSIS — G4730 Sleep apnea, unspecified: Secondary | ICD-10-CM | POA: Diagnosis present

## 2021-10-11 DIAGNOSIS — Z7951 Long term (current) use of inhaled steroids: Secondary | ICD-10-CM | POA: Diagnosis not present

## 2021-10-11 DIAGNOSIS — J9621 Acute and chronic respiratory failure with hypoxia: Principal | ICD-10-CM | POA: Diagnosis present

## 2021-10-11 DIAGNOSIS — Z9981 Dependence on supplemental oxygen: Secondary | ICD-10-CM | POA: Diagnosis not present

## 2021-10-11 DIAGNOSIS — Z66 Do not resuscitate: Secondary | ICD-10-CM | POA: Diagnosis present

## 2021-10-11 DIAGNOSIS — Z825 Family history of asthma and other chronic lower respiratory diseases: Secondary | ICD-10-CM

## 2021-10-11 DIAGNOSIS — I5032 Chronic diastolic (congestive) heart failure: Secondary | ICD-10-CM | POA: Diagnosis present

## 2021-10-11 DIAGNOSIS — R4182 Altered mental status, unspecified: Secondary | ICD-10-CM | POA: Diagnosis present

## 2021-10-11 DIAGNOSIS — K219 Gastro-esophageal reflux disease without esophagitis: Secondary | ICD-10-CM | POA: Diagnosis present

## 2021-10-11 DIAGNOSIS — Z96642 Presence of left artificial hip joint: Secondary | ICD-10-CM | POA: Diagnosis present

## 2021-10-11 DIAGNOSIS — Z720 Tobacco use: Secondary | ICD-10-CM | POA: Diagnosis present

## 2021-10-11 DIAGNOSIS — Z7952 Long term (current) use of systemic steroids: Secondary | ICD-10-CM

## 2021-10-11 DIAGNOSIS — D649 Anemia, unspecified: Secondary | ICD-10-CM | POA: Diagnosis present

## 2021-10-11 DIAGNOSIS — J449 Chronic obstructive pulmonary disease, unspecified: Secondary | ICD-10-CM | POA: Diagnosis present

## 2021-10-11 DIAGNOSIS — Z8261 Family history of arthritis: Secondary | ICD-10-CM

## 2021-10-11 DIAGNOSIS — J309 Allergic rhinitis, unspecified: Secondary | ICD-10-CM | POA: Diagnosis present

## 2021-10-11 DIAGNOSIS — G894 Chronic pain syndrome: Secondary | ICD-10-CM | POA: Diagnosis not present

## 2021-10-11 DIAGNOSIS — Z8616 Personal history of COVID-19: Secondary | ICD-10-CM

## 2021-10-11 DIAGNOSIS — Z801 Family history of malignant neoplasm of trachea, bronchus and lung: Secondary | ICD-10-CM

## 2021-10-11 DIAGNOSIS — G8929 Other chronic pain: Secondary | ICD-10-CM | POA: Diagnosis present

## 2021-10-11 DIAGNOSIS — Z882 Allergy status to sulfonamides status: Secondary | ICD-10-CM

## 2021-10-11 DIAGNOSIS — F17210 Nicotine dependence, cigarettes, uncomplicated: Secondary | ICD-10-CM | POA: Diagnosis present

## 2021-10-11 DIAGNOSIS — Z881 Allergy status to other antibiotic agents status: Secondary | ICD-10-CM | POA: Diagnosis not present

## 2021-10-11 DIAGNOSIS — I110 Hypertensive heart disease with heart failure: Secondary | ICD-10-CM | POA: Diagnosis present

## 2021-10-11 DIAGNOSIS — I10 Essential (primary) hypertension: Secondary | ICD-10-CM | POA: Diagnosis present

## 2021-10-11 LAB — CBC WITH DIFFERENTIAL/PLATELET
Abs Immature Granulocytes: 0.33 10*3/uL — ABNORMAL HIGH (ref 0.00–0.07)
Basophils Absolute: 0.1 10*3/uL (ref 0.0–0.1)
Basophils Relative: 1 %
Eosinophils Absolute: 0.6 10*3/uL — ABNORMAL HIGH (ref 0.0–0.5)
Eosinophils Relative: 2 %
HCT: 36.4 % (ref 36.0–46.0)
Hemoglobin: 10.2 g/dL — ABNORMAL LOW (ref 12.0–15.0)
Immature Granulocytes: 1 %
Lymphocytes Relative: 10 %
Lymphs Abs: 2.8 10*3/uL (ref 0.7–4.0)
MCH: 24 pg — ABNORMAL LOW (ref 26.0–34.0)
MCHC: 28 g/dL — ABNORMAL LOW (ref 30.0–36.0)
MCV: 85.6 fL (ref 80.0–100.0)
Monocytes Absolute: 1.2 10*3/uL — ABNORMAL HIGH (ref 0.1–1.0)
Monocytes Relative: 4 %
Neutro Abs: 22.7 10*3/uL — ABNORMAL HIGH (ref 1.7–7.7)
Neutrophils Relative %: 82 %
Platelets: 338 10*3/uL (ref 150–400)
RBC: 4.25 MIL/uL (ref 3.87–5.11)
RDW: 16.1 % — ABNORMAL HIGH (ref 11.5–15.5)
WBC: 27.7 10*3/uL — ABNORMAL HIGH (ref 4.0–10.5)
nRBC: 0 % (ref 0.0–0.2)

## 2021-10-11 LAB — RESP PANEL BY RT-PCR (FLU A&B, COVID) ARPGX2
Influenza A by PCR: NEGATIVE
Influenza B by PCR: NEGATIVE
SARS Coronavirus 2 by RT PCR: NEGATIVE

## 2021-10-11 LAB — I-STAT VENOUS BLOOD GAS, ED
Acid-Base Excess: 13 mmol/L — ABNORMAL HIGH (ref 0.0–2.0)
Bicarbonate: 44.4 mmol/L — ABNORMAL HIGH (ref 20.0–28.0)
Calcium, Ion: 1.12 mmol/L — ABNORMAL LOW (ref 1.15–1.40)
HCT: 38 % (ref 36.0–46.0)
Hemoglobin: 12.9 g/dL (ref 12.0–15.0)
O2 Saturation: 93 %
Potassium: 3.1 mmol/L — ABNORMAL LOW (ref 3.5–5.1)
Sodium: 136 mmol/L (ref 135–145)
TCO2: 48 mmol/L — ABNORMAL HIGH (ref 22–32)
pCO2, Ven: 103.1 mmHg (ref 44.0–60.0)
pH, Ven: 7.243 — ABNORMAL LOW (ref 7.250–7.430)
pO2, Ven: 86 mmHg — ABNORMAL HIGH (ref 32.0–45.0)

## 2021-10-11 LAB — BASIC METABOLIC PANEL
Anion gap: 12 (ref 5–15)
BUN: 8 mg/dL (ref 6–20)
CO2: 38 mmol/L — ABNORMAL HIGH (ref 22–32)
Calcium: 8.9 mg/dL (ref 8.9–10.3)
Chloride: 89 mmol/L — ABNORMAL LOW (ref 98–111)
Creatinine, Ser: 0.8 mg/dL (ref 0.44–1.00)
GFR, Estimated: >60 mL/min (ref >60)
Glucose, Bld: 149 mg/dL — ABNORMAL HIGH (ref 70–99)
Potassium: 3.2 mmol/L — ABNORMAL LOW (ref 3.5–5.1)
Sodium: 139 mmol/L (ref 135–145)

## 2021-10-11 LAB — CBG MONITORING, ED: Glucose-Capillary: 146 mg/dL — ABNORMAL HIGH (ref 70–99)

## 2021-10-11 LAB — BRAIN NATRIURETIC PEPTIDE: B Natriuretic Peptide: 905.7 pg/mL — ABNORMAL HIGH (ref 0.0–100.0)

## 2021-10-11 MED ORDER — SODIUM CHLORIDE 0.9 % IV SOLN
2.0000 g | Freq: Three times a day (TID) | INTRAVENOUS | Status: DC
  Administered 2021-10-11 – 2021-10-14 (×9): 2 g via INTRAVENOUS
  Filled 2021-10-11 (×9): qty 2

## 2021-10-11 MED ORDER — ENOXAPARIN SODIUM 40 MG/0.4ML IJ SOSY
40.0000 mg | PREFILLED_SYRINGE | INTRAMUSCULAR | Status: DC
  Administered 2021-10-11 – 2021-10-13 (×3): 40 mg via SUBCUTANEOUS
  Filled 2021-10-11 (×3): qty 0.4

## 2021-10-11 MED ORDER — LACTATED RINGERS IV SOLN: INTRAVENOUS | Status: AC

## 2021-10-11 MED ORDER — IPRATROPIUM-ALBUTEROL 0.5-2.5 (3) MG/3ML IN SOLN
3.0000 mL | Freq: Four times a day (QID) | RESPIRATORY_TRACT | Status: DC
  Administered 2021-10-11 – 2021-10-12 (×3): 3 mL via RESPIRATORY_TRACT
  Filled 2021-10-11 (×2): qty 3

## 2021-10-11 MED ORDER — LORATADINE 10 MG PO TABS
10.0000 mg | ORAL_TABLET | Freq: Every day | ORAL | Status: DC
  Administered 2021-10-13 – 2021-10-14 (×2): 10 mg via ORAL
  Filled 2021-10-11 (×2): qty 1

## 2021-10-11 MED ORDER — ACETAMINOPHEN 325 MG PO TABS
650.0000 mg | ORAL_TABLET | Freq: Four times a day (QID) | ORAL | Status: DC | PRN
  Administered 2021-10-13: 650 mg via ORAL
  Filled 2021-10-11 (×2): qty 2

## 2021-10-11 MED ORDER — MORPHINE SULFATE ER 15 MG PO TBCR
30.0000 mg | EXTENDED_RELEASE_TABLET | Freq: Two times a day (BID) | ORAL | Status: DC
Start: 2021-10-11 — End: 2021-10-14
  Administered 2021-10-12 – 2021-10-14 (×4): 30 mg via ORAL
  Filled 2021-10-11 (×4): qty 2

## 2021-10-11 MED ORDER — NALOXONE HCL 0.4 MG/ML IJ SOLN: 0.4000 mg | INTRAMUSCULAR | Status: DC | PRN

## 2021-10-11 MED ORDER — HYDROCODONE-ACETAMINOPHEN 10-325 MG PO TABS
1.0000 | ORAL_TABLET | Freq: Four times a day (QID) | ORAL | Status: DC | PRN
  Administered 2021-10-13 – 2021-10-14 (×4): 1 via ORAL
  Filled 2021-10-11 (×4): qty 1

## 2021-10-11 MED ORDER — ACETAMINOPHEN 650 MG RE SUPP: 650.0000 mg | Freq: Four times a day (QID) | RECTAL | Status: DC | PRN

## 2021-10-11 MED ORDER — SODIUM CHLORIDE 0.9% FLUSH
3.0000 mL | Freq: Two times a day (BID) | INTRAVENOUS | Status: DC
  Administered 2021-10-11 – 2021-10-14 (×6): 3 mL via INTRAVENOUS

## 2021-10-11 MED ORDER — BISACODYL 5 MG PO TBEC: 5.0000 mg | DELAYED_RELEASE_TABLET | Freq: Every day | ORAL | Status: DC | PRN

## 2021-10-11 MED ORDER — DOCUSATE SODIUM 100 MG PO CAPS
100.0000 mg | ORAL_CAPSULE | Freq: Two times a day (BID) | ORAL | Status: DC
  Administered 2021-10-12 – 2021-10-14 (×4): 100 mg via ORAL
  Filled 2021-10-11 (×4): qty 1

## 2021-10-11 MED ORDER — IPRATROPIUM-ALBUTEROL 0.5-2.5 (3) MG/3ML IN SOLN
3.0000 mL | Freq: Once | RESPIRATORY_TRACT | Status: AC
  Administered 2021-10-11: 3 mL via RESPIRATORY_TRACT
  Filled 2021-10-11: qty 3

## 2021-10-11 MED ORDER — ZOLPIDEM TARTRATE 5 MG PO TABS: 5.0000 mg | ORAL_TABLET | Freq: Every evening | ORAL | Status: DC | PRN

## 2021-10-11 MED ORDER — ONDANSETRON HCL 4 MG PO TABS: 4.0000 mg | ORAL_TABLET | Freq: Four times a day (QID) | ORAL | Status: DC | PRN

## 2021-10-11 MED ORDER — METHYLPREDNISOLONE SODIUM SUCC 125 MG IJ SOLR
125.0000 mg | Freq: Once | INTRAMUSCULAR | Status: AC
  Administered 2021-10-11: 125 mg via INTRAVENOUS
  Filled 2021-10-11: qty 2

## 2021-10-11 MED ORDER — HYDRALAZINE HCL 20 MG/ML IJ SOLN: 5.0000 mg | INTRAMUSCULAR | Status: DC | PRN

## 2021-10-11 MED ORDER — NICOTINE 14 MG/24HR TD PT24
14.0000 mg | MEDICATED_PATCH | Freq: Every day | TRANSDERMAL | Status: DC
  Administered 2021-10-12 – 2021-10-14 (×3): 14 mg via TRANSDERMAL
  Filled 2021-10-11 (×3): qty 1

## 2021-10-11 MED ORDER — POLYETHYLENE GLYCOL 3350 17 G PO PACK: 17.0000 g | PACK | Freq: Every day | ORAL | Status: DC | PRN

## 2021-10-11 MED ORDER — MORPHINE SULFATE (PF) 2 MG/ML IV SOLN
2.0000 mg | INTRAVENOUS | Status: DC | PRN
  Administered 2021-10-11 – 2021-10-13 (×3): 2 mg via INTRAVENOUS
  Filled 2021-10-11 (×3): qty 1

## 2021-10-11 MED ORDER — GUAIFENESIN ER 600 MG PO TB12
600.0000 mg | ORAL_TABLET | Freq: Two times a day (BID) | ORAL | Status: DC | PRN
  Administered 2021-10-13: 600 mg via ORAL
  Filled 2021-10-11: qty 1

## 2021-10-11 MED ORDER — GABAPENTIN 400 MG PO CAPS
400.0000 mg | ORAL_CAPSULE | Freq: Three times a day (TID) | ORAL | Status: DC
  Administered 2021-10-12 – 2021-10-14 (×5): 400 mg via ORAL
  Filled 2021-10-11 (×5): qty 1

## 2021-10-11 MED ORDER — DULOXETINE HCL 30 MG PO CPEP
30.0000 mg | ORAL_CAPSULE | Freq: Two times a day (BID) | ORAL | Status: DC
Start: 2021-10-11 — End: 2021-10-14
  Administered 2021-10-12 – 2021-10-14 (×4): 30 mg via ORAL
  Filled 2021-10-11 (×4): qty 1

## 2021-10-11 MED ORDER — MONTELUKAST SODIUM 10 MG PO TABS
10.0000 mg | ORAL_TABLET | Freq: Every day | ORAL | Status: DC
Start: 2021-10-11 — End: 2021-10-14
  Administered 2021-10-12 – 2021-10-13 (×2): 10 mg via ORAL
  Filled 2021-10-11 (×2): qty 1

## 2021-10-11 MED ORDER — PANTOPRAZOLE SODIUM 40 MG PO TBEC
40.0000 mg | DELAYED_RELEASE_TABLET | Freq: Two times a day (BID) | ORAL | Status: DC
  Administered 2021-10-12 – 2021-10-14 (×4): 40 mg via ORAL
  Filled 2021-10-11 (×4): qty 1

## 2021-10-11 MED ORDER — FLUCONAZOLE 100 MG PO TABS
100.0000 mg | ORAL_TABLET | Freq: Every day | ORAL | Status: DC
  Administered 2021-10-13 – 2021-10-14 (×2): 100 mg via ORAL
  Filled 2021-10-11 (×4): qty 1

## 2021-10-11 MED ORDER — ONDANSETRON HCL 4 MG/2ML IJ SOLN: 4.0000 mg | Freq: Four times a day (QID) | INTRAMUSCULAR | Status: DC | PRN

## 2021-10-11 MED ORDER — MAGNESIUM SULFATE 2 GM/50ML IV SOLN
2.0000 g | Freq: Once | INTRAVENOUS | Status: AC
  Administered 2021-10-11: 2 g via INTRAVENOUS
  Filled 2021-10-11: qty 50

## 2021-10-11 MED ORDER — ALBUTEROL SULFATE (2.5 MG/3ML) 0.083% IN NEBU: 2.5000 mg | INHALATION_SOLUTION | RESPIRATORY_TRACT | Status: DC | PRN

## 2021-10-11 MED ORDER — METHYLPREDNISOLONE SODIUM SUCC 125 MG IJ SOLR
80.0000 mg | Freq: Two times a day (BID) | INTRAMUSCULAR | Status: DC
  Administered 2021-10-12 – 2021-10-14 (×5): 80 mg via INTRAVENOUS
  Filled 2021-10-11 (×5): qty 2

## 2021-10-11 MED ORDER — MORPHINE SULFATE (CONCENTRATE) 10 MG/0.5ML PO SOLN: 10.0000 mg | ORAL | Status: DC | PRN

## 2021-10-11 NOTE — Progress Notes (Addendum)
[facility] ED 033C - [facility]) Hospitalized Hospice Patient  Ms. Sinn has been an [facility] hospice homecare patient with a terminal hospice diagnosis of COPD since June 2022.   Notified by [facility] Triage of patient's arrival via EMS from home to [facility] ED post status fall for evaluation of possible injury and low oxygen saturations. Husband found patient on floor asleep and initiated EMS for lift assistance. Upon EMS arrival, patient was SOB, diaphoretic/pale and hypoxic at 62% O2 sats with complaints of discomfort.      Spoke with patient spouse [name] to support. [name] confirmed patient wishes to pursue comfort care and conservative treatment with medications and breathing treatments in hospital but patient is a DNR and does not wish to be intubated if needed during hospitalization. During bedside patient visit, Made Dr. Rayfield Citizen in [facility] ED aware of patient wishes who plans to consult [facility] Hospitalist regarding hospital admission.  [facility] will continue to follow this patient daily during this hospitalization.  Please feel free to reach out with any hospice related questions as needed,  Roda Shutters, RN [facility] HLT

## 2021-10-11 NOTE — Assessment & Plan Note (Addendum)
On as needed hydralazine.  Blood pressure reasonably well controlled.  Occasional low readings noted.

## 2021-10-11 NOTE — Progress Notes (Signed)
Pt arrived to unit with bipap from ed. Pt is not verbally responsive at this time. Transferred to bed with staff assist.  Vs stable at this time.

## 2021-10-11 NOTE — ED Triage Notes (Signed)
Pt arrived via [facility] from home. Per EMS, they found pt on the floor in home pale, diaphoretic, responsive to painful stimuli. Per family pt has hx of COPD and it on 6L O2 at baseline but was found on RA. Pt was hypoxic at 62% on their arrival with peripheral cyanosis. Pt was placed on O2 via NRB which brought pt to 100% by arrival to ED. Pt arrived to ED alert to painful stimuli. Cyanosis in lower extremities with SpO2 WNL.

## 2021-10-11 NOTE — Progress Notes (Signed)
Pt placed in Bipap by RT, pt tolerating well at this time. RN at bedside, MD aware, RT will continue to monitor.

## 2021-10-11 NOTE — Assessment & Plan Note (Addendum)
-  Continue home MS Contin, MSIR, Norco, Neurontin, Cymbalta.

## 2021-10-11 NOTE — H&P (Signed)
History and Physical    Patient: [name] [id number] DOB: 11-29-1965 DOA: 10/11/2021 DOS: the patient was seen and examined on 10/11/2021 PCP: Chesley Noon, MD  Patient coming from: Home - lives with husband; NOK: Husband, Aneisha Lovern   Chief Complaint: SOB  HPI: [name] is a 56 y.o. 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 She was last hospitalized from 1/11-14 with acute on chronic respiratory failure from pneumococcal PNA, COPD exacerbation, and acute on chronic diastolic CHF.  Her husband reports that she has end-stage COPD and was doing ok since home from her last hospitalization.  On a good day, she is able to walk maybe 10 feet and most days more like 3-4 feet.  She is able to talk and eat and visit with folks but does get winded doing those activities.  She usually wakes him up to help her go to the bedside commode at night but got up on her own last night and was unable to support herself.  Her O2 was off and she was profoundly hypoxic.  The last time this happened, steroids and antibiotics helped and so he would like to try that again.  However, he does realize that she is terminally ill and agrees that if current therapy isn't working we will transition to comfort care.  She wears nocturnal BIPAP at times at home but not consistently.  She was placed on BIPAP in the ER but was eventually removed from BIPAP due to concerns about airway protection.  She is currently on 8L HFNC.    ER Course:  [place] out of hospital bed, off O2, found in 68s.  Family will not do [employer] so hospice is involved but goal is for home.  On BIPAP.     Review of Systems: unable to review all systems due to the inability of the patient to answer questions. Past Medical History:  Diagnosis Date   Allergic rhinitis    Asthma    CHF (congestive heart failure)  (HCC)    Chronic pain    COPD (chronic obstructive pulmonary disease) (HCC)    on home O2   GERD (gastroesophageal reflux disease)    Headache    History of COVID-19    Hypertension    Neck pain    Open wound of left hip    spouse changes dressing q day   Shortness of breath    Sleep apnea    NO DEVICE IN USE, USES SUPPLEMENTAL O2 IN PLACE OF DEVICE    Supplemental oxygen dependent    4L CONTINUOUS    Past Surgical History:  Procedure Laterality Date   COLONOSCOPY     HUMERUS IM NAIL Left 07/23/2017   Procedure: INTRAMEDULLARY (IM) NAIL HUMERAL;  Surgeon: Nicholes Stairs, MD;  Location: [place];  Service: Orthopedics;  Laterality: Left;   INCISION AND DRAINAGE HIP Left 04/01/2019   Procedure: IRRIGATION AND DEBRIDEMENT HIP;  Surgeon: Rod Can, MD;  Location: [facility];  Service: Orthopedics;  Laterality: Left;   LUMBAR LAMINECTOMY/DECOMPRESSION MICRODISCECTOMY  04/06/2012   Procedure: LUMBAR LAMINECTOMY/DECOMPRESSION MICRODISCECTOMY;  Surgeon: Jessy Oto, MD;  Location: [place];  Service: Orthopedics;  Laterality: N/A;  Left L3-4 and L4-5 lateral recess decompression MIS   NECK SURGERY  Sept 2005   TOTAL ABDOMINAL HYSTERECTOMY  Oct 2003   partial   TOTAL HIP ARTHROPLASTY Left  02/18/2019   Procedure: TOTAL HIP ARTHROPLASTY ANTERIOR APPROACH;  Surgeon: Rod Can, MD;  Location: [facility];  Service: Orthopedics;  Laterality: Left;   TUBAL LIGATION  1995   Social History:  reports that she has been smoking cigarettes. She has a 15.00 pack-year smoking history. She has never used smokeless tobacco. She reports that she does not currently use alcohol. She reports that she does not use drugs.  Allergies  Allergen Reactions   Amoxicillin-Pot Clavulanate Itching    Ok with benadryl Did it involve swelling of the face/tongue/throat, SOB, or low BP? No Did it involve sudden or severe rash/hives, skin peeling, or any reaction on the inside of your mouth or nose? No Did you need  to seek medical attention at a hospital or doctor's office? No When did it last happen?      6 months If all above answers are NO, may proceed with cephalosporin use.    Erythromycin Rash and Other (See Comments)    Ok with benadryl    Moxifloxacin Itching and Other (See Comments)    Reaction to Avelox - ok with benadryl   Sulfamethoxazole-Trimethoprim Itching and Other (See Comments)    Ok with benadryl    Family History  Problem Relation Age of Onset   Emphysema Father    Rheum arthritis Father    Lung cancer Father    Emphysema Mother     Prior to Admission medications   Medication Sig Start Date End Date Taking? Authorizing Provider  albuterol (PROVENTIL) (2.5 MG/3ML) 0.083% nebulizer solution Inhale 3 mLs into the lungs every 4 (four) hours. 01/30/21   [provider]  cetirizine (ZYRTEC) 10 MG tablet Take 10 mg daily by mouth.    [provider]  DIFLUCAN 100 MG tablet Take 100 mg by mouth daily. 09/12/21   [provider]  DULoxetine (CYMBALTA) 30 MG capsule Take 30 mg by mouth 2 (two) times daily. 07/12/20   [provider]  fluticasone (FLONASE) 50 MCG/ACT nasal spray Place 1 spray into both nostrils daily as needed for rhinitis or allergies.    [provider]  furosemide (LASIX) 20 MG tablet Take 1 tablet (20 mg total) by mouth daily as needed for edema (weight gain of 3lbs in 1 day or 5 lbs in 2 days.). 07/27/20   Lavina Hamman, MD  gabapentin (NEURONTIN) 400 MG capsule Take 400 mg by mouth 3 (three) times daily. 08/09/20   [provider]  HYDROcodone-acetaminophen (NORCO) 10-325 MG tablet Take 1 tablet by mouth every 6 (six) hours as needed (breakthrough pain). 04/02/19   Swinteck, Aaron Edelman, MD  ipratropium-albuterol (DUONEB) 0.5-2.5 (3) MG/3ML SOLN Take 3 mLs by nebulization 4 (four) times daily. 09/14/21   [provider]  montelukast (SINGULAIR) 10 MG tablet Take 10 mg at bedtime by mouth.    [provider]  morphine (MS CONTIN) 30 MG 12 hr tablet Take 30 mg by mouth every 12 (twelve) hours.    [provider]  morphine 10 MG/5ML solution Take 2.5 mg by mouth every 4 (four) hours as needed for severe pain.    [provider]  nicotine (NICODERM CQ - DOSED IN MG/24 HOURS) 21 mg/24hr patch Place 21 mg onto the skin daily. 01/31/21   [provider]  nystatin-triamcinolone ointment (MYCOLOG) Apply 1 application topically 2 (two) times daily as needed (rash). 10/16/20   [provider]  OXYGEN Inhale 4-6 L/min into the lungs See admin instructions. 4 L/min at  rest and 6 L/min when exerted    [provider]  pantoprazole (PROTONIX) 40 MG tablet Take 40 mg by mouth 2 (two) times daily. 08/16/20   [provider]  polyethylene glycol powder (GLYCOLAX/MIRALAX) 17 GM/SCOOP powder Take 17 g by mouth daily as needed for mild constipation. 09/04/20   [provider]  predniSONE (DELTASONE) 10 MG tablet Take 4 tabs (40 mg total) daily x2 days, then 3 tabs (30 mg total) daily, x2 days, then continue your prior home regimen of prednisone 20 Mg daily indefinitely. 09/23/21   Hongalgi, Lenis Dickinson, MD  predniSONE (DELTASONE) 20 MG tablet Take 1 tablet (20 mg total) by mouth daily. 09/27/21   Hongalgi, Lenis Dickinson, MD  SANTYL ointment Apply 1 application topically See admin instructions. Apply daily as directed to wound on left shin 02/21/21   [provider]  Vitamin D, Ergocalciferol, (DRISDOL) 1.25 MG (50000 UNIT) CAPS capsule Take 50,000 Units by mouth every Monday. 06/15/20   [provider]    Physical Exam: Vitals:   10/11/21 1545 10/11/21 1600 10/11/21 1615 10/11/21 1657  BP: 106/71 105/78 108/82   Pulse: (!) 104 (!) 103 (!) 106   Resp: 17 20 (!) 24   Temp:      TempSrc:      SpO2: 100% 100% 100% 100%   General:  Appears acutely and chronically very ill Eyes:   normal lids, iris - mostly closed ENT:  grossly normal lips & tongue,  mmm Neck:  no LAD, masses or thyromegaly Cardiovascular:  RR with tachycardia, no m/r/g. No LE edema.  Respiratory:   Diffuse wheezing, poor air movement, increased WOB on BIPAP and HFNC Abdomen:  soft, NT, ND Skin:  BLE stasis dermatitis with leg wraps in place Musculoskeletal: decreased tone BUE/BLE, no bony abnormality Lower extremity:  No LE edema.  Limited foot exam with no ulcerations.  2+ distal pulses. Psychiatric:  minimally responsive to pain Neurologic:  unable to perform   Radiological Exams on Admission: Independently reviewed - see discussion in A/P where applicable  DG Chest Port 1 View  Result Date: 10/11/2021 CLINICAL DATA:  sob EXAM: PORTABLE CHEST 1 VIEW COMPARISON:  09/19/2021. FINDINGS: Bibasilar right greater than left opacities. Possible small right pleural effusion. No visible pneumothorax. Cardiomediastinal silhouette is similar to prior and mildly enlarged. ACDF. IMPRESSION: 1. Bibasilar right greater than left opacities, which could represent atelectasis, aspiration, and/or pneumonia. 2. Possible small right pleural effusion. Electronically Signed   By: Margaretha Sheffield M.D.   On: 10/11/2021 12:40    EKG: Independently reviewed.  Sinus tachycardia with rate 113; nonspecific ST changes with NSCSLT   Labs on Admission: I have personally reviewed the available labs and imaging studies at the time of the admission.  Pertinent labs:    VBG: 7.243/103.1/44.4 K+ 3.2 CO2 38 Glucose 149 BNP 905.7 WBC 27.7; 18 on 1/14 Hgb 10.2 - stable    Assessment and Plan: * End stage COPD ([place])- (present on admission) -Patient with known h/o ES-COPD, on hospice -She was recently admitted with acute on chronic respiratory failure that was multifactorial - pneumococcal PNA, COPD exacerbation, and diastolic -She has been home for only a few weeks and is back with recurrent acute on chronic respiratory failure -Possibly recurrent PNA, possibly CHF exacerbation, less likely  currently related to CHF -Regardless, she was profoundly hypoxic (in the 60s) this AM and is currently still very tight with markedly increased WOB -She is on nocturnal BIPAP but is noncompliant with  this; she was placed on BIPAP in the ER but was transitioned back to HFNC due to airway protection concerns -In prolonged discussion with her husband, our plan is to treat for exacerbation with the understanding that she has a poor short-term and long-term prognosis; if not responding to current treatments, will transition to comfort care only -will admit patient - with her failure of outpatient therapy and markedly decreased O2 sats (into the 60s), it seems likely that she will need several days of hospitalization to show sufficient improvement for discharge. -Nebulizers: scheduled Duoneb and prn albuterol -Solu-Medrol 80 mg IV BID -IV Cefepime -Continue Singulair, Claritin (for Zyrtec), add Mucinex -Hospice is aware of admission and will follow along during hospitalization  Chronic diastolic CHF (congestive heart failure) ([place])- (present on admission) -Preserved EF on recent echo, grade 1 diastolic dysfunction in 123456 -Appears to be euvolemic -Unlikely to take sufficient PO intake to need Lasix based on current exam  DNR (do not resuscitate)- (present on admission) -I have discussed code status with the patient and the patient would not desire resuscitation and would prefer to die a natural death should that situation arise. -She has a gold out of facility DNR form at the bedside -Hospice is also involved  Sleep apnea- (present on admission) -Inconsistent use of nocturnal BIPAP, may benefit from consistent use  Tobacco abuse- (present on admission) -Encourage cessation.   -Patch ordered   Chronic pain disorder- (present on admission) -I have reviewed this patient in the [place] Controlled Substances Reporting System.  She is generally receiving medications from only one provider and appears to  be taking them as prescribed. -She is at particularly high risk of opioid misuse, diversion, or overdose. -Continue home MS Contin, MSIR, Norco, Neurontin, Cymbalta -Will add prn IV morphine for breakthrough pain, with Narcan  Essential hypertension- (present on admission) -prn IV hydralazine -Not on home meds currently      Advance Care Planning:   Code Status: DNR   Consults: RT  DVT Prophylaxis: Lovenox  Family Communication: Husband was present throughout evaluation  Severity of Illness: The appropriate patient status for this patient is INPATIENT. Inpatient status is judged to be reasonable and necessary in order to provide the required intensity of service to ensure the patient's safety. The patient's presenting symptoms, physical exam findings, and initial radiographic and laboratory data in the context of their chronic comorbidities is felt to place them at high risk for further clinical deterioration. Furthermore, it is not anticipated that the patient will be medically stable for discharge from the hospital within 2 midnights of admission.   * I certify that at the point of admission it is my clinical judgment that the patient will require inpatient hospital care spanning beyond 2 midnights from the point of admission due to high intensity of service, high risk for further deterioration and high frequency of surveillance required.*  Author: Karmen Bongo, MD 10/11/2021 5:21 PM  For on call review [email or web address].

## 2021-10-11 NOTE — Progress Notes (Signed)
RT placed pt on Bipap, pt tolerating well at this time. RN aware, RT will continue to monitor .  ?

## 2021-10-11 NOTE — Assessment & Plan Note (Addendum)
-  Inconsistent use of nocturnal BIPAP, may benefit from consistent use. She was counseled.

## 2021-10-11 NOTE — Assessment & Plan Note (Addendum)
-  Preserved EF on recent echo, grade 1 diastolic dysfunction in [id number] -Appears to be euvolemic

## 2021-10-11 NOTE — Assessment & Plan Note (Deleted)
-  Encourage cessation.   -Patch ordered  

## 2021-10-11 NOTE — ED Provider Notes (Signed)
[name]ENC[facility] Provider Note   CSN: [id number] Arrival date & time: 10/11/21  1127     History  No chief complaint on file.   [name] is a 56 y.o. female.  Has a history of CHF COPD is on chronic oxygen.  She is on hospice care and follows with [facility].  Here with increased shortness of breath depressed mental status.  Level 5 caveat secondary to altered mental status.  Apparently was found out of bed on the floor not on her oxygen gray unresponsive.  Has a active DNR DNI.  EMS provided with oxygen and transported in position of comfort.  Patient is spontaneously moving all extremities although will not answer any questions.  Labored respirations.  The history is provided by the EMS personnel. The history is limited by the condition of the patient.  Shortness of Breath Severity:  Severe Onset quality:  Unable to specify Timing:  Constant Progression:  Unchanged Relieved by:  Nothing Worsened by:  Nothing Ineffective treatments:  Oxygen     Home Medications Prior to Admission medications   Medication Sig Start Date End Date Taking? Authorizing Provider  albuterol (PROVENTIL) (2.5 MG/3ML) 0.083% nebulizer solution Inhale 3 mLs into the lungs every 4 (four) hours. 01/30/21   [provider]  cetirizine (ZYRTEC) 10 MG tablet Take 10 mg daily by mouth.    [provider]  DIFLUCAN 100 MG tablet Take 100 mg by mouth daily. 09/12/21   [provider]  DULoxetine (CYMBALTA) 30 MG capsule Take 30 mg by mouth 2 (two) times daily. 07/12/20   [provider]  fluticasone (FLONASE) 50 MCG/ACT nasal spray Place 1 spray into both nostrils daily as needed for rhinitis or allergies.    [provider]  furosemide (LASIX) 20 MG tablet Take 1 tablet (20 mg total) by mouth daily as needed for edema (weight gain of 3lbs in 1 day or 5 lbs in 2 days.). 07/27/20   Rolly SalterPatel, Pranav M, MD  gabapentin (NEURONTIN) 400 MG  capsule Take 400 mg by mouth 3 (three) times daily. 08/09/20   [provider]  HYDROcodone-acetaminophen (NORCO) 10-325 MG tablet Take 1 tablet by mouth every 6 (six) hours as needed (breakthrough pain). 04/02/19   Swinteck, Arlys JohnBrian, MD  ipratropium-albuterol (DUONEB) 0.5-2.5 (3) MG/3ML SOLN Take 3 mLs by nebulization 4 (four) times daily. 09/14/21   [provider]  montelukast (SINGULAIR) 10 MG tablet Take 10 mg at bedtime by mouth.    [provider]  morphine (MS CONTIN) 30 MG 12 hr tablet Take 30 mg by mouth every 12 (twelve) hours.    [provider]  morphine 10 MG/5ML solution Take 2.5 mg by mouth every 4 (four) hours as needed for severe pain.    [provider]  nicotine (NICODERM CQ - DOSED IN MG/24 HOURS) 21 mg/24hr patch Place 21 mg onto the skin daily. 01/31/21   [provider]  nystatin-triamcinolone ointment (MYCOLOG) Apply 1 application topically 2 (two) times daily as needed (rash). 10/16/20   [provider]  OXYGEN Inhale 4-6 L/min into the lungs See admin instructions. 4 L/min at rest and 6 L/min when exerted    [provider]  pantoprazole (PROTONIX) 40 MG tablet Take 40 mg by mouth 2 (two) times daily. 08/16/20   [provider]  polyethylene glycol powder (GLYCOLAX/MIRALAX) 17 GM/SCOOP powder Take 17 g by mouth daily as needed for mild constipation. 09/04/20   [provider]  predniSONE (DELTASONE) 10 MG tablet Take 4 tabs (40 mg total) daily x2 days, then 3 tabs (30 mg total) daily, x2 days, then continue your prior home regimen of prednisone 20 Mg daily indefinitely. 09/23/21   Hongalgi, Maximino Greenland, MD  predniSONE (DELTASONE) 20 MG tablet Take 1 tablet (20 mg total) by mouth daily. 09/27/21   Hongalgi, Maximino Greenland, MD  SANTYL ointment Apply 1 application topically See admin instructions. Apply daily as directed to wound on left shin 02/21/21   [provider]  Vitamin D, Ergocalciferol,  (DRISDOL) 1.25 MG (50000 UNIT) CAPS capsule Take 50,000 Units by mouth every Monday. 06/15/20   [provider]      Allergies    Amoxicillin-pot clavulanate, Erythromycin, Moxifloxacin, and Sulfamethoxazole-trimethoprim    Review of Systems   Review of Systems  Unable to perform ROS: Mental status change  Respiratory:  Positive for shortness of breath.    Physical Exam Updated Vital Signs BP 108/82    Pulse (!) 106    Temp 97.9 F (36.6 C) (Rectal)    Resp (!) 24    SpO2 100%  Physical Exam Vitals and nursing note reviewed.  Constitutional:      General: She is in acute distress.     Appearance: She is well-developed. She is obese. She is ill-appearing.  HENT:     Head: Normocephalic and atraumatic.  Eyes:     Conjunctiva/sclera: Conjunctivae normal.  Cardiovascular:     Rate and Rhythm: Normal rate and regular rhythm.     Heart sounds: No murmur heard. Pulmonary:     Effort: Tachypnea, accessory muscle usage and respiratory distress present.     Breath sounds: Wheezing and rhonchi present.  Abdominal:     Palpations: Abdomen is soft.     Tenderness: There is no abdominal tenderness.  Musculoskeletal:        General: No swelling.     Cervical back: Neck supple.     Right lower leg: Edema present.     Left lower leg: Edema present.  Skin:    General: Skin is dry.     Capillary Refill: Capillary refill takes less than 2 seconds.  Neurological:     General: No focal deficit present.     Comments: Will not follow any commands.  She is spontaneously moving her upper and lower extremities though.    ED Results / Procedures / Treatments   Labs (all labs ordered are listed, but only abnormal results are displayed) Labs Reviewed  BASIC METABOLIC PANEL - Abnormal; Notable for the following components:      Result Value   Potassium 3.2 (*)    Chloride 89 (*)    CO2 38 (*)    Glucose, Bld 149 (*)    All other components within normal limits  CBC WITH  DIFFERENTIAL/PLATELET - Abnormal; Notable for the following components:   WBC 27.7 (*)    Hemoglobin 10.2 (*)    MCH 24.0 (*)    MCHC 28.0 (*)    RDW 16.1 (*)    Neutro Abs 22.7 (*)    Monocytes Absolute 1.2 (*)    Eosinophils Absolute 0.6 (*)    Abs Immature Granulocytes 0.33 (*)    All other components within normal limits  BRAIN NATRIURETIC PEPTIDE - Abnormal; Notable for the following components:   B Natriuretic Peptide 905.7 (*)    All other components within normal limits  CBG MONITORING, ED - Abnormal; Notable for the following components:  Glucose-Capillary 146 (*)    All other components within normal limits  I-STAT VENOUS BLOOD GAS, ED - Abnormal; Notable for the following components:   pH, Ven 7.243 (*)    pCO2, Ven 103.1 (*)    pO2, Ven 86.0 (*)    Bicarbonate 44.4 (*)    TCO2 48 (*)    Acid-Base Excess 13.0 (*)    Potassium 3.1 (*)    Calcium, Ion 1.12 (*)    All other components within normal limits  RESP PANEL BY RT-PCR (FLU A&B, COVID) [id number]    EKG EKG Interpretation  Date/Time:  Thursday October 11 2021 11:31:30 EST Ventricular Rate:  113 PR Interval:  131 QRS Duration: 89 QT Interval:  324 QTC Calculation: 445 R Axis:   82 Text Interpretation: Sinus tachycardia Consider left ventricular hypertrophy Repol abnrm suggests ischemia, inferior leads No significant change since last tracing Confirmed by Meridee ScoreButler, Shanequa Whitenight [phone]([id number]) on 10/11/2021 11:43:33 AM  Radiology DG Chest Port 1 View  Result Date: 10/11/2021 CLINICAL DATA:  sob EXAM: PORTABLE CHEST 1 VIEW COMPARISON:  09/19/2021. FINDINGS: Bibasilar right greater than left opacities. Possible small right pleural effusion. No visible pneumothorax. Cardiomediastinal silhouette is similar to prior and mildly enlarged. ACDF. IMPRESSION: 1. Bibasilar right greater than left opacities, which could represent atelectasis, aspiration, and/or pneumonia. 2. Possible small right pleural effusion. Electronically Signed    By: Feliberto HartsFrederick S Jones M.D.   On: 10/11/2021 12:40    Procedures .Critical Care Performed by: Terrilee FilesButler, Kaiden Dardis C, MD Authorized by: Terrilee FilesButler, Madelina Sanda C, MD   Critical care provider statement:    Critical care time (minutes):  45   Critical care time was exclusive of:  Separately billable procedures and treating other patients   Critical care was necessary to treat or prevent imminent or life-threatening deterioration of the following conditions:  CNS failure or compromise and respiratory failure   Critical care was time spent personally by me on the following activities:  Development of treatment plan with patient or surrogate, discussions with consultants, evaluation of patient's response to treatment, examination of patient, obtaining history from patient or surrogate, ordering and performing treatments and interventions, ordering and review of laboratory studies, ordering and review of radiographic studies, pulse oximetry, re-evaluation of patient's condition and review of old charts   I assumed direction of critical care for this patient from another provider in my specialty: no      Medications Ordered in ED Medications - No data to display  ED Course/ Medical Decision Making/ A&P Clinical Course as of 10/11/21 1618  Thu Oct 11, 2021  1201 [facility] nurse contacted and is at bedside.  She is going to talk with family about goals of care. [MB]  1237 Chest x-ray interpreted by me as no clear infiltrate, atelectasis right base.  Waiting radiology reading [MB]  1301 [facility] rep talk to family.  She is recommending that the patient be admitted medically and stabilized.  They will follow along [MB]  1321 Gust with Dr. Ophelia CharterYates [facility] hospitalist who will evaluate the patient for admission [MB]    Clinical Course User Index [MB] Terrilee FilesButler, Gaddiel Cullens C, MD                           Medical Decision Making Amount and/or Complexity of Data Reviewed Labs: ordered. Radiology:  ordered.  Risk Prescription drug management. Decision regarding hospitalization.  Madisen S Sundberg was evaluated in Emergency Department on 10/11/2021 for the symptoms described in  the history of present illness. She was evaluated in the context of the global COVID-19 pandemic, which necessitated consideration that the patient might be at risk for infection with the SARS-CoV-2 virus that causes COVID-19. Institutional protocols and algorithms that pertain to the evaluation of patients at risk for COVID-19 are in a state of rapid change based on information released by regulatory bodies including the CDC and federal and state organizations. These policies and algorithms were followed during the patient's care in the ED.  This patient complains of altered mental status respiratory distress; this involves an extensive number of treatment Options and is a complaint that carries with it a high risk of complications and Morbidity. The differential includes COPD, CHF, hypoxia, hypercapnia, pneumonia, COVID, flu  I ordered, reviewed and interpreted labs, which included CBC with markedly elevated white count stable hemoglobin, chemistries with low potassium chronically elevated CO2 BNP elevated, ABG with acidosis and CO2 retention I ordered medication Solu-Medrol magnesium and DuoNeb for patient's poor ventilation I ordered imaging studies which included chest x-ray and I independently    visualized and interpreted imaging which showed bibasilar atelectasis versus infiltrates Additional history obtained from EMS Previous records obtained and reviewed in epic including recent admission for COPD exacerbation I consulted Dr. Ophelia Charter [facility] hospitalist and discussed lab and imaging findings  Critical Interventions: Management patient's hypoxic hypercapnic respiratory failure in accordance with her DNR/DNI wishes  After the interventions stated above, I reevaluated the patient and found patient to be somewhat  improved.  She is tolerating BiPAP.  Respirations somewhat more efficient.  She will need to be admitted to the hospital further treatment.  May be a candidate for comfort measures.  Hospice helping explore that.          Final Clinical Impression(s) / ED Diagnoses Final diagnoses:  Acute on chronic respiratory failure with hypercapnia [facility])    Rx / DC Orders ED Discharge Orders     None         Terrilee Files, MD 10/11/21 1622

## 2021-10-11 NOTE — Progress Notes (Signed)
RT NOTE:  Pt transferred to 5N30 on BIPAP without event.

## 2021-10-11 NOTE — Assessment & Plan Note (Addendum)
-  Patient with known h/o end-stage-COPD, on hospice -She was recently admitted with acute on chronic respiratory failure that was multifactorial - pneumococcal PNA, COPD exacerbation, and diastolic -She has been home for only a few weeks and is back with recurrent acute on chronic respiratory failure -Possibly recurrent PNA, possibly CHF exacerbation, less likely currently related to CHF Patient was placed on BiPAP followed by high flow nasal cannula and then transitioned back to BiPAP.   Patient stabilized on bipap. She became alert. Was able to stay off of it for several hours. Patient wants to go back home. Will discharge on steroid taper and antibiotics. Will also let her go on inhaled steroids.

## 2021-10-11 NOTE — Progress Notes (Signed)
RT removed pt from Bipap and placed pt on 8L HFNC. Pt tolerating well at this time, RN aware, MD notified, RT will continue to monitor.

## 2021-10-11 NOTE — Assessment & Plan Note (Addendum)
Discussed by admitting provider with patient and husband.  Patient would not desire resuscitation and would prefer to die a natural death should that situation arise. -Hospice is also involved

## 2021-10-12 DIAGNOSIS — J9622 Acute and chronic respiratory failure with hypercapnia: Secondary | ICD-10-CM

## 2021-10-12 DIAGNOSIS — J9621 Acute and chronic respiratory failure with hypoxia: Principal | ICD-10-CM

## 2021-10-12 DIAGNOSIS — J449 Chronic obstructive pulmonary disease, unspecified: Secondary | ICD-10-CM | POA: Diagnosis not present

## 2021-10-12 DIAGNOSIS — I5032 Chronic diastolic (congestive) heart failure: Secondary | ICD-10-CM

## 2021-10-12 DIAGNOSIS — D649 Anemia, unspecified: Secondary | ICD-10-CM | POA: Diagnosis present

## 2021-10-12 LAB — BASIC METABOLIC PANEL
Anion gap: 10 (ref 5–15)
BUN: 13 mg/dL (ref 6–20)
CO2: 37 mmol/L — ABNORMAL HIGH (ref 22–32)
Calcium: 8.4 mg/dL — ABNORMAL LOW (ref 8.9–10.3)
Chloride: 93 mmol/L — ABNORMAL LOW (ref 98–111)
Creatinine, Ser: 0.63 mg/dL (ref 0.44–1.00)
GFR, Estimated: >60 mL/min (ref >60)
Glucose, Bld: 113 mg/dL — ABNORMAL HIGH (ref 70–99)
Potassium: 3.7 mmol/L (ref 3.5–5.1)
Sodium: 140 mmol/L (ref 135–145)

## 2021-10-12 LAB — CBC
HCT: 30.8 % — ABNORMAL LOW (ref 36.0–46.0)
Hemoglobin: 8.9 g/dL — ABNORMAL LOW (ref 12.0–15.0)
MCH: 23.8 pg — ABNORMAL LOW (ref 26.0–34.0)
MCHC: 28.9 g/dL — ABNORMAL LOW (ref 30.0–36.0)
MCV: 82.4 fL (ref 80.0–100.0)
Platelets: 256 10*3/uL (ref 150–400)
RBC: 3.74 MIL/uL — ABNORMAL LOW (ref 3.87–5.11)
RDW: 15.9 % — ABNORMAL HIGH (ref 11.5–15.5)
WBC: 9.1 10*3/uL (ref 4.0–10.5)
nRBC: 0 % (ref 0.0–0.2)

## 2021-10-12 MED ORDER — IPRATROPIUM-ALBUTEROL 0.5-2.5 (3) MG/3ML IN SOLN
3.0000 mL | Freq: Three times a day (TID) | RESPIRATORY_TRACT | Status: DC
  Administered 2021-10-12 – 2021-10-14 (×5): 3 mL via RESPIRATORY_TRACT
  Filled 2021-10-12 (×4): qty 3

## 2021-10-12 NOTE — Hospital Course (Addendum)
56 y.o. 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 She was last hospitalized from 1/11-14 with acute on chronic respiratory failure from pneumococcal PNA, COPD exacerbation, and acute on chronic diastolic CHF.  Her husband reports that she has end-stage COPD and was doing ok since home from her last hospitalization.  On a good day, she is able to walk maybe 10 feet and most days more like 3-4 feet.  She is able to talk and eat and visit with folks but does get winded doing those activities.  She usually wakes him up to help her go to the bedside commode at night but got up on her own last night and was unable to support herself.  Her O2 was off and she was profoundly hypoxic.  The last time this happened, steroids and antibiotics helped and so he would like to try that again.  However, he does realize that she is terminally ill and agrees that if current therapy isn't working we will transition to comfort care.  She wears nocturnal BIPAP at times at home but not consistently.  She was placed on BiPAP and hospitalized.

## 2021-10-12 NOTE — Progress Notes (Signed)
Pt is not on Bi-PAP at the time due to pt just coming off of the machine after being on it for most of last night and the day. RT is going to reassess at 0200 to reevaluate for cont. Bi-PAP.

## 2021-10-12 NOTE — Assessment & Plan Note (Addendum)
See under end-stage COPD.

## 2021-10-12 NOTE — Assessment & Plan Note (Addendum)
Hemoglobin stable for the most part.  No evidence of overt bleeding.

## 2021-10-12 NOTE — Progress Notes (Signed)
[facility] PROGRESS NOTE   [name] [id number] DOB: 18-Apr-1966 DOA: 10/11/2021  1 DOS: the patient was seen and examined on 10/12/2021  PCP: Chesley Noon, MD  Brief History and Hospital Course:  56 y.o. 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 She was last hospitalized from 1/11-14 with acute on chronic respiratory failure from pneumococcal PNA, COPD exacerbation, and acute on chronic diastolic CHF.  Her husband reports that she has end-stage COPD and was doing ok since home from her last hospitalization.  On a good day, she is able to walk maybe 10 feet and most days more like 3-4 feet.  She is able to talk and eat and visit with folks but does get winded doing those activities.  She usually wakes him up to help her go to the bedside commode at night but got up on her own last night and was unable to support herself.  Her O2 was off and she was profoundly hypoxic.  The last time this happened, steroids and antibiotics helped and so he would like to try that again.  However, he does realize that she is terminally ill and agrees that if current therapy isn't working we will transition to comfort care.  She wears nocturnal BIPAP at times at home but not consistently.  She was placed on BiPAP.  Then transition to high flow nasal cannula but then had to be placed back on BiPAP.  Consultants: Hospice  Procedures: None    Subjective: Patient opens her eyes when her name is called but does not really communicate beyond that.  She is on BiPAP.  Does not stay awake.    Assessment/Plan:   * End stage COPD ([place])- (present on admission) -Patient with known h/o end-stage-COPD, on hospice -She was recently admitted with acute on chronic respiratory failure that was multifactorial - pneumococcal PNA, COPD exacerbation, and diastolic -She has been home  for only a few weeks and is back with recurrent acute on chronic respiratory failure -Possibly recurrent PNA, possibly CHF exacerbation, less likely currently related to CHF Patient was placed on BiPAP followed by high flow nasal cannula and then transition back to BiPAP.  Not very responsive this morning. Trying conservative management for now with antibiotics steroids nebulizer treatment to see if she would respond.  If she does not respond then plan would be to transition her to comfort care.  Hospice is aware of admission. Discussed with nursing staff and respiratory therapist.  Acute on chronic respiratory failure with hypoxia and hypercapnia ([place])- (present on admission) See under end-stage COPD.  Remains on BiPAP currently.  Chronic diastolic CHF (congestive heart failure) ([place])- (present on admission) -Preserved EF on recent echo, grade 1 diastolic dysfunction in [id number] -Appears to be euvolemic Furosemide remains on hold.  Essential hypertension- (present on admission) Monitor blood pressures closely.  On as needed hydralazine.  Chronic pain disorder- (present on admission) -Continue home MS Contin, MSIR, Norco, Neurontin, Cymbalta.  Respiratory status has been impeding her ability to take oral medication in the hospital.  Continue to monitor for now.  Sleep apnea- (present on admission) -Inconsistent use of nocturnal BIPAP, may benefit from consistent use  Normocytic anemia- (present on admission) Hemoglobin at baseline.  Continue to monitor.  No evidence of overt bleeding  DNR (do not resuscitate)- (present on admission) Discussed by admitting provider with patient and husband.  Patient would not desire resuscitation and would prefer to die a natural death should that situation arise. -Hospice is also involved  Tobacco abuse- (present on admission) -Encourage cessation.   -Patch ordered     DVT Prophylaxis: Lovenox Code Status: DNR Family Communication: No family at  bedside.  Will update husband later today Disposition Plan: To be determined  Status is: Inpatient Remains inpatient appropriate because: Acute respiratory failure       Medications: Scheduled:  docusate sodium  100 mg Oral BID   DULoxetine  30 mg Oral BID   enoxaparin (LOVENOX) injection  40 mg Subcutaneous Q24H   fluconazole  100 mg Oral Daily   gabapentin  400 mg Oral TID   ipratropium-albuterol  3 mL Nebulization QID   loratadine  10 mg Oral Daily   methylPREDNISolone (SOLU-MEDROL) injection  80 mg Intravenous Q12H   montelukast  10 mg Oral QHS   morphine  30 mg Oral Q12H   nicotine  14 mg Transdermal Daily   pantoprazole  40 mg Oral BID   sodium chloride flush  3 mL Intravenous Q12H   Continuous:  ceFEPime (MAXIPIME) IV 2 g (10/12/21 0547)   [id number] **OR** acetaminophen, albuterol, bisacodyl, guaiFENesin, hydrALAZINE, HYDROcodone-acetaminophen, morphine injection, morphine CONCENTRATE, naLOXone (NARCAN)  injection, ondansetron **OR** ondansetron (ZOFRAN) IV, polyethylene glycol, zolpidem  Antibiotics: Anti-infectives (From admission, onward)    Start     Dose/Rate Route Frequency Ordered Stop   10/11/21 1700  fluconazole (DIFLUCAN) tablet 100 mg        100 mg Oral Daily 10/11/21 1634     10/11/21 1645  ceFEPIme (MAXIPIME) 2 g in sodium chloride 0.9 % 100 mL IVPB        2 g 200 mL/hr over 30 Minutes Intravenous Every 8 hours 10/11/21 1634 10/16/21 1359       Objective:  Vital Signs  Vitals:   10/11/21 2312 10/12/21 0500 10/12/21 0810 10/12/21 1127  BP: 105/76 117/79    Pulse: 95 80 87 100  Resp: 15 16 16 19   Temp:      TempSrc:      SpO2: 99% 99% 98% 100%  Weight:      Height:        Intake/Output Summary (Last 24 hours) at 10/12/2021 1134 Last data filed at 10/11/2021 1745 Gross per 24 hour  Intake 193.44 ml  Output --  Net 193.44 ml   Filed Weights   10/11/21 2311  Weight: 72 kg    General appearance: Somnolent.  On BiPAP. Resp:  Tachypneic.  No use of accessory muscles.  Diminished air entry at the bases.  Fever wheezes. Cardio: S1-S2 is normal regular.  No S3-S4.  No rubs murmurs or bruit GI: Abdomen is soft.  Nontender nondistended.  Bowel sounds are present normal.  No masses organomegaly Extremities: Mild edema Neurologic:  No focal neurological deficits.    Lab Results:  Data Reviewed: I have personally reviewed labs and imaging study reports  CBC: Recent Labs  Lab 10/11/21 1147 10/11/21 1224 10/12/21 0649  WBC 27.7*  --  9.1  NEUTROABS 22.7*  --   --   HGB 10.2* 12.9 8.9*  HCT 36.4 38.0 30.8*  MCV 85.6  --  82.4  PLT 338  --  123456    Basic Metabolic Panel: Recent Labs  Lab 10/11/21 1147 10/11/21 1224 10/12/21 0649  NA 139 136 140  K 3.2* 3.1* 3.7  CL 89*  --  93*  CO2 38*  --  37*  GLUCOSE 149*  --  113*  BUN 8  --  13  CREATININE 0.80  --  0.63  CALCIUM 8.9  --  8.4*    GFR: Estimated Creatinine Clearance: 74.6 mL/min (by C-G formula based on SCr of 0.63 mg/dL).   CBG: Recent Labs  Lab 10/11/21 1129  GLUCAP 146*     Recent Results (from the past 240 hour(s))  Resp Panel by RT-PCR (Flu A&B, Covid) Nasopharyngeal Swab     Status: None   Collection Time: 10/11/21  4:26 PM   Specimen: Nasopharyngeal Swab; Nasopharyngeal(NP) swabs in vial transport medium  Result Value Ref Range Status   SARS Coronavirus 2 by RT PCR NEGATIVE NEGATIVE Final    Comment: (NOTE) SARS-CoV-2 target nucleic acids are NOT DETECTED.  The SARS-CoV-2 RNA is generally detectable in upper respiratory specimens during the acute phase of infection. The lowest concentration of SARS-CoV-2 viral copies this assay can detect is 138 copies/mL. A negative result does not preclude SARS-Cov-2 infection and should not be used as the sole basis for treatment or other patient management decisions. A negative result may occur with  improper specimen collection/handling, submission of specimen other than  nasopharyngeal swab, presence of viral mutation(s) within the areas targeted by this assay, and inadequate number of viral copies(<138 copies/mL). A negative result must be combined with clinical observations, patient history, and epidemiological information. The expected result is Negative.  Fact Sheet for Patients:  EntrepreneurPulse.com.au  Fact Sheet for Healthcare Providers:  IncredibleEmployment.be  This test is no t yet approved or cleared by the [place] FDA and  has been authorized for detection and/or diagnosis of SARS-CoV-2 by FDA under an Emergency Use Authorization (EUA). This EUA will remain  in effect (meaning this test can be used) for the duration of the COVID-19 declaration under Section 564(b)(1) of the Act, 21 U.S.C.section 360bbb-3(b)(1), unless the authorization is terminated  or revoked sooner.       Influenza A by PCR NEGATIVE NEGATIVE Final   Influenza B by PCR NEGATIVE NEGATIVE Final    Comment: (NOTE) The Xpert Xpress SARS-CoV-2/FLU/RSV plus assay is intended as an aid in the diagnosis of influenza from Nasopharyngeal swab specimens and should not be used as a sole basis for treatment. Nasal washings and aspirates are unacceptable for Xpert Xpress SARS-CoV-2/FLU/RSV testing.  Fact Sheet for Patients: EntrepreneurPulse.com.au  Fact Sheet for Healthcare Providers: IncredibleEmployment.be  This test is not yet approved or cleared by the [place] FDA and has been authorized for detection and/or diagnosis of SARS-CoV-2 by FDA under an Emergency Use Authorization (EUA). This EUA will remain in effect (meaning this test can be used) for the duration of the COVID-19 declaration under Section 564(b)(1) of the Act, 21 U.S.C. section 360bbb-3(b)(1), unless the authorization is terminated or revoked.  Performed at [facility], [place] [address], [place], [place] [address]        Radiology Studies: DG Chest Port 1 View  Result Date: 10/11/2021 CLINICAL DATA:  sob EXAM: PORTABLE CHEST 1 VIEW COMPARISON:  09/19/2021. FINDINGS: Bibasilar right greater than left opacities. Possible small right pleural effusion. No visible pneumothorax. Cardiomediastinal silhouette is similar to prior and mildly enlarged. ACDF. IMPRESSION: 1. Bibasilar right greater than left opacities, which could represent atelectasis, aspiration, and/or pneumonia. 2. Possible small right pleural effusion. Electronically Signed   By: Margaretha Sheffield M.D.   On: 10/11/2021 12:40       LOS: 1 day   Bonnielee Haff  [facility] Pager on [email or web address]  10/12/2021, 11:34 AM

## 2021-10-12 NOTE — Progress Notes (Signed)
Patient took off the BIPAP so she can eat her dinner, I put her on 8L , will continue to monitor

## 2021-10-12 NOTE — Plan of Care (Signed)
  Problem: Education: Goal: Knowledge of disease or condition will improve Outcome: Not Progressing Goal: Knowledge of the prescribed therapeutic regimen will improve Outcome: Not Progressing Goal: Individualized Educational Video(s) Outcome: Not Progressing   Problem: Activity: Goal: Ability to tolerate increased activity will improve Outcome: Not Progressing Goal: Will verbalize the importance of balancing activity with adequate rest periods Outcome: Not Progressing   Problem: Respiratory: Goal: Ability to maintain a clear airway will improve Outcome: Not Progressing Goal: Levels of oxygenation will improve Outcome: Not Progressing Goal: Ability to maintain adequate ventilation will improve Outcome: Not Progressing   

## 2021-10-12 NOTE — Progress Notes (Addendum)
[facility]) Hospitalized Hospice Patient  [name] has been an [facility] hospice homecare patient with a terminal hospice diagnosis of COPD since June 2022.    Notified by [facility] Triage of patient's arrival via EMS from home to [facility] ED post status fall for evaluation of possible injury and low oxygen saturations. Husband found patient on floor asleep and initiated EMS for lift assistance. Upon EMS arrival, patient was SOB, diaphoretic/pale and hypoxic at 62% O2 sats with complaints of discomfort. Per Dr. Lyman Speller with [facility] this is a related hospital admission.   Visited Patient at bedside, sleeping with BiPaP, does not appear in distress.  Not visitors present. Spoke with husband by phone.   Patient is appropriate for inpatient due to need for symptom management and IV antibiotics.   VS: 97.9, 117/79, 80, 16, 99% on BiPAP.  I/O: 193.4/not recorded.   Abnormal labs: 10/12/21 06:49 Chloride: 93 (L) CO2: 37 (H) Glucose: 113 (H) Calcium: 8.4 (L) RBC: 3.74 (L) Hemoglobin: 8.9 (L) HCT: 30.8 (L) MCH: 23.8 (L) MCHC: 28.9 (L) RDW: 15.9 (H) 10/11/21 12:24 Calcium Ionized: 1.12 (L) 10/11/21 11:47 Potassium: 3.2 (L) Chloride: 89 (L) CO2: 38 (H) Glucose: 149 (H)  Diagnostics: CXR: IMPRESSION: 1. Bibasilar right greater than left opacities, which could represent atelectasis, aspiration, and/or pneumonia. 2. Possible small right pleural effusion.  IV/PRN medications:  Maxipime 2g IVPB TID, Morphine 2mg  PRN Q2hrs @ 0345  Problem list: * End stage COPD ([place])- (present on admission) -Patient with known h/o end-stage-COPD, on hospice -She was recently admitted with acute on chronic respiratory failure that was multifactorial - pneumococcal PNA, COPD exacerbation, and diastolic -She has been home for only a few weeks and is back with recurrent acute on chronic respiratory failure -Possibly recurrent PNA, possibly CHF exacerbation, less likely currently related to  CHF Patient was placed on BiPAP followed by high flow nasal cannula and then transition back to BiPAP.  Not very responsive this morning. Trying conservative management for now with antibiotics steroids nebulizer treatment to see if she would respond.  If she does not respond then plan would be to transition her to comfort care.  Hospice is aware of admission. Discussed with nursing staff and respiratory therapist.   Acute on chronic respiratory failure with hypoxia and hypercapnia ([place])- (present on admission) See under end-stage COPD.  Remains on BiPAP currently.   Chronic diastolic CHF (congestive heart failure) ([place])- (present on admission) -Preserved EF on recent echo, grade 1 diastolic dysfunction in 123456 -Appears to be euvolemic Furosemide remains on hold.   Essential hypertension- (present on admission) Monitor blood pressures closely.  On as needed hydralazine.   Chronic pain disorder- (present on admission) -Continue home MS Contin, MSIR, Norco, Neurontin, Cymbalta.  Respiratory status has been impeding her ability to take oral medication in the hospital.  Continue to monitor for now.   Sleep apnea- (present on admission) -Inconsistent use of nocturnal BIPAP, may benefit from consistent use   Normocytic anemia- (present on admission) Hemoglobin at baseline.  Continue to monitor.  No evidence of overt bleeding   DNR (do not resuscitate)- (present on admission) Discussed by admitting provider with patient and husband.  Patient would not desire resuscitation and would prefer to die a natural death should that situation arise. -Hospice is also involved   Tobacco abuse- (present on admission) -Encourage cessation.   -Patch ordered   Discharge planning: return home with hospice support when stable for discharge. Family contact: as noted above IDG: updated.  Goals of care: DNR, treat conservatively  with medications and breathing treatments for comfort.   Medication list and  transfer summary placed on shadow chart.   Please use [facility] if ambulance transport is needed.  Please do not hesitate to call with questions.   Thank you,   Farrel Gordon, RN, [facility] Liaison   [phone]

## 2021-10-12 NOTE — Progress Notes (Signed)
Pt was able to open eyes and respond. Pt was asked if she is in pain she did respond non-verbally yes.  This is the first time she has been responsive since arriving to the floor. Pt was given iv pain medication. Will re-evaluate and tx as indicated.

## 2021-10-13 DIAGNOSIS — J449 Chronic obstructive pulmonary disease, unspecified: Secondary | ICD-10-CM | POA: Diagnosis not present

## 2021-10-13 DIAGNOSIS — J9621 Acute and chronic respiratory failure with hypoxia: Secondary | ICD-10-CM | POA: Diagnosis not present

## 2021-10-13 DIAGNOSIS — G894 Chronic pain syndrome: Secondary | ICD-10-CM

## 2021-10-13 DIAGNOSIS — J9622 Acute and chronic respiratory failure with hypercapnia: Secondary | ICD-10-CM | POA: Diagnosis not present

## 2021-10-13 MED ORDER — MOMETASONE FURO-FORMOTEROL FUM 200-5 MCG/ACT IN AERO
2.0000 | INHALATION_SPRAY | Freq: Two times a day (BID) | RESPIRATORY_TRACT | Status: DC
  Administered 2021-10-13 – 2021-10-14 (×3): 2 via RESPIRATORY_TRACT
  Filled 2021-10-13: qty 8.8

## 2021-10-13 NOTE — Progress Notes (Signed)
[facility] PROGRESS NOTE   [name] EML:[id number] DOB: [birth year] DOA: 10/11/2021  2 DOS: the patient was seen and examined on 10/13/2021  PCP: [name], MD  Brief History and Hospital Course:  56 y.o. 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 She was last hospitalized from 1/11-14 with acute on chronic respiratory failure from pneumococcal PNA, COPD exacerbation, and acute on chronic diastolic CHF.  Her husband reports that she has end-stage COPD and was doing ok since home from her last hospitalization.  On a good day, she is able to walk maybe 10 feet and most days more like 3-4 feet.  She is able to talk and eat and visit with folks but does get winded doing those activities.  She usually wakes him up to help her go to the bedside commode at night but got up on her own last night and was unable to support herself.  Her O2 was off and she was profoundly hypoxic.  The last time this happened, steroids and antibiotics helped and so he would like to try that again.  However, he does realize that she is terminally ill and agrees that if current therapy isn't working we will transition to comfort care.  She wears nocturnal BIPAP at times at home but not consistently.  She was placed on BiPAP and hospitalized.  Consultants: Hospice  Procedures: None    Subjective: Patient with eyes open.  Awake.  More communicative today than yesterday.  Mentions that she wants to go home.  States that she is feeling better from a breathing standpoint.     Assessment/Plan:   * End stage COPD (HCC)- (present on admission) -Patient with known h/o end-stage-COPD, on hospice -She was recently admitted with acute on chronic respiratory failure that was multifactorial - pneumococcal PNA, COPD exacerbation, and diastolic -She has been home for only a few weeks and is  back with recurrent acute on chronic respiratory failure -Possibly recurrent PNA, possibly CHF exacerbation, less likely currently related to CHF Patient was placed on BiPAP followed by high flow nasal cannula and then transitioned back to BiPAP.   Trying conservative management for now with antibiotics steroids nebulizer treatment to see if she would respond.  If she does not respond then plan would be to transition her to comfort care.  Hospice is aware of admission. Patient was not very responsive yesterday morning but this morning she is awake.  We will try to take her off of BiPAP and see how she does.  She did tolerate being off of BiPAP yesterday afternoon.  She mentions that she wants to go home. She remains on Solu-Medrol, cefepime and nebulizer treatments. At home she is on systemic steroids prednisone 20 mg daily.  Home medication list does not show any inhaled steroids.  Add inhaled steroids as well.  Acute on chronic respiratory failure with hypoxia and hypercapnia (HCC)- (present on admission) See under end-stage COPD.    Chronic diastolic CHF (congestive heart failure) (HCC)- (present on admission) -Preserved EF on recent echo, grade 1 diastolic dysfunction in 2015 -Appears to be euvolemic Furosemide remains on hold.  Essential hypertension- (present on admission) On as needed hydralazine.  Blood pressure reasonably well controlled.  Occasional low readings noted.  Chronic pain disorder- (present on admission) -Continue home MS Contin, MSIR, Norco, Neurontin, Cymbalta.  Respiratory status has been impeding her ability  to take oral medication in the hospital.  Continue to monitor for now.  Sleep apnea- (present on admission) -Inconsistent use of nocturnal BIPAP, may benefit from consistent use  Normocytic anemia- (present on admission) Hemoglobin stable for the most part.  No evidence of overt bleeding.  DNR (do not resuscitate)- (present on admission) Discussed by  admitting provider with patient and husband.  Patient would not desire resuscitation and would prefer to die a natural death should that situation arise. -Hospice is also involved  Tobacco abuse- (present on admission) -Encourage cessation.   -Patch ordered     DVT Prophylaxis: Lovenox Code Status: DNR Family Communication: No family at bedside.  Attempts to reach husband yesterday were not successful.  We will try again today. Disposition Plan: Hopefully return home when improved  Status is: Inpatient Remains inpatient appropriate because: Acute respiratory failure       Medications: Scheduled:  docusate sodium  100 mg Oral BID   DULoxetine  30 mg Oral BID   enoxaparin (LOVENOX) injection  40 mg Subcutaneous Q24H   fluconazole  100 mg Oral Daily   gabapentin  400 mg Oral TID   ipratropium-albuterol  3 mL Nebulization TID   loratadine  10 mg Oral Daily   methylPREDNISolone (SOLU-MEDROL) injection  80 mg Intravenous Q12H   montelukast  10 mg Oral QHS   morphine  30 mg Oral Q12H   nicotine  14 mg Transdermal Daily   pantoprazole  40 mg Oral BID   sodium chloride flush  3 mL Intravenous Q12H   Continuous:  ceFEPime (MAXIPIME) IV 2 g (10/13/21 0545)   [id number] **OR** acetaminophen, albuterol, bisacodyl, guaiFENesin, hydrALAZINE, HYDROcodone-acetaminophen, morphine injection, morphine CONCENTRATE, naLOXone (NARCAN)  injection, ondansetron **OR** ondansetron (ZOFRAN) IV, polyethylene glycol, zolpidem  Antibiotics: Anti-infectives (From admission, onward)    Start     Dose/Rate Route Frequency Ordered Stop   10/11/21 1700  fluconazole (DIFLUCAN) tablet 100 mg        100 mg Oral Daily 10/11/21 1634     10/11/21 1645  ceFEPIme (MAXIPIME) 2 g in sodium chloride 0.9 % 100 mL IVPB        2 g 200 mL/hr over 30 Minutes Intravenous Every 8 hours 10/11/21 1634 10/16/21 1359       Objective:  Vital Signs  Vitals:   10/13/21 0311 10/13/21 0505 10/13/21 0715 10/13/21  0815  BP: 97/67     Pulse: 83     Resp: 20     Temp: 98 F (36.7 C)     TempSrc: Oral     SpO2: 96%  100% 100%  Weight:  72 kg    Height:        Intake/Output Summary (Last 24 hours) at 10/13/2021 1041 Last data filed at 10/12/2021 2342 Gross per 24 hour  Intake --  Output 400 ml  Net -400 ml    Filed Weights   10/11/21 2311 10/13/21 0505  Weight: 72 kg 72 kg    General appearance: Noted to be on BiPAP but awake.  Communicating well. Resp: Mildly tachypneic.  Diminished air entry at the bases.  Wheezing.  No crackles. Cardio: S1-S2 is normal regular.  No S3-S4.  No rubs murmurs or bruit GI: Abdomen is soft.  Nontender nondistended.  Bowel sounds are present normal.  No masses organomegaly Extremities: No edema.  Moving all 4 extremities Neurologic:   No focal neurological deficits.     Lab Results:  Data Reviewed: I have personally reviewed labs and  imaging study reports  CBC: Recent Labs  Lab 10/11/21 1147 10/11/21 1224 10/12/21 0649  WBC 27.7*  --  9.1  NEUTROABS 22.7*  --   --   HGB 10.2* 12.9 8.9*  HCT 36.4 38.0 30.8*  MCV 85.6  --  82.4  PLT 338  --  256     Basic Metabolic Panel: Recent Labs  Lab 10/11/21 1147 10/11/21 1224 10/12/21 0649  NA 139 136 140  K 3.2* 3.1* 3.7  CL 89*  --  93*  CO2 38*  --  37*  GLUCOSE 149*  --  113*  BUN 8  --  13  CREATININE 0.80  --  0.63  CALCIUM 8.9  --  8.4*     GFR: Estimated Creatinine Clearance: 74.6 mL/min (by C-G formula based on SCr of 0.63 mg/dL).   CBG: Recent Labs  Lab 10/11/21 1129  GLUCAP 146*      Recent Results (from the past 240 hour(s))  Resp Panel by RT-PCR (Flu A&B, Covid) Nasopharyngeal Swab     Status: None   Collection Time: 10/11/21  4:26 PM   Specimen: Nasopharyngeal Swab; Nasopharyngeal(NP) swabs in vial transport medium  Result Value Ref Range Status   SARS Coronavirus 2 by RT PCR NEGATIVE NEGATIVE Final    Comment: (NOTE) SARS-CoV-2 target nucleic acids are NOT  DETECTED.  The SARS-CoV-2 RNA is generally detectable in upper respiratory specimens during the acute phase of infection. The lowest concentration of SARS-CoV-2 viral copies this assay can detect is 138 copies/mL. A negative result does not preclude SARS-Cov-2 infection and should not be used as the sole basis for treatment or other patient management decisions. A negative result may occur with  improper specimen collection/handling, submission of specimen other than nasopharyngeal swab, presence of viral mutation(s) within the areas targeted by this assay, and inadequate number of viral copies(<138 copies/mL). A negative result must be combined with clinical observations, patient history, and epidemiological information. The expected result is Negative.  Fact Sheet for Patients:  EntrepreneurPulse.com.au  Fact Sheet for Healthcare Providers:  IncredibleEmployment.be  This test is no t yet approved or cleared by the [place] FDA and  has been authorized for detection and/or diagnosis of SARS-CoV-2 by FDA under an Emergency Use Authorization (EUA). This EUA will remain  in effect (meaning this test can be used) for the duration of the COVID-19 declaration under Section 564(b)(1) of the Act, 21 U.S.C.section 360bbb-3(b)(1), unless the authorization is terminated  or revoked sooner.       Influenza A by PCR NEGATIVE NEGATIVE Final   Influenza B by PCR NEGATIVE NEGATIVE Final    Comment: (NOTE) The Xpert Xpress SARS-CoV-2/FLU/RSV plus assay is intended as an aid in the diagnosis of influenza from Nasopharyngeal swab specimens and should not be used as a sole basis for treatment. Nasal washings and aspirates are unacceptable for Xpert Xpress SARS-CoV-2/FLU/RSV testing.  Fact Sheet for Patients: EntrepreneurPulse.com.au  Fact Sheet for Healthcare Providers: IncredibleEmployment.be  This test is not yet  approved or cleared by the [place] FDA and has been authorized for detection and/or diagnosis of SARS-CoV-2 by FDA under an Emergency Use Authorization (EUA). This EUA will remain in effect (meaning this test can be used) for the duration of the COVID-19 declaration under Section 564(b)(1) of the Act, 21 U.S.C. section 360bbb-3(b)(1), unless the authorization is terminated or revoked.  Performed at [facility], [place] [address] [place], [place] [address]  Radiology Studies: DG Chest Port 1 View  Result Date: 10/11/2021 CLINICAL DATA:  sob EXAM: PORTABLE CHEST 1 VIEW COMPARISON:  09/19/2021. FINDINGS: Bibasilar right greater than left opacities. Possible small right pleural effusion. No visible pneumothorax. Cardiomediastinal silhouette is similar to prior and mildly enlarged. ACDF. IMPRESSION: 1. Bibasilar right greater than left opacities, which could represent atelectasis, aspiration, and/or pneumonia. 2. Possible small right pleural effusion. Electronically Signed   By: Margaretha Sheffield M.D.   On: 10/11/2021 12:40       LOS: 2 days   [facility] Pager on [email or web address]  10/13/2021, 10:41 AM

## 2021-10-13 NOTE — Progress Notes (Signed)
[facility]) Hospitalized Hospice Patient   [name] has been an [facility] hospice homecare patient with a terminal hospice diagnosis of COPD since June 2022.    Notified by [facility] Triage of patient's arrival via EMS from home to [facility] ED post status fall for evaluation of possible injury and low oxygen saturations. Husband found patient on floor asleep and initiated EMS for lift assistance. Upon EMS arrival, patient was SOB, diaphoretic/pale and hypoxic at 62% O2 sats with complaints of discomfort. Per Dr. Lyman Speller with [facility] this is a related hospital admission.   Visited with patient and husband at the bedside. Patient reports she feels good today and husband agrees. Per patient plan is to discharge tomorrow.   Patient is appropriate for inpatient due to need for symptom management   V/S: 98, 83, 83(ECG Heart Rate), 17, 97, 67, FiO2 48%, SpO2 96%  I/O: No intake reported; 400 ml output  Abnormal Labs pH, Arterial 7.350 - 7.450  7.345 (L) 02/25/21 01:46  pCO2 arterial 32.0 - 48.0 mmHg 83.6 (HH) 02/25/21 01:46  pH, Ven 7.250 - 7.430  7.243 (L) 10/11/21 12:24  pCO2, Ven 44.0 - 60.0 mmHg 103.1 (HH) 10/11/21 12:24  pO2, Ven 32.0 - 45.0 mmHg 86.0 (H) 10/11/21 12:24  TCO2 22 - 32 mmol/L 48 (H) 10/11/21 12:24  Acid-Base Excess 0.0 - 2.0 mmol/L 13.0 (H) 10/11/21 12:24  Bicarbonate 20.0 - 28.0 mmol/L 44.4 (H) 10/11/21 12:24  Chloride 98 - 111 mmol/L 93 (L) 10/12/21 06:49  CO2 22 - 32 mmol/L 37 (H) 10/12/21 06:49  Glucose 70 - 99 mg/dL 113 (H) 10/12/21 06:49  Calcium 8.9 - 10.3 mg/dL 8.4 (L) 10/12/21 06:49  Calcium Ionized 1.15 - 1.40 mmol/L 1.12 (L) 10/11/21 12:24  Magnesium 1.7 - 2.4 mg/dL 2.9 (H) 09/21/21 02:36  Albumin 3.5 - 5.0 g/dL 3.1 (L) 09/19/21 19:45  Total Protein 6.5 - 8.1 g/dL 5.7 (L) 09/19/21 19:45  B Natriuretic Peptide 0.0 - 100.0 pg/mL 905.7 (H) 10/11/21 11:47  Troponin I (High Sensitivity) <18 ng/L 34 (H) 09/19/21 22:00  LDL (calc) 0 - 99 mg/dL 113 (H) 09/06/20 11:37   RBC 3.87 - 5.11 MIL/uL 3.74 (L) 10/12/21 06:49  Hemoglobin 12.0 - 15.0 g/dL 8.9 (L) 10/12/21 06:49  HCT 36.0 - 46.0 % 30.8 (L) 10/12/21 06:49  MCH 26.0 - 34.0 pg 23.8 (L) 10/12/21 06:49  MCHC 30.0 - 36.0 g/dL 28.9 (L) 10/12/21 06:49  RDW 11.5 - 15.5 % 15.9 (H) 10/12/21 06:49  NEUT# 1.7 - 7.7 K/uL 22.7 (H) 10/11/21 11:47  Monocyte # 0.1 - 1.0 K/uL 1.2 (H) 10/11/21 11:47  Eosinophils Absolute 0.0 - 0.5 K/uL 0.6 (H) 10/11/21 11:47  Abs Immature Granulocytes 0.00 - 0.07 K/uL 0.33 (H) 10/11/21 11:47  D-Dimer, Quant 0.00 - 0.50 ug/mL-FEU 1.16 (H) 11/16/20 06:08  Hemoglobin A1C 4.8 - 5.6 % 6.1 (H) 09/07/20 02:35  I-stat hCG, quantitative <5 mIU/mL 21.1 (H) 02/16/21 17:38   No new diagnostics since 2.2.23  IV/PRN Medications: IV Solumedrol 125 mg/2 mL injection 80 mg; IV cefipime 2g, IV morphine 2mg   Problem List:   * End stage COPD ([place])- (present on admission) -Patient with known h/o end-stage-COPD, on hospice -She was recently admitted with acute on chronic respiratory failure that was multifactorial - pneumococcal PNA, COPD exacerbation, and diastolic -She has been home for only a few weeks and is back with recurrent acute on chronic respiratory failure -Possibly recurrent PNA, possibly CHF exacerbation, less likely currently related to CHF Patient was placed  on BiPAP followed by high flow nasal cannula and then transitioned back to BiPAP.   Trying conservative management for now with antibiotics steroids nebulizer treatment to see if she would respond.  If she does not respond then plan would be to transition her to comfort care.  Hospice is aware of admission. Patient was not very responsive yesterday morning but this morning she is awake.  We will try to take her off of BiPAP and see how she does.  She did tolerate being off of BiPAP yesterday afternoon.  She mentions that she wants to go home. She remains on Solu-Medrol, cefepime and nebulizer treatments. At home she is on systemic steroids  prednisone 20 mg daily.  Home medication list does not show any inhaled steroids.  Add inhaled steroids as well.   Acute on chronic respiratory failure with hypoxia and hypercapnia ([place])- (present on admission) See under end-stage COPD.     Chronic diastolic CHF (congestive heart failure) ([place])- (present on admission) -Preserved EF on recent echo, grade 1 diastolic dysfunction in [id number] -Appears to be euvolemic Furosemide remains on hold.   Essential hypertension- (present on admission) On as needed hydralazine.  Blood pressure reasonably well controlled.  Occasional low readings noted.   Chronic pain disorder- (present on admission) -Continue home MS Contin, MSIR, Norco, Neurontin, Cymbalta.  Respiratory status has been impeding her ability to take oral medication in the hospital.  Continue to monitor for now.   Sleep apnea- (present on admission) -Inconsistent use of nocturnal BIPAP, may benefit from consistent use   Normocytic anemia- (present on admission) Hemoglobin stable for the most part.  No evidence of overt bleeding.   DNR (do not resuscitate)- (present on admission) Discussed by admitting provider with patient and husband.  Patient would not desire resuscitation and would prefer to die a natural death should that situation arise. -Hospice is also involved   Tobacco abuse- (present on admission) -Encourage cessation.   -Patch ordered    Discharge planning: return home with hospice support when stable for discharge.  Family contact: Husband updated at bedside  IDG: updated.   Goals of care: DNR, treat conservatively  with medications and breathing treatments for comfort.    Please use [facility] if ambulance transport is needed.  Please call with questions or concerns. Thank you  Roselee Nova, [facility] Liaison [phone]

## 2021-10-14 DIAGNOSIS — J449 Chronic obstructive pulmonary disease, unspecified: Secondary | ICD-10-CM | POA: Diagnosis not present

## 2021-10-14 MED ORDER — CEFDINIR 300 MG PO CAPS: 300.0000 mg | ORAL_CAPSULE | Freq: Two times a day (BID) | ORAL | 0 refills | Status: AC

## 2021-10-14 MED ORDER — PREDNISONE 20 MG PO TABS: 20.0000 mg | ORAL_TABLET | Freq: Every day | ORAL | Status: DC

## 2021-10-14 MED ORDER — MOMETASONE FURO-FORMOTEROL FUM 200-5 MCG/ACT IN AERO: 2.0000 | INHALATION_SPRAY | Freq: Two times a day (BID) | RESPIRATORY_TRACT | Status: DC

## 2021-10-14 MED ORDER — PREDNISONE 20 MG PO TABS: ORAL_TABLET | ORAL | 0 refills | Status: DC

## 2021-10-14 NOTE — TOC Transition Note (Signed)
Transition of Care [facility]) - CM/SW Discharge Note   Patient Details  Name: [name] MRN: [id number] Date of Birth: 10-May-1966  Transition of Care [facility]) CM/SW Contact:  Levada Schilling Phone Number: 10/14/2021, 11:51 AM   Clinical Narrative:    Patient will Discharge To: Home, [address], [place] [address] Anticipated DC Date:10/14/21 Family Notified:yes spouse, Bryelle Spiewak, [phone] Transport OQ:HUTML   Per MD patient ready for DC to Home [phone] [address] , [place], [place] [address]. RN, patient, patient's family notified of DC.  Ambulance transport requested for patient for home.  CSW signing off.  Budd Palmer LCSWA [phone]     Final next level of care: Home w Hospice Care Barriers to Discharge: No Barriers Identified   Patient Goals and CMS Choice        Discharge Placement              Patient chooses bed at:  (Home w/ hospice) Patient to be transferred to facility by: [facility] Name of family member notified: Loyola Mast Patient and family notified of of transfer: 10/14/21  Discharge Plan and Services                                     Social Determinants of Health (SDOH) Interventions     Readmission Risk Interventions Readmission Risk Prevention Plan 02/26/2021  Transportation Screening Complete  PCP or Specialist Appt within 3-5 Days Complete  HRI or Home Care Consult Complete  Social Work Consult for Recovery Care Planning/Counseling Complete  Palliative Care Screening Complete  Medication Review [facility]) Referral to Pharmacy  Some recent data might be hidden

## 2021-10-14 NOTE — Discharge Summary (Signed)
[facility]  Physician Discharge Summary   Patient ID: [name] MRN: [id number] DOB/AGE: 57/07/1966 57 y.o.  Admit date: 10/11/2021 Discharge date: 10/14/2021    PCP: Chesley Noon, MD  DISCHARGE DIAGNOSES:  Principal Problem:   End stage COPD ([place]) Active Problems:   Acute on chronic respiratory failure with hypoxia and hypercapnia (HCC)   Chronic diastolic CHF (congestive heart failure) (HCC)   Essential hypertension   Chronic pain disorder   Sleep apnea   Tobacco abuse   DNR (do not resuscitate)   Normocytic anemia   RECOMMENDATIONS FOR OUTPATIENT FOLLOW UP: Hospice to continue to follow at home   Home Health:None  Equipment/Devices:None   CODE STATUS:DNR   DISCHARGE CONDITION: fair  Diet recommendation: As before  INITIAL HISTORY: 56 y.o. 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 She was last hospitalized from 1/11-14 with acute on chronic respiratory failure from pneumococcal PNA, COPD exacerbation, and acute on chronic diastolic CHF.  Her husband reports that she has end-stage COPD and was doing ok since home from her last hospitalization.  On a good day, she is able to walk maybe 10 feet and most days more like 3-4 feet.  She is able to talk and eat and visit with folks but does get winded doing those activities.  She usually wakes him up to help her go to the bedside commode at night but got up on her own last night and was unable to support herself.  Her O2 was off and she was profoundly hypoxic.  The last time this happened, steroids and antibiotics helped and so he would like to try that again.  However, he does realize that she is terminally ill and agrees that if current therapy isn't working we will transition to comfort care.  She wears nocturnal BIPAP at times at home but not consistently.  She was placed on BiPAP and  hospitalized.  Consultations: None  Procedures: None  HOSPITAL COURSE:   * End stage COPD ([place])- (present on admission) -Patient with known h/o end-stage-COPD, on hospice -She was recently admitted with acute on chronic respiratory failure that was multifactorial - pneumococcal PNA, COPD exacerbation, and diastolic -She has been home for only a few weeks and is back with recurrent acute on chronic respiratory failure -Possibly recurrent PNA, possibly CHF exacerbation, less likely currently related to CHF Patient was placed on BiPAP followed by high flow nasal cannula and then transitioned back to BiPAP.   Patient stabilized on bipap. She became alert. Was able to stay off of it for several hours. Patient wants to go back home. Will discharge on steroid taper and antibiotics. Will also let her go on inhaled steroids.  Acute on chronic respiratory failure with hypoxia and hypercapnia ([place])- (present on admission) See under end-stage COPD.    Chronic diastolic CHF (congestive heart failure) ([place])- (present on admission) -Preserved EF on recent echo, grade 1 diastolic dysfunction in [id number] -Appears to be euvolemic  Essential hypertension- (present on admission) On as needed hydralazine.  Blood pressure reasonably well controlled.  Occasional low readings noted.  Chronic pain disorder- (present on admission) -Continue home MS Contin, MSIR, Norco, Neurontin, Cymbalta.  Sleep apnea- (present on admission) -Inconsistent use of nocturnal BIPAP, may benefit from consistent use. She was counseled.  Normocytic anemia- (present on admission) Hemoglobin stable for the most part.  No evidence of overt bleeding.  DNR (do not resuscitate)- (present on admission) Discussed by admitting provider with patient and husband.  Patient would not desire resuscitation and would prefer to die a natural death should that situation arise. -Hospice is also involved   [place] for discharge today.   PERTINENT  LABS:  The results of significant diagnostics from this hospitalization (including imaging, microbiology, ancillary and laboratory) are listed below for reference.    Microbiology: Recent Results (from the past 240 hour(s))  Resp Panel by RT-PCR (Flu A&B, Covid) Nasopharyngeal Swab     Status: None   Collection Time: 10/11/21  4:26 PM   Specimen: Nasopharyngeal Swab; Nasopharyngeal(NP) swabs in vial transport medium  Result Value Ref Range Status   SARS Coronavirus 2 by RT PCR NEGATIVE NEGATIVE Final    Comment: (NOTE) SARS-CoV-2 target nucleic acids are NOT DETECTED.  The SARS-CoV-2 RNA is generally detectable in upper respiratory specimens during the acute phase of infection. The lowest concentration of SARS-CoV-2 viral copies this assay can detect is 138 copies/mL. A negative result does not preclude SARS-Cov-2 infection and should not be used as the sole basis for treatment or other patient management decisions. A negative result may occur with  improper specimen collection/handling, submission of specimen other than nasopharyngeal swab, presence of viral mutation(s) within the areas targeted by this assay, and inadequate number of viral copies(<138 copies/mL). A negative result must be combined with clinical observations, patient history, and epidemiological information. The expected result is Negative.  Fact Sheet for Patients:  EntrepreneurPulse.com.au  Fact Sheet for Healthcare Providers:  IncredibleEmployment.be  This test is no t yet approved or cleared by the [place] FDA and  has been authorized for detection and/or diagnosis of SARS-CoV-2 by FDA under an Emergency Use Authorization (EUA). This EUA will remain  in effect (meaning this test can be used) for the duration of the COVID-19 declaration under Section 564(b)(1) of the Act, 21 U.S.C.section 360bbb-3(b)(1), unless the authorization is terminated  or revoked sooner.        Influenza A by PCR NEGATIVE NEGATIVE Final   Influenza B by PCR NEGATIVE NEGATIVE Final    Comment: (NOTE) The Xpert Xpress SARS-CoV-2/FLU/RSV plus assay is intended as an aid in the diagnosis of influenza from Nasopharyngeal swab specimens and should not be used as a sole basis for treatment. Nasal washings and aspirates are unacceptable for Xpert Xpress SARS-CoV-2/FLU/RSV testing.  Fact Sheet for Patients: EntrepreneurPulse.com.au  Fact Sheet for Healthcare Providers: IncredibleEmployment.be  This test is not yet approved or cleared by the [place] FDA and has been authorized for detection and/or diagnosis of SARS-CoV-2 by FDA under an Emergency Use Authorization (EUA). This EUA will remain in effect (meaning this test can be used) for the duration of the COVID-19 declaration under Section 564(b)(1) of the Act, 21 U.S.C. section 360bbb-3(b)(1), unless the authorization is terminated or revoked.  Performed at [facility], [place] [address] [place], [place] [address]      Labs:  COVID-19 Labs    Lab Results  Component Value Date   SARSCOV2NAA NEGATIVE 10/11/2021   [place] NEGATIVE 09/19/2021   [place] NEGATIVE 02/25/2021   [place] NEGATIVE 02/16/2021      Basic Metabolic Panel: Recent Labs  Lab 10/11/21 1147 10/11/21 1224 10/12/21 0649  NA 139 136 140  K 3.2* 3.1* 3.7  CL 89*  --  93*  CO2 38*  --  37*  GLUCOSE 149*  --  113*  BUN 8  --  13  CREATININE  0.80  --  0.63  CALCIUM 8.9  --  8.4*   CBC: Recent Labs  Lab 10/11/21 1147 10/11/21 1224 10/12/21 0649  WBC 27.7*  --  9.1  NEUTROABS 22.7*  --   --   HGB 10.2* 12.9 8.9*  HCT 36.4 38.0 30.8*  MCV 85.6  --  82.4  PLT 338  --  256   BNP: BNP (last 3 results) Recent Labs    02/16/21 1715 09/19/21 1945 10/11/21 1147  BNP 86.7 908.7* 905.7*     CBG: Recent Labs  Lab 10/11/21 1129  GLUCAP 146*     IMAGING STUDIES DG  Chest Port 1 View  Result Date: 10/11/2021 CLINICAL DATA:  sob EXAM: PORTABLE CHEST 1 VIEW COMPARISON:  09/19/2021. FINDINGS: Bibasilar right greater than left opacities. Possible small right pleural effusion. No visible pneumothorax. Cardiomediastinal silhouette is similar to prior and mildly enlarged. ACDF. IMPRESSION: 1. Bibasilar right greater than left opacities, which could represent atelectasis, aspiration, and/or pneumonia. 2. Possible small right pleural effusion. Electronically Signed   By: Feliberto Harts M.D.   On: 10/11/2021 12:40   DG Chest Port 1 View  Result Date: 09/19/2021 CLINICAL DATA:  Dyspnea EXAM: PORTABLE CHEST 1 VIEW COMPARISON:  02/25/2021, CT 11/11/2020 FINDINGS: Emphysema and chronic interstitial lung disease. Increased right greater than left basilar airspace opacity. Cardiomegaly. No pneumothorax. IMPRESSION: 1. Underlying emphysema and chronic lung disease. Increased right greater than left basilar airspace opacity concerning for superimposed pneumonia and possible right effusion 2. Cardiomegaly with mild vascular congestion Electronically Signed   By: Jasmine Pang M.D.   On: 09/19/2021 19:39    DISCHARGE EXAMINATION: Vitals:   10/14/21 0505 10/14/21 0705 10/14/21 0816 10/14/21 0900  BP: 106/77   108/79  Pulse: 82     Resp:    18  Temp: 97.7 F (36.5 C)   98 F (36.7 C)  TempSrc: Oral   Oral  SpO2: 100% 100% 100%   Weight:      Height:       Improved air entry bilaterally. Few wheezes S1s2 normal regular. Abdomen is soft, non tender   DISPOSITION: Home  Discharge Instructions     Call MD for:  difficulty breathing, headache or visual disturbances   Complete by: As directed    Call MD for:  extreme fatigue   Complete by: As directed    Call MD for:  persistant dizziness or light-headedness   Complete by: As directed    Call MD for:  persistant nausea and vomiting   Complete by: As directed    Call MD for:  severe uncontrolled pain   Complete by:  As directed    Call MD for:  temperature >100.4   Complete by: As directed    Diet - low sodium heart healthy   Complete by: As directed    Increase activity slowly   Complete by: As directed    Please take your medications as prescribed.   No wound care   Complete by: As directed        Allergies as of 10/14/2021       Reactions   Amoxicillin-pot Clavulanate Itching   Ok with benadryl Did it involve swelling of the face/tongue/throat, SOB, or low BP? No Did it involve sudden or severe rash/hives, skin peeling, or any reaction on the inside of your mouth or nose? No Did you need to seek medical attention at a hospital or doctor's office? No When did it last happen?  6 months If all above answers are NO, may proceed with cephalosporin use.   Erythromycin Rash, Other (See Comments)   Ok with benadryl    Moxifloxacin Itching, Other (See Comments)   Reaction to Avelox - ok with benadryl   Sulfamethoxazole-trimethoprim Itching, Other (See Comments)   Ok with benadryl        Medication List     TAKE these medications    albuterol (2.5 MG/3ML) 0.083% nebulizer solution Commonly known as: PROVENTIL Inhale 3 mLs into the lungs every 4 (four) hours.   cefdinir 300 MG capsule Commonly known as: OMNICEF Take 1 capsule (300 mg total) by mouth 2 (two) times daily for 5 days.   cetirizine 10 MG tablet Commonly known as: ZYRTEC Take 10 mg daily by mouth.   Diflucan 100 MG tablet Generic drug: fluconazole Take 100 mg by mouth daily.   DULoxetine 30 MG capsule Commonly known as: CYMBALTA Take 30 mg by mouth 2 (two) times daily.   fluticasone 50 MCG/ACT nasal spray Commonly known as: FLONASE Place 1 spray into both nostrils daily as needed for rhinitis or allergies.   furosemide 20 MG tablet Commonly known as: Lasix Take 1 tablet (20 mg total) by mouth daily as needed for edema (weight gain of 3lbs in 1 day or 5 lbs in 2 days.).   gabapentin 400 MG  capsule Commonly known as: NEURONTIN Take 400 mg by mouth 3 (three) times daily.   HYDROcodone-acetaminophen 10-325 MG tablet Commonly known as: NORCO Take 1 tablet by mouth every 6 (six) hours as needed (breakthrough pain).   ipratropium-albuterol 0.5-2.5 (3) MG/3ML Soln Commonly known as: DUONEB Take 3 mLs by nebulization 4 (four) times daily.   mometasone-formoterol 200-5 MCG/ACT Aero Commonly known as: DULERA Inhale 2 puffs into the lungs 2 (two) times daily.   montelukast 10 MG tablet Commonly known as: SINGULAIR Take 10 mg at bedtime by mouth.   morphine 30 MG 12 hr tablet Commonly known as: MS CONTIN Take 30 mg by mouth every 12 (twelve) hours.   morphine CONCENTRATE 10 mg / 0.5 ml concentrated solution Take 0.5 mLs by mouth every 4 (four) hours as needed for pain or shortness of breath.   nystatin-triamcinolone ointment Commonly known as: MYCOLOG Apply 1 application topically 2 (two) times daily as needed (rash).   OXYGEN Inhale 7-8 L/min into the lungs See admin instructions.   pantoprazole 40 MG tablet Commonly known as: PROTONIX Take 40 mg by mouth 2 (two) times daily.   polyethylene glycol powder 17 GM/SCOOP powder Commonly known as: GLYCOLAX/MIRALAX Take 17 g by mouth daily as needed for mild constipation.   predniSONE 20 MG tablet Commonly known as: DELTASONE Take 3 tablets once daily for 5 days followed by 2 tablets once daily for 5 days followed by 1 tablet once daily as before. What changed: You were already taking a medication with the same name, and this prescription was added. Make sure you understand how and when to take each.   predniSONE 20 MG tablet Commonly known as: DELTASONE Take 1 tablet (20 mg total) by mouth daily. Resume after you have completed the other prescription for prednisone taper Start taking on: October 24, 2021 What changed:  additional instructions These instructions start on October 24, 2021. If you are unsure what to  do until then, ask your doctor or other care provider.   Santyl ointment Generic drug: collagenase Apply 1 application topically See admin instructions. Apply daily as directed to wound on left  shin   Vitamin D (Ergocalciferol) 1.25 MG (50000 UNIT) Caps capsule Commonly known as: DRISDOL Take 50,000 Units by mouth every Monday.          Follow-up Information     Chesley Noon, MD. Schedule an appointment as soon as possible for a visit.   Specialty: Family Medicine Why: As needed Contact information: [place] [address] [phone]                 TOTAL DISCHARGE TIME: 35 mins  [place] [employer] operational officer on [email or web address]  10/14/2021, 2:15 PM

## 2021-10-14 NOTE — Plan of Care (Signed)

## 2021-10-30 ENCOUNTER — Emergency Department (HOSPITAL_COMMUNITY)

## 2021-10-30 ENCOUNTER — Encounter (HOSPITAL_COMMUNITY): Payer: Self-pay | Admitting: Internal Medicine

## 2021-10-30 ENCOUNTER — Other Ambulatory Visit: Payer: Self-pay

## 2021-10-30 ENCOUNTER — Inpatient Hospital Stay (HOSPITAL_COMMUNITY)
Admission: EM | Admit: 2021-10-30 | Discharge: 2021-11-01 | DRG: 951 | Disposition: A | Discharge status: Hospice | Attending: Internal Medicine | Admitting: Internal Medicine

## 2021-10-30 DIAGNOSIS — Z79899 Other long term (current) drug therapy: Secondary | ICD-10-CM | POA: Diagnosis not present

## 2021-10-30 DIAGNOSIS — R0600 Dyspnea, unspecified: Secondary | ICD-10-CM

## 2021-10-30 DIAGNOSIS — Z66 Do not resuscitate: Secondary | ICD-10-CM

## 2021-10-30 DIAGNOSIS — I5032 Chronic diastolic (congestive) heart failure: Secondary | ICD-10-CM | POA: Diagnosis present

## 2021-10-30 DIAGNOSIS — J9621 Acute and chronic respiratory failure with hypoxia: Secondary | ICD-10-CM | POA: Diagnosis present

## 2021-10-30 DIAGNOSIS — Z96642 Presence of left artificial hip joint: Secondary | ICD-10-CM | POA: Diagnosis present

## 2021-10-30 DIAGNOSIS — I110 Hypertensive heart disease with heart failure: Secondary | ICD-10-CM | POA: Diagnosis present

## 2021-10-30 DIAGNOSIS — Z8616 Personal history of COVID-19: Secondary | ICD-10-CM | POA: Diagnosis not present

## 2021-10-30 DIAGNOSIS — Z825 Family history of asthma and other chronic lower respiratory diseases: Secondary | ICD-10-CM | POA: Diagnosis not present

## 2021-10-30 DIAGNOSIS — K219 Gastro-esophageal reflux disease without esophagitis: Secondary | ICD-10-CM | POA: Diagnosis present

## 2021-10-30 DIAGNOSIS — Z515 Encounter for palliative care: Secondary | ICD-10-CM | POA: Diagnosis present

## 2021-10-30 DIAGNOSIS — Z9981 Dependence on supplemental oxygen: Secondary | ICD-10-CM | POA: Diagnosis not present

## 2021-10-30 DIAGNOSIS — J9622 Acute and chronic respiratory failure with hypercapnia: Secondary | ICD-10-CM | POA: Diagnosis present

## 2021-10-30 DIAGNOSIS — G8929 Other chronic pain: Secondary | ICD-10-CM | POA: Diagnosis present

## 2021-10-30 DIAGNOSIS — F17210 Nicotine dependence, cigarettes, uncomplicated: Secondary | ICD-10-CM | POA: Diagnosis present

## 2021-10-30 DIAGNOSIS — Z801 Family history of malignant neoplasm of trachea, bronchus and lung: Secondary | ICD-10-CM

## 2021-10-30 DIAGNOSIS — J449 Chronic obstructive pulmonary disease, unspecified: Secondary | ICD-10-CM | POA: Diagnosis present

## 2021-10-30 DIAGNOSIS — Z7951 Long term (current) use of inhaled steroids: Secondary | ICD-10-CM

## 2021-10-30 DIAGNOSIS — J9601 Acute respiratory failure with hypoxia: Secondary | ICD-10-CM | POA: Insufficient documentation

## 2021-10-30 LAB — CBC WITH DIFFERENTIAL/PLATELET
Abs Immature Granulocytes: 0.21 10*3/uL — ABNORMAL HIGH (ref 0.00–0.07)
Basophils Absolute: 0 10*3/uL (ref 0.0–0.1)
Basophils Relative: 0 %
Eosinophils Absolute: 0.1 10*3/uL (ref 0.0–0.5)
Eosinophils Relative: 1 %
HCT: 30.2 % — ABNORMAL LOW (ref 36.0–46.0)
Hemoglobin: 8.1 g/dL — ABNORMAL LOW (ref 12.0–15.0)
Immature Granulocytes: 1 %
Lymphocytes Relative: 9 %
Lymphs Abs: 1.6 10*3/uL (ref 0.7–4.0)
MCH: 24 pg — ABNORMAL LOW (ref 26.0–34.0)
MCHC: 26.8 g/dL — ABNORMAL LOW (ref 30.0–36.0)
MCV: 89.3 fL (ref 80.0–100.0)
Monocytes Absolute: 1 10*3/uL (ref 0.1–1.0)
Monocytes Relative: 5 %
Neutro Abs: 15.4 10*3/uL — ABNORMAL HIGH (ref 1.7–7.7)
Neutrophils Relative %: 84 %
Platelets: 246 10*3/uL (ref 150–400)
RBC: 3.38 MIL/uL — ABNORMAL LOW (ref 3.87–5.11)
RDW: 16.2 % — ABNORMAL HIGH (ref 11.5–15.5)
WBC: 18.4 10*3/uL — ABNORMAL HIGH (ref 4.0–10.5)
nRBC: 0 % (ref 0.0–0.2)

## 2021-10-30 LAB — I-STAT ARTERIAL BLOOD GAS, ED
Acid-Base Excess: 22 mmol/L — ABNORMAL HIGH (ref 0.0–2.0)
Bicarbonate: 51.2 mmol/L — ABNORMAL HIGH (ref 20.0–28.0)
Calcium, Ion: 1.15 mmol/L (ref 1.15–1.40)
HCT: 28 % — ABNORMAL LOW (ref 36.0–46.0)
Hemoglobin: 9.5 g/dL — ABNORMAL LOW (ref 12.0–15.0)
O2 Saturation: 94 %
Patient temperature: 98
Potassium: 3.5 mmol/L (ref 3.5–5.1)
Sodium: 137 mmol/L (ref 135–145)
TCO2: >50 mmol/L — ABNORMAL HIGH (ref 22–32)
pCO2 arterial: 91.5 mmHg (ref 32–48)
pH, Arterial: 7.355 (ref 7.35–7.45)
pO2, Arterial: 77 mmHg — ABNORMAL LOW (ref 83–108)

## 2021-10-30 LAB — BASIC METABOLIC PANEL
Anion gap: 8 (ref 5–15)
BUN: 8 mg/dL (ref 6–20)
CO2: 44 mmol/L — ABNORMAL HIGH (ref 22–32)
Calcium: 8.5 mg/dL — ABNORMAL LOW (ref 8.9–10.3)
Chloride: 87 mmol/L — ABNORMAL LOW (ref 98–111)
Creatinine, Ser: 0.56 mg/dL (ref 0.44–1.00)
GFR, Estimated: >60 mL/min (ref >60)
Glucose, Bld: 105 mg/dL — ABNORMAL HIGH (ref 70–99)
Potassium: 3.6 mmol/L (ref 3.5–5.1)
Sodium: 139 mmol/L (ref 135–145)

## 2021-10-30 LAB — RESP PANEL BY RT-PCR (FLU A&B, COVID) ARPGX2
Influenza A by PCR: NEGATIVE
Influenza B by PCR: NEGATIVE
SARS Coronavirus 2 by RT PCR: NEGATIVE

## 2021-10-30 LAB — BRAIN NATRIURETIC PEPTIDE: B Natriuretic Peptide: 1779.3 pg/mL — ABNORMAL HIGH (ref 0.0–100.0)

## 2021-10-30 MED ORDER — MORPHINE BOLUS VIA INFUSION
2.0000 mg | INTRAVENOUS | Status: DC | PRN
  Filled 2021-10-30: qty 2

## 2021-10-30 MED ORDER — ACETAMINOPHEN 325 MG PO TABS: 650.0000 mg | ORAL_TABLET | Freq: Four times a day (QID) | ORAL | Status: DC | PRN

## 2021-10-30 MED ORDER — ONDANSETRON HCL 4 MG/2ML IJ SOLN: 4.0000 mg | Freq: Four times a day (QID) | INTRAMUSCULAR | Status: DC | PRN

## 2021-10-30 MED ORDER — LORAZEPAM 1 MG PO TABS: 1.0000 mg | ORAL_TABLET | ORAL | Status: DC | PRN

## 2021-10-30 MED ORDER — DIPHENHYDRAMINE HCL 50 MG/ML IJ SOLN: 12.5000 mg | INTRAMUSCULAR | Status: DC | PRN

## 2021-10-30 MED ORDER — HALOPERIDOL 0.5 MG PO TABS
0.5000 mg | ORAL_TABLET | ORAL | Status: DC | PRN
  Filled 2021-10-30: qty 1

## 2021-10-30 MED ORDER — GLYCOPYRROLATE 0.2 MG/ML IJ SOLN
0.2000 mg | INTRAMUSCULAR | Status: DC | PRN
  Filled 2021-10-30: qty 1

## 2021-10-30 MED ORDER — GLYCOPYRROLATE 0.2 MG/ML IJ SOLN
0.2000 mg | INTRAMUSCULAR | Status: DC | PRN
Start: 2021-10-30 — End: 2021-11-01
  Filled 2021-10-30: qty 1

## 2021-10-30 MED ORDER — HALOPERIDOL LACTATE 5 MG/ML IJ SOLN
0.5000 mg | INTRAMUSCULAR | Status: DC | PRN
  Administered 2021-10-31: 0.5 mg via INTRAVENOUS
  Filled 2021-10-30: qty 1

## 2021-10-30 MED ORDER — GLYCOPYRROLATE 1 MG PO TABS
1.0000 mg | ORAL_TABLET | ORAL | Status: DC | PRN
  Filled 2021-10-30: qty 1

## 2021-10-30 MED ORDER — HALOPERIDOL LACTATE 2 MG/ML PO CONC
0.5000 mg | ORAL | Status: DC | PRN
  Filled 2021-10-30: qty 0.3

## 2021-10-30 MED ORDER — LORAZEPAM 2 MG/ML PO CONC: 1.0000 mg | ORAL | Status: DC | PRN

## 2021-10-30 MED ORDER — BIOTENE DRY MOUTH MT LIQD: 15.0000 mL | OROMUCOSAL | Status: DC | PRN

## 2021-10-30 MED ORDER — MORPHINE 100MG IN NS 100ML (1MG/ML) PREMIX INFUSION
5.0000 mg/h | INTRAVENOUS | Status: DC
  Administered 2021-10-30 – 2021-11-01 (×3): 5 mg/h via INTRAVENOUS
  Filled 2021-10-30 (×4): qty 100

## 2021-10-30 MED ORDER — ONDANSETRON 4 MG PO TBDP
4.0000 mg | ORAL_TABLET | Freq: Four times a day (QID) | ORAL | Status: DC | PRN
Start: 2021-10-30 — End: 2021-11-01

## 2021-10-30 MED ORDER — POLYVINYL ALCOHOL 1.4 % OP SOLN: 1.0000 [drp] | Freq: Four times a day (QID) | OPHTHALMIC | Status: DC | PRN

## 2021-10-30 MED ORDER — FUROSEMIDE 10 MG/ML IJ SOLN
60.0000 mg | Freq: Once | INTRAMUSCULAR | Status: AC
  Administered 2021-10-30: 60 mg via INTRAVENOUS
  Filled 2021-10-30: qty 6

## 2021-10-30 MED ORDER — ACETAMINOPHEN 650 MG RE SUPP: 650.0000 mg | Freq: Four times a day (QID) | RECTAL | Status: DC | PRN

## 2021-10-30 MED ORDER — LORAZEPAM 2 MG/ML IJ SOLN
1.0000 mg | INTRAMUSCULAR | Status: DC | PRN
  Administered 2021-10-30 – 2021-11-01 (×4): 1 mg via INTRAVENOUS
  Filled 2021-10-30 (×4): qty 1

## 2021-10-30 NOTE — Progress Notes (Signed)
[facility] ED 043 - [facility]) Hospitalized Hospice Patient   Ms. Zeiner has been an [facility] hospice homecare patient with a terminal hospice diagnosis of COPD since June 2022.    Patient husband notified [facility] RN via telephone at 0100 on 10/30/21 that the family had initiated EMS who was currently in the home preparing to transport the patient to [facility] ED for evaluation of respiratory distress. Husband stated that the patient was experiencing restlessness/difficulty breathing and despite multiple doses of Roxanol and Xanax, he was unable to mitigate the symptoms and/or achieve respiratory comfort. Patient is being admitted to [facility] with SOB/end of life care. Per Dr. Lyman Speller, an [facility] physician, this is a related hospital admission.    Checked in with the ED Bedside RN who stated that patient has had some pain management issues this shift and is currently awaiting a [facility] bed assignment. Visited Patient with family at bedside, who was sitting with HOB up on 6L O2 in NAD at this time.  Patient stated she arrived to ED with severely labored breaths and after breathing tx, bipap (which has been discontinued at this time) and pain medication that she is feeling more at ease  yet exhausted. Patient also stated that she wishes to continue with support of hospice in the home once medically stable for discharge. Patient aware that [facility] will continue to follow her daily during this hospitalization.   Patient is appropriate for inpatient status due to the required intensity of service to ensure the patient's safety and the skilled staff needed to manage the patient's presenting symptoms, physical exam findings, and initial radiographic and laboratory data in the context of their chronic comorbidities.    VS: 98.2, 97, 19, 110/80, 100  I/O: 0/3500 (-3500)   Abnormal labs: ABG: 7.355/91.5/77/51.2, CO2 44, BNP 1779.3, WBC 18.4, Hgb 8.1, HCT 30.2, Chl 87, Cal 8.5, Glucose 105, COVID/flu negative Diagnostics:  10/30/21 CXR results: Bibasilar atelectasis.  Increasing vascular congestion with edema. IV/PRN medications:  Morphine 1mg /63ml infusion at rate of 6mg /hr  started at 1407 10/30/21 , Lasix 60mg  IV adm at 0324 10/30/21   Problem list per EPIC MD Notes: -Patient with known End-stage COPD presenting with recurrent acute on chronic hypoxic and hypercapnic respiratory failure -Patient has been on home hospice -She was recently admitted with acute on chronic respiratory failure that was multifactorial with decreasing times between hospitalizations -Her husband recognizes that she is unlikely to have meaningful recovery and that she is suffering and requests end of life care at this time -She was started on BIPAP on presentation but is currently on [place] O2 -Hospice is aware of admission and will follow along during hospitalization -She is admitted to [facility] for comfort care and palliative care consult -Patient may be a candidate for [employer] or other residential hospice, but is more likely to have an in-hospital death -Comfort care order set utilized -Pain control with morphine drip  Discharge planning: Ongoing assessment - During this hospitalization, HLT will continue to educate patient on EOL symptoms, treatments, and EOL comfort care per [facility] MD request to help reduce future repeated hospitalizations. Please use [facility] for all ACC patient transportation needs upon discharge.   Family contact: Spoke to family at bedside.  IDG: ACC team updated. PCP notified of current hospitalization.  Goals of care: DNR, treat conservatively  with medications and breathing treatments for comfort.   Transfer Summary and Medication List given to ED secretary to scan into chart.    Please call with  any hospice related questions/concerns,   Gar Ponto, RN [place] (in [place]) [phone]

## 2021-10-30 NOTE — ED Notes (Signed)
Pt lower feet are swollen +1 dark pink in color and +1 pulses.

## 2021-10-30 NOTE — ED Notes (Signed)
Pt husband and daughter at bedside. Pt is up and alert. Provided pt a beverage.

## 2021-10-30 NOTE — ED Provider Notes (Signed)
[facility] EMERGENCY DEPARTMENT Provider Note   CSN: [id number] Arrival date & time: 10/30/21  0139     History  Chief Complaint  Patient presents with   Respiratory Distress    [name] is a 56 y.o. female who presents emergency department in respiratory distress.  History gathered by EMS at bedside.  Patient has a history of COPD and CHF.  She is a hospice patient.  EMS reports that she is on hospice.  They were called out to the scene for respiratory distress.  She was given magnesium Solu-Medrol and albuterol prior to arrival.  I discussed the history of the patient with her husband [name] on the phone.  He reports that she was hospitalized about 2 weeks ago and since then never really fully regained her breathing function.  He states that for the last 5 days her breathing status has been very poor he has had up her baseline oxygen liters to between 8 and 9 L at rest.  He states that hospice has been coming out of the house every single day to help her with her breathing.  He has been having to give her neb treatments every 4 hours without improvement.  He has noticed that she has had increased swelling in her lower extremities up to her mid thighs.  She has been more lethargic and difficult to arouse for the past 2-3 days Patient's husband states that he does not want aggressive measures for his wife because she is suffering and wants predominantly comfort measures.  Patient is DNR/DNI.  HPI     Home Medications Prior to Admission medications   Medication Sig Start Date End Date Taking? Authorizing Provider  albuterol (PROVENTIL) (2.5 MG/3ML) 0.083% nebulizer solution Inhale 3 mLs into the lungs every 4 (four) hours. 01/30/21   [provider]  cetirizine (ZYRTEC) 10 MG tablet Take 10 mg daily by mouth.    [provider]  DIFLUCAN 100 MG tablet Take 100 mg by mouth daily. 09/12/21   [provider]  DULoxetine (CYMBALTA) 30 MG capsule  Take 30 mg by mouth 2 (two) times daily. 07/12/20   [provider]  fluticasone (FLONASE) 50 MCG/ACT nasal spray Place 1 spray into both nostrils daily as needed for rhinitis or allergies.    [provider]  furosemide (LASIX) 20 MG tablet Take 1 tablet (20 mg total) by mouth daily as needed for edema (weight gain of 3lbs in 1 day or 5 lbs in 2 days.). 07/27/20   Lavina Hamman, MD  gabapentin (NEURONTIN) 400 MG capsule Take 400 mg by mouth 3 (three) times daily. 08/09/20   [provider]  HYDROcodone-acetaminophen (NORCO) 10-325 MG tablet Take 1 tablet by mouth every 6 (six) hours as needed (breakthrough pain). 04/02/19   Swinteck, Aaron Edelman, MD  ipratropium-albuterol (DUONEB) 0.5-2.5 (3) MG/3ML SOLN Take 3 mLs by nebulization 4 (four) times daily. 09/14/21   [provider]  mometasone-formoterol (DULERA) 200-5 MCG/ACT AERO Inhale 2 puffs into the lungs 2 (two) times daily. 10/14/21   Bonnielee Haff, MD  montelukast (SINGULAIR) 10 MG tablet Take 10 mg at bedtime by mouth.    [provider]  morphine (MS CONTIN) 30 MG 12 hr tablet Take 30 mg by mouth every 12 (twelve) hours.    [provider]  Morphine Sulfate (MORPHINE CONCENTRATE) 10 mg / 0.5 ml concentrated solution Take 0.5 mLs by mouth every 4 (four) hours as needed for pain or shortness of breath.  10/06/21   [provider]  nystatin-triamcinolone ointment (MYCOLOG) Apply 1 application topically 2 (two) times daily as needed (rash). 10/16/20   [provider]  OXYGEN Inhale 7-8 L/min into the lungs See admin instructions.    [provider]  pantoprazole (PROTONIX) 40 MG tablet Take 40 mg by mouth 2 (two) times daily. 08/16/20   [provider]  polyethylene glycol powder (GLYCOLAX/MIRALAX) 17 GM/SCOOP powder Take 17 g by mouth daily as needed for mild constipation. 09/04/20   [provider]  predniSONE (DELTASONE) 20 MG tablet Take 3 tablets once daily  for 5 days followed by 2 tablets once daily for 5 days followed by 1 tablet once daily as before. 10/14/21   Bonnielee Haff, MD  predniSONE (DELTASONE) 20 MG tablet Take 1 tablet (20 mg total) by mouth daily. Resume after you have completed the other prescription for prednisone taper 10/24/21   Bonnielee Haff, MD  SANTYL ointment Apply 1 application topically See admin instructions. Apply daily as directed to wound on left shin 02/21/21   [provider]  Vitamin D, Ergocalciferol, (DRISDOL) 1.25 MG (50000 UNIT) CAPS capsule Take 50,000 Units by mouth every Monday. 06/15/20   [provider]      Allergies    Amoxicillin-pot clavulanate, Erythromycin, Moxifloxacin, and Sulfamethoxazole-trimethoprim    Review of Systems   Review of Systems  Physical Exam Updated Vital Signs There were no vitals taken for this visit. Physical Exam Vitals and nursing note reviewed.  Constitutional:      Appearance: She is overweight. She is ill-appearing.     Interventions: Face mask in place.  HENT:     Head: Normocephalic and atraumatic.  Eyes:     Extraocular Movements: Extraocular movements intact.     Comments: Sluggish pupillary response  Neck:     Comments: Unable to assess JVD due to  -body  habitus Cardiovascular:     Rate and Rhythm: Normal rate.  Pulmonary:     Effort: Tachypnea, accessory muscle usage, prolonged expiration and respiratory distress present.     Breath sounds: Decreased air movement present. Decreased breath sounds, wheezing, rhonchi and rales present.  Musculoskeletal:     Cervical back: Normal range of motion and neck supple.     Right lower leg: Edema present.     Left lower leg: Edema present.  Neurological:     Mental Status: She is lethargic.    ED Results / Procedures / Treatments   Labs (all labs ordered are listed, but only abnormal results are displayed) Labs Reviewed  BASIC METABOLIC PANEL  CBC WITH DIFFERENTIAL/PLATELET  BRAIN  NATRIURETIC PEPTIDE  I-STAT ARTERIAL BLOOD GAS, ED    EKG None  Radiology No results found.  Procedures .Critical Care Performed by: Margarita Mail, PA-C Authorized by: Margarita Mail, PA-C   Critical care provider statement:    Critical care time (minutes):  50   Critical care was necessary to treat or prevent imminent or life-threatening deterioration of the following conditions:  Respiratory failure   Critical care was time spent personally by me on the following activities:  Development of treatment plan with patient or surrogate, discussions with consultants, evaluation of patient's response to treatment, examination of patient, ordering and review of laboratory studies, ordering and review of radiographic studies, ordering and performing treatments and interventions, pulse oximetry, re-evaluation of patient's condition and review of old charts    Medications Ordered in ED Medications - No data to display  ED Course/ Medical Decision  Making/ A&P Clinical Course as of 10/30/21 0301  Tue Oct 30, 2021  0144 Bipap ordered [AH]  0144 Spoke with husband [name] By phone [AH]  0201 I reviewed the patient's CXR- BL pulmonary edema- lasix ordered [AH]  0203 ED EKG Sinus tach at a rate of 113 [AH]  0258 WBC(!): 18.4 [AH]  0258 Hemoglobin(!): 8.1 [AH]  0258 pCO2 arterial(!!): 91.5 ABG shows compensated resp acidosis and hypoxemia [AH]  0258 I-Stat arterial blood gas, ED(!!) [AH]  0259 CBC with Differential(!) CBC with leukocytosis-  suspect due to recent steroid use [AH]  [id number] Basic metabolic panel(!) Hypchloremia and elevated bicarb likelydue to chronic hypercapnia [AH]    Clinical Course User Index [AH] Margarita Mail, PA-C                           Medical Decision Making 56 year old female with end-stage COPD and CHF here with acute on chronic respiratory failure.  Patient has been on BiPAP.  Labs reviewed in ED course.  Her husband is at bedside and we had a long  discussion about goals of care.  At this point the patient wishes to make her comfort care only.  He had a long discussion with his daughters who are both in agreement.  He wishes to withdraw BiPAP and other interventions such as antibiotics but does wish to give her pain medication or medicine for agitation.  I have discussed this with Dr. Hal Hope who is aware of the family's wishes and will admit for the hospitalist service.  Amount and/or Complexity of Data Reviewed Labs: ordered. Decision-making details documented in ED Course. Radiology: ordered. ECG/medicine tests:  Decision-making details documented in ED Course. Discussion of management or test interpretation with external provider(s): Dr. Hal Hope [facility] who will admit the patient  Risk Prescription drug management. Decision regarding hospitalization. Decision not to resuscitate or to de-escalate care because of poor prognosis.  Final Clinical Impression(s) / ED Diagnoses Final diagnoses:  None    Rx / DC Orders ED Discharge Orders     None         Margarita Mail, PA-C 10/30/21 [id number]    Orpah Greek, MD 10/31/21 [phone]

## 2021-10-30 NOTE — ED Triage Notes (Signed)
Pt BIB EMS, pt arrived on second duoneb, received 2g of mag, and 125mg  of solumedrol. Hospice pt, COPD and CHF. Pt has severely labored breathing w/ accessory muscle use, hypoxic to 60% on RA. Noted pt also has purple toes on L foot.

## 2021-10-30 NOTE — H&P (Signed)
History and Physical    Patient: [name] [id number] DOB: 1966-05-31 DOA: 10/30/2021 DOS: the patient was seen and examined on 10/30/2021 PCP: Chesley Noon, MD  Patient coming from: Home - lives with husband; NOK: [place], [place], [phone]   Chief Complaint: SOB  HPI: [name] is a 56 y.o. 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 She was hospitalized from 1/11-14 with acute on chronic respiratory failure from pneumococcal PNA, COPD exacerbation, and acute on chronic diastolic CHF.  She was subsequently hospitalized from 2/2-5 with recurrent acute on chronic respiratory failure; she required BIPAP but eventually stabilized and wanted to return home with hospice.  She returned overnight with SOB, placed on BIPAP.  She is currently off BIPAP but has little to say.    I spoke with husband, who is sleeping in the car in the parking lot of the hospital.  She was ok for maybe the first week - but not as well as prior.  She started tapering the prednisone and started going downhill again for the last week.  Her breathing never really got as good as prior to last hospitalization.  She has been tired, sleeping more.  She hasn't been eating much in the last 4-5 days.  He talked with the ER PA last night and he thinks the medicines aren't working anymore.  He thinks she is tired and ready to go.  He talked with his daughters (including one who is a Marine scientist) and they think she is suffering.  They are ready to just make her comfortable.  He thinks dying in the hospital is the best option.  He doesn't want her to continue to suffer.    ER Course:  [place]ry[place], per Dr. Hal Hope:  56 year old female with end-stage COPD presents with worsening shortness of breath.  At this time patient has been has requested comfort measures.  Patient is DNR.     Review  of Systems: unable to review all systems due to the inability of the patient to answer questions. Past Medical History:  Diagnosis Date   Allergic rhinitis    Asthma    CHF (congestive heart failure) (HCC)    Chronic pain    COPD (chronic obstructive pulmonary disease) (HCC)    on home O2   GERD (gastroesophageal reflux disease)    Headache    History of COVID-19    Hypertension    Neck pain    Open wound of left hip    spouse changes dressing q day   Shortness of breath    Sleep apnea    NO DEVICE IN USE, USES SUPPLEMENTAL O2 IN PLACE OF DEVICE    Supplemental oxygen dependent    4L CONTINUOUS    Past Surgical History:  Procedure Laterality Date   COLONOSCOPY     HUMERUS IM NAIL Left 07/23/2017   Procedure: INTRAMEDULLARY (IM) NAIL HUMERAL;  Surgeon: Nicholes Stairs, MD;  Location: [facility];  Service: Orthopedics;  Laterality: Left;   INCISION AND DRAINAGE HIP Left 04/01/2019   Procedure: IRRIGATION AND DEBRIDEMENT HIP;  Surgeon: Rod Can, MD;  Location: [facility];  Service: Orthopedics;  Laterality: Left;   LUMBAR LAMINECTOMY/DECOMPRESSION MICRODISCECTOMY  04/06/2012   Procedure: LUMBAR LAMINECTOMY/DECOMPRESSION MICRODISCECTOMY;  Surgeon: Jessy Oto, MD;  Location: [facility];  Service: Orthopedics;  Laterality: N/A;  Left L3-4 and L4-5 lateral recess decompression  MIS   NECK SURGERY  Sept 2005   TOTAL ABDOMINAL HYSTERECTOMY  Oct 2003   partial   TOTAL HIP ARTHROPLASTY Left 02/18/2019   Procedure: TOTAL HIP ARTHROPLASTY ANTERIOR APPROACH;  Surgeon: Rod Can, MD;  Location: [facility];  Service: Orthopedics;  Laterality: Left;   TUBAL LIGATION  1995   Social History:  reports that she has been smoking cigarettes. She has a 15.00 pack-year smoking history. She has never used smokeless tobacco. She reports that she does not currently use alcohol. She reports that she does not use drugs.  Allergies  Allergen Reactions   Amoxicillin-Pot Clavulanate Itching    Ok with  benadryl Did it involve swelling of the face/tongue/throat, SOB, or low BP? No Did it involve sudden or severe rash/hives, skin peeling, or any reaction on the inside of your mouth or nose? No Did you need to seek medical attention at a hospital or doctor's office? No When did it last happen?      6 months If all above answers are NO, may proceed with cephalosporin use.    Erythromycin Rash and Other (See Comments)    Ok with benadryl    Moxifloxacin Itching and Other (See Comments)    Reaction to Avelox - ok with benadryl   Sulfamethoxazole-Trimethoprim Itching and Other (See Comments)    Ok with benadryl    Family History  Problem Relation Age of Onset   Emphysema Father    Rheum arthritis Father    Lung cancer Father    Emphysema Mother     Prior to Admission medications   Medication Sig Start Date End Date Taking? Authorizing Provider  albuterol (PROVENTIL) (2.5 MG/3ML) 0.083% nebulizer solution Inhale 3 mLs into the lungs every 4 (four) hours. 01/30/21  Yes [provider]  cetirizine (ZYRTEC) 10 MG tablet Take 10 mg daily by mouth.   Yes [provider]  DIFLUCAN 100 MG tablet Take 100 mg by mouth daily as needed (yeast infection). 09/12/21  Yes [provider]  DULoxetine (CYMBALTA) 30 MG capsule Take 30 mg by mouth 2 (two) times daily. 07/12/20  Yes [provider]  fluticasone (FLONASE) 50 MCG/ACT nasal spray Place 1 spray into both nostrils daily as needed for rhinitis or allergies.   Yes [provider]  furosemide (LASIX) 20 MG tablet Take 1 tablet (20 mg total) by mouth daily as needed for edema (weight gain of 3lbs in 1 day or 5 lbs in 2 days.). 07/27/20  Yes Lavina Hamman, MD  gabapentin (NEURONTIN) 400 MG capsule Take 400 mg by mouth 3 (three) times daily. 08/09/20  Yes [provider]  haloperidol (HALDOL) 5 MG tablet Take 5 mg by mouth every 4 (four) hours as needed for agitation. 10/23/21  Yes [provider]  HYDROcodone-acetaminophen (NORCO) 10-325 MG tablet Take 1 tablet by mouth every 6 (six) hours as needed (breakthrough pain). 04/02/19  Yes Swinteck, Aaron Edelman, MD  ipratropium-albuterol (DUONEB) 0.5-2.5 (3) MG/3ML SOLN Take 3 mLs by nebulization 4 (four) times daily. 09/14/21  Yes [provider]  mometasone-formoterol (DULERA) 200-5 MCG/ACT AERO Inhale 2 puffs into the lungs 2 (two) times daily. 10/14/21  Yes Bonnielee Haff, MD  montelukast (SINGULAIR) 10 MG tablet Take 10 mg at bedtime by mouth.   Yes [provider]  morphine (MS CONTIN) 30 MG 12 hr tablet Take 30 mg by mouth every 12 (twelve) hours.   Yes [provider]  Morphine Sulfate (MORPHINE CONCENTRATE) 10 mg / 0.5  ml concentrated solution Take 0.5 mLs by mouth every 4 (four) hours as needed for pain or shortness of breath. 10/06/21  Yes [provider]  nystatin-triamcinolone ointment (MYCOLOG) Apply 1 application topically 2 (two) times daily as needed (rash). 10/16/20  Yes [provider]  OXYGEN Inhale 8-9 L/min into the lungs continuous.   Yes [provider]  pantoprazole (PROTONIX) 40 MG tablet Take 40 mg by mouth 2 (two) times daily. 08/16/20  Yes [provider]  polyethylene glycol powder (GLYCOLAX/MIRALAX) 17 GM/SCOOP powder Take 17 g by mouth daily as needed for mild constipation. 09/04/20  Yes [provider]  predniSONE (DELTASONE) 20 MG tablet Take 1 tablet (20 mg total) by mouth daily. Resume after you have completed the other prescription for prednisone taper 10/24/21  Yes Bonnielee Haff, MD  Vitamin D, Ergocalciferol, (DRISDOL) 1.25 MG (50000 UNIT) CAPS capsule Take 50,000 Units by mouth every Monday. 06/15/20  Yes [provider]  predniSONE (DELTASONE) 20 MG tablet Take 3 tablets once daily for 5 days followed by 2 tablets once daily for 5 days followed by 1 tablet once daily as before. Patient not taking: Reported on 10/30/2021 10/14/21   Bonnielee Haff, MD  SANTYL ointment Apply 1 application topically See admin instructions. Apply daily as directed to wound on left shin Patient not taking: Reported on 10/30/2021 02/21/21   [provider]    Physical Exam: Vitals:   10/30/21 0345 10/30/21 0730 10/30/21 1000 10/30/21 1045  BP: 90/64 104/61    Pulse: 92 96 93 91  Resp: 16 15 17 20   SpO2: 97% 99% 100% 100%   General:  Appears very ill and somnolent, on [place] O2 Eyes:  normal lids ENT:  dry lips & tongue, moderately dry mm Neck:  no LAD, masses or thyromegaly Cardiovascular:  RRR, no m/r/g. No LE edema.  Respiratory:   Diffuse wheezes with scattered rhonchi.  Mildly increased respiratory effort. Abdomen:  soft, NT, ND Skin:  no rash or induration seen on limited exam Musculoskeletal:  decreased tone BUE/BLE, no bony abnormality Psychiatric:  somnolent mood and affect, speech limited Neurologic:  unable to effectively   Radiological Exams on Admission: Independently reviewed - see discussion in A/P where applicable  DG Chest Port 1 View  Result Date: 10/30/2021 CLINICAL DATA:  Respiratory distress EXAM: PORTABLE CHEST 1 VIEW COMPARISON:  10/11/2021 FINDINGS: Cardiac shadow is prominent but stable. Tortuous thoracic aorta is noted. Increased vascular congestion is noted with mild interstitial edema. Bibasilar atelectasis is noted increased from the prior exam. IMPRESSION: Bibasilar atelectasis.  Increasing vascular congestion with edema. Electronically Signed   By: Inez Catalina M.D.   On: 10/30/2021 02:14    EKG: Independently reviewed.  Sinus tachycardia with rate 113; PVCs, nonspecific ST changes with no evidence of acute ischemia   Labs on Admission: I have personally reviewed the available labs and imaging studies at the time of the admission.  Pertinent labs:    ABG: 7.355/91.5/77/51.2 CO2 44 BNP 1779.3 WBC 18.4 Hgb 8.1 COVID/flu negative    Assessment and Plan: * Admission for end of life care -Patient  with known End-stage COPD presenting with recurrent acute on chronic hypoxic and hypercapnic respiratory failure -Patient has been on home hospice -She was recently admitted with acute on chronic respiratory failure that was multifactorial with decreasing times between hospitalizations -Her husband recognizes that she is unlikely to have meaningful recovery and that she is suffering and requests end of life care at this time -  She was started on BIPAP on presentation but is currently on [place] O2 -Hospice is aware of admission and will follow along during hospitalization -She is admitted to [facility] for comfort care and palliative care consult -Patient may be a candidate for [facility] or other residential hospice, but is more likely to have an in-hospital death -Comfort care order set utilized -Pain control with morphine drip      Advance Care Planning:   Code Status: DNR   Consults: Palliative care  DVT Prophylaxis: None  Family Communication: I spoke with her husband by telephone at the time of admission  Severity of Illness: The appropriate patient status for this patient is INPATIENT. Inpatient status is judged to be reasonable and necessary in order to provide the required intensity of service to ensure the patient's safety. The patient's presenting symptoms, physical exam findings, and initial radiographic and laboratory data in the context of their chronic comorbidities is felt to place them at high risk for further clinical deterioration. Furthermore, it is not anticipated that the patient will be medically stable for discharge from the hospital within 2 midnights of admission.   * I certify that at the point of admission it is my clinical judgment that the patient will require inpatient hospital care spanning beyond 2 midnights from the point of admission due to high intensity of service, high risk for further deterioration and high frequency of surveillance  required.*  Author: Karmen Bongo, MD 10/30/2021 10:50 AM  For on call review [email or web address].

## 2021-10-30 NOTE — ED Notes (Signed)
Palliative Provider at the bedside

## 2021-10-30 NOTE — Assessment & Plan Note (Signed)
-  Patient with known End-stage COPD presenting with recurrent acute on chronic hypoxic and hypercapnic respiratory failure -Patient has been on home hospice -She was recently admitted with acute on chronic respiratory failure that was multifactorial with decreasing times between hospitalizations -Her husband recognizes that she is unlikely to have meaningful recovery and that she is suffering and requests end of life care at this time -She was started on BIPAP on presentation but is currently on [place] O2 -Hospice is aware of admission and will follow along during hospitalization -She is admitted to [facility] for comfort care and palliative care consult -Patient may be a candidate for [facility] or other residential hospice, but is more likely to have an in-hospital death -Comfort care order set utilized -Pain control with morphine drip

## 2021-10-30 NOTE — ED Notes (Signed)
Placed an order for lunch  and provided a pillow

## 2021-10-30 NOTE — ED Notes (Signed)
Pt eating lunch tray at the bedside with family

## 2021-10-30 NOTE — ED Notes (Signed)
Notified MD about pt back pain. Pt requesting pain medication.

## 2021-10-30 NOTE — ED Notes (Signed)
RN called spouse Simona Huh to inform him pt has been moved to a new room and he can come in if he would like to.

## 2021-10-30 NOTE — ED Notes (Signed)
Dinner Tray has been ordered

## 2021-10-30 NOTE — Consult Note (Signed)
Consultation Note Date: 10/30/2021   Patient Name: [name]  DOB: Dec 08, 1965  MRN: [id number]  Age / Sex: 56 y.o., female  PCP: Nichole Noon, MD Referring Physician: Karmen Bongo, MD  Reason for Consultation: symptom management, end of life care  HPI/Patient Profile: 56 y.o. female  with past medical history of end-stage COPD, chronic respiratory failure on 7L home O2, chronic diastolic CHF, chronic back pain, and HTN presenting to the emergency department on 10/30/2021 with shortness of breath. She was hospitalized 1/11-1/14 with acute on chronic respiratory failure for pneumonia, COPD exacerbation, and acute on chronic diastolic CHF.  She was subsequently hospitalized from 2/2-2/5 with recurrent acute on chronic respiratory failure. She required BiPAP but eventually stabilized and wanted to return home with hospice.  Patient has been on home hospice with [name] since June 2022.    Clinical Assessment and Goals of Care: I have reviewed medical records including EPIC notes, labs and imaging. Admitting physician Dr. Lorin Morrison has already spoken with patient's husband; he expressed wanting to keep her comfortable. She has been admitted for end of life care. EOL order set has been utilized.   I went to see patient  at bedside in the ED. She is on a morphine infusion at 5 mg/hr. Oxygen at 6L. She is alert and oriented. Reports her shortness of breath is much improved. Her husband and daughter/[name] are at bedside.   Patient is known to [name] from a previous hospitalization, I re-introduced Palliative Medicine as specialized medical care for people living with serious illness. It focuses on providing relief from the symptoms and stress of a serious illness.   We discussed her current illness and what it means in the larger context of her ongoing co-morbidities.  Natural trajectory at EOL was  discussed.  Patient and family confirm that the goal of care is comfort. Reviewed this means allowing a natural course to occur, with the goal of comfort and dignity rather than prolonging life.  Discussed specifics of comfort care provided in the hospital--keeping her clean and dry, no labs, no artificial hydration or feeding, no antibiotics, minimizing of medications, and medication for pain and dyspnea. Provided education on the indications for opioids in managing dyspnea at EOL.   Patient expresses that she wants to go home. However, husband calls me later and shares that he may not be able to care for her at home. He is also concerned that she will end up right back in the hospital. Discussed waiting 24-48 hours to get a clearer picture of what her prognosis is and ensure that her symptoms are well managed.   Primary decision maker: Patient with support from husband    SUMMARY OF RECOMMENDATIONS   Continue comfort measures Continue morphine infusion Administer bolus doses of morphine as needed for uncontrolled pain, shortness of breath, or respiratory rate greater than 25 [name] will continue to follow  Symptom Management:  Lorazepam (ATIVAN) prn for anxiety Haloperidol (HALDOL) prn for agitation  Glycopyrrolate (ROBINUL) for excessive secretions Ondansetron (ZOFRAN) prn  for nausea Polyvinyl alcohol (LIQUIFILM TEARS) prn for dry eyes Antiseptic oral rinse (BIOTENE) prn for dry mouth  Code Status/Advance Care Planning: DNR   Prognosis:  < 2 weeks  Discharge Planning: To Be Determined      Primary Diagnoses: Present on Admission: **None**   I have reviewed the medical record, interviewed the patient and family, and examined the patient. The following aspects are pertinent.  Past Medical History:  Diagnosis Date   Allergic rhinitis    Asthma    CHF (congestive heart failure) (HCC)    Chronic pain    COPD (chronic obstructive pulmonary disease) (HCC)    on home O2    GERD (gastroesophageal reflux disease)    Headache    History of COVID-19    Hypertension    Neck pain    Open wound of left hip    spouse changes dressing q day   Shortness of breath    Sleep apnea    NO DEVICE IN USE, USES SUPPLEMENTAL O2 IN PLACE OF DEVICE    Supplemental oxygen dependent    4L CONTINUOUS     Family History  Problem Relation Age of Onset   Emphysema Father    Rheum arthritis Father    Lung cancer Father    Emphysema Mother    Scheduled Meds: Continuous Infusions:  morphine 5 mg/hr (10/30/21 1008)   PRN Meds:.acetaminophen **OR** acetaminophen, antiseptic oral rinse, diphenhydrAMINE, glycopyrrolate **OR** glycopyrrolate **OR** glycopyrrolate, haloperidol **OR** haloperidol **OR** haloperidol lactate, LORazepam **OR** LORazepam **OR** LORazepam, morphine, ondansetron **OR** ondansetron (ZOFRAN) IV, polyvinyl alcohol   Allergies  Allergen Reactions   Amoxicillin-Pot Clavulanate Itching    Ok with benadryl Did it involve swelling of the face/tongue/throat, SOB, or low BP? No Did it involve sudden or severe rash/hives, skin peeling, or any reaction on the inside of your mouth or nose? No Did you need to seek medical attention at a hospital or doctor's office? No When did it last happen?      6 months If all above answers are NO, may proceed with cephalosporin use.    Erythromycin Rash and Other (See Comments)    Ok with benadryl    Moxifloxacin Itching and Other (See Comments)    Reaction to Avelox - ok with benadryl   Sulfamethoxazole-Trimethoprim Itching and Other (See Comments)    Ok with benadryl   Review of Systems  Neurological:  Positive for weakness.   Physical Exam Constitutional:      General: She is not in acute distress.    Appearance: She is ill-appearing.  Pulmonary:     Effort: Pulmonary effort is normal.  Neurological:     Mental Status: She is alert and oriented to person, place, and time.     Motor: Weakness present.     Vital Signs: BP 104/61    Pulse 91    Resp 20    SpO2 100%      Pain Score: Asleep   SpO2: SpO2: 100 % O2 Device:SpO2: 100 % O2 Flow Rate: .O2 Flow Rate (L/min): 6 L/min   Palliative Assessment/Data: PPS 20-30%    MDM - High   Signed by: Nichole Bullion, NP   Please contact Palliative Medicine Team phone at [phone] for questions and concerns.  For individual provider: See Shea Evans

## 2021-10-31 DIAGNOSIS — Z515 Encounter for palliative care: Principal | ICD-10-CM

## 2021-10-31 NOTE — Progress Notes (Signed)
PROGRESS NOTE    [name]  [id number] DOB: [birth year] DOA: 10/30/2021 PCP: Chesley Noon, MD   Brief Narrative:  [name] is a 56 y.o. female unfortunately well-known to the hospital system with multiple hospitalizations over the past few weeks with medical history significant of  end-stage COPD on hospice, on 7L home O2 and nightly BIPAP; chronic diastolic CHF; chronic pain; and HTN presenting with SOB.   Patient hospitalized early January for acute on chronic respiratory failure, again early February for similar episode following with hospice requesting discharge home to continue with hospice at home.  Unfortunately she represented again overnight 2/20-21 with similar episode of respiratory distress in the setting of end-stage COPD on hospice.  Patient was admitted for end-of-life care and further discussion for goals of care with likely transition to hospice house or for in-hospital demise pending clinical course.  Assessment & Plan:   Principal Problem:   Admission for end of life care  Goals of care, end-of-life care -Patient being managed by outpatient hospice, multiple admissions for acute on chronic respiratory failure in the setting of end-stage COPD.  Patient has had multiple admissions for acute hypoxia and respiratory distress requiring BiPAP treatment and anxiolytics. -Continue morphine drip, continue to titrate for anxiolytics and respiratory distress - lengthy discussion today with daughter at bedside about goals of care -Likely transition to hospice house in the next 24 to 48 hours if this can be arranged. -Unfortunately patient has voiced previously she would like to die at home, while we would like to honor these wishes given patient's multiple admissions over the past few weeks it seems that management at home is currently not appropriate.  Husband is indicated he cannot take care of the patient at home any longer -Continue to titrate oxygen, morphine,  Haldol, lorazepam as indicated  DVT prophylaxis: None, continue comfort measures Code Status: DNR, comfort measures Family Communication: Daughter at bedside  Status is: Marked inpatient  Dispo: The patient is from: Home with hospice              Anticipated d/c is to: Hospice house              Anticipated d/c date is: 24 to 48 hours              Patient currently is medically stable for discharge  Consultants:  Hospice/palliative  Procedures:  None  Antimicrobials:  None  Subjective: No acute issues or events overnight  Objective: Vitals:   10/31/21 0600 10/31/21 0615 10/31/21 0630 10/31/21 1109  BP: (!) 80/56 (!) 84/65 (!) 89/71 (!) 82/60  Pulse: 94 94 94 89  Resp: 13 12 13 13   Temp:      TempSrc:      SpO2: 100% 100% 100% 100%  Weight:      Height:       No intake or output data in the 24 hours ending 10/31/21 1552 Filed Weights   10/30/21 1409  Weight: 74.8 kg    Examination:  General: Sting comfortably, no acute distress. HEENT: Cephalic atraumatic Lungs: Diminished breath sounds bilaterally. Heart: Tachycardic Abdomen:  Soft, nontender, nondistended.  Data Reviewed: I have personally reviewed following labs and imaging studies  CBC: Recent Labs  Lab 10/30/21 0206 10/30/21 0213  WBC  --  18.4*  NEUTROABS  --  15.4*  HGB 9.5* 8.1*  HCT 28.0* 30.2*  MCV  --  89.3  PLT  --  0000000   Basic Metabolic Panel: Recent  Labs  Lab 10/30/21 0206 10/30/21 0213  NA 137 139  K 3.5 3.6  CL  --  87*  CO2  --  44*  GLUCOSE  --  105*  BUN  --  8  CREATININE  --  0.56  CALCIUM  --  8.5*   GFR: Estimated Creatinine Clearance: 76.1 mL/min (by C-G formula based on SCr of 0.56 mg/dL). Liver Function Tests: No results for input(s): AST, ALT, ALKPHOS, BILITOT, PROT, ALBUMIN in the last 168 hours. No results for input(s): LIPASE, AMYLASE in the last 168 hours. No results for input(s): AMMONIA in the last 168 hours. Coagulation Profile: No results for  input(s): INR, PROTIME in the last 168 hours. Cardiac Enzymes: No results for input(s): CKTOTAL, CKMB, CKMBINDEX, TROPONINI in the last 168 hours. BNP (last 3 results) No results for input(s): PROBNP in the last 8760 hours. HbA1C: No results for input(s): HGBA1C in the last 72 hours. CBG: No results for input(s): GLUCAP in the last 168 hours. Lipid Profile: No results for input(s): CHOL, HDL, LDLCALC, TRIG, CHOLHDL, LDLDIRECT in the last 72 hours. Thyroid Function Tests: No results for input(s): TSH, T4TOTAL, FREET4, T3FREE, THYROIDAB in the last 72 hours. Anemia Panel: No results for input(s): VITAMINB12, FOLATE, FERRITIN, TIBC, IRON, RETICCTPCT in the last 72 hours. Sepsis Labs: No results for input(s): PROCALCITON, LATICACIDVEN in the last 168 hours.  Recent Results (from the past 240 hour(s))  Resp Panel by RT-PCR (Flu A&B, Covid) Nasopharyngeal Swab     Status: None   Collection Time: 10/30/21  2:13 AM   Specimen: Nasopharyngeal Swab; Nasopharyngeal(NP) swabs in vial transport medium  Result Value Ref Range Status   SARS Coronavirus 2 by RT PCR NEGATIVE NEGATIVE Final    Comment: (NOTE) SARS-CoV-2 target nucleic acids are NOT DETECTED.  The SARS-CoV-2 RNA is generally detectable in upper respiratory specimens during the acute phase of infection. The lowest concentration of SARS-CoV-2 viral copies this assay can detect is 138 copies/mL. A negative result does not preclude SARS-Cov-2 infection and should not be used as the sole basis for treatment or other patient management decisions. A negative result may occur with  improper specimen collection/handling, submission of specimen other than nasopharyngeal swab, presence of viral mutation(s) within the areas targeted by this assay, and inadequate number of viral copies(<138 copies/mL). A negative result must be combined with clinical observations, patient history, and epidemiological information. The expected result is  Negative.  Fact Sheet for Patients:  EntrepreneurPulse.com.au  Fact Sheet for Healthcare Providers:  IncredibleEmployment.be  This test is no t yet approved or cleared by the [place] FDA and  has been authorized for detection and/or diagnosis of SARS-CoV-2 by FDA under an Emergency Use Authorization (EUA). This EUA will remain  in effect (meaning this test can be used) for the duration of the COVID-19 declaration under Section 564(b)(1) of the Act, 21 U.S.C.section 360bbb-3(b)(1), unless the authorization is terminated  or revoked sooner.       Influenza A by PCR NEGATIVE NEGATIVE Final   Influenza B by PCR NEGATIVE NEGATIVE Final    Comment: (NOTE) The Xpert Xpress SARS-CoV-2/FLU/RSV plus assay is intended as an aid in the diagnosis of influenza from Nasopharyngeal swab specimens and should not be used as a sole basis for treatment. Nasal washings and aspirates are unacceptable for Xpert Xpress SARS-CoV-2/FLU/RSV testing.  Fact Sheet for Patients: EntrepreneurPulse.com.au  Fact Sheet for Healthcare Providers: IncredibleEmployment.be  This test is not yet approved or cleared by the [place]  FDA and has been authorized for detection and/or diagnosis of SARS-CoV-2 by FDA under an Emergency Use Authorization (EUA). This EUA will remain in effect (meaning this test can be used) for the duration of the COVID-19 declaration under Section 564(b)(1) of the Act, 21 U.S.C. section 360bbb-3(b)(1), unless the authorization is terminated or revoked.  Performed at [facility], [place] [address] [place], [place] [address]     Radiology Studies: DG Chest Port 1 View  Result Date: 10/30/2021 CLINICAL DATA:  Respiratory distress EXAM: PORTABLE CHEST 1 VIEW COMPARISON:  10/11/2021 FINDINGS: Cardiac shadow is prominent but stable. Tortuous thoracic aorta is noted. Increased vascular congestion is noted  with mild interstitial edema. Bibasilar atelectasis is noted increased from the prior exam. IMPRESSION: Bibasilar atelectasis.  Increasing vascular congestion with edema. Electronically Signed   By: Inez Catalina M.D.   On: 10/30/2021 02:14    Scheduled Meds: Continuous Infusions:  morphine 5 mg/hr (10/31/21 0436)    LOS: 1 day   Time spent: 64min  Nichole Venturini C Liana Camerer, DO [facility]  If 7PM-7AM, please contact night-coverage [email or web address]  10/31/2021, 3:52 PM

## 2021-10-31 NOTE — Progress Notes (Signed)
Daily Progress Note   Patient Name: [name]       Date: 10/31/2021 DOB: 27-Oct-1965  Age: 56 y.o. MRN#: [id number] Attending Physician: Little Ishikawa, MD Primary Care Physician: Chesley Noon, MD Admit Date: 10/30/2021  Reason for Consultation: symptom management, end of life care   HPI/Patient Profile: 56 y.o. female  with past medical history of end-stage COPD, chronic respiratory failure on 7L home O2, chronic diastolic CHF, chronic back pain, and HTN presenting to the emergency department on 10/30/2021 with shortness of breath. She was hospitalized 1/11-1/14 with acute on chronic respiratory failure for pneumonia, COPD exacerbation, and acute on chronic diastolic CHF.  She was subsequently hospitalized from 2/2-2/5 with recurrent acute on chronic respiratory failure. She required BiPAP but eventually stabilized and wanted to return home with hospice.   Patient has been on home hospice with [facility] since June 2022.    Subjective: Patient appears comfortable. She is sleeping and I did not attempt to wake her. Morphine infusion at 5 mg/hr. No non-verbal signs of pain or discomfort noted. Respirations are even and unlabored. No excessive respiratory secretions noted.   Daughter/[name] is present at bedside. She reports her mother has been mostly sleeping since yesterday evening, waking only briefly. She also reports minimal PO intake. Education and counseling provided on natural trajectory at EOL. Emotional support provided.    Discussed option to transfer to residential hospice facility for a more peaceful setting at EOL. Otila Kluver states she and her dad would be agreeable to this.    Objective:  Physical Exam Vitals reviewed.  Constitutional:      General: She is  sleeping. She is not in acute distress.    Appearance: She is ill-appearing.  Pulmonary:     Effort: Pulmonary effort is normal.            Vital Signs: BP (!) 82/60 (BP Location: Left Arm)    Pulse 89    Temp 98.2 F (36.8 C) (Oral)    Resp 13    Ht 5\' 3"  (1.6 m)    Wt 74.8 kg    SpO2 100%    BMI 29.23 kg/m  SpO2: SpO2: 100 % O2 Device: O2 Device: Nasal Cannula O2 Flow Rate: O2 Flow Rate (L/min): 6 L/min   Palliative Assessment/Data: PPS 10-20%  Palliative Care Assessment & Plan   Assessment: - end stage COPD - acute on chronic respiratory failure with hypoxia - dyspnea - end of life care  Recommendations/Plan: Continue comfort measures Continue morphine infusion Referral made to [facility] per family preference PRN medications are available for symptom management at EOL  Symptom Management:  Lorazepam (ATIVAN) prn for anxiety Haloperidol (HALDOL) prn for agitation  Glycopyrrolate (ROBINUL) for excessive secretions Ondansetron (ZOFRAN) prn for nausea Polyvinyl alcohol (LIQUIFILM TEARS) prn for dry eyes Antiseptic oral rinse (BIOTENE) prn for dry mouth  Code Status: DNR/DNI as previously documented  Prognosis:  < 2 weeks  Discharge Planning: Hospice facility    Thank you for allowing the Palliative Medicine Team to assist in the care of this patient.  MDM - High   Lavena Bullion, NP  Please contact Palliative Medicine Team phone at [phone] for questions and concerns.

## 2021-10-31 NOTE — ED Notes (Signed)
Palliative at bedside

## 2021-10-31 NOTE — Progress Notes (Signed)
[facility] ED 043 - [facility]) Hospitalized Hospice Patient   Ms. Holness has been an [facility] hospice homecare patient with a terminal hospice diagnosis of COPD since June 2022.    Patient husband notified [facility] RN via telephone at 0100 on 10/30/21 that the family had initiated EMS who was currently in the home preparing to transport the patient to [facility] ED for evaluation of respiratory distress. Husband stated that the patient was experiencing restlessness/difficulty breathing and despite multiple doses of Roxanol and Xanax, he was unable to mitigate the symptoms and/or achieve respiratory comfort. Patient is being admitted to [facility] with SOB/end of life care. Per Dr. Lyman Speller, an [facility] physician, this is a related hospital admission.   Visited at bedside with husband and daughters present. Spoke with RN.  Patient appears comfortable on a morphine drip.   Patient is appropriate for inpatient status due to symptom manangment and pending transfer to [facility].   VS: 82/60, 89, 13, 100% 6Lnc I/O: _ /3500  No new labs or diagnostics today.  Problem list per EPIC MD Notes: -Patient with known End-stage COPD presenting with recurrent acute on chronic hypoxic and hypercapnic respiratory failure -Patient has been on home hospice -She was recently admitted with acute on chronic respiratory failure that was multifactorial with decreasing times between hospitalizations -Her husband recognizes that she is unlikely to have meaningful recovery and that she is suffering and requests end of life care at this time -She was started on BIPAP on presentation but is currently on [place] O2 -Hospice is aware of admission and will follow along during hospitalization -She is admitted to [facility] for comfort care and palliative care consult -Patient may be a candidate for [facility] or other residential hospice, but is more likely to have an in-hospital death -Comfort care order set utilized -Pain control with  morphine drip  Discharge planning:  Transfer to [facility] 2/23 for end of life care.  Family contact: at bedside  IDG: updated  GOC:  Comfort care only at [facility]  Please use [facility] as [facility] contracts this service with them for our active hospice patients  A Please do not hesitate to call with questions.   Thank you,   Farrel Gordon, RN, [facility] Liaison   [phone]

## 2021-11-01 NOTE — TOC Transition Note (Signed)
Transition of Care [facility]) - CM/SW Discharge Note   Patient Details  Name: [name] MRN: [id number] Date of Birth: [birth year]  Transition of Care [facility]) CM/SW Contact:  Lorri Frederick, LCSW Phone Number: 11/01/2021, 10:39 AM   Clinical Narrative:   Pt discharging to [facility].  RN call report to [phone].  [facility] transport will arrive after 12noon, per [facility] request.      Final next level of care: Hospice Medical Facility Barriers to Discharge: No Barriers Identified   Patient Goals and CMS Choice   CMS Medicare.gov Compare Post Acute Care list provided to::  (NA-current [facility] pt, wants to continue)    Discharge Placement              Patient chooses bed at:  [facility]) Patient to be transferred to facility by: [facility] Name of family member notified: husband [name] Patient and family notified of of transfer: 11/01/21  Discharge Plan and Services In-house Referral: Clinical Social Work   Post Acute Care Choice: Hospice                               Social Determinants of Health (SDOH) Interventions     Readmission Risk Interventions Readmission Risk Prevention Plan 02/26/2021  Transportation Screening Complete  PCP or Specialist Appt within 3-5 Days Complete  HRI or Home Care Consult Complete  Social Work Consult for Recovery Care Planning/Counseling Complete  Palliative Care Screening Complete  Medication Review [employer]) Referral to Pharmacy  Some recent data might be hidden

## 2021-11-01 NOTE — Discharge Summary (Signed)
Physician Discharge Summary   Patient: [name] MRN: [id number] DOB: 25-Dec-1965  Admit date:     10/30/2021  Discharge date: 11/01/21  Discharge Physician: Little Ishikawa   PCP: Chesley Noon, MD   Discharge Diagnoses: Principal Problem:   Admission for end of life care  Resolved Problems:   * No resolved hospital problems. *   Hospital Course: No notes on file  Assessment and Plan: * Admission for end of life care -Patient with known End-stage COPD presenting with recurrent acute on chronic hypoxic and hypercapnic respiratory failure -Patient has been on home hospice -She was recently admitted with acute on chronic respiratory failure that was multifactorial with decreasing times between hospitalizations -Her husband recognizes that she is unlikely to have meaningful recovery and that she is suffering and requests end of life care at this time -She was started on BIPAP on presentation but is currently on [place] O2 -Hospice is aware of admission and will follow along during hospitalization -She is admitted to [facility] for comfort care and palliative care consult -Patient may be a candidate for [facility] or other residential hospice, but is more likely to have an in-hospital death -Comfort care order set utilized -Pain control with morphine drip   Transition to hospice house 11/01/21 once transport/bed are available.       Consultants: Palliative Procedures performed: None  Disposition: Hospice care Diet recommendation:  Regular diet  DISCHARGE MEDICATION: Allergies as of 11/01/2021       Reactions   Amoxicillin-pot Clavulanate Itching   Ok with benadryl Did it involve swelling of the face/tongue/throat, SOB, or low BP? No Did it involve sudden or severe rash/hives, skin peeling, or any reaction on the inside of your mouth or nose? No Did you need to seek medical attention at a hospital or doctor's office? No When did it last happen?      6 months If all  above answers are NO, may proceed with cephalosporin use.   Erythromycin Rash, Other (See Comments)   Ok with benadryl    Moxifloxacin Itching, Other (See Comments)   Reaction to Avelox - ok with benadryl   Sulfamethoxazole-trimethoprim Itching, Other (See Comments)   Ok with benadryl        Medication List     STOP taking these medications    albuterol (2.5 MG/3ML) 0.083% nebulizer solution Commonly known as: PROVENTIL   cetirizine 10 MG tablet Commonly known as: ZYRTEC   Diflucan 100 MG tablet Generic drug: fluconazole   DULoxetine 30 MG capsule Commonly known as: CYMBALTA   fluticasone 50 MCG/ACT nasal spray Commonly known as: FLONASE   furosemide 20 MG tablet Commonly known as: Lasix   gabapentin 400 MG capsule Commonly known as: NEURONTIN   haloperidol 5 MG tablet Commonly known as: HALDOL   HYDROcodone-acetaminophen 10-325 MG tablet Commonly known as: NORCO   ipratropium-albuterol 0.5-2.5 (3) MG/3ML Soln Commonly known as: DUONEB   mometasone-formoterol 200-5 MCG/ACT Aero Commonly known as: DULERA   montelukast 10 MG tablet Commonly known as: SINGULAIR   morphine 30 MG 12 hr tablet Commonly known as: MS CONTIN   morphine CONCENTRATE 10 mg / 0.5 ml concentrated solution   nystatin-triamcinolone ointment Commonly known as: MYCOLOG   OXYGEN   pantoprazole 40 MG tablet Commonly known as: PROTONIX   polyethylene glycol powder 17 GM/SCOOP powder Commonly known as: GLYCOLAX/MIRALAX   predniSONE 20 MG tablet Commonly known as: DELTASONE   Santyl ointment Generic drug: collagenase   Vitamin D (  Ergocalciferol) 1.25 MG (50000 UNIT) Caps capsule Commonly known as: DRISDOL         Discharge Exam: Filed Weights   10/30/21 1409  Weight: 74.8 kg    Condition at discharge: poor  The results of significant diagnostics from this hospitalization (including imaging, microbiology, ancillary and laboratory) are listed below for reference.    Imaging Studies: DG Chest Port 1 View  Result Date: 10/30/2021 CLINICAL DATA:  Respiratory distress EXAM: PORTABLE CHEST 1 VIEW COMPARISON:  10/11/2021 FINDINGS: Cardiac shadow is prominent but stable. Tortuous thoracic aorta is noted. Increased vascular congestion is noted with mild interstitial edema. Bibasilar atelectasis is noted increased from the prior exam. IMPRESSION: Bibasilar atelectasis.  Increasing vascular congestion with edema. Electronically Signed   By: Inez Catalina M.D.   On: 10/30/2021 02:14   DG Chest Port 1 View  Result Date: 10/11/2021 CLINICAL DATA:  sob EXAM: PORTABLE CHEST 1 VIEW COMPARISON:  09/19/2021. FINDINGS: Bibasilar right greater than left opacities. Possible small right pleural effusion. No visible pneumothorax. Cardiomediastinal silhouette is similar to prior and mildly enlarged. ACDF. IMPRESSION: 1. Bibasilar right greater than left opacities, which could represent atelectasis, aspiration, and/or pneumonia. 2. Possible small right pleural effusion. Electronically Signed   By: Margaretha Sheffield M.D.   On: 10/11/2021 12:40    Microbiology: Results for orders placed or performed during the hospital encounter of 10/30/21  Resp Panel by RT-PCR (Flu A&B, Covid) Nasopharyngeal Swab     Status: None   Collection Time: 10/30/21  2:13 AM   Specimen: Nasopharyngeal Swab; Nasopharyngeal(NP) swabs in vial transport medium  Result Value Ref Range Status   SARS Coronavirus 2 by RT PCR NEGATIVE NEGATIVE Final    Comment: (NOTE) SARS-CoV-2 target nucleic acids are NOT DETECTED.  The SARS-CoV-2 RNA is generally detectable in upper respiratory specimens during the acute phase of infection. The lowest concentration of SARS-CoV-2 viral copies this assay can detect is 138 copies/mL. A negative result does not preclude SARS-Cov-2 infection and should not be used as the sole basis for treatment or other patient management decisions. A negative result may occur with  improper  specimen collection/handling, submission of specimen other than nasopharyngeal swab, presence of viral mutation(s) within the areas targeted by this assay, and inadequate number of viral copies(<138 copies/mL). A negative result must be combined with clinical observations, patient history, and epidemiological information. The expected result is Negative.  Fact Sheet for Patients:  EntrepreneurPulse.com.au  Fact Sheet for Healthcare Providers:  IncredibleEmployment.be  This test is no t yet approved or cleared by the [place] FDA and  has been authorized for detection and/or diagnosis of SARS-CoV-2 by FDA under an Emergency Use Authorization (EUA). This EUA will remain  in effect (meaning this test can be used) for the duration of the COVID-19 declaration under Section 564(b)(1) of the Act, 21 U.S.C.section 360bbb-3(b)(1), unless the authorization is terminated  or revoked sooner.       Influenza A by PCR NEGATIVE NEGATIVE Final   Influenza B by PCR NEGATIVE NEGATIVE Final    Comment: (NOTE) The Xpert Xpress SARS-CoV-2/FLU/RSV plus assay is intended as an aid in the diagnosis of influenza from Nasopharyngeal swab specimens and should not be used as a sole basis for treatment. Nasal washings and aspirates are unacceptable for Xpert Xpress SARS-CoV-2/FLU/RSV testing.  Fact Sheet for Patients: EntrepreneurPulse.com.au  Fact Sheet for Healthcare Providers: IncredibleEmployment.be  This test is not yet approved or cleared by the [place] FDA and has been authorized for detection  and/or diagnosis of SARS-CoV-2 by FDA under an Emergency Use Authorization (EUA). This EUA will remain in effect (meaning this test can be used) for the duration of the COVID-19 declaration under Section 564(b)(1) of the Act, 21 U.S.C. section 360bbb-3(b)(1), unless the authorization is terminated or revoked.  Performed at  [facility], [place] [address], [place], [place] [address]     Labs: CBC: Recent Labs  Lab 10/30/21 0206 10/30/21 0213  WBC  --  18.4*  NEUTROABS  --  15.4*  HGB 9.5* 8.1*  HCT 28.0* 30.2*  MCV  --  89.3  PLT  --  0000000   Basic Metabolic Panel: Recent Labs  Lab 10/30/21 0206 10/30/21 0213  NA 137 139  K 3.5 3.6  CL  --  87*  CO2  --  44*  GLUCOSE  --  105*  BUN  --  8  CREATININE  --  0.56  CALCIUM  --  8.5*   Liver Function Tests: No results for input(s): AST, ALT, ALKPHOS, BILITOT, PROT, ALBUMIN in the last 168 hours. CBG: No results for input(s): GLUCAP in the last 168 hours.  Discharge time spent: greater than 30 minutes.  Signed: Little Ishikawa, MD [facility] 11/01/2021

## 2021-11-01 NOTE — TOC Initial Note (Signed)
Transition of Care [facility]) - Initial/Assessment Note    Patient Details  Name: [name] MRN: [id number] Date of Birth: August 28, 1966  Transition [facility]) CM/SW Contact:    Joanne Chars, LCSW Phone Number: 11/01/2021, 10:13 AM  Clinical Narrative:   CSW received message from [facility] that pt can be transferred to [facility] place for residential hospice services.  CSW met with husband [name] and daughter in room, they confirmed that pt is current [facility] home hospice pt and they are now planning for admission to [facility].  Per Bevely Palmer at [facility], pt can transfer after 12noon.                Expected Discharge Plan: [place] Barriers to Discharge: No Barriers Identified   Patient Goals and CMS Choice   CMS Medicare.gov Compare Post Acute Care list provided to::  (NA-current [facility] pt, wants to continue)    Expected Discharge Plan and Services Expected Discharge Plan: [place] In-house Referral: Clinical Social Work   Post Acute Care Choice: Hospice Living arrangements for the past 2 months: [place] Expected Discharge Date: 11/01/21                                    Prior Living Arrangements/Services Living arrangements for the past 2 months: Single Family Home Lives with:: Spouse Patient language and need for interpreter reviewed:: No        Need for Family Participation in Patient Care: Yes (Comment) Care giver support system in place?: Yes (comment) Current home services: Hospice Criminal Activity/Legal Involvement Pertinent to Current Situation/Hospitalization: No - Comment as needed  Activities of Daily Living Home Assistive Devices/Equipment: Other (Comment) ADL Screening (condition at time of admission) Patient's cognitive ability adequate to safely complete daily activities?: No Is the patient deaf or have difficulty hearing?: No Does the patient have difficulty seeing, even when  wearing glasses/contacts?: No Does the patient have difficulty concentrating, remembering, or making decisions?: Yes Patient able to express need for assistance with ADLs?: No Does the patient have difficulty dressing or bathing?: Yes Independently performs ADLs?: No Communication: Dependent Is this a change from baseline?: Pre-admission baseline Dressing (OT): Dependent Is this a change from baseline?: Pre-admission baseline Grooming: Dependent Is this a change from baseline?: Pre-admission baseline Feeding: Dependent Is this a change from baseline?: Pre-admission baseline Bathing: Dependent Is this a change from baseline?: Pre-admission baseline Is this a change from baseline?: Pre-admission baseline In/Out Bed: Dependent Is this a change from baseline?: Pre-admission baseline Walks in Home: Dependent Is this a change from baseline?: Pre-admission baseline Does the patient have difficulty walking or climbing stairs?: No Weakness of Legs: Both Weakness of Arms/Hands: Both  Permission Sought/Granted                  Emotional Assessment Appearance:: Appears stated age Attitude/Demeanor/Rapport: Unable to Assess Affect (typically observed): Unable to Assess   Alcohol / Substance Use: Not Applicable Psych Involvement: No (comment)  Admission diagnosis:  Admission for end of life care [Z51.5] Acute respiratory failure with hypoxia ([place]) [J96.01] Acute on chronic respiratory failure with hypoxia and hypercapnia (H[facility]) [F35.45, J96.22] Patient Active Problem List   Diagnosis Date Noted   Acute respiratory failure with hypoxia ([place]) 10/30/2021   Admission for end of life care 10/30/2021   Normocytic anemia 10/12/2021   End stage COPD ([place]) 10/11/2021   Chronic diastolic CHF (  congestive heart failure) ([place]) 10/11/2021   Acute exacerbation of chronic obstructive pulmonary disease (COPD) ([place]) 41/58/3094   Acute metabolic encephalopathy 07/68/0881   Goals of care,  counseling/discussion 02/25/2021   Wound infection 02/25/2021   Acute on chronic respiratory failure with hypoxia and hypercapnia ([place]) 02/16/2021   New onset headache 12/14/2020   DNR (do not resuscitate) 11/13/2020   Acute respiratory failure ([place])    Respiratory distress    COVID-19 11/11/2020   Severe aortic stenosis 09/07/2020   Acute on chronic heart failure with preserved ejection fraction (HFpEF) ([place]) 09/07/2020   Sleep apnea    Osteoarthritis of left hip 02/18/2019   Chronic respiratory failure with hypoxia ([place]) 05/20/2018   Chronic pain disorder 05/20/2018   Tobacco abuse 05/20/2018   Essential hypertension    GERD (gastroesophageal reflux disease)    COPD (chronic obstructive pulmonary disease) ([place])    Asthma    Obesity due to excess calories    Lumbar spinal stenosis 04/03/2012    Class: Diagnosis of   COPD exacerbation ([place]) 05/31/2010   PCP:  Chesley Noon, MD Pharmacy:   [facility] [place], [place] - [address] [place] [address] Phone: [phone] Fax: [phone]  [facility] [address] [place] [address] Phone: [phone] Fax: [phone]     Social Determinants of Health (SDOH) Interventions    Readmission Risk Interventions Readmission Risk Prevention Plan 02/26/2021  Transportation Screening Complete  PCP or Specialist Appt within 3-5 Days Complete  HRI or [place] Complete  Social Work Consult for [place] Planning/Counseling Complete  Palliative Care Screening Complete  Medication Review Press photographer) Referral to Pharmacy  Some recent data might be hidden

## 2021-11-01 NOTE — Progress Notes (Signed)
Civil engineer, contracting [facility]) Hospital Liaison note.     This patient is approved to transfer to [facility] today.  Please arrange transport with [facility] for patient to arrive at [facility] after 12 (noon) today.  Please leave IV access in place.  RN please call report to [phone].    A Please do not hesitate to call with questions.     Thank you,     Elsie Saas, RN, [facility]       [facility] Liaison  [phone]

## 2021-11-01 NOTE — Plan of Care (Signed)

## 2021-11-07 DEATH — deceased

## 2022-03-12 IMAGING — DX DG CHEST 1V PORT
1 series · 1 of 1 positions shown · non-contrast
Comparison: 10/11/2021

CLINICAL DATA: Respiratory distress

EXAM:
PORTABLE CHEST 1 VIEW

[chest]
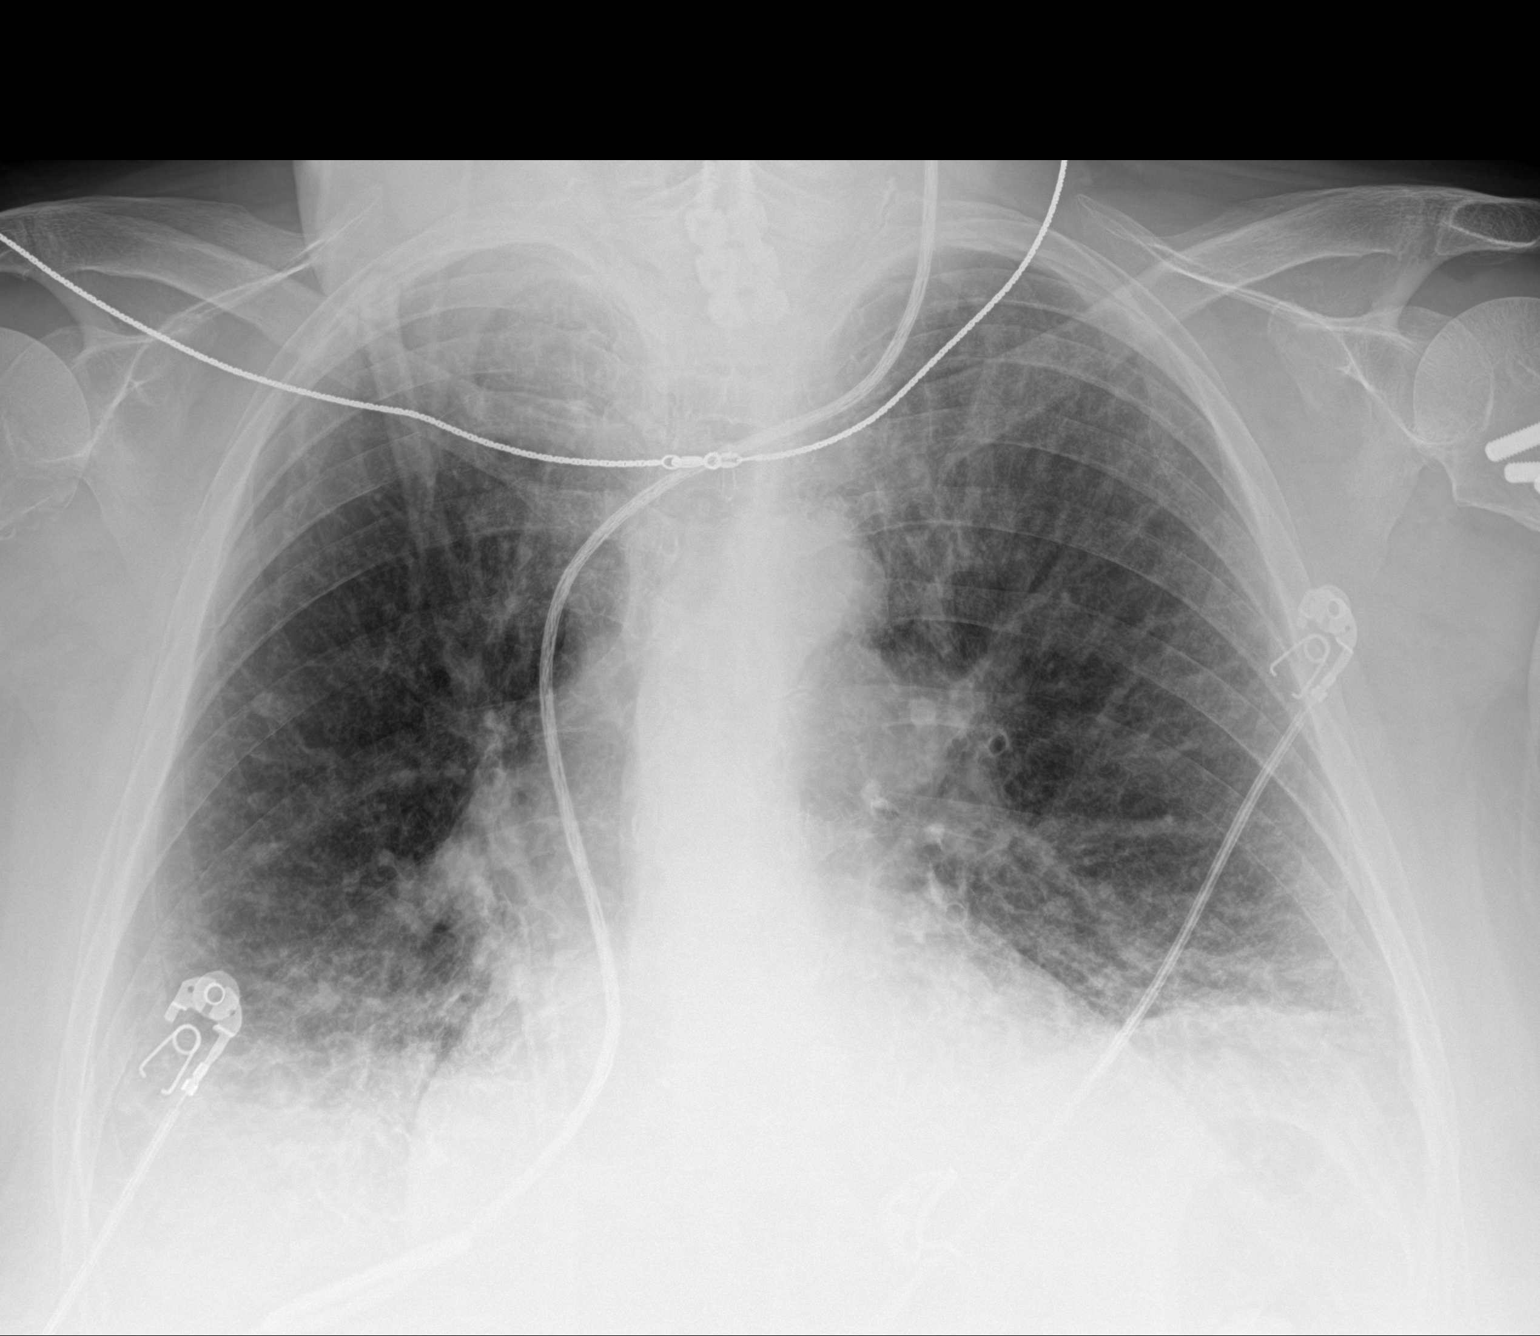

[1 of 1 positions shown; findings below may reference images not displayed]

FINDINGS: Cardiac shadow is prominent but stable. Tortuous thoracic aorta is
noted. Increased vascular congestion is noted with mild interstitial
edema. Bibasilar atelectasis is noted increased from the prior exam.
IMPRESSION: Bibasilar atelectasis.  Increasing vascular congestion with edema.
